# Patient Record
Sex: Female | Born: 1950 | Race: White | Hispanic: No | Marital: Single | Smoking: Never smoker
Health system: Southern US, Community
[De-identification: ages and names within clinical notes are randomized; demographics above are authoritative.]

## PROBLEM LIST (undated history)

## (undated) DIAGNOSIS — E669 Obesity, unspecified: Secondary | ICD-10-CM

## (undated) DIAGNOSIS — E785 Hyperlipidemia, unspecified: Secondary | ICD-10-CM

## (undated) DIAGNOSIS — I10 Essential (primary) hypertension: Secondary | ICD-10-CM

## (undated) DIAGNOSIS — M1990 Unspecified osteoarthritis, unspecified site: Secondary | ICD-10-CM

## (undated) DIAGNOSIS — H269 Unspecified cataract: Secondary | ICD-10-CM

## (undated) DIAGNOSIS — T7840XA Allergy, unspecified, initial encounter: Secondary | ICD-10-CM

## (undated) DIAGNOSIS — K5792 Diverticulitis of intestine, part unspecified, without perforation or abscess without bleeding: Secondary | ICD-10-CM

## (undated) DIAGNOSIS — E079 Disorder of thyroid, unspecified: Secondary | ICD-10-CM

## (undated) HISTORY — DX: Diverticulitis of intestine, part unspecified, without perforation or abscess without bleeding: K57.92

## (undated) HISTORY — PX: BREAST BIOPSY: SHX20

## (undated) HISTORY — DX: Essential (primary) hypertension: I10

## (undated) HISTORY — DX: Disorder of thyroid, unspecified: E07.9

## (undated) HISTORY — DX: Unspecified osteoarthritis, unspecified site: M19.90

## (undated) HISTORY — DX: Obesity, unspecified: E66.9

## (undated) HISTORY — DX: Allergy, unspecified, initial encounter: T78.40XA

## (undated) HISTORY — DX: Unspecified cataract: H26.9

## (undated) HISTORY — DX: Hyperlipidemia, unspecified: E78.5

---

## 1986-11-22 HISTORY — PX: BRAIN SURGERY: SHX531

## 2002-11-22 HISTORY — PX: FOOT SURGERY: SHX648

## 2005-11-22 HISTORY — PX: BIOPSY BREAST: PRO8

## 2006-11-22 HISTORY — PX: ABDOMINAL HYSTERECTOMY: SHX81

## 2009-04-01 ENCOUNTER — Ambulatory Visit: Payer: Self-pay | Admitting: Family Medicine

## 2009-04-01 DIAGNOSIS — E049 Nontoxic goiter, unspecified: Secondary | ICD-10-CM | POA: Insufficient documentation

## 2009-04-01 DIAGNOSIS — N1831 Chronic kidney disease, stage 3a: Secondary | ICD-10-CM | POA: Insufficient documentation

## 2009-04-01 DIAGNOSIS — E119 Type 2 diabetes mellitus without complications: Secondary | ICD-10-CM | POA: Insufficient documentation

## 2009-04-01 LAB — CONVERTED CEMR LAB
Albumin/Creatinine Ratio, Urine, POC: <30
Bilirubin Urine: NEGATIVE
Creatinine,U: 100 mg/dL
Microalbumin U total vol: 10 mg/L
Urobilinogen, UA: 0.2
WBC Urine, dipstick: NEGATIVE

## 2009-04-02 ENCOUNTER — Encounter: Payer: Self-pay | Admitting: Family Medicine

## 2009-04-02 LAB — CONVERTED CEMR LAB
ALT: 23 units/L (ref 0–35)
AST: 20 units/L (ref 0–37)
Albumin: 4.3 g/dL (ref 3.5–5.2)
Alkaline Phosphatase: 106 units/L (ref 39–117)
BUN: 16 mg/dL (ref 6–23)
CO2: 25 meq/L (ref 19–32)
Calcium: 9.1 mg/dL (ref 8.4–10.5)
Chloride: 104 meq/L (ref 96–112)
Cholesterol: 145 mg/dL (ref 0–200)
Creatinine, Ser: 0.63 mg/dL (ref 0.40–1.20)
Glucose, Bld: 103 mg/dL — ABNORMAL HIGH (ref 70–99)
HDL: 61 mg/dL (ref >39)
Hgb A1c MFr Bld: 6.1 % (ref 4.6–6.1)
LDL Cholesterol: 68 mg/dL (ref 0–99)
Potassium: 4.4 meq/L (ref 3.5–5.3)
Sodium: 140 meq/L (ref 135–145)
TSH: 1.604 microintl units/mL (ref 0.350–4.500)
Total Bilirubin: 0.5 mg/dL (ref 0.3–1.2)
Total CHOL/HDL Ratio: 2.4
Total Protein: 6.7 g/dL (ref 6.0–8.3)
Triglycerides: 80 mg/dL (ref <150)
VLDL: 16 mg/dL (ref 0–40)

## 2009-04-03 ENCOUNTER — Telehealth: Payer: Self-pay | Admitting: Family Medicine

## 2009-05-06 ENCOUNTER — Encounter: Admission: RE | Admit: 2009-05-06 | Discharge: 2009-05-06 | Payer: Self-pay | Admitting: Family Medicine

## 2009-10-01 ENCOUNTER — Ambulatory Visit: Payer: Self-pay | Admitting: Family Medicine

## 2009-12-03 ENCOUNTER — Ambulatory Visit: Payer: Self-pay | Admitting: Family Medicine

## 2009-12-03 DIAGNOSIS — M76899 Other specified enthesopathies of unspecified lower limb, excluding foot: Secondary | ICD-10-CM | POA: Insufficient documentation

## 2009-12-03 LAB — CONVERTED CEMR LAB: Hgb A1c MFr Bld: 6.4 %

## 2009-12-04 LAB — CONVERTED CEMR LAB: TSH: 1.093 microintl units/mL (ref 0.350–4.500)

## 2010-02-04 ENCOUNTER — Encounter: Payer: Self-pay | Admitting: Family Medicine

## 2010-04-22 ENCOUNTER — Ambulatory Visit: Payer: Self-pay | Admitting: Family Medicine

## 2010-04-22 DIAGNOSIS — Z780 Asymptomatic menopausal state: Secondary | ICD-10-CM | POA: Insufficient documentation

## 2010-04-23 ENCOUNTER — Encounter: Payer: Self-pay | Admitting: Family Medicine

## 2010-04-24 LAB — CONVERTED CEMR LAB
ALT: 19 units/L (ref 0–35)
AST: 18 units/L (ref 0–37)
Albumin: 4.4 g/dL (ref 3.5–5.2)
Alkaline Phosphatase: 103 units/L (ref 39–117)
BUN: 16 mg/dL (ref 6–23)
CO2: 26 meq/L (ref 19–32)
Calcium: 9.1 mg/dL (ref 8.4–10.5)
Chloride: 105 meq/L (ref 96–112)
Cholesterol: 157 mg/dL (ref 0–200)
Creatinine, Ser: 0.68 mg/dL (ref 0.40–1.20)
Creatinine, Urine: 117.9 mg/dL
Glucose, Bld: 120 mg/dL — ABNORMAL HIGH (ref 70–99)
HDL: 61 mg/dL (ref >39)
Hgb A1c MFr Bld: 6.5 % — ABNORMAL HIGH (ref <5.7)
LDL Cholesterol: 84 mg/dL (ref 0–99)
Microalb Creat Ratio: 7.4 mg/g (ref 0.0–30.0)
Microalb, Ur: 0.87 mg/dL (ref 0.00–1.89)
Potassium: 4.6 meq/L (ref 3.5–5.3)
Sodium: 141 meq/L (ref 135–145)
TSH: 2.207 microintl units/mL (ref 0.350–4.500)
Total Bilirubin: 0.3 mg/dL (ref 0.3–1.2)
Total CHOL/HDL Ratio: 2.6
Total Protein: 6.6 g/dL (ref 6.0–8.3)
Triglycerides: 60 mg/dL (ref <150)
VLDL: 12 mg/dL (ref 0–40)

## 2010-05-12 ENCOUNTER — Encounter: Payer: Self-pay | Admitting: Family Medicine

## 2010-05-14 ENCOUNTER — Telehealth: Payer: Self-pay | Admitting: Family Medicine

## 2010-05-15 ENCOUNTER — Ambulatory Visit: Payer: Self-pay | Admitting: Family Medicine

## 2010-05-19 ENCOUNTER — Encounter: Admission: RE | Admit: 2010-05-19 | Discharge: 2010-05-19 | Payer: Self-pay | Admitting: Family Medicine

## 2010-06-17 ENCOUNTER — Ambulatory Visit: Payer: Self-pay | Admitting: Family Medicine

## 2010-07-13 ENCOUNTER — Ambulatory Visit: Payer: Self-pay | Admitting: Family Medicine

## 2010-08-11 ENCOUNTER — Ambulatory Visit: Payer: Self-pay | Admitting: Family Medicine

## 2010-10-05 ENCOUNTER — Ambulatory Visit: Payer: Self-pay | Admitting: Family Medicine

## 2010-10-05 DIAGNOSIS — L723 Sebaceous cyst: Secondary | ICD-10-CM | POA: Insufficient documentation

## 2010-10-06 ENCOUNTER — Encounter: Payer: Self-pay | Admitting: Family Medicine

## 2010-10-27 ENCOUNTER — Ambulatory Visit: Payer: Self-pay | Admitting: Family Medicine

## 2010-11-09 ENCOUNTER — Ambulatory Visit: Payer: Self-pay | Admitting: Family Medicine

## 2010-12-10 ENCOUNTER — Telehealth: Payer: Self-pay | Admitting: Family Medicine

## 2010-12-17 ENCOUNTER — Ambulatory Visit
Admission: RE | Admit: 2010-12-17 | Discharge: 2010-12-17 | Payer: Self-pay | Source: Home / Self Care | Attending: Family Medicine | Admitting: Family Medicine

## 2010-12-17 DIAGNOSIS — I10 Essential (primary) hypertension: Secondary | ICD-10-CM | POA: Insufficient documentation

## 2010-12-17 DIAGNOSIS — M79609 Pain in unspecified limb: Secondary | ICD-10-CM | POA: Insufficient documentation

## 2010-12-17 LAB — CONVERTED CEMR LAB
Creatinine,U: 100 mg/dL
Hgb A1c MFr Bld: 6.5 %
Microalbumin U total vol: 10 mg/L

## 2010-12-22 NOTE — Letter (Signed)
Summary: [facility]   Imported By: Lanelle Bal 02/10/2010 09:01:15  _____________________________________________________________________  External Attachment:    Type:   Image     Comment:   External Document

## 2010-12-22 NOTE — Assessment & Plan Note (Signed)
Summary: WT check  Nurse Visit   Vital Signs:  Patient profile:   60 year old female Menstrual status:  hysterectomy 2008 Height:      65.25 inches Weight:      270 pounds BMI:     44.75 O2 Sat:      96 % on Room air Pulse rate:   72 / minute BP sitting:   137 / 74  (left arm) Cuff size:   large  Vitals Entered By: Payton Spark CMA (August 11, 2010 8:23 AM)  O2 Flow:  Room air CC: F/U weight and BP. Does not want phentermine refilled.   Current Medications (verified): 1)  Levothroid 100 Mcg Tabs (Levothyroxine Sodium) .Marland Kitchen.. 1 By Mouth Daily 2)  Simvastatin 20 Mg Tabs (Simvastatin) .Marland Kitchen.. 1 By Mouth Daily At Bedtime 3)  Asa 81mg  .... 1 By Mouth Daily 4)  Mv .Marland Kitchen.. 1 By Mouth Daily 5)  Calcium 600mg  .... 1 By Mouth Daily 6)  Fish Oil 1000mg  .... By Mouth Daily  Allergies (verified): No Known Drug Allergies  Orders Added: 1)  Est. Patient Level I [16109]    Impression & Recommendations:  Problem # 1:  DIABETES MELLITUS (ICD-250.00) Seconday to morbid obesity. BMI 44.  Problem # 2:  MORBID OBESITY (ICD-278.01) Total Phentermine use : 3 mos with 20 lbs lost during month 1, zero lbs lost month 2 and 5 lbs lost month 3. BP OK.  Pt did not wish to continue use.    Advise routine f/u with Dr Linford Arnold.  Complete Medication List: 1)  Levothroid 100 Mcg Tabs (Levothyroxine sodium) .Marland Kitchen.. 1 by mouth daily 2)  Simvastatin 20 Mg Tabs (Simvastatin) .Marland Kitchen.. 1 by mouth daily at bedtime 3)  Asa 81mg   .... 1 by mouth daily 4)  Mv  .Marland KitchenMarland Kitchen. 1 by mouth daily 5)  Calcium 600mg   .... 1 by mouth daily 6)  Fish Oil 1000mg   .... By mouth daily

## 2010-12-22 NOTE — Progress Notes (Signed)
Summary: normal stress echo  Phone Note Outgoing Call   Summary of Call: Marcelino Duster, Pls call pt.  Let her know that I reviewed her stress Echo from [facility] and it looks great.  Her pumping function is normal.  Valves all looked normla.  The only abnormality was some impaired relaxation of the left ventricle which is usually from HTN or Obesity.  She has the [place] light to exercise.  Our goals will be to work on improving weight and BP.   Initial call taken by: Seymour Bars DO,  May 14, 2010 7:57 AM  Follow-up for Phone Call        [facility] for [facility] Follow-up by: Payton Spark CMA,  May 14, 2010 12:44 PM     Appended Document: normal stress echo Pt aware

## 2010-12-22 NOTE — Assessment & Plan Note (Signed)
Summary: CPE w/o pap   Vital Signs:  Patient profile:   60 year old female Menstrual status:  hysterectomy 2008 Height:      65.25 inches Weight:      293 pounds BMI:     48.56 O2 Sat:      96 % on Room air Pulse rate:   76 / minute BP sitting:   133 / 84  (left arm) Cuff size:   large  Vitals Entered By: Payton Spark CMA (April 22, 2010 2:35 PM)  O2 Flow:  Room air CC: CPE w/ out pap   Primary Care Provider:  Nani Gasser, MD.   CC:  CPE w/ out pap.  History of Present Illness: 60 yo WF presents for CPE w/o pap smear.  She had a TAH at 60.  Her ovaries were removed.  Denies any vasomotor symptoms.  She had fibroids.    She is single and not sexually active.  Declined need for STD testing.  Her colonoscopy is UTD.  Her tetanus is UTD.  she is due for fasting labs along with TSH and A1C this month.  She has failed to lose wt on her own and is interested in starting a wt loss program.  Has mammogram scheduled later this month.  Last DEXA done 5 yrs ago.  Has never had cardiac testing done and has obesity, DM and high cholesterol.  Denies symptoms of CP or DOE.    Current Medications (verified): 1)  Levothroid 100 Mcg Tabs (Levothyroxine Sodium) .Marland Kitchen.. 1 By Mouth Daily 2)  Simvastatin 20 Mg Tabs (Simvastatin) .Marland Kitchen.. 1 By Mouth Daily At Bedtime 3)  Asa 81mg  .... 1 By Mouth Daily 4)  Mv .Marland Kitchen.. 1 By Mouth Daily 5)  Calcium 600mg  .... 1 By Mouth Daily 6)  Fish Oil 1000mg  .... By Mouth Daily  Allergies (verified): No Known Drug Allergies  Past History:  Past Medical History: Reviewed history from 04/01/2009 and no changes required. Goiter Diverticulitis DM since 2007  Past Surgical History: Reviewed history from 04/01/2009 and no changes required. Hysterectomy 2008- fibroids.  Left breast biopsy 2007 - benign calcification.  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 GM with  MI Mother with HTN, stroke, DM Father with kidney failure, CAD with CABG at 32  Social History: Reviewed history from 04/01/2009 and no changes required. 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   Review of Systems       The patient complains of weight gain.  The patient denies anorexia, fever, weight loss, vision loss, decreased hearing, hoarseness, chest pain, syncope, dyspnea on exertion, peripheral edema, prolonged cough, headaches, hemoptysis, abdominal pain, melena, hematochezia, severe indigestion/heartburn, hematuria, incontinence, genital sores, muscle weakness, suspicious skin lesions, transient blindness, difficulty walking, depression, unusual weight change, abnormal bleeding, enlarged lymph nodes, angioedema, breast masses, and testicular masses.    Physical Exam  General:  morbidly obese WF in NAD Head:  normocephalic and atraumatic.   Eyes:  pupils equal, pupils round, and pupils reactive to light.   Ears:  EACs patent; TMs translucent and gray with good cone of light and bony landmarks.  Nose:  no nasal discharge.   Mouth:  good dentition and pharynx pink and moist.   Neck:  no masses.   Breasts:  No mass, nodules, thickening, tenderness, bulging, retraction, inflamation, nipple discharge or skin  changes noted.   Lungs:  Normal respiratory effort, chest expands symmetrically. Lungs are clear to auscultation, no crackles or wheezes. Heart:  Normal rate and regular rhythm. S1 and S2 normal without gallop, murmur, click, rub or other extra sounds. Abdomen:  Bowel sounds positive,abdomen soft and non-tender without masses, organomegaly or hernias noted. Msk:  no joint tenderness, no joint swelling, and no joint warmth.   Pulses:  2+ radial and pedal pulses Extremities:  1+ lipidemia bilat LEs Skin:  color normal.   Cervical Nodes:  No lymphadenopathy noted Psych:  good eye  contact, not anxious appearing, and not depressed appearing.     Impression & Recommendations:  Problem # 1:  HEALTH MAINTENANCE EXAM (ICD-V70.0) Keeping healthy checklist for women reviewed. BP at goal.  BMI 48 c/w class III obesity. Update fasting labs. Update DEXA with mammogram this month. MVI + calcium/ D daily recommended along with healthy diet, regular exercise, wt loss. Set up Stress Echo given DM, morbid obesity, hyperlipidemia in the setting of initating wt loss program. Colonoscopy and Tetanus UTD. RTC for wt managment in 3 wks.  Complete Medication List: 1)  Levothroid 100 Mcg Tabs (Levothyroxine sodium) .Marland Kitchen.. 1 by mouth daily 2)  Simvastatin 20 Mg Tabs (Simvastatin) .Marland Kitchen.. 1 by mouth daily at bedtime 3)  Asa 81mg   .... 1 by mouth daily 4)  Mv  .Marland KitchenMarland Kitchen. 1 by mouth daily 5)  Calcium 600mg   .... 1 by mouth daily 6)  Fish Oil 1000mg   .... By mouth daily  Other Orders: T-Stress 2-D Echo (16109) T-Comprehensive Metabolic Panel [id number]) T-Lipid Profile ([id number]) T-TSH [id number]) T-Hemoglobin A1C (96295) T-Urine Microalbumin w/creat. ratio [phone]) T-DXA Bone Density/ Appendicular (53664) T-Dual DXA Bone Density/ Axial (40347)  Patient Instructions: 1)  Update fasting labs. 2)  Will call you with the results. 3)  Will schedule Stress Echo at [facility] cardiology. 4)  Return to see Dr Cathey Endow for Weight managment in 3 wks. Prescriptions: SIMVASTATIN 20 MG TABS (SIMVASTATIN) 1 by mouth daily at bedtime  #90 x 1   Entered and Authorized by:   Seymour Bars DO   Signed by:   Seymour Bars DO on 04/22/2010   Method used:   Electronically to        [facility] # [phone]* (retail)       [address]       [place], [place]  [address]       Ph: [phone]       Fax: [phone]   RxID:   [id number] LEVOTHROID 100 MCG TABS (LEVOTHYROXINE SODIUM) 1 by mouth daily  #90 x 1   Entered and Authorized by:   Seymour Bars DO   Signed by:    Seymour Bars DO on 04/22/2010   Method used:   Electronically to        [facility] # [phone]* (retail)       [address]       [place], [place]  [address]       Ph: [phone]       Fax: [phone]   RxID:   [id number]

## 2010-12-22 NOTE — Assessment & Plan Note (Signed)
Summary: f/u WT   Vital Signs:  Patient profile:   60 year old female Menstrual status:  hysterectomy 2008 Height:      65.25 inches Weight:      275 pounds BMI:     45.58 O2 Sat:      98 % on Room air Pulse rate:   75 / minute BP sitting:   138 / 75  (left arm) Cuff size:   large  Vitals Entered By: Payton Spark CMA (July 13, 2010 8:17 AM)  O2 Flow:  Room air CC: F/U weight and BP   Primary Care Provider:  Nani Gasser, MD.   CC:  F/U weight and BP.  History of Present Illness: 60 yo WF presents for f/u WT/ BP after the 2nd month on Phentermine.  The first month, she lost 20 lbs.  The 2nd month, she lost zero.    She denies adverse SE from the meds.  She still feels like it is helping curb her appetite but she notes not being 'as strict' with her diet and has not been exercising regularly.    Allergies: No Known Drug Allergies  Past History:  Past Medical History: Reviewed history from 06/17/2010 and no changes required. Goiter Diverticulitis DM since 2007 obesity (BMI >40) hyperlipidemia  Social History: Reviewed history from 04/01/2009 and no changes required. 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   Review of Systems      See HPI  Physical Exam  General:  alert, well-developed, well-nourished, and well-hydrated.  obese Psych:  good eye contact, not anxious appearing, and not depressed appearing.     Impression & Recommendations:  Problem # 1:  DIABETES MELLITUS (ICD-250.00)  Labs Reviewed: Creat: 0.68 (04/23/2010)   Microalbumin: 10 (04/01/2009)  Last Eye Exam: normal (02/04/2010) Reviewed HgBA1c results: 6.5 (04/23/2010)  6.4 (12/03/2009)  Problem # 2:  MORBID OBESITY (ICD-278.01) BMI 45.  On Phentermine x 2 months with 20 lbs lost the first month and zero for month 2. We discussed options to 1) stop Phentermine 2) retry for another month  along with using a food diary and sticking to 1400 kcal/ day or less with focus on veggies and lean proteins.  She is also to work on getting more exercise -- she is looking into [employer].  Needs 45-60 min 5 days/ wk.  Recheck in 1 month.  If < 4 lbs lost, will d/c Phentermine.  Complete Medication List: 1)  Levothroid 100 Mcg Tabs (Levothyroxine sodium) .Marland Kitchen.. 1 by mouth daily 2)  Simvastatin 20 Mg Tabs (Simvastatin) .Marland Kitchen.. 1 by mouth daily at bedtime 3)  Asa 81mg   .... 1 by mouth daily 4)  Mv  .Marland KitchenMarland Kitchen. 1 by mouth daily 5)  Calcium 600mg   .... 1 by mouth daily 6)  Fish Oil 1000mg   .... By mouth daily 7)  Phentermine Hcl 37.5 Mg Tabs (Phentermine hcl) .Marland Kitchen.. 1 tab by mouth qam, take 30 min before breakfast  Patient Instructions: 1)  Start a food diary. 2)  Aim for 1400 kcal/ day. 3)  Stick to high protein, low carb and plenty of veggies. 4)  Aim for 45+ min of exercise 5 days/ wk.   5)  Stay on Phenermine in the morning, 30 min before breakfast. 6)  REturn for a nurse visit in 1 month. Prescriptions: PHENTERMINE HCL 37.5 MG TABS (PHENTERMINE HCL) 1 tab by mouth qAM, take 30 min before breakfast  #  30 x 0   Entered and Authorized by:   Seymour Bars DO   Signed by:   Seymour Bars DO on 07/13/2010   Method used:   Print then Give to Patient   RxID:   [id number]

## 2010-12-22 NOTE — Assessment & Plan Note (Signed)
Summary: f/u WT   Vital Signs:  Patient profile:   60 year old female Menstrual status:  hysterectomy 2008 Height:      65.25 inches Weight:      275 pounds BMI:     45.58 O2 Sat:      97 % on Room air Pulse rate:   71 / minute BP sitting:   124 / 78  (left arm) Cuff size:   large  Vitals Entered By: Payton Spark CMA (June 17, 2010 8:47 AM)  O2 Flow:  Room air CC: F/U weight.    Primary Care Provider:  Nani Gasser, MD.   CC:  F/U weight. .  History of Present Illness: 60 yo WF presents for f/u weight management.  She was started Phentermine 1 month ago and has done well on it w/o palpitations, tremor, insomnia.  She is walking more and doing the exercise bike more often.  She notes that she has done better with portion control.  She has lost 21 lbs in 1 month.  Her co- morbidities include diet - controlled T2DM and hyperlipidemia.  Labs are UTD.  Due for an EKG today.      Current Medications (verified): 1)  Levothroid 100 Mcg Tabs (Levothyroxine Sodium) .Marland Kitchen.. 1 By Mouth Daily 2)  Simvastatin 20 Mg Tabs (Simvastatin) .Marland Kitchen.. 1 By Mouth Daily At Bedtime 3)  Asa 81mg  .... 1 By Mouth Daily 4)  Mv .Marland Kitchen.. 1 By Mouth Daily 5)  Calcium 600mg  .... 1 By Mouth Daily 6)  Fish Oil 1000mg  .... By Mouth Daily 7)  Phentermine Hcl 37.5 Mg Tabs (Phentermine Hcl) .Marland Kitchen.. 1 Tab By Mouth Qam, Take 30 Min Before Breakfast  Allergies (verified): No Known Drug Allergies  Past History:  Past Medical History: Goiter Diverticulitis DM since 2007 obesity (BMI >40) hyperlipidemia  Social History: Reviewed history from 04/01/2009 and no changes required. 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   Review of Systems      See HPI  Physical Exam  General:  alert, well-developed, well-nourished, and well-hydrated.  obese Head:  normocephalic and atraumatic.   Mouth:  pharynx pink and moist.     Neck:  no masses.   Lungs:  Normal respiratory effort, chest expands symmetrically. Lungs are clear to auscultation, no crackles or wheezes. Heart:  Normal rate and regular rhythm. S1 and S2 normal without gallop, murmur, click, rub or other extra sounds. Neurologic:  no tremor Skin:  color normal.   Cervical Nodes:  No lymphadenopathy noted Psych:  good eye contact, not anxious appearing, and not depressed appearing.     Impression & Recommendations:  Problem # 1:  MORBID OBESITY (ICD-278.01) BMI improved from 49--> 45.  Weight lost on first month of Phentermine: 21 lbs.  Doing great!  No adverse SE's.  EKG done today to check QT interval:  Normal QTc at 433 msec.  Normal axis with possible ectopic atrial rhythm.   May need cardiology referral down the road. BP / HR are normal.  Continue Phentermine for another month at same dose.   Continue health diet - high protein, low carb, increased veggies.  Will work on increasing her exercise to 40+ min 4-5 days/ wk.   RTC in 1 month for a BP/ WT check.    Problem # 2:  DIABETES MELLITUS (ICD-250.00)  Labs Reviewed: Creat: 0.68 (04/23/2010)   Microalbumin: 10 (04/01/2009)  Last Eye Exam:  normal (02/04/2010) Reviewed HgBA1c results: 6.5 (04/23/2010)  6.4 (12/03/2009)  Complete Medication List: 1)  Levothroid 100 Mcg Tabs (Levothyroxine sodium) .Marland Kitchen.. 1 by mouth daily 2)  Simvastatin 20 Mg Tabs (Simvastatin) .Marland Kitchen.. 1 by mouth daily at bedtime 3)  Asa 81mg   .... 1 by mouth daily 4)  Mv  .Marland KitchenMarland Kitchen. 1 by mouth daily 5)  Calcium 600mg   .... 1 by mouth daily 6)  Fish Oil 1000mg   .... By mouth daily 7)  Phentermine Hcl 37.5 Mg Tabs (Phentermine hcl) .Marland Kitchen.. 1 tab by mouth qam, take 30 min before breakfast  Other Orders: EKG w/ Interpretation (93000)  Patient Instructions: 1)  Return in 1 month for a nurse visit - BP/ WT check. Prescriptions: PHENTERMINE HCL 37.5 MG TABS (PHENTERMINE HCL) 1 tab by mouth qAM, take 30 min before breakfast  #30 x 0    Entered and Authorized by:   Seymour Bars DO   Signed by:   Seymour Bars DO on 06/17/2010   Method used:   Printed then faxed to ...       [facility]  [address] # [phone]* (retail)       [phone]       [place], [place]  [address]       Ph: [phone]       Fax: [phone]   RxID:   [id number]

## 2010-12-22 NOTE — Assessment & Plan Note (Signed)
Summary: Cyst on her back - jr   Vital Signs:  Patient profile:   60 year old female Menstrual status:  hysterectomy 2008 Height:      65.25 inches Weight:      277 pounds Pulse rate:   65 / minute BP sitting:   143 / 79  (right arm) Cuff size:   large  Vitals Entered By: Avon Gully CMA, Duncan Dull) (October 27, 2010 1:55 PM) CC: Cyst on the back to be removed   CC:  Cyst on the back to be removed.  Current Medications (verified): 1)  Levothroid 100 Mcg Tabs (Levothyroxine Sodium) .Marland Kitchen.. 1 By Mouth Daily 2)  Simvastatin 20 Mg Tabs (Simvastatin) .Marland Kitchen.. 1 By Mouth Daily At Bedtime 3)  Asa 81mg  .... 1 By Mouth Daily 4)  Mv .Marland Kitchen.. 1 By Mouth Daily 5)  Calcium 600mg  .... 1 By Mouth Daily 6)  Fish Oil 1000mg  .... By Mouth Daily  Allergies (verified): No Known Drug Allergies  Comments:  Nurse/Medical Assistant: The patient's medications and allergies were reviewed with the patient and were updated in the Medication and Allergy Lists. Avon Gully CMA, Duncan Dull) (October 27, 2010 1:56 PM)   Complete Medication List: 1)  Levothroid 100 Mcg Tabs (Levothyroxine sodium) .Marland Kitchen.. 1 by mouth daily 2)  Simvastatin 20 Mg Tabs (Simvastatin) .Marland Kitchen.. 1 by mouth daily at bedtime 3)  Asa 81mg   .... 1 by mouth daily 4)  Mv  .Marland KitchenMarland Kitchen. 1 by mouth daily 5)  Calcium 600mg   .... 1 by mouth daily 6)  Fish Oil 1000mg   .... By mouth daily  Other Orders: Excise lesion (TAL) 0.6 - 1.0 (11401)  Patient Instructions: 1)  Coat the wound with vaseline or neosporin before covering.  Followup in 2 weeks for suture removal.   2)  Call if any concerns for infection, redness or drainage.    Orders Added: 1)  Excise lesion (TAL) 0.6 - 1.0 [11401]    Procedure Note Last Tetanus: Historical (11/22/2006)  Cyst Removal: Indication: changing lesion  Procedure #1: elliptical incision and removal w/blunt dissection    Size (in cm): 1.0 x 1.0    Region: right upper back    Instrument used: #10 blade  Anesthesia: 1% lidocaine w/epinephrine    Closure: simple interrupted    Superficial Suture: 3-0 Ethilon       # of superficial sutures: 3  Cleaned and prepped with: alcohol and betadine Wound dressing: vaseline and bulky gauze dressing Instructions: RTC in 10-14 days  Biopsy:  Procedure # 1: shave biopsy    Size (in cm): 1.0 x 1.0    Region: right upper back    Comment: sebaceous cyst removal

## 2010-12-22 NOTE — Assessment & Plan Note (Signed)
Summary: Fu DM, leg pain, thyroid   Vital Signs:  Patient profile:   60 year old female Menstrual status:  hysterectomy 2008 Height:      65.25 inches Weight:      293 pounds Pulse rate:   69 / minute BP sitting:   134 / 73  (left arm) Cuff size:   large  Vitals Entered By: Kathlene November (December 03, 2009 9:45 AM) CC: follow-up blood sugars. FBS at home this morning was 124 Is Patient Diabetic? Yes   Primary Care Provider:  Nani Gasser, MD.   CC:  follow-up blood sugars. FBS at home this morning was 124.  History of Present Illness: follow-up blood sugars. FBS at home this morning was 124. Weight is up 5 pounds from last time. She is primarly diet controlled.    Numbness on the right outer thigh for severaly years but lately feel smore "creepy crawly" and then can be painful. Pain can last for several daysand then may go away. No aggrevating factrs.  Has been wearing the tone-up sketchers and occ wears merrels.  Occ uses Aleve - does help some.   Diabetes Management History:      The patient is a 60 years old female who comes in for evaluation of DM Type 2.  She says that she is not exercising regularly.        Frequency of hypoglycemic symptoms are reported to be seldom.  Other comments include: Occ will feel dizzy and then eat a peice of candy  and then will feel better.  .        There are no symptoms to suggest diabetic complications.  Since her last visit, no infections have occurred.  No changes have been made to her treatment plan since last visit.        Her home blood sugars include fasting blood sugars: highest: 150; 4:00 PM blood sugars: lowest: 109.    Habits & Providers  Exercise-Depression-Behavior     Type of exercise: None  Current Medications (verified): 1)  Levothroid 100 Mcg Tabs (Levothyroxine Sodium) .Marland Kitchen.. 1 By Mouth Daily 2)  Simvastatin 20 Mg Tabs (Simvastatin) .Marland Kitchen.. 1 By Mouth Daily At Bedtime 3)  Asa 81mg  .... 1 By Mouth Daily 4)  Mv .Marland Kitchen.. 1 By  Mouth Daily 5)  Calcium 600mg  .... 1 By Mouth Daily 6)  Fish Oil 1000mg  .... By Mouth Daily  Allergies (verified): No Known Drug Allergies  Comments:  Nurse/Medical Assistant: The patient's medications and allergies were reviewed with the patient and were updated in the Medication and Allergy Lists. Kathlene November (December 03, 2009 9:46 AM)  Physical Exam  General:  Well-developed,well-nourished,in no acute distress; alert,appropriate and cooperative throughout examination Lungs:  Normal respiratory effort, chest expands symmetrically. Lungs are clear to auscultation, no crackles or wheezes. Heart:  Normal rate and regular rhythm. S1 and S2 normal without gallop, murmur, click, rub or other extra sounds. Msk:  TEnder over teh left greater trochanter. weakness in teh adductor as well. Tender down the lateral thigh mostly closer to the hip.  Neurologic:  Patellar reflexes 2+_  Skin:  no rashes.   Psych:  Cognition and judgment appear intact. Alert and cooperative with normal attention span and concentration. No apparent delusions, illusions, hallucinations   Impression & Recommendations:  Problem # 1:  DIABETES MELLITUS (ICD-250.00)  A1C is still well controlled but up a little from last year. Discussed the importance of regular exercise and weight loss. She has gained 5 lbs  since last OV>  Reminded to get her eye exam this year.  F/ u in 6 months.  Will be due for repat labs at this time.   Orders: Fingerstick (36416) Hemoglobin A1C (83036)  Labs Reviewed: Creat: 0.63 (04/02/2009)   Microalbumin: 10 (04/01/2009) Reviewed HgBA1c results: 6.4 (12/03/2009)  6.1 (04/02/2009)  Problem # 2:  TROCHANTERIC BURSITIS, RIGHT (ICD-726.5) Assessment: New Discussed exercises to rehab the area for at least 3 weeks. If not helping then let me know. If needs to use a pain reliever can use her Aleve since it does seem to help. Make sure wearing supportive shoes.    Problem # 3:  HYPOTHYROIDISM  (ICD-244.9) Due to check today.  No complaints of symptoms of her thyroid.   Her updated medication list for this problem includes:    Levothroid 100 Mcg Tabs (Levothyroxine sodium) .Marland Kitchen... 1 by mouth daily  Orders: T-TSH ([id number])  Complete Medication List: 1)  Levothroid 100 Mcg Tabs (Levothyroxine sodium) .Marland Kitchen.. 1 by mouth daily 2)  Simvastatin 20 Mg Tabs (Simvastatin) .Marland Kitchen.. 1 by mouth daily at bedtime 3)  Asa 81mg   .... 1 by mouth daily 4)  Mv  .Marland KitchenMarland Kitchen. 1 by mouth daily 5)  Calcium 600mg   .... 1 by mouth daily 6)  Fish Oil 1000mg   .... By mouth daily  Laboratory Results   Blood Tests   Date/Time Received: 12/03/2009 Date/Time Reported: 12/03/2009  HGBA1C: 6.4%   (Normal Range: Non-Diabetic - 3-6%   Control Diabetic - 6-8%)        Appended Document: Fu DM, leg pain, thyroid    Diabetes Management History:      The patient is a 60 years old female who comes in for evaluation of DM Type 2.  She says that she is not exercising regularly.         Diabetes Management Exam:    Eye Exam:       Eye Exam done elsewhere          Date: 02/04/2010          Results: normal          Done by: [facility]

## 2010-12-22 NOTE — Assessment & Plan Note (Signed)
Summary: FLU SHOT  Nurse Visit  Flu Vaccine Consent Questions     Do you have a history of severe allergic reactions to this vaccine? no    Any prior history of allergic reactions to egg and/or gelatin? no    Do you have a sensitivity to the preservative Thimersol? no    Do you have a past history of Guillan-Barre Syndrome? no    Do you currently have an acute febrile illness? no    Have you ever had a severe reaction to latex? no    Vaccine information given and explained to patient? yes    Are you currently pregnant? no    Lot Number:[id number]   Exp Date:05/21/2010   Site Given  Left Deltoid IM  Allergies: No Known Drug Allergies  Orders Added: 1)  Admin 1st Vaccine [90471] 2)  Flu Vaccine 51yrs + [40981]

## 2010-12-22 NOTE — Assessment & Plan Note (Signed)
Summary: weight mgmt   Vital Signs:  Patient profile:   60 year old female Menstrual status:  hysterectomy 2008 Height:      65.25 inches Weight:      296 pounds BMI:     49.06 O2 Sat:      96 % on Room air Pulse rate:   64 / minute BP sitting:   134 / 71  (left arm) Cuff size:   large  Vitals Entered By: Payton Spark CMA (May 15, 2010 8:41 AM)  O2 Flow:  Room air CC: F/U weight management and Blood pressure.   Primary Care Provider:  Nani Gasser, MD.   CC:  F/U weight management and Blood pressure.Marland Kitchen  History of Present Illness: 60 yo WF present for f/u obesity.  She has co- morbididities of T2DM and hyperlipidemia.  She has likes sweets.  She does eat breakfast.  She is a late night snacker.  She feels hungry all the time.  She has tried [employer] and failed in the past.  She has failed to keep up with a walking schedule.    She is at her max weight. She gained 40 lbs with menopause. She works for [employer].  Stands a lot at work (works at [employer]).  She sleeps well at night.      Current Medications (verified): 1)  Levothroid 100 Mcg Tabs (Levothyroxine Sodium) .Marland Kitchen.. 1 By Mouth Daily 2)  Simvastatin 20 Mg Tabs (Simvastatin) .Marland Kitchen.. 1 By Mouth Daily At Bedtime 3)  Asa 81mg  .... 1 By Mouth Daily 4)  Mv .Marland Kitchen.. 1 By Mouth Daily 5)  Calcium 600mg  .... 1 By Mouth Daily 6)  Fish Oil 1000mg  .... By Mouth Daily  Allergies (verified): No Known Drug Allergies  Past History:  Past Medical History: Reviewed history from 04/01/2009 and no changes required. Goiter Diverticulitis DM since 2007  Social History: Reviewed history from 04/01/2009 and no changes required. 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   Review of Systems      See HPI  Physical Exam  General:  morbidly obese WF in NAD Skin:  color normal.     Impression & Recommendations:  Problem # 1:   MORBID OBESITY (ICD-278.01) BMI 49 c/w class III + obesity complicated by T2DM. Has failed to lose wt on weight watchers or fad diets.  H/O given from [facility] on healthy dietary guideline and I have given her exercise instructions.  Encouraged meal planning and a food diary to try to stay at 1600 kcal/day.  Due to large appetite and snacking, will start Phentermine each AM.  Discussed common SEs.  Call if any problems. BP/ HR normal and just had a normal stress echo.  Will f/u with me in 1 month for OV and to do an EKG.  Problem # 2:  DIABETES MELLITUS (ICD-250.00) Diet controlled. Labs Reviewed: Creat: 0.68 (04/23/2010)   Microalbumin: 10 (04/01/2009)  Last Eye Exam: normal (02/04/2010) Reviewed HgBA1c results: 6.5 (04/23/2010)  6.4 (12/03/2009)  Complete Medication List: 1)  Levothroid 100 Mcg Tabs (Levothyroxine sodium) .Marland Kitchen.. 1 by mouth daily 2)  Simvastatin 20 Mg Tabs (Simvastatin) .Marland Kitchen.. 1 by mouth daily at bedtime 3)  Asa 81mg   .... 1 by mouth daily 4)  Mv  .Marland KitchenMarland Kitchen. 1 by mouth daily 5)  Calcium 600mg   .... 1 by mouth daily 6)  Fish Oil 1000mg   .... By mouth daily 7)  Phentermine Hcl  37.5 Mg Tabs (Phentermine hcl) .Marland Kitchen.. 1 tab by mouth qam, take 30 min before breakfast  Patient Instructions: 1)  Start Phentermine each morning, 30 min before breakfast. 2)  Work on healthy diet  ~1600 cal/ day + 30+ min of exercise 5 days/ wk. 3)  Call if any problems. 4)  Return for follow up with me in 1 month, with EKG. Prescriptions: PHENTERMINE HCL 37.5 MG TABS (PHENTERMINE HCL) 1 tab by mouth qAM, take 30 min before breakfast  #30 x 0   Entered and Authorized by:   Seymour Bars DO   Signed by:   Seymour Bars DO on 05/15/2010   Method used:   Printed then faxed to ...       [facility]  [address] # [phone]* (retail)       [phone]       [place], [place]  [address]       Ph: [phone]       Fax: [phone]   RxID:   [id number]

## 2010-12-22 NOTE — Assessment & Plan Note (Signed)
Summary: SEbaceous cyst   Vital Signs:  Patient profile:   60 year old female Menstrual status:  hysterectomy 2008 Height:      65.25 inches Weight:      276 pounds Pulse rate:   67 / minute BP sitting:   144 / 76  (right arm) Cuff size:   large  Vitals Entered By: Avon Gully CMA, Duncan Dull) (October 05, 2010 10:06 AM) CC: possible cyst on the back, has been draining   Primary Care Provider:  Nani Gasser, MD.   CC:  possible cyst on the back and has been draining.  History of Present Illness: possible cyst on the back, has been draining. Says has beeen there for years and her sister keeps an eye on it. Says one day last week the area felt very sore and started draining spontaneously. She doesn't remeber any trauma. NO fevers.    Current Medications (verified): 1)  Levothroid 100 Mcg Tabs (Levothyroxine Sodium) .Marland Kitchen.. 1 By Mouth Daily 2)  Simvastatin 20 Mg Tabs (Simvastatin) .Marland Kitchen.. 1 By Mouth Daily At Bedtime 3)  Asa 81mg  .... 1 By Mouth Daily 4)  Mv .Marland Kitchen.. 1 By Mouth Daily 5)  Calcium 600mg  .... 1 By Mouth Daily 6)  Fish Oil 1000mg  .... By Mouth Daily  Allergies (verified): No Known Drug Allergies  Comments:  Nurse/Medical Assistant: The patient's medications and allergies were reviewed with the patient and were updated in the Medication and Allergy Lists. Avon Gully CMA, Duncan Dull) (October 05, 2010 10:07 AM)  Physical Exam  General:  Well-developed,well-nourished,in no acute distress; alert,appropriate and cooperative throughout examination Skin:  Sebeceous cyst on her right upper back.  No erythema around the lesion but it is drainaging yellow pus.  Swabbed and send for culture.    Impression & Recommendations:  Problem # 1:  SEBACEOUS CYST, INFECTED (ICD-706.2)  Will send a culture. IT is odd for it to spontaneously drian. discussed options for removal but will await culture. Will need course of ABX before removal if truly infected.  Will hold off on ABX  for now since no erythema around hte lesion.   Orders: T-Culture, Wound ([id number])  Complete Medication List: 1)  Levothroid 100 Mcg Tabs (Levothyroxine sodium) .Marland Kitchen.. 1 by mouth daily 2)  Simvastatin 20 Mg Tabs (Simvastatin) .Marland Kitchen.. 1 by mouth daily at bedtime 3)  Asa 81mg   .... 1 by mouth daily 4)  Mv  .Marland KitchenMarland Kitchen. 1 by mouth daily 5)  Calcium 600mg   .... 1 by mouth daily 6)  Fish Oil 1000mg   .... By mouth daily  Patient Instructions: 1)  We will call you with the culture results later this week. Please call sooner if sxs are worsening.  2)  Consider options for removal.    Orders Added: 1)  Est. Patient Level III [16109] 2)  T-Culture, Wound [[id number]]

## 2010-12-24 ENCOUNTER — Ambulatory Visit: Payer: Self-pay | Admitting: Family Medicine

## 2010-12-24 NOTE — Progress Notes (Signed)
Summary: Hgb A1C order  Phone Note Call from Patient Call back at Home Phone [phone]   Reason for Call: Lab or Test Results Summary of Call: pt needs orders for Hgb A1C, last checked in 04/2010.  Please call pt when ready. Initial call taken by: Francee Piccolo CMA Duncan Dull),  December 10, 2010 1:37 PM  Follow-up for Phone Call        REally needs an appt for diabetic f/u as we need check her feet, do a urine micro, etc. Tell her we usually do a finger stick for the A1C.  Follow-up by: Nani Gasser MD,  December 10, 2010 1:41 PM  Additional Follow-up for Phone Call Additional follow up Details #1::        left message for pt Additional Follow-up by: Avon Gully CMA, Duncan Dull),  December 10, 2010 2:06 PM

## 2010-12-24 NOTE — Assessment & Plan Note (Signed)
Summary: STITCHES REMOVED FROM BACK AREA HAD CYST REMOVED   Vital Signs:  Patient profile:   60 year old female Menstrual status:  hysterectomy 2008 Height:      65.25 inches Weight:      276 pounds Pulse rate:   54 / minute BP sitting:   134 / 78  (right arm) Cuff size:   large  Vitals Entered By: Avon Gully CMA, Duncan Dull) (November 09, 2010 10:51 AM) CC: suture removal  mid upper back   Primary Care Provider:  Nani Gasser, MD.   CC:  suture removal  mid upper back.  History of Present Illness: Healing well. No drainage. Has been itchy.  Has been getting her sister to dress it.     Current Medications (verified): 1)  Levothroid 100 Mcg Tabs (Levothyroxine Sodium) .Marland Kitchen.. 1 By Mouth Daily 2)  Simvastatin 20 Mg Tabs (Simvastatin) .Marland Kitchen.. 1 By Mouth Daily At Bedtime 3)  Asa 81mg  .... 1 By Mouth Daily 4)  Mv .Marland Kitchen.. 1 By Mouth Daily 5)  Calcium 600mg  .... 1 By Mouth Daily 6)  Fish Oil 1000mg  .... By Mouth Daily  Allergies (verified): No Known Drug Allergies  Comments:  Nurse/Medical Assistant: The patient's medications and allergies were reviewed with the patient and were updated in the Medication and Allergy Lists. Avon Gully CMA, Duncan Dull) (November 09, 2010 10:52 AM)  Physical Exam  General:  Well-developed,well-nourished,in no acute distress; alert,appropriate and cooperative throughout examination Skin:  Incision is healing very well. Some erythematous papules around the edce of the lesion where her bandadge.     Impression & Recommendations:  Problem # 1:  SEBACEOUS CYST, INFECTED (ICD-706.2)  Healing well. Avoid using her bandages since she is clearly having an allergic reaction.    Orders: No Charge Patient Arrived (NCPA0) (NCPA0)  Complete Medication List: 1)  Levothroid 100 Mcg Tabs (Levothyroxine sodium) .Marland Kitchen.. 1 by mouth daily 2)  Simvastatin 20 Mg Tabs (Simvastatin) .Marland Kitchen.. 1 by mouth daily at bedtime 3)  Asa 81mg   .... 1 by mouth daily 4)  Mv   .Marland KitchenMarland Kitchen. 1 by mouth daily 5)  Calcium 600mg   .... 1 by mouth daily 6)  Fish Oil 1000mg   .... By mouth daily  Patient Instructions: 1)   Call if have any problems.  2)  Avoid wearing  bandage since gettting an allergic reaction.    Orders Added: 1)  No Charge Patient Arrived (NCPA0) [NCPA0]

## 2010-12-24 NOTE — Assessment & Plan Note (Signed)
Summary: DM, HTN, foot pain   Vital Signs:  Patient profile:   60 year old female Menstrual status:  hysterectomy 2008 Height:      65.25 inches Weight:      282 pounds Pulse rate:   86 / minute BP sitting:   144 / 75  (right arm) Cuff size:   large  Vitals Entered By: Avon Gully CMA, Duncan Dull) (December 17, 2010 8:51 AM) CC: Diabetic work up   Primary Care Provider:  Nani Gasser, MD.   CC:  Diabetic work up.  History of Present Illness: Weight is up 6 lbs. Here to f/u DM. Not exercising regular. Admist her diet hasn't been great over the Holidays and with her shift work.   Left heel and medial ankle pain for 3 weeks. Started after she started wearing the shoes that are supposed to tone your legs. Says it starts in the heel and radiates to her medial malleolus. No trauma or known injury. No known old injuries. Has had plantar fascititis before.   Diabetes Management History:      The patient is a 60 years old female who comes in for evaluation of Type 2 Diabetes Mellitus.  She states understanding of dietary principles but she is not following the appropriate diet.  No sensory loss is reported.  Self foot exams are being performed.  She is checking home blood sugars.  She says that she is not exercising regularly.        Hypoglycemic symptoms are not occurring.  No hyperglycemic symptoms are reported.  Other comments include: Admists has slipped up with her diet. .        There are no symptoms to suggest diabetic complications.  Since her last visit, no infections have occurred.  No changes have been made to her treatment plan since last visit.    Current Medications (verified): 1)  Levothroid 100 Mcg Tabs (Levothyroxine Sodium) .Marland Kitchen.. 1 By Mouth Daily 2)  Simvastatin 20 Mg Tabs (Simvastatin) .Marland Kitchen.. 1 By Mouth Daily At Bedtime 3)  Asa 81mg  .... 1 By Mouth Daily 4)  Mv .Marland Kitchen.. 1 By Mouth Daily 5)  Calcium 600mg  .... 1 By Mouth Daily 6)  Fish Oil 1000mg  .... By Mouth  Daily  Allergies (verified): No Known Drug Allergies  Comments:  Nurse/Medical Assistant: The patient's medications and allergies were reviewed with the patient and were updated in the Medication and Allergy Lists. Avon Gully CMA, Duncan Dull) (December 17, 2010 8:52 AM)  Social History: Reviewed history from 04/01/2009 and no changes required. 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   Physical Exam  General:  Well-developed,well-nourished,in no acute distress; alert,appropriate and cooperative throughout examinationwell-nourished.   Lungs:  Normal respiratory effort, chest expands symmetrically. Lungs are clear to auscultation, no crackles or wheezes. Heart:  Normal rate and regular rhythm. S1 and S2 normal without gallop, murmur, click, rub or other extra sounds. Msk:  Left ankle with NROM. Sterngth 5/5.  She is nontender over her heel or medial malleolus. NO swelling or redness.  Great toe rom and strength is normal.  Extremities:  DP pulse on left is normal.  Skin:  no rashes.    Diabetes Management Exam:    Foot Exam (with socks and/or shoes not present):       Sensory-Monofilament:          Left foot: normal  Right foot: normal   Impression & Recommendations:  Problem # 1:  DIABETES MELLITUS (ICD-250.00) AT goal but she has gained weight. She says she is trying to get back to eating more healthy Her foot pain has kept her from exercises.  F/U in 3 months. Work on regular exercise.   Neg microalbumin. Reminded her due for eye exam IN March.  Orders: Fingerstick (16109) Creatinine  (60454) Hemoglobin A1C (83036) Urine Microalbumin (09811)  Problem # 2:  HYPERTENSION, BENIGN (ICD-401.1) Assessment: New Discussed new dx.  Start diuretic. Warned of potential SE.  F/U in 6 weeks. Check BMP at that time.  Her updated medication list for this problem includes:     Hydrochlorothiazide 25 Mg Tabs (Hydrochlorothiazide) .Marland Kitchen... Take 1 tablet by mouth once a day  Problem # 3:  FOOT PAIN (ICD-729.5) Assessment: New I suspect secondary to her change in shoe wear. Stop wearing the tennis shoes with the rocker bottoms and wear goo supportive shoes. If not better in 3 weeks then call the office and consider eval with sports med for possible orthotics.   Complete Medication List: 1)  Levothroid 100 Mcg Tabs (Levothyroxine sodium) .Marland Kitchen.. 1 by mouth daily 2)  Simvastatin 20 Mg Tabs (Simvastatin) .Marland Kitchen.. 1 by mouth daily at bedtime 3)  Asa 81mg   .... 1 by mouth daily 4)  Mv  .Marland KitchenMarland Kitchen. 1 by mouth daily 5)  Calcium 600mg   .... 1 by mouth daily 6)  Fish Oil 1000mg   .... By mouth daily 7)  Hydrochlorothiazide 25 Mg Tabs (Hydrochlorothiazide) .... Take 1 tablet by mouth once a day   Patient Instructions: 1)  Follow up in 6 weeks for blood pressure.  2)  Call me if you would a referral to further evaluate your foot.  Prescriptions: HYDROCHLOROTHIAZIDE 25 MG TABS (HYDROCHLOROTHIAZIDE) Take 1 tablet by mouth once a day  #30 x 1   Entered and Authorized by:   Nani Gasser MD   Signed by:   Nani Gasser MD on 12/17/2010   Method used:   Electronically to        [facility] # [phone]* (retail)       [address]       [place], [place]  [address]       Ph: [phone]       Fax: [phone]   RxID:   [id number]    Orders Added: 1)  Fingerstick [36416] 2)  Creatinine  [82570] 3)  Hemoglobin A1C [83036] 4)  Urine Microalbumin [82044] 5)  Est. Patient Level IV [03474]   Immunization History:  Influenza Immunization History:    Influenza:  historical (08/22/2010)   Immunization History:  Influenza Immunization History:    Influenza:  Historical (08/22/2010)  Laboratory Results   Urine Tests  Date/Time Received: 12/17/10 Date/Time Reported: 12/17/10  Microalbumin (urine): 10 mg/L Creatinine: 100mg /dL  A:C Ratio  30mg /g  Blood Tests   Date/Time Received: 12/17/10 Date/Time Reported: 12/17/10  HGBA1C: 6.5%   (Normal Range: Non-Diabetic - 3-6%   Control Diabetic - 6-8%)         Immunization History:  Influenza Immunization History:    Influenza:  historical (08/22/2010)

## 2011-01-21 ENCOUNTER — Ambulatory Visit: Payer: Self-pay | Admitting: Family Medicine

## 2011-01-25 ENCOUNTER — Encounter: Payer: Self-pay | Admitting: Family Medicine

## 2011-01-25 ENCOUNTER — Ambulatory Visit (INDEPENDENT_AMBULATORY_CARE_PROVIDER_SITE_OTHER): Payer: PRIVATE HEALTH INSURANCE | Admitting: Family Medicine

## 2011-01-25 DIAGNOSIS — I10 Essential (primary) hypertension: Secondary | ICD-10-CM

## 2011-01-25 DIAGNOSIS — E039 Hypothyroidism, unspecified: Secondary | ICD-10-CM

## 2011-01-26 LAB — CONVERTED CEMR LAB
BUN: 18 mg/dL (ref 6–23)
CO2: 18 meq/L — ABNORMAL LOW (ref 19–32)
Calcium: 9.6 mg/dL (ref 8.4–10.5)
Chloride: 104 meq/L (ref 96–112)
Creatinine, Ser: 0.71 mg/dL (ref 0.40–1.20)
Glucose, Bld: 126 mg/dL — ABNORMAL HIGH (ref 70–99)
Potassium: 4.6 meq/L (ref 3.5–5.3)
Sodium: 141 meq/L (ref 135–145)
TSH: 1.103 microintl units/mL (ref 0.350–4.500)

## 2011-02-02 NOTE — Assessment & Plan Note (Signed)
Summary: HTN, thyroid   Vital Signs:  Patient profile:   60 year old female Menstrual status:  hysterectomy 2008 Height:      65.25 inches Weight:      276 pounds BMI:     45.74 O2 Sat:      98 % on Room air Pulse rate:   64 / minute BP sitting:   140 / 66  (left arm) Cuff size:   large  Vitals Entered By: Payton Spark CMA (January 25, 2011 9:21 AM)  O2 Flow:  Room air CC: f/u    Primary Care Provider:  Nani Gasser, MD.   CC:  f/u .  History of Present Illness: Has lost  6 lbs   Diabetes Management History:      The patient is a 60 years old female who comes in for evaluation of Type 2 Diabetes Mellitus.  She is checking home blood sugars.  She says that she is not exercising regularly.    Allergies: No Known Drug Allergies  Physical Exam  General:  Well-developed,well-nourished,in no acute distress; alert,appropriate and cooperative throughout examination Lungs:  Normal respiratory effort, chest expands symmetrically. Lungs are clear to auscultation, no crackles or wheezes. Heart:  Normal rate and regular rhythm. S1 and S2 normal without gallop, murmur, click, rub or other extra sounds. Skin:  no rashes.     Impression & Recommendations:  Problem # 1:  HYPERTENSION, BENIGN (ICD-401.1)  Will add an  ACEi.  Doing well. She is so close with her goal BP and she has done a great job losing weight. Call if any problme with the med. REviewed potential SE if the ACEi.   F/U in 3 months for CPE.    Her updated medication list for this problem includes:    Lisinopril-hydrochlorothiazide 20-25 Mg Tabs (Lisinopril-hydrochlorothiazide) .Marland Kitchen... Take 1 tablet by mouth once a day  Orders: T-Basic Metabolic Panel [phone])  Problem # 2:  HYPOTHYROIDISM (ICD-244.9) Due to recheck levels. She is asymptomatic.  Her updated medication list for this problem includes:    Levothroid 100 Mcg Tabs (Levothyroxine sodium) .Marland Kitchen... 1 by mouth daily  Orders: T-TSH  ([id number])  Complete Medication List: 1)  Levothroid 100 Mcg Tabs (Levothyroxine sodium) .Marland Kitchen.. 1 by mouth daily 2)  Simvastatin 20 Mg Tabs (Simvastatin) .Marland Kitchen.. 1 by mouth daily at bedtime 3)  Asa 81mg   .... 1 by mouth daily 4)  Mv  .Marland KitchenMarland Kitchen. 1 by mouth daily 5)  Calcium 600mg   .... 1 by mouth daily 6)  Fish Oil 1000mg   .... By mouth daily 7)  Lisinopril-hydrochlorothiazide 20-25 Mg Tabs (Lisinopril-hydrochlorothiazide) .... Take 1 tablet by mouth once a day  Other Orders: Dermatology Referral (Derma)  Patient Instructions: 1)  Follow up in 6 months for your physical.  2)  We will call you with the [facility] referral Prescriptions: LISINOPRIL-HYDROCHLOROTHIAZIDE 20-25 MG TABS (LISINOPRIL-HYDROCHLOROTHIAZIDE) Take 1 tablet by mouth once a day  #30 x 1   Entered and Authorized by:   Nani Gasser MD   Signed by:   Nani Gasser MD on 01/25/2011   Method used:   Electronically to        [facility] # [phone]* (retail)       [address]       [place], [place]  [address]       Ph: [phone]       Fax: [phone]   RxID:   [id number]  Orders Added: 1)  T-Basic Metabolic Panel [[id number]] 2)  T-TSH [[id number]] 3)  Dermatology Referral [Derma] 4)  Est. Patient Level III [82956]

## 2011-03-15 ENCOUNTER — Other Ambulatory Visit: Payer: Self-pay | Admitting: Family Medicine

## 2011-03-29 ENCOUNTER — Other Ambulatory Visit: Payer: Self-pay | Admitting: Family Medicine

## 2011-03-31 ENCOUNTER — Other Ambulatory Visit: Payer: Self-pay | Admitting: Family Medicine

## 2011-03-31 NOTE — Telephone Encounter (Signed)
Pt need refill of her lisinopril hct 20-25mg . Plan:  Notified pt this am and she had already picked up a script and has a 3 mth follow up scheduled for next Monday. Jarvis Newcomer, LPN Domingo Dimes

## 2011-04-03 ENCOUNTER — Encounter: Payer: Self-pay | Admitting: Family Medicine

## 2011-04-05 ENCOUNTER — Ambulatory Visit (INDEPENDENT_AMBULATORY_CARE_PROVIDER_SITE_OTHER): Payer: PRIVATE HEALTH INSURANCE | Admitting: Family Medicine

## 2011-04-05 ENCOUNTER — Encounter: Payer: Self-pay | Admitting: Family Medicine

## 2011-04-05 DIAGNOSIS — M722 Plantar fascial fibromatosis: Secondary | ICD-10-CM

## 2011-04-05 DIAGNOSIS — E119 Type 2 diabetes mellitus without complications: Secondary | ICD-10-CM

## 2011-04-05 DIAGNOSIS — E039 Hypothyroidism, unspecified: Secondary | ICD-10-CM

## 2011-04-05 DIAGNOSIS — I10 Essential (primary) hypertension: Secondary | ICD-10-CM

## 2011-04-05 LAB — POCT GLYCOSYLATED HEMOGLOBIN (HGB A1C): Hemoglobin A1C: 6.4

## 2011-04-05 NOTE — Patient Instructions (Signed)
We will call you with the podiatry referral. Please see the handout for exercises.   We will see you in June for your Physical.

## 2011-04-05 NOTE — Progress Notes (Signed)
  Subjective:    Patient ID: Regina Houston, female    DOB: 02/12/1951, 60 y.o.   MRN: [id number]  Diabetes She presents for her follow-up diabetic visit. She has type 2 diabetes mellitus. Her disease course has been stable. There are no hypoglycemic associated symptoms. Pertinent negatives for diabetes include no blurred vision, no chest pain, no fatigue, no polydipsia, no polyuria and no visual change. There are no hypoglycemic complications. Symptoms are stable. There are no diabetic complications. Risk factors for coronary artery disease include hypertension and obesity. When asked about current treatments, none were reported. There is no change in her home blood glucose trend. Her breakfast blood glucose range is generally 110-130 mg/dl.  Hypertension This is a chronic problem. The current episode started more than 1 year ago. The problem is unchanged. The problem is controlled. Pertinent negatives include no blurred vision or chest pain. Agents associated with hypertension include NSAIDs. Risk factors for coronary artery disease include diabetes mellitus. Past treatments include ACE inhibitors and diuretics. There are no compliance problems.    Bilateral Heel Pain for several months. Hx of plantar fascitis several years ago. Worse in the AM when first stands up or after sitting for prolonged periods. Taking Alevel for pain.    Review of Systems  Constitutional: Negative for fatigue.  Eyes: Negative for blurred vision.  Cardiovascular: Negative for chest pain.  Genitourinary: Negative for polyuria.  Hematological: Negative for polydipsia.       Objective:   Physical Exam  Constitutional: She is oriented to person, place, and time. She appears well-developed and well-nourished.  HENT:  Head: Normocephalic and atraumatic.  Neck: Neck supple. No thyromegaly present.  Cardiovascular: Normal rate, regular rhythm and normal heart sounds.        No carotid bruits.   Pulmonary/Chest: Effort  normal and breath sounds normal.  Musculoskeletal:       Bilat ankles with NROM. Tender over the base of the heel on the left. No tenderness on the right.  She does have several hammertoes and deformity of her toes.   Lymphadenopathy:    She has no cervical adenopathy.  Neurological: She is alert and oriented to person, place, and time.  Skin: Skin is warm and dry.  Psychiatric: She has a normal mood and affect.          Assessment & Plan:  bilat Plantar Fascitis - will refer to podiatry. Given a H.O on exercises and stretches. Discussed icing the  Area. Can also add Tylenol if needs aextra pain relief.

## 2011-04-05 NOTE — Assessment & Plan Note (Signed)
Due for recheck in June with her CPE.

## 2011-04-05 NOTE — Assessment & Plan Note (Signed)
Well controlled. A1C is 6.5 today.  Continue current regimen. Follow up in 4 months for DM.

## 2011-04-05 NOTE — Assessment & Plan Note (Addendum)
Doing well.  No change At. Goal. Monitor on days where she feels fatigued or low. Check CMP nad lipids at CPE in 1 month.

## 2011-04-14 ENCOUNTER — Telehealth: Payer: Self-pay | Admitting: Family Medicine

## 2011-04-14 NOTE — Telephone Encounter (Signed)
Pt called and is inquiring about her referral to podiatrist.  I see the order has been put in the system, but has the referral been made or scheduling done???  Please notify the patient with this information. Plan:  Routed to Michaelle Copas in referrals to follow up with this appt. Jarvis Newcomer, LPN [facility]

## 2011-04-23 NOTE — Telephone Encounter (Signed)
Will close this encounter since the referral coordinator in this office has made contact with the patient. Jarvis Newcomer, LPN [facility] Dimes

## 2011-04-23 NOTE — Telephone Encounter (Signed)
Patient has been scheduled with Dr.Sprinkle's office and I left all her detailed appt info on her home machine. Thanks

## 2011-05-04 ENCOUNTER — Encounter: Payer: Self-pay | Admitting: Family Medicine

## 2011-05-04 ENCOUNTER — Ambulatory Visit (INDEPENDENT_AMBULATORY_CARE_PROVIDER_SITE_OTHER): Payer: PRIVATE HEALTH INSURANCE | Admitting: Family Medicine

## 2011-05-04 VITALS — BP 135/74 | HR 68 | Ht 65.0 in | Wt 280.0 lb

## 2011-05-04 DIAGNOSIS — Z0000 Encounter for general adult medical examination without abnormal findings: Secondary | ICD-10-CM

## 2011-05-04 DIAGNOSIS — Z2911 Encounter for prophylactic immunotherapy for respiratory syncytial virus (RSV): Secondary | ICD-10-CM

## 2011-05-04 LAB — TSH: TSH: 1.432 u[IU]/mL (ref 0.350–4.500)

## 2011-05-04 LAB — LIPID PANEL
Cholesterol: 152 mg/dL (ref 0–200)
HDL: 58 mg/dL (ref >39)
LDL Cholesterol: 78 mg/dL (ref 0–99)
Total CHOL/HDL Ratio: 2.6 Ratio
Triglycerides: 78 mg/dL (ref <150)
VLDL: 16 mg/dL (ref 0–40)

## 2011-05-04 NOTE — Patient Instructions (Signed)
Healthy diet and exercise We will call you with your lab results Make sure getting 4 servings of daiy in your diet or take a calcium supplement.

## 2011-05-04 NOTE — Progress Notes (Signed)
  Subjective:     Regina Houston is a 60 y.o. female and is here for a comprehensive physical exam. The patient reports no problems. She has eye appt next week. Has appt with podiatry scheduled. Has f/u with dermatologi in august for lesion on her nose.   History   Social History  . Marital Status: Single    Spouse Name: N/A    Number of Children: N/A  . Years of Education: N/A   Occupational History  . Not on file.   Social History Main Topics  . Smoking status: Never Smoker   . Smokeless tobacco: Not on file  . Alcohol Use: No  . Drug Use: No  . Sexually Active:    Other Topics Concern  . Not on file   Social History Narrative  . No narrative on file   Health Maintenance  Topic Date Due  . Colonoscopy  11/29/1998  . Zostavax  11/29/2008  . Influenza Vaccine  08/23/2011  . Mammogram  05/19/2012  . Tetanus/tdap  11/22/2016    The following portions of the patient's history were reviewed and updated as appropriate: allergies, current medications, past family history, past medical history, past social history and past surgical history.  Review of Systems A comprehensive review of systems was negative.   Objective:    BP 135/74  Pulse 68  Ht 5\' 5"  (1.651 m)  Wt 280 lb (127.007 kg)  BMI 46.59 kg/m2 General appearance: alert, cooperative, appears stated age and moderately obese Head: Normocephalic, without obvious abnormality, atraumatic Eyes: conjunctiva are clear. EOMi, PEERLA Ears: normal TM's and external ear canals both ears Nose: Nares normal. Septum midline. Mucosa normal. No drainage or sinus tenderness. Throat: lips, mucosa, and tongue normal; teeth and gums normal Neck: no adenopathy, no carotid bruit, supple, symmetrical, trachea midline and thyroid not enlarged, symmetric, no tenderness/mass/nodules Back: symmetric, no curvature. ROM normal. No CVA tenderness. Lungs: clear to auscultation bilaterally Breasts: normal appearance, no masses or tenderness Heart:  regular rate and rhythm, S1, S2 normal, no murmur, click, rub or gallop Abdomen: soft, non-tender; bowel sounds normal; no masses,  no organomegaly Extremities: extremities normal, atraumatic, no cyanosis or edema Pulses: 2+ and symmetric Skin: Skin color, texture, turgor normal. No rashes or lesions Lymph nodes: Cervical, supraclavicular, and axillary nodes normal. Neurologic: Mental status: Alert, oriented, thought content appropriate Reflexes: 2+ and symmetric Gait: Normal    Assessment:    Healthy female exam. Herer for screening labs as well.      Plan:     See After Visit Summary for Counseling Recommendations  1. Screening labs 2. Has eye appt this summer 3. Zostavaccine given today 4. Colonoscopy is up to date 5. Due for mammggram this summer. 6. Has dermatology f/u in August 7. Encouraged healthy diet adn exercise

## 2011-05-04 NOTE — Progress Notes (Deleted)
  Subjective:     Regina Houston is a 60 y.o. female and is here for a comprehensive physical exam. The patient reports {problems:16946}.  History   Social History  . Marital Status: Single    Spouse Name: N/A    Number of Children: N/A  . Years of Education: N/A   Occupational History  . Not on file.   Social History Main Topics  . Smoking status: Never Smoker   . Smokeless tobacco: Not on file  . Alcohol Use: No  . Drug Use: No  . Sexually Active:    Other Topics Concern  . Not on file   Social History Narrative  . No narrative on file   Health Maintenance  Topic Date Due  . Colonoscopy  11/29/1998  . Zostavax  11/29/2008  . Influenza Vaccine  08/23/2011  . Mammogram  05/19/2012  . Tetanus/tdap  11/22/2016    {Common ambulatory SmartLinks:19316}  Review of Systems {ros; complete:30496}   Objective:    {Exam, Complete:[id number]}    Assessment:    Healthy female exam. ***     Plan:     See After Visit Summary for Counseling Recommendations

## 2011-05-05 ENCOUNTER — Telehealth: Payer: Self-pay | Admitting: Family Medicine

## 2011-05-05 LAB — COMPLETE METABOLIC PANEL WITH GFR
ALT: 18 U/L (ref 0–35)
AST: 18 U/L (ref 0–37)
Albumin: 4.3 g/dL (ref 3.5–5.2)
Alkaline Phosphatase: 86 U/L (ref 39–117)
BUN: 16 mg/dL (ref 6–23)
CO2: 25 mEq/L (ref 19–32)
Calcium: 9.7 mg/dL (ref 8.4–10.5)
Chloride: 105 mEq/L (ref 96–112)
Creat: 0.71 mg/dL (ref 0.50–1.10)
GFR, Est African American: >60 mL/min (ref >60)
GFR, Est Non African American: >60 mL/min (ref >60)
Glucose, Bld: 124 mg/dL — ABNORMAL HIGH (ref 70–99)
Potassium: 4.4 mEq/L (ref 3.5–5.3)
Sodium: 141 mEq/L (ref 135–145)
Total Bilirubin: 0.4 mg/dL (ref 0.3–1.2)
Total Protein: 6.1 g/dL (ref 6.0–8.3)

## 2011-05-05 NOTE — Telephone Encounter (Signed)
Call patient: Cholesterol and thyroid looked great. Sugar is still in the prediabetic range. Continue current regimen and recheck sugar level in 6 months.

## 2011-05-05 NOTE — Telephone Encounter (Signed)
LMOM informing Pt of the above 

## 2011-05-06 ENCOUNTER — Telehealth: Payer: Self-pay | Admitting: Family Medicine

## 2011-05-06 NOTE — Telephone Encounter (Signed)
Pt called and stated she received a shingles vaccine last Tuesday, and afterwards she got redness, swelling, and warmth with itching. Plan:  Tried to call the patient back both at the home number and the cell number provided on message left by the patient, but she was not available.  Left detailed instructions that these symptoms were mild effects from vaccine that are common affects.  May use cold compresses 24-48 hours, then may go to heat to the injection site.  Tylenol may be used if tolerates okay.  Told to call back for further additional questions. Jarvis Newcomer, LPN [facility]

## 2011-05-11 ENCOUNTER — Other Ambulatory Visit: Payer: Self-pay | Admitting: Family Medicine

## 2011-05-12 ENCOUNTER — Encounter: Payer: Self-pay | Admitting: Family Medicine

## 2011-05-28 ENCOUNTER — Other Ambulatory Visit: Payer: Self-pay | Admitting: Family Medicine

## 2011-05-28 DIAGNOSIS — Z1231 Encounter for screening mammogram for malignant neoplasm of breast: Secondary | ICD-10-CM

## 2011-06-01 ENCOUNTER — Ambulatory Visit
Admission: RE | Admit: 2011-06-01 | Discharge: 2011-06-01 | Disposition: A | Discharge status: Home / Self Care | Payer: PRIVATE HEALTH INSURANCE | Source: Ambulatory Visit | Attending: Family Medicine | Admitting: Family Medicine

## 2011-06-01 DIAGNOSIS — Z1231 Encounter for screening mammogram for malignant neoplasm of breast: Secondary | ICD-10-CM

## 2011-06-04 ENCOUNTER — Telehealth: Payer: Self-pay | Admitting: Family Medicine

## 2011-06-04 NOTE — Telephone Encounter (Signed)
Please call patient. Normal mammogram.  Repeat in 1 year.  

## 2011-06-07 NOTE — Telephone Encounter (Signed)
LMOM advising pt of results and rec. 

## 2011-08-16 ENCOUNTER — Other Ambulatory Visit: Payer: Self-pay | Admitting: Family Medicine

## 2011-09-20 ENCOUNTER — Other Ambulatory Visit: Payer: Self-pay | Admitting: Emergency Medicine

## 2011-09-20 ENCOUNTER — Encounter (INDEPENDENT_AMBULATORY_CARE_PROVIDER_SITE_OTHER): Payer: Self-pay | Admitting: *Deleted

## 2011-09-20 ENCOUNTER — Encounter: Payer: Self-pay | Admitting: Emergency Medicine

## 2011-09-20 ENCOUNTER — Ambulatory Visit
Admission: RE | Admit: 2011-09-20 | Discharge: 2011-09-20 | Disposition: A | Discharge status: Home / Self Care | Payer: Managed Care, Other (non HMO) | Source: Ambulatory Visit | Attending: Emergency Medicine | Admitting: Emergency Medicine

## 2011-09-20 ENCOUNTER — Inpatient Hospital Stay (INDEPENDENT_AMBULATORY_CARE_PROVIDER_SITE_OTHER)
Admission: RE | Admit: 2011-09-20 | Discharge: 2011-09-20 | Disposition: A | Discharge status: Home / Self Care | Payer: Managed Care, Other (non HMO) | Source: Ambulatory Visit | Attending: Emergency Medicine | Admitting: Emergency Medicine

## 2011-09-20 DIAGNOSIS — M25569 Pain in unspecified knee: Secondary | ICD-10-CM

## 2011-09-20 DIAGNOSIS — M170 Bilateral primary osteoarthritis of knee: Secondary | ICD-10-CM | POA: Insufficient documentation

## 2011-09-20 DIAGNOSIS — I10 Essential (primary) hypertension: Secondary | ICD-10-CM | POA: Insufficient documentation

## 2011-10-25 NOTE — Letter (Signed)
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  Please take this into consideration when reviewing the time away from work/school.    Special Instructions:  [  ] None  [  ] To be off the remainder of today, returning to the normal work / school schedule tomorrow.  [  ] To be off until the next scheduled appointment on ______________________.  [  ] Other ________________________________________________________________ ________________________________________________________________________   Sincerely yours,   Clemens Catholic LPN

## 2011-10-25 NOTE — Progress Notes (Signed)
Summary: Left knee pain rm 5   Vital Signs:  Patient Profile:   60 Years Old Female CC:      LT knee pain x last night  Height:     65.25 inches Weight:      289.25 pounds O2 Sat:      95 % O2 treatment:    Room Air Temp:     98.7 degrees F oral Pulse rate:   76 / minute Resp:     18 per minute BP sitting:   121 / 78  (left arm) Cuff size:   large  Pt. in pain?   yes    Location:   knee    Intensity:   7-8    Type:       burning  Vitals Entered By: Clemens Catholic LPN (September 20, 2011 10:31 AM)                   Updated Prior Medication List: LEVOTHROID 100 MCG TABS (LEVOTHYROXINE SODIUM) 1 by mouth daily SIMVASTATIN 20 MG TABS (SIMVASTATIN) 1 by mouth daily at bedtime * ASA 81MG  1 by mouth daily * MV 1 by mouth daily * CALCIUM 600MG  1 by mouth daily * FISH OIL 1000MG  by mouth daily LISINOPRIL-HYDROCHLOROTHIAZIDE 20-25 MG TABS (LISINOPRIL-HYDROCHLOROTHIAZIDE) Take 1 tablet by mouth once a day  Current Allergies (reviewed today): No known allergies History of Present Illness Chief Complaint: LT knee pain x last night  History of Present Illness: L knee pain since last night.  Fell at church onto carpet, but straight onto her L knee.  Felt a twinge of pain at the time, but woke up this morning with bruising and swelling in L knee and stiffness.  She also fell onto her L elbow as well but that's not bothering her today.  Didn't hit head.  No previous knee problems.  REVIEW OF SYSTEMS Constitutional Symptoms      Denies fever, chills, night sweats, weight loss, weight gain, and fatigue.  Eyes       Denies change in vision, eye pain, eye discharge, glasses, contact lenses, and eye surgery. Ear/Nose/Throat/Mouth       Denies hearing loss/aids, change in hearing, ear pain, ear discharge, dizziness, frequent runny nose, frequent nose bleeds, sinus problems, sore throat, hoarseness, and tooth pain or bleeding.  Respiratory       Denies dry cough, productive cough,  wheezing, shortness of breath, asthma, bronchitis, and emphysema/COPD.  Cardiovascular       Denies murmurs, chest pain, and tires easily with exhertion.    Gastrointestinal       Denies stomach pain, nausea/vomiting, diarrhea, constipation, blood in bowel movements, and indigestion. Genitourniary       Denies painful urination, kidney stones, and loss of urinary control. Neurological       Denies paralysis, seizures, and fainting/blackouts. Musculoskeletal       Denies muscle pain, joint pain, joint stiffness, decreased range of motion, redness, swelling, muscle weakness, and gout.  Skin       Denies bruising, unusual mles/lumps or sores, and hair/skin or nail changes.  Psych       Denies mood changes, temper/anger issues, anxiety/stress, speech problems, depression, and sleep problems. Other Comments: pt c/i LT knee injury x last night. she has taken Aleve.   Past History:  Past Medical History: Goiter Diverticulitis DM since 2007 obesity (BMI >40) hyperlipidemia Hypertension  Past Surgical History: Reviewed history from 04/01/2009 and no changes required. Hysterectomy 2008- fibroids.  Left  breast biopsy 2007 - benign calcification.  brain tumor- benign menigioma 2004 hammer toe surgery  Family History: Reviewed history from 04/22/2010 and no changes required. GM with MI Mother with HTN, stroke, DM Father with kidney failure, CAD with CABG at 34  Social History: Reviewed history from 04/01/2009 and no changes required. 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  Physical Exam General appearance: well developed, obese, no acute distress Head: normocephalic, atraumatic MSE: oriented to time, place, and person L knee: FROM, no effusion but localized patellar swelling, + anterior ecchymoses, Lachmans normal, Anterior & posterior drawer normal, McMurrays normal, Varus & valgus  stress normal.  Patella freely mobile but tender with Clarks compression test.  Good alignment. Distal NV status intact. Assessment New Problems: KNEE PAIN (ICD-719.46) HYPERTENSION (ICD-401.9)   Plan New Orders: New Patient Level III [99203] T-DG Knee Complete 4 Views*L* [73564] Planning Comments:   Encourage ice, elevation, rest, Aleve as needed.  Xrays taken and read by radiology as "Findings compatible with soft tissue contusion anterior to the knee.  No underlying fracture.".   If not improving in 5-7 days, call and we will refer to sports med and PT.   The patient and/or caregiver has been counseled thoroughly with regard to medications prescribed including dosage, schedule, interactions, rationale for use, and possible side effects and they verbalize understanding.  Diagnoses and expected course of recovery discussed and will return if not improved as expected or if the condition worsens. Patient and/or caregiver verbalized understanding.   Orders Added: 1)  New Patient Level III [99203] 2)  T-DG Knee Complete 4 Views*L* [40981]

## 2011-11-25 ENCOUNTER — Ambulatory Visit (INDEPENDENT_AMBULATORY_CARE_PROVIDER_SITE_OTHER): Payer: Managed Care, Other (non HMO) | Admitting: Physician Assistant

## 2011-11-25 ENCOUNTER — Encounter: Payer: Self-pay | Admitting: Physician Assistant

## 2011-11-25 VITALS — BP 117/64 | HR 82 | Temp 97.7°F | Ht 65.25 in | Wt 285.0 lb

## 2011-11-25 DIAGNOSIS — J019 Acute sinusitis, unspecified: Secondary | ICD-10-CM

## 2011-11-25 MED ORDER — AMOXICILLIN 500 MG PO CAPS: 1000.0000 mg | ORAL_CAPSULE | Freq: Three times a day (TID) | ORAL | Status: AC

## 2011-11-25 NOTE — Progress Notes (Signed)
  Subjective:    Patient ID: Regina Houston, female    DOB: 11-Dec-1950, 61 y.o.   MRN: [id number]  HPI Patient began experiences symptoms around Thanksgiving of cough, cold, runny nose, nasal congestion and though she just had a cold. She thought it was getting better after about 2 weeks but then noticed it had gotten worse. She now has sinus pressure, headaches, cough without production, post nasal drip.  No SOB, or wheezing. She has not tried anything to make it better. She has not taken anything for fever.    Review of Systems  Constitutional: Positive for fever. Negative for chills.       Fever first couple of days and random days within the 4 weeks but not everyday.  HENT: Positive for postnasal drip and sinus pressure. Negative for sore throat and ear discharge.   Respiratory: Positive for cough. Negative for chest tightness, shortness of breath and wheezing.   Cardiovascular: Negative.   Gastrointestinal: Negative for nausea and vomiting.       Objective:   Physical Exam  Constitutional: She is oriented to person, place, and time. She appears well-developed and well-nourished.  HENT:  Head: Normocephalic and atraumatic.  Right Ear: External ear normal.  Nose: Nose normal.  Mouth/Throat: No oropharyngeal exudate.       Left ear fluid was present behind TM.  Oropharynx erythematous with post-nasal drip present. Maxilary tenderness noted bilaterally.  Eyes: Right eye exhibits no discharge. Left eye exhibits no discharge.  Neck: Normal range of motion. Neck supple.       Goiter present on the right side.  Cardiovascular: Normal rate, regular rhythm and normal heart sounds.   Pulmonary/Chest: Effort normal and breath sounds normal. No respiratory distress. She has no wheezes. She has no rales. She exhibits no tenderness.  Lymphadenopathy:    She has no cervical adenopathy.  Neurological: She is alert and oriented to person, place, and time.  Skin: Skin is warm and dry.  Psychiatric:  She has a normal mood and affect. Her behavior is normal.          Assessment & Plan:  Acute sinusitis- Gave Amoxicillin 1000mg  TID. Encouraged use of sinus rinse and Vitamin C. Instructed patient to use tylenol or motrin as needed for pain or fever.

## 2011-11-25 NOTE — Patient Instructions (Signed)
Start Amoxicillin 1 tab three times a day for sinus infection. Consider starting sinus rinse and using them daily. Can use motrin/tylenol for any pain, headache, or fever.

## 2011-11-30 ENCOUNTER — Other Ambulatory Visit: Payer: Self-pay | Admitting: Family Medicine

## 2011-12-16 ENCOUNTER — Other Ambulatory Visit: Payer: Self-pay | Admitting: Family Medicine

## 2012-04-01 ENCOUNTER — Other Ambulatory Visit: Payer: Self-pay | Admitting: Family Medicine

## 2012-05-04 ENCOUNTER — Encounter: Payer: Self-pay | Admitting: *Deleted

## 2012-05-09 ENCOUNTER — Ambulatory Visit (INDEPENDENT_AMBULATORY_CARE_PROVIDER_SITE_OTHER): Payer: Managed Care, Other (non HMO) | Admitting: Family Medicine

## 2012-05-09 ENCOUNTER — Encounter: Payer: Self-pay | Admitting: Family Medicine

## 2012-05-09 VITALS — BP 116/69 | HR 64 | Ht 65.25 in | Wt 290.0 lb

## 2012-05-09 DIAGNOSIS — E039 Hypothyroidism, unspecified: Secondary | ICD-10-CM

## 2012-05-09 DIAGNOSIS — Z0000 Encounter for general adult medical examination without abnormal findings: Secondary | ICD-10-CM

## 2012-05-09 DIAGNOSIS — E785 Hyperlipidemia, unspecified: Secondary | ICD-10-CM

## 2012-05-09 LAB — CBC WITH DIFFERENTIAL/PLATELET
Basophils Absolute: 0 10*3/uL (ref 0.0–0.1)
Basophils Relative: 0 % (ref 0–1)
Eosinophils Absolute: 0.4 10*3/uL (ref 0.0–0.7)
Eosinophils Relative: 6 % — ABNORMAL HIGH (ref 0–5)
HCT: 41 % (ref 36.0–46.0)
Hemoglobin: 13.7 g/dL (ref 12.0–15.0)
Lymphocytes Relative: 28 % (ref 12–46)
Lymphs Abs: 1.8 10*3/uL (ref 0.7–4.0)
MCH: 29.1 pg (ref 26.0–34.0)
MCHC: 33.4 g/dL (ref 30.0–36.0)
MCV: 87.2 fL (ref 78.0–100.0)
Monocytes Absolute: 0.3 10*3/uL (ref 0.1–1.0)
Monocytes Relative: 5 % (ref 3–12)
Neutro Abs: 3.8 10*3/uL (ref 1.7–7.7)
Neutrophils Relative %: 61 % (ref 43–77)
Platelets: 290 10*3/uL (ref 150–400)
RBC: 4.7 MIL/uL (ref 3.87–5.11)
RDW: 14.3 % (ref 11.5–15.5)
WBC: 6.2 10*3/uL (ref 4.0–10.5)

## 2012-05-09 LAB — COMPLETE METABOLIC PANEL WITH GFR
ALT: 19 U/L (ref 0–35)
AST: 19 U/L (ref 0–37)
Albumin: 4.3 g/dL (ref 3.5–5.2)
Alkaline Phosphatase: 86 U/L (ref 39–117)
BUN: 22 mg/dL (ref 6–23)
CO2: 27 mEq/L (ref 19–32)
Calcium: 9.9 mg/dL (ref 8.4–10.5)
Chloride: 105 mEq/L (ref 96–112)
Creat: 0.71 mg/dL (ref 0.50–1.10)
GFR, Est African American: >89 mL/min
GFR, Est Non African American: >89 mL/min
Glucose, Bld: 141 mg/dL — ABNORMAL HIGH (ref 70–99)
Potassium: 4 mEq/L (ref 3.5–5.3)
Sodium: 140 mEq/L (ref 135–145)
Total Bilirubin: 0.3 mg/dL (ref 0.3–1.2)
Total Protein: 6.2 g/dL (ref 6.0–8.3)

## 2012-05-09 LAB — LIPID PANEL
Cholesterol: 148 mg/dL (ref 0–200)
HDL: 57 mg/dL (ref >39)
LDL Cholesterol: 76 mg/dL (ref 0–99)
Total CHOL/HDL Ratio: 2.6 Ratio
Triglycerides: 74 mg/dL (ref <150)
VLDL: 15 mg/dL (ref 0–40)

## 2012-05-09 LAB — TSH: TSH: 1.26 u[IU]/mL (ref 0.350–4.500)

## 2012-05-09 MED ORDER — LEVOTHYROXINE SODIUM 100 MCG PO TABS: 100.0000 ug | ORAL_TABLET | Freq: Every day | ORAL | Status: DC

## 2012-05-09 NOTE — Progress Notes (Signed)
  Subjective:     Regina Houston is a 61 y.o. female and is here for a comprehensive physical exam. The patient reports no problems.  History   Social History  . Marital Status: Single    Spouse Name: N/A    Number of Children: 0  . Years of Education: BA   Occupational History  . 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    Social History Main Topics  . Smoking status: Never Smoker   . Smokeless tobacco: Not on file  . Alcohol Use: No  . Drug Use: No  . Sexually Active: Not Currently   Other Topics Concern  . Not on file   Social History Narrative   NO regular exercise.    Health Maintenance  Topic Date Due  . Foot Exam  11/29/1958  . Ophthalmology Exam  11/29/1958  . Urine Microalbumin  11/29/1958  . Hemoglobin A1c  10/06/2011  . Influenza Vaccine  08/22/2012  . Mammogram  05/31/2013  . Colonoscopy  11/22/2016  . Tetanus/tdap  11/22/2016  . Zostavax  Completed    The following portions of the patient's history were reviewed and updated as appropriate: allergies, current medications, past family history, past medical history, past social history, past surgical history and problem list.  Review of Systems A comprehensive review of systems was negative.   Objective:    BP 116/69  Pulse 64  Ht 5' 5.25" (1.657 m)  Wt 290 lb (131.543 kg)  BMI 47.89 kg/m2  SpO2 95% General appearance: alert, cooperative, appears stated age and morbidly obese Head: Normocephalic, without obvious abnormality, atraumatic Eyes: conj clear, EOMI, PEERLA Ears: normal TM's and external ear canals both ears Nose: Nares normal. Septum midline. Mucosa normal. No drainage or sinus tenderness. Throat: lips, mucosa, and tongue normal; teeth and gums normal Neck: no adenopathy, no carotid bruit, no JVD, supple, symmetrical, trachea midline and thyroid not enlarged, symmetric, no tenderness/mass/nodules Back: symmetric, no curvature. ROM normal. No CVA tenderness. Lungs: clear to auscultation bilaterally Breasts: normal  appearance, no masses or tenderness Heart: regular rate and rhythm, S1, S2 normal, no murmur, click, rub or gallop Abdomen: soft, non-tender; bowel sounds normal; no masses,  no organomegaly Extremities: extremities normal, atraumatic, no cyanosis or edema Pulses: 2+ and symmetric Skin: Skin color, texture, turgor normal. No rashes or lesions Lymph nodes: Cervical, supraclavicular, and axillary nodes normal. Neurologic: Alert and oriented X 3, normal strength and tone. Normal symmetric reflexes. Normal coordination and gait    Assessment:    Healthy female exam.      Plan:     See After Visit Summary for Counseling Recommendations  Start a regular exercise program and make sure you are eating a healthy diet Try to eat 4 servings of dairy a day Your vaccines are up to date.   Recommend stress test every 5 years  Hypertension-well-controlled.  Hypothyroidism-recheck TSH today.  She's had a hysterectomy and does not need a Pap smear.

## 2012-05-09 NOTE — Patient Instructions (Addendum)
We will call you with your lab results. If you don't here from [place] in about a week then please give [place] a call at [phone]. Start a regular exercise program and make sure you are eating a healthy diet Try to eat 4 servings of dairy a day Your vaccines are up to date.  Mammogram due in July Recommend stress test every 5 years with your family hx of heart disease.

## 2012-05-19 ENCOUNTER — Other Ambulatory Visit: Payer: Self-pay | Admitting: Family Medicine

## 2012-06-20 ENCOUNTER — Other Ambulatory Visit: Payer: Self-pay | Admitting: Family Medicine

## 2012-06-20 DIAGNOSIS — Z9289 Personal history of other medical treatment: Secondary | ICD-10-CM

## 2012-06-27 ENCOUNTER — Ambulatory Visit (INDEPENDENT_AMBULATORY_CARE_PROVIDER_SITE_OTHER): Payer: Managed Care, Other (non HMO)

## 2012-06-27 DIAGNOSIS — Z9289 Personal history of other medical treatment: Secondary | ICD-10-CM

## 2012-06-27 DIAGNOSIS — Z1231 Encounter for screening mammogram for malignant neoplasm of breast: Secondary | ICD-10-CM

## 2012-07-10 ENCOUNTER — Encounter: Payer: Self-pay | Admitting: Family Medicine

## 2012-07-10 ENCOUNTER — Ambulatory Visit (INDEPENDENT_AMBULATORY_CARE_PROVIDER_SITE_OTHER): Payer: Managed Care, Other (non HMO) | Admitting: Family Medicine

## 2012-07-10 VITALS — BP 132/75 | HR 60 | Ht 65.25 in | Wt 294.0 lb

## 2012-07-10 DIAGNOSIS — E119 Type 2 diabetes mellitus without complications: Secondary | ICD-10-CM

## 2012-07-10 DIAGNOSIS — I10 Essential (primary) hypertension: Secondary | ICD-10-CM

## 2012-07-10 LAB — POCT UA - MICROALBUMIN

## 2012-07-10 LAB — POCT GLYCOSYLATED HEMOGLOBIN (HGB A1C): Hemoglobin A1C: 6.8

## 2012-07-10 MED ORDER — METFORMIN HCL 500 MG PO TABS: 500.0000 mg | ORAL_TABLET | Freq: Two times a day (BID) | ORAL | Status: DC

## 2012-07-10 MED ORDER — AMBULATORY NON FORMULARY MEDICATION: Status: DC

## 2012-07-10 NOTE — Addendum Note (Signed)
Addended by: Wyline Beady on: 07/10/2012 03:55 PM   Modules accepted: Orders

## 2012-07-10 NOTE — Addendum Note (Signed)
Addended by: Nani Gasser D on: 07/10/2012 04:43 PM   Modules accepted: Orders

## 2012-07-10 NOTE — Progress Notes (Signed)
  Subjective:    Patient ID: Regina Houston, female    DOB: Mar 15, 1951, 61 y.o.   MRN: [id number]  HPI DM- Says needs a new monitor.Running in the 140 fasting at home, before breakfast. Has never been on meds.  No wounds that arent' healing well. She is primarily diet control. She says she has not been working out regularly. She feels she is doing a good job with her diet.  HTN - No CP or SOB.  Taking meds.     Review of Systems     Objective:   Physical Exam  Constitutional: She is oriented to person, place, and time. She appears well-developed and well-nourished.  HENT:  Head: Normocephalic and atraumatic.  Cardiovascular: Normal rate, regular rhythm and normal heart sounds.   Pulmonary/Chest: Effort normal and breath sounds normal.  Neurological: She is alert and oriented to person, place, and time.  Skin: Skin is warm and dry.  Psychiatric: She has a normal mood and affect. Her behavior is normal.          Assessment & Plan:  DM- Doing well overall.  At goal.  but will start metformin 500mg  bid since A1C is over 6.5.  F/U in 3 months. She's doing with diet but has slacked off on the exercise. Encouraged her to get back into regular routine. Urine micron been performed today. Foot Exam performed today. Encouraged yearly eye exam.  HTN - Well controlled overall.

## 2012-07-31 ENCOUNTER — Other Ambulatory Visit: Payer: Self-pay | Admitting: Family Medicine

## 2012-09-20 ENCOUNTER — Other Ambulatory Visit: Payer: Self-pay | Admitting: Family Medicine

## 2012-10-10 ENCOUNTER — Encounter: Payer: Self-pay | Admitting: Family Medicine

## 2012-10-10 ENCOUNTER — Ambulatory Visit (INDEPENDENT_AMBULATORY_CARE_PROVIDER_SITE_OTHER): Payer: Managed Care, Other (non HMO) | Admitting: Family Medicine

## 2012-10-10 VITALS — BP 110/82 | HR 66 | Ht 65.0 in | Wt 287.0 lb

## 2012-10-10 DIAGNOSIS — E119 Type 2 diabetes mellitus without complications: Secondary | ICD-10-CM

## 2012-10-10 DIAGNOSIS — Z23 Encounter for immunization: Secondary | ICD-10-CM

## 2012-10-10 DIAGNOSIS — E049 Nontoxic goiter, unspecified: Secondary | ICD-10-CM

## 2012-10-10 DIAGNOSIS — I10 Essential (primary) hypertension: Secondary | ICD-10-CM

## 2012-10-10 LAB — POCT GLYCOSYLATED HEMOGLOBIN (HGB A1C): Hemoglobin A1C: 6.2

## 2012-10-10 NOTE — Progress Notes (Signed)
  Subjective:    Patient ID: Regina Houston, female    DOB: January 31, 1951, 61 y.o.   MRN: [id number]  Diabetes She presents for her follow-up diabetic visit. She has type 2 diabetes mellitus. Her disease course has been stable. There are no hypoglycemic associated symptoms. There are no diabetic associated symptoms. There are no hypoglycemic complications. Symptoms are stable. Current diabetic treatment includes diet. Her weight is decreasing steadily. She participates in exercise three times a week. There is no change in her home blood glucose trend. Her breakfast blood glucose range is generally 110-130 mg/dl. An ACE inhibitor/angiotensin II receptor blocker is being taken. Eye exam is current.      Review of Systems     Objective:   Physical Exam  Constitutional: She is oriented to person, place, and time. She appears well-developed and well-nourished.  HENT:  Head: Normocephalic and atraumatic.  Cardiovascular: Normal rate, regular rhythm and normal heart sounds.        No carotid bruits. Radials 2+ bilat  Pulmonary/Chest: Effort normal and breath sounds normal.  Neurological: She is alert and oriented to person, place, and time.  Skin: Skin is warm and dry.  Psychiatric: She has a normal mood and affect. Her behavior is normal.          Assessment & Plan:  DM- Looks geat. Well controlled. Has lost 7 more lbs. Keep up the good work.  A1C today is 6.2. The exam, eye exam, microalbumin is up-to-date. She is on an ACE inhibitor, statin, and a baby aspirin daily. Followup in 4 months. Lab Results  Component Value Date   HGBA1C 6.8 07/10/2012   HTN- blood pressure is up a little bit today. We will repeat at the end of the visit. She was well controlled at her last office visit in August and affect has lost weight and has been eating better and doing better overall. She did take her medication today.

## 2012-10-29 ENCOUNTER — Other Ambulatory Visit: Payer: Self-pay | Admitting: Family Medicine

## 2012-10-30 ENCOUNTER — Other Ambulatory Visit: Payer: Self-pay | Admitting: Family Medicine

## 2012-12-19 ENCOUNTER — Other Ambulatory Visit: Payer: Self-pay | Admitting: Family Medicine

## 2012-12-28 ENCOUNTER — Other Ambulatory Visit: Payer: Self-pay | Admitting: Family Medicine

## 2013-01-29 ENCOUNTER — Other Ambulatory Visit: Payer: Self-pay | Admitting: Family Medicine

## 2013-02-13 ENCOUNTER — Encounter: Payer: Self-pay | Admitting: Family Medicine

## 2013-02-13 ENCOUNTER — Ambulatory Visit (INDEPENDENT_AMBULATORY_CARE_PROVIDER_SITE_OTHER): Payer: Managed Care, Other (non HMO) | Admitting: Family Medicine

## 2013-02-13 VITALS — BP 122/70 | HR 65 | Ht 65.25 in | Wt 280.0 lb

## 2013-02-13 DIAGNOSIS — E119 Type 2 diabetes mellitus without complications: Secondary | ICD-10-CM

## 2013-02-13 DIAGNOSIS — I10 Essential (primary) hypertension: Secondary | ICD-10-CM

## 2013-02-13 DIAGNOSIS — L570 Actinic keratosis: Secondary | ICD-10-CM

## 2013-02-13 LAB — POCT GLYCOSYLATED HEMOGLOBIN (HGB A1C): Hemoglobin A1C: 6.3

## 2013-02-13 LAB — BASIC METABOLIC PANEL WITH GFR
BUN: 21 mg/dL (ref 6–23)
CO2: 26 mEq/L (ref 19–32)
Calcium: 9.5 mg/dL (ref 8.4–10.5)
Chloride: 104 mEq/L (ref 96–112)
Creat: 0.9 mg/dL (ref 0.50–1.10)
GFR, Est African American: 78 mL/min
GFR, Est Non African American: 68 mL/min
Glucose, Bld: 115 mg/dL — ABNORMAL HIGH (ref 70–99)
Potassium: 4.7 mEq/L (ref 3.5–5.3)
Sodium: 139 mEq/L (ref 135–145)

## 2013-02-13 MED ORDER — SIMVASTATIN 20 MG PO TABS: 20.0000 mg | ORAL_TABLET | Freq: Every day | ORAL | Status: DC

## 2013-02-13 MED ORDER — LEVOTHYROXINE SODIUM 100 MCG PO TABS: 100.0000 ug | ORAL_TABLET | Freq: Every day | ORAL | Status: DC

## 2013-02-13 NOTE — Progress Notes (Signed)
  Subjective:    Patient ID: Regina Houston, female    DOB: 1951-06-15, 62 y.o.   MRN: [id number]  HPI DM- Home sugars running around 120. No hypoglyemics.  No wounds that arentt healing well. Ding well on medication.    Morbid obesity - has lost 7 lbs.  No regular exercise but plans on starting. She has been dieting.   HTN -  Pt denies chest pain, SOB, dizziness, or heart palpitations.  Taking meds as directed w/o problems.  Denies medication side effects.   Spot on tip of nose that is coming and going over the last 2 month. Tender and scaley.  No OTC treatments.     Review of Systems     Objective:   Physical Exam  Constitutional: She is oriented to person, place, and time. She appears well-developed and well-nourished.  HENT:  Head: Normocephalic and atraumatic.  Cardiovascular: Normal rate, regular rhythm and normal heart sounds.   Pulmonary/Chest: Effort normal and breath sounds normal.  Neurological: She is alert and oriented to person, place, and time.  Skin: Skin is warm and dry.  Psychiatric: She has a normal mood and affect. Her behavior is normal.          Assessment & Plan:  DM- Well controlled.  A1C is 6.3.  F/U in 4 months.  Eye exam and foot exam are up-to-date.  HTN- well controlled. Continue current regimen.  Morbid Obesity - great job! Continue work on diet and exercise.  AK - Discussed tx options. Will perform cryotherapy. Call if lesion does not resolve.  Cryotherapy Procedure Note  Pre-operative Diagnosis: Actinic keratosis  Post-operative Diagnosis: Actinic keratosis  Locations: tip of nose     Indications: soreness, premalignant lesion  Anesthesia: not required    Procedure Details  Patient informed of risks (permanent scarring, infection, light or dark discoloration, bleeding, infection, weakness, numbness and recurrence of the lesion) and benefits of the procedure and verbal informed consent obtained.  The areas are treated with liquid  nitrogen therapy, frozen until ice ball extended 2 mm beyond lesion, allowed to thaw, and treated again. The patient tolerated procedure well.  The patient was instructed on post-op care, warned that there may be blister formation, redness and pain. Recommend OTC analgesia as needed for pain.  Condition: Stable  Complications: none.  Plan: 1. Instructed to keep the area dry and covered for 24-48h and clean thereafter. 2. Warning signs of infection were reviewed.   3. Recommended that the patient use OTC acetaminophen as needed for pain.  4. Return prn.

## 2013-02-14 ENCOUNTER — Telehealth: Payer: Self-pay | Admitting: *Deleted

## 2013-02-14 NOTE — Telephone Encounter (Signed)
error 

## 2013-04-02 ENCOUNTER — Other Ambulatory Visit: Payer: Self-pay | Admitting: Family Medicine

## 2013-04-10 ENCOUNTER — Other Ambulatory Visit: Payer: Self-pay | Admitting: Family Medicine

## 2013-05-31 ENCOUNTER — Other Ambulatory Visit: Payer: Self-pay

## 2013-06-12 ENCOUNTER — Ambulatory Visit (INDEPENDENT_AMBULATORY_CARE_PROVIDER_SITE_OTHER): Payer: Managed Care, Other (non HMO) | Admitting: Family Medicine

## 2013-06-12 ENCOUNTER — Encounter: Payer: Self-pay | Admitting: Family Medicine

## 2013-06-12 VITALS — BP 120/70 | HR 64 | Ht 65.25 in | Wt 278.0 lb

## 2013-06-12 DIAGNOSIS — E119 Type 2 diabetes mellitus without complications: Secondary | ICD-10-CM

## 2013-06-12 DIAGNOSIS — E039 Hypothyroidism, unspecified: Secondary | ICD-10-CM

## 2013-06-12 DIAGNOSIS — I10 Essential (primary) hypertension: Secondary | ICD-10-CM

## 2013-06-12 LAB — COMPLETE METABOLIC PANEL WITH GFR
ALT: 17 U/L (ref 0–35)
AST: 22 U/L (ref 0–37)
Albumin: 4.3 g/dL (ref 3.5–5.2)
Alkaline Phosphatase: 82 U/L (ref 39–117)
BUN: 17 mg/dL (ref 6–23)
CO2: 24 mEq/L (ref 19–32)
Calcium: 9.3 mg/dL (ref 8.4–10.5)
Chloride: 105 mEq/L (ref 96–112)
Creat: 0.76 mg/dL (ref 0.50–1.10)
GFR, Est African American: >89 mL/min
GFR, Est Non African American: 83 mL/min
Glucose, Bld: 109 mg/dL — ABNORMAL HIGH (ref 70–99)
Potassium: 4.4 mEq/L (ref 3.5–5.3)
Sodium: 142 mEq/L (ref 135–145)
Total Bilirubin: 0.4 mg/dL (ref 0.3–1.2)
Total Protein: 6.3 g/dL (ref 6.0–8.3)

## 2013-06-12 LAB — POCT UA - MICROALBUMIN
Albumin/Creatinine Ratio, Urine, POC: <30
Creatinine, POC: 200 mg/dL
Microalbumin Ur, POC: 10 mg/L

## 2013-06-12 LAB — LIPID PANEL
Cholesterol: 162 mg/dL (ref 0–200)
HDL: 57 mg/dL (ref >39)
LDL Cholesterol: 92 mg/dL (ref 0–99)
Total CHOL/HDL Ratio: 2.8 Ratio
Triglycerides: 66 mg/dL (ref <150)
VLDL: 13 mg/dL (ref 0–40)

## 2013-06-12 LAB — POCT GLYCOSYLATED HEMOGLOBIN (HGB A1C): Hemoglobin A1C: 6.4

## 2013-06-12 LAB — TSH: TSH: 1.164 u[IU]/mL (ref 0.350–4.500)

## 2013-06-12 NOTE — Progress Notes (Signed)
  Subjective:    Patient ID: Regina Houston, female    DOB: Aug 09, 1951, 62 y.o.   MRN: [id number]  HPI DM- no hypodlcyemic event. No wound that aren't healing well.   HTN -  Pt denies chest pain, SOB, dizziness, or heart palpitations.  Taking meds as directed w/o problems.  Denies medication side effects.  \\  Obesity-she is trying to get some exercise. In fact she's lost about 2 more pounds. She slowly has been working on her weight over the last year and is doing a great job.  Review of Systems     Objective:   Physical Exam  Constitutional: She is oriented to person, place, and time. She appears well-developed and well-nourished.  HENT:  Head: Normocephalic and atraumatic.  Cardiovascular: Normal rate, regular rhythm and normal heart sounds.   Pulmonary/Chest: Effort normal and breath sounds normal.  Neurological: She is alert and oriented to person, place, and time.  Skin: Skin is warm and dry.  Psychiatric: She has a normal mood and affect. Her behavior is normal.          Assessment & Plan:  DM- well controlled. On statin, ASA, ACEi.  Has lost a couple of more lbs.    HTN - well controlled.    Chemistry      Component Value Date/Time   NA 139 02/13/2013 0835   K 4.7 02/13/2013 0835   CL 104 02/13/2013 0835   CO2 26 02/13/2013 0835   BUN 21 02/13/2013 0835   CREATININE 0.90 02/13/2013 0835   CREATININE 0.71 01/25/2011 1742      Component Value Date/Time   CALCIUM 9.5 02/13/2013 0835   ALKPHOS 86 05/09/2012 0849   AST 19 05/09/2012 0849   ALT 19 05/09/2012 0849   BILITOT 0.3 05/09/2012 0849     Lab Results  Component Value Date   CHOL 148 05/09/2012   HDL 57 05/09/2012   LDLCALC 76 05/09/2012   TRIG 74 05/09/2012   CHOLHDL 2.6 05/09/2012   Obesity-she's doing a great job. Congratulated her on her continued weight loss. Encouraged her to keep it up.  Hypothyroidism-due to recheck TSH. Last was about a year ago. She denies any recent skin or hair changes. No significant  weight changes.

## 2013-06-12 NOTE — Addendum Note (Signed)
Addended by: Deno Etienne on: 06/12/2013 12:36 PM   Modules accepted: Orders

## 2013-07-05 ENCOUNTER — Other Ambulatory Visit: Payer: Self-pay | Admitting: *Deleted

## 2013-07-05 MED ORDER — METFORMIN HCL 500 MG PO TABS: ORAL_TABLET | ORAL | Status: DC

## 2013-07-25 ENCOUNTER — Telehealth: Payer: Self-pay | Admitting: *Deleted

## 2013-07-25 NOTE — Telephone Encounter (Signed)
Spoke w/camille at [facility] to give VO for pt's lancets and strips.Loralee Pacas [place]

## 2013-08-28 ENCOUNTER — Other Ambulatory Visit: Payer: Self-pay | Admitting: Family Medicine

## 2013-09-25 ENCOUNTER — Other Ambulatory Visit: Payer: Self-pay | Admitting: Family Medicine

## 2013-09-25 ENCOUNTER — Other Ambulatory Visit: Payer: Self-pay | Admitting: *Deleted

## 2013-09-25 MED ORDER — LISINOPRIL-HYDROCHLOROTHIAZIDE 20-25 MG PO TABS: ORAL_TABLET | ORAL | Status: DC

## 2013-09-27 ENCOUNTER — Other Ambulatory Visit: Payer: Self-pay | Admitting: *Deleted

## 2013-10-08 ENCOUNTER — Other Ambulatory Visit: Payer: Self-pay | Admitting: Family Medicine

## 2013-10-16 ENCOUNTER — Encounter: Payer: Self-pay | Admitting: Family Medicine

## 2013-10-16 ENCOUNTER — Ambulatory Visit (INDEPENDENT_AMBULATORY_CARE_PROVIDER_SITE_OTHER): Payer: Managed Care, Other (non HMO) | Admitting: Family Medicine

## 2013-10-16 VITALS — BP 128/71 | HR 63 | Temp 97.6°F | Wt 282.0 lb

## 2013-10-16 DIAGNOSIS — I10 Essential (primary) hypertension: Secondary | ICD-10-CM

## 2013-10-16 DIAGNOSIS — E119 Type 2 diabetes mellitus without complications: Secondary | ICD-10-CM

## 2013-10-16 LAB — POCT GLYCOSYLATED HEMOGLOBIN (HGB A1C): Hemoglobin A1C: 6.2

## 2013-10-16 MED ORDER — SIMVASTATIN 20 MG PO TABS: ORAL_TABLET | ORAL | Status: DC

## 2013-10-16 MED ORDER — METFORMIN HCL 500 MG PO TABS: ORAL_TABLET | ORAL | Status: DC

## 2013-10-16 MED ORDER — LEVOTHYROXINE SODIUM 100 MCG PO TABS: 100.0000 ug | ORAL_TABLET | Freq: Every day | ORAL | Status: DC

## 2013-10-16 MED ORDER — LISINOPRIL-HYDROCHLOROTHIAZIDE 20-25 MG PO TABS: ORAL_TABLET | ORAL | Status: DC

## 2013-10-16 NOTE — Progress Notes (Signed)
  Subjective:    Patient ID: Regina Houston, female    DOB: 1951/05/27, 62 y.o.   MRN: [id number]  HPI DM - No hypoglycemic events. No wounds that aren't healing well.  No S.E with meds. On statin. No regular exercise.    HTN- Pt denies chest pain, SOB, dizziness, or heart palpitations.  Taking meds as directed w/o problems.  Denies medication side effects.     Review of Systems     Objective:   Physical Exam  Constitutional: She is oriented to person, place, and time. She appears well-developed and well-nourished.  HENT:  Head: Normocephalic and atraumatic.  Cardiovascular: Normal rate, regular rhythm and normal heart sounds.   Pulmonary/Chest: Effort normal and breath sounds normal.  Neurological: She is alert and oriented to person, place, and time.  Skin: Skin is warm and dry.  Psychiatric: She has a normal mood and affect. Her behavior is normal.          Assessment & Plan:  DM- on statin, ACE inhibitor, aspirin.  HTN- Well controlled. Continue current regimen.

## 2013-12-18 ENCOUNTER — Ambulatory Visit (INDEPENDENT_AMBULATORY_CARE_PROVIDER_SITE_OTHER): Payer: Medicare Other | Admitting: Family Medicine

## 2013-12-18 ENCOUNTER — Encounter: Payer: Self-pay | Admitting: Family Medicine

## 2013-12-18 VITALS — BP 120/71 | HR 71 | Temp 97.3°F | Wt 284.0 lb

## 2013-12-18 DIAGNOSIS — L84 Corns and callosities: Secondary | ICD-10-CM

## 2013-12-18 DIAGNOSIS — G609 Hereditary and idiopathic neuropathy, unspecified: Secondary | ICD-10-CM

## 2013-12-18 DIAGNOSIS — G5793 Unspecified mononeuropathy of bilateral lower limbs: Secondary | ICD-10-CM

## 2013-12-18 MED ORDER — UREA 40 % EX LOTN: 1.0000 "application " | TOPICAL_LOTION | Freq: Two times a day (BID) | CUTANEOUS | Status: DC | PRN

## 2013-12-18 NOTE — Progress Notes (Signed)
   Subjective:    Patient ID: Regina Houston, female    DOB: December 30, 1950, 63 y.o.   MRN: [id number]  HPI Left foot numbness on the bottom of the foot, her 5tht digit and towards her heel.  Started a couple of weeks ago.  Hx of chronic back pain.  Not related to position.  Seems ot be constant.  Says no aggrevating or alleviating sxs.  No skin color change or rashes. Sugars have been well controlled.  Has custom orthotics from Dr. Wardell Honour 2 years ago and wears thme regularly. She does have a history of callouses. In fact she would actually like a prescription for urea lotion to apply to these. She says Dr. Wardell Honour used to give it to her. She is a diabetic but is normally well controlled. She denies any recent fluctuations in her blood sugars. She has been on metformin for quite some time which does put her at increased risk of B12 deficiency.   Review of Systems      Objective:   Physical Exam  Please see diabetic foot exam for complete exam including pedal pulses. She also had normal range of motion of both ankles with no laxity. Strength is 5 out of 5 in all directions. Strength in the great toe was 5 out of 5 in both feet.      Assessment & Plan:  Numbness in the left foot - unclear etiology at this point. Certainly could be related to position that she just got custom orthotics 2 years ago and is very consistent and wearing them. I would suspect major for changes in such a short period of time. Also consider this could be coming from her spine. She does have chronic back pain that typically she says her back pain is worse on the right. Explained her that still could be a compressed nerve causing some neuropathy-type sensation especially on the lateral left foot in a sciatic nerve distribution. Also consider diabetic neuropathy. The typically this will start in the great toe and several more lateral portion of the foot or the ball of the foot. She has been on metformin for quite some time so she is  at risk for B12 deficiency so we will check this as well. Also check iron and magnesium. We'll call her with the results once available.

## 2013-12-19 LAB — VITAMIN B12: Vitamin B-12: 416 pg/mL (ref 211–911)

## 2013-12-19 LAB — FERRITIN: Ferritin: 51 ng/mL (ref 10–291)

## 2013-12-19 LAB — TSH: TSH: 1.597 u[IU]/mL (ref 0.350–4.500)

## 2013-12-19 LAB — MAGNESIUM: Magnesium: 2 mg/dL (ref 1.5–2.5)

## 2013-12-20 ENCOUNTER — Ambulatory Visit (INDEPENDENT_AMBULATORY_CARE_PROVIDER_SITE_OTHER): Payer: Medicare Other

## 2013-12-20 ENCOUNTER — Other Ambulatory Visit: Payer: Self-pay | Admitting: *Deleted

## 2013-12-20 DIAGNOSIS — M47817 Spondylosis without myelopathy or radiculopathy, lumbosacral region: Secondary | ICD-10-CM

## 2013-12-20 DIAGNOSIS — G8929 Other chronic pain: Secondary | ICD-10-CM

## 2013-12-20 DIAGNOSIS — M549 Dorsalgia, unspecified: Principal | ICD-10-CM

## 2013-12-21 ENCOUNTER — Other Ambulatory Visit: Payer: Self-pay | Admitting: Family Medicine

## 2013-12-21 MED ORDER — GABAPENTIN 100 MG PO CAPS: 100.0000 mg | ORAL_CAPSULE | Freq: Every day | ORAL | Status: DC

## 2013-12-22 ENCOUNTER — Other Ambulatory Visit: Payer: Self-pay | Admitting: Family Medicine

## 2013-12-22 DIAGNOSIS — R208 Other disturbances of skin sensation: Secondary | ICD-10-CM

## 2013-12-22 DIAGNOSIS — R200 Anesthesia of skin: Secondary | ICD-10-CM

## 2014-01-09 ENCOUNTER — Other Ambulatory Visit: Payer: Self-pay | Admitting: *Deleted

## 2014-02-01 DIAGNOSIS — E039 Hypothyroidism, unspecified: Secondary | ICD-10-CM | POA: Insufficient documentation

## 2014-02-01 DIAGNOSIS — E785 Hyperlipidemia, unspecified: Secondary | ICD-10-CM | POA: Insufficient documentation

## 2014-02-19 ENCOUNTER — Ambulatory Visit (INDEPENDENT_AMBULATORY_CARE_PROVIDER_SITE_OTHER): Payer: Medicare Other | Admitting: Family Medicine

## 2014-02-19 ENCOUNTER — Encounter: Payer: Self-pay | Admitting: Family Medicine

## 2014-02-19 VITALS — BP 131/77 | HR 63 | Ht 65.75 in | Wt 275.0 lb

## 2014-02-19 DIAGNOSIS — I10 Essential (primary) hypertension: Secondary | ICD-10-CM

## 2014-02-19 DIAGNOSIS — G5790 Unspecified mononeuropathy of unspecified lower limb: Secondary | ICD-10-CM | POA: Diagnosis not present

## 2014-02-19 DIAGNOSIS — E119 Type 2 diabetes mellitus without complications: Secondary | ICD-10-CM | POA: Diagnosis not present

## 2014-02-19 DIAGNOSIS — Z23 Encounter for immunization: Secondary | ICD-10-CM

## 2014-02-19 DIAGNOSIS — G5792 Unspecified mononeuropathy of left lower limb: Secondary | ICD-10-CM

## 2014-02-19 LAB — POCT GLYCOSYLATED HEMOGLOBIN (HGB A1C): Hemoglobin A1C: 6.2

## 2014-02-19 NOTE — Addendum Note (Signed)
Addended by: Teddy Spike on: 02/19/2014 04:58 PM   Modules accepted: Orders

## 2014-02-19 NOTE — Progress Notes (Signed)
   Subjective:    Patient ID: Regina Houston, female    DOB: [birth year], 63 y.o.   MRN: [id number]  HPI Diabetes - no hypoglycemic events. No wounds or sores that are not healing well. No increased thirst or urination. Checking glucose at home. Taking medications as prescribed without any side effects.  Hypertension- Pt denies chest pain, SOB, dizziness, or heart palpitations.  Taking meds as directed w/o problems.  Denies medication side effects.    Left foot neuropathy. Stil lhaving numbness and pain with the gabapentin but only on one pill. Tried 2 pills but was  Too sedated the next day.  Says has helped her sleep.   Review of Systems     Objective:   Physical Exam  Constitutional: She is oriented to person, place, and time. She appears well-developed and well-nourished.  HENT:  Head: Normocephalic and atraumatic.  Cardiovascular: Normal rate, regular rhythm and normal heart sounds.   Pulmonary/Chest: Effort normal and breath sounds normal.  Neurological: She is alert and oriented to person, place, and time.  Skin: Skin is warm and dry.  Psychiatric: She has a normal mood and affect. Her behavior is normal.          Assessment & Plan:  Diabetes- well controlled.  A1C is 6.2.  Continue current regimen. Followup in 3 months.  On ASA, ACE, statin.   Hypertension- well controlled.  Continue current regimen. Followup in 3 months.  Left foot neuropathy - will refer to Dr. Darene Lamer for her back. Encouraged her to try to attempt to the gabapentin again she's been on one tablet for a while. She may want to try on Friday night and that way she has a couple nights to get used to it before she starts back to work on Monday. I would also like her to see my partner Dr. Aundria Mems for further evaluation. I'm suspicious of her pain coming from her back. She may need additional imaging such as MRI about decide about that.  Due for Prevnar. Discussed and handout provided.

## 2014-02-26 ENCOUNTER — Ambulatory Visit (INDEPENDENT_AMBULATORY_CARE_PROVIDER_SITE_OTHER): Payer: Medicare Other | Admitting: Sports Medicine

## 2014-02-26 ENCOUNTER — Encounter: Payer: Self-pay | Admitting: Sports Medicine

## 2014-02-26 VITALS — BP 129/71 | HR 72 | Ht 65.0 in | Wt 284.0 lb

## 2014-02-26 DIAGNOSIS — M47816 Spondylosis without myelopathy or radiculopathy, lumbar region: Secondary | ICD-10-CM | POA: Insufficient documentation

## 2014-02-26 DIAGNOSIS — M51369 Other intervertebral disc degeneration, lumbar region without mention of lumbar back pain or lower extremity pain: Secondary | ICD-10-CM

## 2014-02-26 DIAGNOSIS — M5137 Other intervertebral disc degeneration, lumbosacral region: Secondary | ICD-10-CM | POA: Diagnosis not present

## 2014-02-26 DIAGNOSIS — M51379 Other intervertebral disc degeneration, lumbosacral region without mention of lumbar back pain or lower extremity pain: Secondary | ICD-10-CM | POA: Diagnosis not present

## 2014-02-26 DIAGNOSIS — M5136 Other intervertebral disc degeneration, lumbar region: Secondary | ICD-10-CM

## 2014-02-26 MED ORDER — MELOXICAM 15 MG PO TABS: ORAL_TABLET | ORAL | Status: DC

## 2014-02-26 MED ORDER — AMBULATORY NON FORMULARY MEDICATION: Status: DC

## 2014-02-26 MED ORDER — PREDNISONE 50 MG PO TABS: ORAL_TABLET | ORAL | Status: DC

## 2014-02-26 NOTE — Progress Notes (Signed)
   Subjective:    I'm seeing this patient as a consultation for:  Dr. Madilyn Fireman  CC: Low back and leg pain  HPI: This is a very pleasant 63 year old female, for decades she's had low back pain, worse with Valsalva, worse with flexion, riding in a car, radiating down both legs, left worse than right, to the bottom of the foot causing numbness and tingling, moderate, persistent. She has not had steroids, physical therapy, x-rays in the past have showed multilevel degenerative changes.  Past medical history, Surgical history, Family history not pertinant except as noted below, Social history, Allergies, and medications have been entered into the medical record, reviewed, and no changes needed.   Review of Systems: No headache, visual changes, nausea, vomiting, diarrhea, constipation, dizziness, abdominal pain, skin rash, fevers, chills, night sweats, weight loss, swollen lymph nodes, body aches, joint swelling, muscle aches, chest pain, shortness of breath, mood changes, visual or auditory hallucinations.   Objective:   General: Well Developed, well nourished, and in no acute distress.  Neuro/Psych: Alert and oriented x3, extra-ocular muscles intact, able to move all 4 extremities, sensation grossly intact. Skin: Warm and dry, no rashes noted.  Respiratory: Not using accessory muscles, speaking in full sentences, trachea midline.  Cardiovascular: Pulses palpable, no extremity edema. Abdomen: Does not appear distended. Back Exam:  Inspection: Unremarkable  Motion: Flexion 45 deg, Extension 45 deg, Side Bending to 45 deg bilaterally,  Rotation to 45 deg bilaterally  SLR laying: Negative  XSLR laying: Negative  Palpable tenderness: None. FABER: negative. Sensory change: Gross sensation intact to all lumbar and sacral dermatomes.  Reflexes: 1+ at both patellar tendons, 1+ at achilles tendons, Babinski's downgoing.  Strength at foot  Plantar-flexion: 5/5 Dorsi-flexion: 5/5 Eversion: 5/5  Inversion: 5/5  Leg strength  Quad: 5/5 Hamstring: 5/5 Hip flexor: 5/5 Hip abductors: 5/5  Gait unremarkable. Lower extremities have significant pitting edema.  Lumbar spine x-ray show multilevel degenerative changes urine  Impression and Recommendations:   This case required medical decision making of moderate complexity.

## 2014-02-26 NOTE — Assessment & Plan Note (Signed)
Bilateral radicular symptoms, and moderate axial pain. She also has likely some component of diabetic peripheral neuropathy as well as numbness and tingling from lower extremity edema. TED hose, physical therapy, prednisone, Mobic, Lyrica samples given. Return to see me in one month, consider MRI for interventional injection of the bladder.

## 2014-02-28 ENCOUNTER — Other Ambulatory Visit: Payer: Self-pay | Admitting: Sports Medicine

## 2014-02-28 DIAGNOSIS — M5136 Other intervertebral disc degeneration, lumbar region: Secondary | ICD-10-CM

## 2014-03-01 ENCOUNTER — Other Ambulatory Visit: Payer: Self-pay

## 2014-03-01 ENCOUNTER — Telehealth: Payer: Self-pay

## 2014-03-01 DIAGNOSIS — M5136 Other intervertebral disc degeneration, lumbar region: Secondary | ICD-10-CM

## 2014-03-01 MED ORDER — AMBULATORY NON FORMULARY MEDICATION: Status: DC

## 2014-03-01 NOTE — Telephone Encounter (Signed)
Faxed compression stockings to pharmacy.

## 2014-03-01 NOTE — Telephone Encounter (Signed)
Sent compression stockings to [facility].

## 2014-03-04 ENCOUNTER — Other Ambulatory Visit: Payer: Self-pay | Admitting: *Deleted

## 2014-03-04 DIAGNOSIS — M5136 Other intervertebral disc degeneration, lumbar region: Secondary | ICD-10-CM

## 2014-03-04 MED ORDER — AMBULATORY NON FORMULARY MEDICATION: Status: DC

## 2014-03-12 ENCOUNTER — Ambulatory Visit (INDEPENDENT_AMBULATORY_CARE_PROVIDER_SITE_OTHER): Payer: Medicare Other | Admitting: Physical Therapy

## 2014-03-12 DIAGNOSIS — M5137 Other intervertebral disc degeneration, lumbosacral region: Secondary | ICD-10-CM

## 2014-03-12 DIAGNOSIS — M6281 Muscle weakness (generalized): Secondary | ICD-10-CM

## 2014-03-12 DIAGNOSIS — R5381 Other malaise: Secondary | ICD-10-CM

## 2014-03-12 DIAGNOSIS — M2550 Pain in unspecified joint: Secondary | ICD-10-CM

## 2014-03-15 ENCOUNTER — Encounter (INDEPENDENT_AMBULATORY_CARE_PROVIDER_SITE_OTHER): Payer: Medicare Other | Admitting: Physical Therapy

## 2014-03-15 DIAGNOSIS — R5381 Other malaise: Secondary | ICD-10-CM

## 2014-03-15 DIAGNOSIS — M5137 Other intervertebral disc degeneration, lumbosacral region: Secondary | ICD-10-CM

## 2014-03-15 DIAGNOSIS — M2550 Pain in unspecified joint: Secondary | ICD-10-CM

## 2014-03-15 DIAGNOSIS — M6281 Muscle weakness (generalized): Secondary | ICD-10-CM

## 2014-03-19 ENCOUNTER — Encounter (INDEPENDENT_AMBULATORY_CARE_PROVIDER_SITE_OTHER): Payer: Medicare Other | Admitting: Physical Therapy

## 2014-03-19 DIAGNOSIS — M6281 Muscle weakness (generalized): Secondary | ICD-10-CM

## 2014-03-19 DIAGNOSIS — M5137 Other intervertebral disc degeneration, lumbosacral region: Secondary | ICD-10-CM

## 2014-03-19 DIAGNOSIS — R5381 Other malaise: Secondary | ICD-10-CM

## 2014-03-19 DIAGNOSIS — M2550 Pain in unspecified joint: Secondary | ICD-10-CM

## 2014-03-21 ENCOUNTER — Encounter (INDEPENDENT_AMBULATORY_CARE_PROVIDER_SITE_OTHER): Payer: No Typology Code available for payment source

## 2014-03-21 DIAGNOSIS — M6281 Muscle weakness (generalized): Secondary | ICD-10-CM

## 2014-03-21 DIAGNOSIS — M2550 Pain in unspecified joint: Secondary | ICD-10-CM

## 2014-03-21 DIAGNOSIS — M5137 Other intervertebral disc degeneration, lumbosacral region: Secondary | ICD-10-CM

## 2014-03-21 DIAGNOSIS — R5381 Other malaise: Secondary | ICD-10-CM

## 2014-03-26 ENCOUNTER — Encounter (INDEPENDENT_AMBULATORY_CARE_PROVIDER_SITE_OTHER): Payer: Medicare Other | Admitting: Physical Therapy

## 2014-03-26 ENCOUNTER — Ambulatory Visit (INDEPENDENT_AMBULATORY_CARE_PROVIDER_SITE_OTHER): Payer: Medicare Other | Admitting: Sports Medicine

## 2014-03-26 ENCOUNTER — Encounter: Payer: Self-pay | Admitting: Sports Medicine

## 2014-03-26 VITALS — BP 117/69 | HR 63 | Ht 60.0 in | Wt 288.0 lb

## 2014-03-26 DIAGNOSIS — M5137 Other intervertebral disc degeneration, lumbosacral region: Secondary | ICD-10-CM

## 2014-03-26 DIAGNOSIS — M6281 Muscle weakness (generalized): Secondary | ICD-10-CM

## 2014-03-26 DIAGNOSIS — R5381 Other malaise: Secondary | ICD-10-CM

## 2014-03-26 DIAGNOSIS — M51379 Other intervertebral disc degeneration, lumbosacral region without mention of lumbar back pain or lower extremity pain: Secondary | ICD-10-CM | POA: Diagnosis not present

## 2014-03-26 DIAGNOSIS — M2550 Pain in unspecified joint: Secondary | ICD-10-CM

## 2014-03-26 DIAGNOSIS — M5136 Other intervertebral disc degeneration, lumbar region: Secondary | ICD-10-CM

## 2014-03-26 DIAGNOSIS — M51369 Other intervertebral disc degeneration, lumbar region without mention of lumbar back pain or lower extremity pain: Secondary | ICD-10-CM

## 2014-03-26 MED ORDER — PREGABALIN 150 MG PO CAPS: 150.0000 mg | ORAL_CAPSULE | Freq: Two times a day (BID) | ORAL | Status: DC

## 2014-03-26 NOTE — Assessment & Plan Note (Signed)
Improved significantly with physical therapy, continues to have moderate axial pain, and bilateral radicular symptoms in S1 distribution, worse on the left side. There is probably also an element of diabetic peripheral neuropathy. Lyrica helped significantly, we are going to increase the dose. She would like 2 to 4 more weeks of physical therapy, she will call me if she decides to proceed with an MRI and intervention.

## 2014-03-26 NOTE — Progress Notes (Signed)
  Subjective:    CC: Followup  HPI: Lumbar degenerative disc disease: Regina Houston is improved significantly with physical therapy medication, unfortunately she continues to have mild radicular symptoms bilaterally, left worse than right, in an S1 distribution. She is continuing to improve with physical therapy and does desire to try an additional month. Symptoms are mild, improving.  Past medical history, Surgical history, Family history not pertinant except as noted below, Social history, Allergies, and medications have been entered into the medical record, reviewed, and no changes needed.   Review of Systems: No fevers, chills, night sweats, weight loss, chest pain, or shortness of breath.   Objective:    General: Well Developed, well nourished, and in no acute distress.  Neuro: Alert and oriented x3, extra-ocular muscles intact, sensation grossly intact.  HEENT: Normocephalic, atraumatic, pupils equal round reactive to light, neck supple, no masses, no lymphadenopathy, thyroid nonpalpable.  Skin: Warm and dry, no rashes. Cardiac: Regular rate and rhythm, no murmurs rubs or gallops, no lower extremity edema.  Respiratory: Clear to auscultation bilaterally. Not using accessory muscles, speaking in full sentences.  Impression and Recommendations:

## 2014-03-27 ENCOUNTER — Encounter (INDEPENDENT_AMBULATORY_CARE_PROVIDER_SITE_OTHER): Payer: Medicare Other

## 2014-03-27 DIAGNOSIS — M6281 Muscle weakness (generalized): Secondary | ICD-10-CM

## 2014-03-27 DIAGNOSIS — M5137 Other intervertebral disc degeneration, lumbosacral region: Secondary | ICD-10-CM

## 2014-03-27 DIAGNOSIS — M2550 Pain in unspecified joint: Secondary | ICD-10-CM

## 2014-03-27 DIAGNOSIS — R5381 Other malaise: Secondary | ICD-10-CM

## 2014-03-28 ENCOUNTER — Telehealth: Payer: Self-pay

## 2014-03-28 NOTE — Telephone Encounter (Signed)
Patient called stated that Lyrica is too expensive she wants to know what she can take that is cheaper. Rhonda Cunningham,CMA

## 2014-03-28 NOTE — Telephone Encounter (Signed)
Can she use one of the discount cards?

## 2014-03-29 NOTE — Telephone Encounter (Signed)
Spoke to patient she stated she would come by the office and pick up cards. Rhonda Cunningham,CMA

## 2014-04-02 ENCOUNTER — Encounter (INDEPENDENT_AMBULATORY_CARE_PROVIDER_SITE_OTHER): Payer: Medicare Other | Admitting: Physical Therapy

## 2014-04-02 DIAGNOSIS — M5137 Other intervertebral disc degeneration, lumbosacral region: Secondary | ICD-10-CM

## 2014-04-02 DIAGNOSIS — M2550 Pain in unspecified joint: Secondary | ICD-10-CM

## 2014-04-02 DIAGNOSIS — M6281 Muscle weakness (generalized): Secondary | ICD-10-CM

## 2014-04-02 DIAGNOSIS — R5381 Other malaise: Secondary | ICD-10-CM

## 2014-04-05 ENCOUNTER — Encounter (INDEPENDENT_AMBULATORY_CARE_PROVIDER_SITE_OTHER): Payer: Medicare Other

## 2014-04-05 DIAGNOSIS — R5381 Other malaise: Secondary | ICD-10-CM

## 2014-04-05 DIAGNOSIS — M6281 Muscle weakness (generalized): Secondary | ICD-10-CM

## 2014-04-05 DIAGNOSIS — M2550 Pain in unspecified joint: Secondary | ICD-10-CM

## 2014-04-05 DIAGNOSIS — M5137 Other intervertebral disc degeneration, lumbosacral region: Secondary | ICD-10-CM

## 2014-04-09 ENCOUNTER — Encounter (INDEPENDENT_AMBULATORY_CARE_PROVIDER_SITE_OTHER): Payer: No Typology Code available for payment source | Admitting: Physical Therapy

## 2014-04-09 DIAGNOSIS — M2550 Pain in unspecified joint: Secondary | ICD-10-CM

## 2014-04-09 DIAGNOSIS — M5137 Other intervertebral disc degeneration, lumbosacral region: Secondary | ICD-10-CM

## 2014-04-09 DIAGNOSIS — M6281 Muscle weakness (generalized): Secondary | ICD-10-CM

## 2014-04-09 DIAGNOSIS — R5381 Other malaise: Secondary | ICD-10-CM

## 2014-04-12 ENCOUNTER — Encounter (INDEPENDENT_AMBULATORY_CARE_PROVIDER_SITE_OTHER): Payer: Medicare Other

## 2014-04-12 DIAGNOSIS — M6281 Muscle weakness (generalized): Secondary | ICD-10-CM

## 2014-04-12 DIAGNOSIS — M2550 Pain in unspecified joint: Secondary | ICD-10-CM

## 2014-04-12 DIAGNOSIS — M5137 Other intervertebral disc degeneration, lumbosacral region: Secondary | ICD-10-CM

## 2014-04-12 DIAGNOSIS — R5381 Other malaise: Secondary | ICD-10-CM

## 2014-04-18 ENCOUNTER — Encounter (INDEPENDENT_AMBULATORY_CARE_PROVIDER_SITE_OTHER): Payer: Medicare Other | Admitting: Physical Therapy

## 2014-04-18 DIAGNOSIS — M6281 Muscle weakness (generalized): Secondary | ICD-10-CM

## 2014-04-18 DIAGNOSIS — M2550 Pain in unspecified joint: Secondary | ICD-10-CM

## 2014-04-18 DIAGNOSIS — M5137 Other intervertebral disc degeneration, lumbosacral region: Secondary | ICD-10-CM

## 2014-04-18 DIAGNOSIS — R5381 Other malaise: Secondary | ICD-10-CM

## 2014-04-19 ENCOUNTER — Encounter (INDEPENDENT_AMBULATORY_CARE_PROVIDER_SITE_OTHER): Payer: Medicare Other | Admitting: Physical Therapy

## 2014-04-19 DIAGNOSIS — R5381 Other malaise: Secondary | ICD-10-CM

## 2014-04-19 DIAGNOSIS — M2550 Pain in unspecified joint: Secondary | ICD-10-CM

## 2014-04-19 DIAGNOSIS — M5137 Other intervertebral disc degeneration, lumbosacral region: Secondary | ICD-10-CM

## 2014-04-19 DIAGNOSIS — M6281 Muscle weakness (generalized): Secondary | ICD-10-CM

## 2014-04-23 ENCOUNTER — Encounter (INDEPENDENT_AMBULATORY_CARE_PROVIDER_SITE_OTHER): Payer: No Typology Code available for payment source | Admitting: Physical Therapy

## 2014-04-23 DIAGNOSIS — M5137 Other intervertebral disc degeneration, lumbosacral region: Secondary | ICD-10-CM

## 2014-04-23 DIAGNOSIS — R5381 Other malaise: Secondary | ICD-10-CM

## 2014-04-23 DIAGNOSIS — M2550 Pain in unspecified joint: Secondary | ICD-10-CM

## 2014-04-23 DIAGNOSIS — M6281 Muscle weakness (generalized): Secondary | ICD-10-CM

## 2014-04-25 ENCOUNTER — Encounter (INDEPENDENT_AMBULATORY_CARE_PROVIDER_SITE_OTHER): Payer: Medicare Other | Admitting: Physical Therapy

## 2014-04-25 DIAGNOSIS — R5381 Other malaise: Secondary | ICD-10-CM

## 2014-04-25 DIAGNOSIS — M2550 Pain in unspecified joint: Secondary | ICD-10-CM

## 2014-04-25 DIAGNOSIS — M5137 Other intervertebral disc degeneration, lumbosacral region: Secondary | ICD-10-CM

## 2014-04-25 DIAGNOSIS — M6281 Muscle weakness (generalized): Secondary | ICD-10-CM

## 2014-04-26 ENCOUNTER — Encounter: Payer: Self-pay | Admitting: Sports Medicine

## 2014-04-26 ENCOUNTER — Telehealth: Payer: Self-pay | Admitting: *Deleted

## 2014-04-26 ENCOUNTER — Ambulatory Visit (INDEPENDENT_AMBULATORY_CARE_PROVIDER_SITE_OTHER): Payer: Medicare Other | Admitting: Sports Medicine

## 2014-04-26 VITALS — BP 118/68 | HR 63 | Wt 292.0 lb

## 2014-04-26 DIAGNOSIS — M5136 Other intervertebral disc degeneration, lumbar region: Secondary | ICD-10-CM

## 2014-04-26 DIAGNOSIS — M5137 Other intervertebral disc degeneration, lumbosacral region: Secondary | ICD-10-CM

## 2014-04-26 DIAGNOSIS — M51369 Other intervertebral disc degeneration, lumbar region without mention of lumbar back pain or lower extremity pain: Secondary | ICD-10-CM

## 2014-04-26 DIAGNOSIS — M51379 Other intervertebral disc degeneration, lumbosacral region without mention of lumbar back pain or lower extremity pain: Secondary | ICD-10-CM | POA: Diagnosis not present

## 2014-04-26 MED ORDER — TRAMADOL HCL 50 MG PO TABS: ORAL_TABLET | ORAL | Status: DC

## 2014-04-26 NOTE — Progress Notes (Signed)
  Subjective:    CC: Followup  HPI: Lumbar radiculopathy: Improved significantly with physical therapy, Lyrica. Unfortunately she continues to have pain that is axial in the back worse with flexion, radiating down the right leg. Symptoms are moderate, persistent. No bowel or bladder dysfunction or constitutional symptoms.  Past medical history, Surgical history, Family history not pertinant except as noted below, Social history, Allergies, and medications have been entered into the medical record, reviewed, and no changes needed.   Review of Systems: No fevers, chills, night sweats, weight loss, chest pain, or shortness of breath.   Objective:    General: Well Developed, well nourished, and in no acute distress.  Neuro: Alert and oriented x3, extra-ocular muscles intact, sensation grossly intact.  HEENT: Normocephalic, atraumatic, pupils equal round reactive to light, neck supple, no masses, no lymphadenopathy, thyroid nonpalpable.  Skin: Warm and dry, no rashes. Cardiac: Regular rate and rhythm, no murmurs rubs or gallops, no lower extremity edema.  Respiratory: Clear to auscultation bilaterally. Not using accessory muscles, speaking in full sentences.  Impression and Recommendations:

## 2014-04-26 NOTE — Assessment & Plan Note (Signed)
Persistent axial pain worse with flexion, with persistent radicular symptoms down the right leg. This is despite physical therapy, Lyrica. Continue meloxicam, adding tramadol for breakthrough pain. MRI for interventional injection planning, return to see me for MRI results.

## 2014-04-26 NOTE — Telephone Encounter (Signed)
No PA required for MRI Lumbar w/o contrast. Pt informed that MRI is scheduled for 05/01/14 at 2pm.  Oscar La, LPN

## 2014-04-30 ENCOUNTER — Other Ambulatory Visit: Payer: Self-pay | Admitting: Family Medicine

## 2014-05-01 ENCOUNTER — Ambulatory Visit (INDEPENDENT_AMBULATORY_CARE_PROVIDER_SITE_OTHER): Payer: Medicare Other

## 2014-05-01 DIAGNOSIS — M5136 Other intervertebral disc degeneration, lumbar region: Secondary | ICD-10-CM

## 2014-05-01 DIAGNOSIS — M5126 Other intervertebral disc displacement, lumbar region: Secondary | ICD-10-CM | POA: Diagnosis not present

## 2014-05-01 DIAGNOSIS — M5137 Other intervertebral disc degeneration, lumbosacral region: Secondary | ICD-10-CM

## 2014-05-01 DIAGNOSIS — M5380 Other specified dorsopathies, site unspecified: Secondary | ICD-10-CM

## 2014-05-02 ENCOUNTER — Ambulatory Visit (INDEPENDENT_AMBULATORY_CARE_PROVIDER_SITE_OTHER): Payer: Medicare Other | Admitting: Sports Medicine

## 2014-05-02 ENCOUNTER — Encounter: Payer: Self-pay | Admitting: Sports Medicine

## 2014-05-02 VITALS — BP 124/70 | HR 77 | Ht 60.0 in | Wt 294.0 lb

## 2014-05-02 DIAGNOSIS — M51379 Other intervertebral disc degeneration, lumbosacral region without mention of lumbar back pain or lower extremity pain: Secondary | ICD-10-CM | POA: Diagnosis not present

## 2014-05-02 DIAGNOSIS — M51369 Other intervertebral disc degeneration, lumbar region without mention of lumbar back pain or lower extremity pain: Secondary | ICD-10-CM

## 2014-05-02 DIAGNOSIS — M5137 Other intervertebral disc degeneration, lumbosacral region: Secondary | ICD-10-CM

## 2014-05-02 DIAGNOSIS — M5136 Other intervertebral disc degeneration, lumbar region: Secondary | ICD-10-CM

## 2014-05-02 NOTE — Progress Notes (Signed)
    Subjective:    CC: MRI results  HPI: Regina Houston returns to see me, she has low back pain with right-sided L5 radiculopathy. She has been through physical therapy, steroids, narcotics, unfortunately continues to have symptoms though she is improved significantly. Lyrica also tends to help significantly.  Past medical history, Surgical history, Family history not pertinant except as noted below, Social history, Allergies, and medications have been entered into the medical record, reviewed, and no changes needed.   Review of Systems: No fevers, chills, night sweats, weight loss, chest pain, or shortness of breath.   Objective:    General: Well Developed, well nourished, and in no acute distress.  Neuro: Alert and oriented x3, extra-ocular muscles intact, sensation grossly intact.  HEENT: Normocephalic, atraumatic, pupils equal round reactive to light, neck supple, no masses, no lymphadenopathy, thyroid nonpalpable.  Skin: Warm and dry, no rashes. Cardiac: Regular rate and rhythm, no murmurs rubs or gallops, no lower extremity edema. Respiratory: Clear to auscultation bilaterally. Not using accessory muscles, speaking in full sentences.  MRI was reviewed and shows multilevel disc protrusions, she does have a large, broad-based disc protrusion at the L5-S1 level but appears to be responsible for her symptoms she also has multilevel protrusions at other levels.  Impression and Recommendations:

## 2014-05-02 NOTE — Assessment & Plan Note (Signed)
Regina Houston has classic right-sided L5 radicular symptoms. She has been through all the conservative measures at this point we think we need to proceed with interventional injection. I did order this in the form of a right-sided L5-S1 transforaminal epidural. Like to see her back approximately 2 weeks after the injection to see how things went.

## 2014-05-07 ENCOUNTER — Ambulatory Visit
Admission: RE | Admit: 2014-05-07 | Discharge: 2014-05-07 | Disposition: A | Discharge status: Home / Self Care | Payer: Medicare Other | Source: Ambulatory Visit | Attending: Sports Medicine | Admitting: Sports Medicine

## 2014-05-07 DIAGNOSIS — IMO0002 Reserved for concepts with insufficient information to code with codable children: Secondary | ICD-10-CM | POA: Diagnosis not present

## 2014-05-07 MED ORDER — IOHEXOL 180 MG/ML  SOLN
1.0000 mL | Freq: Once | INTRAMUSCULAR | Status: AC | PRN
  Administered 2014-05-07: 1 mL via EPIDURAL

## 2014-05-07 MED ORDER — METHYLPREDNISOLONE ACETATE 40 MG/ML INJ SUSP (RADIOLOG
120.0000 mg | Freq: Once | INTRAMUSCULAR | Status: AC
  Administered 2014-05-07: 120 mg via EPIDURAL

## 2014-05-07 NOTE — Discharge Instructions (Signed)

## 2014-05-21 ENCOUNTER — Ambulatory Visit: Payer: Medicare Other | Admitting: Family Medicine

## 2014-05-21 ENCOUNTER — Ambulatory Visit (INDEPENDENT_AMBULATORY_CARE_PROVIDER_SITE_OTHER): Payer: Medicare Other | Admitting: Sports Medicine

## 2014-05-21 ENCOUNTER — Encounter: Payer: Self-pay | Admitting: Sports Medicine

## 2014-05-21 VITALS — BP 106/64 | HR 59 | Ht 65.0 in | Wt 291.0 lb

## 2014-05-21 DIAGNOSIS — M5137 Other intervertebral disc degeneration, lumbosacral region: Secondary | ICD-10-CM | POA: Diagnosis not present

## 2014-05-21 DIAGNOSIS — M51379 Other intervertebral disc degeneration, lumbosacral region without mention of lumbar back pain or lower extremity pain: Secondary | ICD-10-CM

## 2014-05-21 DIAGNOSIS — M51369 Other intervertebral disc degeneration, lumbar region without mention of lumbar back pain or lower extremity pain: Secondary | ICD-10-CM

## 2014-05-21 DIAGNOSIS — M5136 Other intervertebral disc degeneration, lumbar region: Secondary | ICD-10-CM

## 2014-05-21 DIAGNOSIS — M25569 Pain in unspecified knee: Secondary | ICD-10-CM | POA: Diagnosis not present

## 2014-05-21 MED ORDER — CELECOXIB 200 MG PO CAPS: ORAL_CAPSULE | ORAL | Status: DC

## 2014-05-21 NOTE — Assessment & Plan Note (Addendum)
Has tried Naproxen, Meloxicam, Ibuprofen, Aspirin without improvement and with resultant gastritis. Adding Celebrex. Good response with complete resolution of right-sided L5 radicular symptoms with right-sided L5-S1 epidural injection. Now with persistent right-sided axial back pain. I am unclear as to whether this is predominantly facet mediated or disc mediated. 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 I would like to proceed with a right-sided L4-L5 interlaminar epidural as well as a right-sided L4-L5 and L5-S1 facet injection if possible. Medication dosage can be split between these levels. Ideally we would start with the facet injections to determine if concurrent pain occurs.

## 2014-05-21 NOTE — Progress Notes (Signed)
  Subjective:    CC: Followup after epidural  HPI: Areal returns, she had right sided L5 radiculopathy, this was completely resolved after an L5-S1 epidural injection 2 weeks ago. She now complains of right-sided axial back pain, worse when standing, or when sitting. It is moderate, burning in nature, but diffusely localized.  Past medical history, Surgical history, Family history not pertinant except as noted below, Social history, Allergies, and medications have been entered into the medical record, reviewed, and no changes needed.   Review of Systems: No fevers, chills, night sweats, weight loss, chest pain, or shortness of breath.   Objective:    General: Well Developed, well nourished, and in no acute distress.  Neuro: Alert and oriented x3, extra-ocular muscles intact, sensation grossly intact.  HEENT: Normocephalic, atraumatic, pupils equal round reactive to light, neck supple, no masses, no lymphadenopathy, thyroid nonpalpable.  Skin: Warm and dry, no rashes. Cardiac: Regular rate and rhythm, no murmurs rubs or gallops, no lower extremity edema.  Respiratory: Clear to auscultation bilaterally. Not using accessory muscles, speaking in full sentences.  Further review of her lumbar spine MRI shows a disc protrusion at the L4-L5 level, broad-based, causing mild central canal and moderate bilateral foraminal stenosis, as well as right-sided L4-5 and L5-S1 mild facet arthritis.  Impression and Recommendations:

## 2014-05-22 ENCOUNTER — Telehealth: Payer: Self-pay | Admitting: Sports Medicine

## 2014-05-22 NOTE — Assessment & Plan Note (Signed)
Has tried Naproxen, Meloxicam, Ibuprofen, Aspirin without improvement and with resultant gastritis.

## 2014-05-22 NOTE — Telephone Encounter (Signed)
Called [employer]. Appeal for celebrex. Reference number: [id number].

## 2014-05-23 ENCOUNTER — Telehealth: Payer: Self-pay | Admitting: *Deleted

## 2014-05-23 NOTE — Telephone Encounter (Signed)
Msg left for pt that her medication was approved and pharmacy was also notified. [name], CMA

## 2014-05-27 ENCOUNTER — Ambulatory Visit
Admission: RE | Admit: 2014-05-27 | Discharge: 2014-05-27 | Disposition: A | Discharge status: Home / Self Care | Payer: Medicare Other | Source: Ambulatory Visit | Attending: Sports Medicine | Admitting: Sports Medicine

## 2014-05-27 VITALS — BP 135/52 | HR 54

## 2014-05-27 DIAGNOSIS — M545 Low back pain, unspecified: Secondary | ICD-10-CM | POA: Diagnosis not present

## 2014-05-27 DIAGNOSIS — M5136 Other intervertebral disc degeneration, lumbar region: Secondary | ICD-10-CM

## 2014-05-27 MED ORDER — IOHEXOL 180 MG/ML  SOLN
1.0000 mL | Freq: Once | INTRAMUSCULAR | Status: AC | PRN
  Administered 2014-05-27: 1 mL via INTRA_ARTICULAR

## 2014-05-27 MED ORDER — METHYLPREDNISOLONE ACETATE 40 MG/ML INJ SUSP (RADIOLOG
120.0000 mg | Freq: Once | INTRAMUSCULAR | Status: AC
  Administered 2014-05-27: 120 mg via INTRA_ARTICULAR

## 2014-05-27 NOTE — Discharge Instructions (Signed)

## 2014-05-28 ENCOUNTER — Ambulatory Visit (INDEPENDENT_AMBULATORY_CARE_PROVIDER_SITE_OTHER): Payer: Medicare Other

## 2014-05-28 ENCOUNTER — Ambulatory Visit: Payer: No Typology Code available for payment source

## 2014-05-28 ENCOUNTER — Encounter: Payer: Self-pay | Admitting: Family Medicine

## 2014-05-28 ENCOUNTER — Ambulatory Visit (INDEPENDENT_AMBULATORY_CARE_PROVIDER_SITE_OTHER): Payer: Medicare Other | Admitting: Family Medicine

## 2014-05-28 VITALS — BP 118/66 | HR 66 | Ht 65.75 in | Wt 290.0 lb

## 2014-05-28 DIAGNOSIS — Z1231 Encounter for screening mammogram for malignant neoplasm of breast: Secondary | ICD-10-CM

## 2014-05-28 DIAGNOSIS — E039 Hypothyroidism, unspecified: Secondary | ICD-10-CM | POA: Diagnosis not present

## 2014-05-28 DIAGNOSIS — E119 Type 2 diabetes mellitus without complications: Secondary | ICD-10-CM | POA: Diagnosis not present

## 2014-05-28 DIAGNOSIS — Z780 Asymptomatic menopausal state: Secondary | ICD-10-CM

## 2014-05-28 DIAGNOSIS — I10 Essential (primary) hypertension: Secondary | ICD-10-CM

## 2014-05-28 LAB — COMPLETE METABOLIC PANEL WITH GFR
ALT: 17 U/L (ref 0–35)
AST: 17 U/L (ref 0–37)
Albumin: 4.1 g/dL (ref 3.5–5.2)
Alkaline Phosphatase: 96 U/L (ref 39–117)
BUN: 37 mg/dL — ABNORMAL HIGH (ref 6–23)
CO2: 26 mEq/L (ref 19–32)
Calcium: 9.7 mg/dL (ref 8.4–10.5)
Chloride: 104 mEq/L (ref 96–112)
Creat: 0.84 mg/dL (ref 0.50–1.10)
GFR, Est African American: 84 mL/min
GFR, Est Non African American: 73 mL/min
Glucose, Bld: 102 mg/dL — ABNORMAL HIGH (ref 70–99)
Potassium: 5 mEq/L (ref 3.5–5.3)
Sodium: 138 mEq/L (ref 135–145)
Total Bilirubin: 0.3 mg/dL (ref 0.2–1.2)
Total Protein: 6.5 g/dL (ref 6.0–8.3)

## 2014-05-28 LAB — LIPID PANEL
Cholesterol: 155 mg/dL (ref 0–200)
HDL: 77 mg/dL (ref >39)
LDL Cholesterol: 68 mg/dL (ref 0–99)
Total CHOL/HDL Ratio: 2 Ratio
Triglycerides: 51 mg/dL (ref <150)
VLDL: 10 mg/dL (ref 0–40)

## 2014-05-28 LAB — POCT UA - MICROALBUMIN
Albumin/Creatinine Ratio, Urine, POC: <30
Creatinine, POC: 100 mg/dL
Microalbumin Ur, POC: 10 mg/L

## 2014-05-28 LAB — POCT GLYCOSYLATED HEMOGLOBIN (HGB A1C): Hemoglobin A1C: 6.4

## 2014-05-28 NOTE — Progress Notes (Signed)
   Subjective:    Patient ID: Regina Houston, female    DOB: [birth year], 63 y.o.   MRN: [id number]  HPI Diabetes - no hypoglycemic events. No wounds or sores that are not healing well. No increased thirst or urination. Checking glucose at home. Taking medications as prescribed without any side effects.  Hypertension- Pt denies chest pain, SOB, dizziness, or heart palpitations.  Taking meds as directed w/o problems.  Denies medication side effects.    Review of Systems     Objective:   Physical Exam  Constitutional: She is oriented to person, place, and time. She appears well-developed and well-nourished.  HENT:  Head: Normocephalic and atraumatic.  Cardiovascular: Normal rate, regular rhythm and normal heart sounds.   Pulmonary/Chest: Effort normal and breath sounds normal.  Neurological: She is alert and oriented to person, place, and time.  Skin: Skin is warm and dry.  Psychiatric: She has a normal mood and affect. Her behavior is normal.          Assessment & Plan:  DM- well controlled today with an A1c of 6.4. There is a little bit up from last time.  On statin and ACE and ASA.    Hypertension-well-controlled. Continue current regimen.   Due for mammogram and bone density test.

## 2014-05-29 LAB — TSH: TSH: 1.033 u[IU]/mL (ref 0.350–4.500)

## 2014-06-11 ENCOUNTER — Ambulatory Visit (INDEPENDENT_AMBULATORY_CARE_PROVIDER_SITE_OTHER): Payer: Medicare Other

## 2014-06-11 ENCOUNTER — Ambulatory Visit (INDEPENDENT_AMBULATORY_CARE_PROVIDER_SITE_OTHER): Payer: Medicare Other | Admitting: Sports Medicine

## 2014-06-11 VITALS — BP 118/73 | HR 66 | Ht 65.0 in | Wt 293.0 lb

## 2014-06-11 DIAGNOSIS — Z780 Asymptomatic menopausal state: Secondary | ICD-10-CM | POA: Diagnosis not present

## 2014-06-11 DIAGNOSIS — M5137 Other intervertebral disc degeneration, lumbosacral region: Secondary | ICD-10-CM

## 2014-06-11 DIAGNOSIS — M51379 Other intervertebral disc degeneration, lumbosacral region without mention of lumbar back pain or lower extremity pain: Secondary | ICD-10-CM

## 2014-06-11 DIAGNOSIS — Z13820 Encounter for screening for osteoporosis: Secondary | ICD-10-CM

## 2014-06-11 DIAGNOSIS — M51369 Other intervertebral disc degeneration, lumbar region without mention of lumbar back pain or lower extremity pain: Secondary | ICD-10-CM

## 2014-06-11 DIAGNOSIS — M5136 Other intervertebral disc degeneration, lumbar region: Secondary | ICD-10-CM

## 2014-06-11 MED ORDER — TRAMADOL HCL 50 MG PO TABS: ORAL_TABLET | ORAL | Status: DC

## 2014-06-11 NOTE — Assessment & Plan Note (Signed)
Excellent response to a right-sided L5-S1 epidural injection with complete resolution of her right-sided L5 radicular symptoms. Subsequently we injected her right sided L4-L5 and L5-S1 facet joints, the right-sided L4-L5 facet injection produced concordant pain suggesting that the right-sided L4-L5 facet is her axial pain generator, the right L5-S1 facet injection resulted in no concordant pain. She now becomes a candidate for a right-sided L4-L5 facet radio frequency ablation, she would like to hold off for now she is doing okay. Continue Celebrex, refilling tramadol. Return to see me on an as-needed basis.

## 2014-06-11 NOTE — Patient Instructions (Signed)
Radiofrequency Lesioning Radiofrequency lesioning is a procedure to relieve pain. The procedure is often used for back, neck, or arm pain. Radiofrequency lesioning uses a specialized machine that creates radio waves to make heat. The heat damages the nerve that carries the pain signal. Pain relief usually lasts 6 months to 1 year.  LET YOUR CAREGIVER KNOW ABOUT:  Allergies to food or medicine.  Medicines taken, including vitamins, herbs, eyedrops, over-the-counter medicines, and creams.  Use of steroids (by mouth or creams).  Previous problems with anesthetics or numbing medicines.  History of bleeding problems or blood clots.  Previous surgery.  Other health problems, including diabetes and kidney problems.  Possibility of pregnancy, if this applies.  Breathing problems and smoking history.  Any recent colds or infections. RISKS AND COMPLICATIONS This procedure is generally safe. The risks and complications depend on what treatment site is used. General complications may include:  Pain or soreness at the injection site.  Infection at the injection site.  Damage to nerves or blood vessels. BEFORE THE PROCEDURE  Ask your caregiver about changing or stopping any medicines you are on before the procedure.  If you take blood thinners, ask if you should stop taking them before the procedure.  You may be asked to wash with an antibiotic soap before the procedure.  Do not eat or drink for 8 hours before your procedure or as told by your caregiver.  Ask your caregiver what time you need to arrive for your procedure.  This is an outpatient procedure. This means you will be able to go home the same day. Make plans for someone to drive you home. PROCEDURE  You will be awake during the procedure. You need to be able to talk to the surgeon during the procedure. However, you might be given medicine to help you relax (sedative).  Medicine to numb the area (local anesthetic) will be  injected.  With the help of a type of X-ray (fluoroscopy), a radio frequency needle will be inserted into the area to be treated. Then, a wire that carries the radio waves (electrode) will be put through the radio frequency needle. An electrical pulse will be sent through the electrode to verify the correct nerve.  You will feel a tingling sensation similar to hitting your "funny bone." You may also have muscle twitching. The tissue around the needle tip is then heated when electric current is passed using the radio frequency machine. This numbs the nerves.  A bandage (dressing) will be put on the area after the procedure is done. AFTER THE PROCEDURE  You will stay in a recovery area until you are awake enough to eat and drink.  Once everything is back to normal, you will be able to go home.  You will need to arrange for someone to drive you home if you received a sedative or pain relieving medicine during the procedure. Document Released: 07/07/2011 Document Revised: 01/31/2012 Document Reviewed: 07/07/2011 [facility] Patient Information 2015 [place], [place]. This information is not intended to replace advice given to you by your health care provider. Make sure you discuss any questions you have with your health care provider.

## 2014-06-11 NOTE — Progress Notes (Signed)
  Subjective:    CC: Followup after facet injections  HPI: Tanielle returns after her facet injections , in the past she has had an excellent response to a right-sided L5-S1 epidural injection with complete resolution of her right-sided L5 radicular symptoms.  Subsequently we injected her right sided L4-L5 and L5-S1 facet joints, the right-sided L4-L5 facet injection produced concordant pain suggesting that the right-sided L4-L5 facet is her axial pain generator, the right L5-S1 facet injection resulted in no concordant pain. Overall she's doing well, and happy with her results.  Past medical history, Surgical history, Family history not pertinant except as noted below, Social history, Allergies, and medications have been entered into the medical record, reviewed, and no changes needed.   Review of Systems: No fevers, chills, night sweats, weight loss, chest pain, or shortness of breath.   Objective:    General: Well Developed, well nourished, and in no acute distress.  Neuro: Alert and oriented x3, extra-ocular muscles intact, sensation grossly intact.  HEENT: Normocephalic, atraumatic, pupils equal round reactive to light, neck supple, no masses, no lymphadenopathy, thyroid nonpalpable.  Skin: Warm and dry, no rashes. Cardiac: Regular rate and rhythm, no murmurs rubs or gallops, no lower extremity edema.  Respiratory: Clear to auscultation bilaterally. Not using accessory muscles, speaking in full sentences.  Impression and Recommendations:

## 2014-06-12 ENCOUNTER — Telehealth: Payer: Self-pay

## 2014-06-12 DIAGNOSIS — M47816 Spondylosis without myelopathy or radiculopathy, lumbar region: Secondary | ICD-10-CM

## 2014-06-12 NOTE — Telephone Encounter (Signed)
Patient called stated that she wants to go ahead and do RFA with [facility]. Please put order in and I will contact Danyelle to call patient and have her set up. Carolan Avedisian,CMA

## 2014-06-13 NOTE — Telephone Encounter (Signed)
Orders placed for a right-sided L4-L5 facet medial branch block and if effective, radiofrequency ablation. Routing to [place] for scheduling.

## 2014-06-17 NOTE — Telephone Encounter (Signed)
Spoke to [place] from GI and she has been informed to call and schedule patient. Rhonda Cunningham,CMA

## 2014-07-05 ENCOUNTER — Other Ambulatory Visit: Payer: Self-pay | Admitting: Sports Medicine

## 2014-07-05 ENCOUNTER — Ambulatory Visit
Admission: RE | Admit: 2014-07-05 | Discharge: 2014-07-05 | Disposition: A | Discharge status: Home / Self Care | Payer: Medicare Other | Source: Ambulatory Visit | Attending: Sports Medicine | Admitting: Sports Medicine

## 2014-07-05 DIAGNOSIS — M47816 Spondylosis without myelopathy or radiculopathy, lumbar region: Secondary | ICD-10-CM

## 2014-07-05 DIAGNOSIS — M545 Low back pain, unspecified: Secondary | ICD-10-CM | POA: Diagnosis not present

## 2014-07-14 ENCOUNTER — Other Ambulatory Visit: Payer: Self-pay | Admitting: Family Medicine

## 2014-07-16 ENCOUNTER — Encounter: Payer: Self-pay | Admitting: Sports Medicine

## 2014-07-16 ENCOUNTER — Ambulatory Visit (INDEPENDENT_AMBULATORY_CARE_PROVIDER_SITE_OTHER): Payer: Medicare Other | Admitting: Sports Medicine

## 2014-07-16 VITALS — BP 128/71 | HR 71 | Ht 65.0 in | Wt 300.0 lb

## 2014-07-16 DIAGNOSIS — M1710 Unilateral primary osteoarthritis, unspecified knee: Secondary | ICD-10-CM | POA: Diagnosis not present

## 2014-07-16 DIAGNOSIS — M51369 Other intervertebral disc degeneration, lumbar region without mention of lumbar back pain or lower extremity pain: Secondary | ICD-10-CM

## 2014-07-16 DIAGNOSIS — M5137 Other intervertebral disc degeneration, lumbosacral region: Secondary | ICD-10-CM | POA: Diagnosis not present

## 2014-07-16 DIAGNOSIS — M51379 Other intervertebral disc degeneration, lumbosacral region without mention of lumbar back pain or lower extremity pain: Secondary | ICD-10-CM | POA: Diagnosis not present

## 2014-07-16 DIAGNOSIS — M170 Bilateral primary osteoarthritis of knee: Secondary | ICD-10-CM

## 2014-07-16 DIAGNOSIS — M5136 Other intervertebral disc degeneration, lumbar region: Secondary | ICD-10-CM

## 2014-07-16 NOTE — Assessment & Plan Note (Signed)
Good initial response to right-sided L4-5 and L5-S1 facet injections, she did not have relief of her facetogenic pain with medial branch blocks for the right-sided L4-L5 facet joint. At this point we're going to proceed with a right-sided L5-S1 facet injection, this may need to be the future target for medial branch blocks and RFA.

## 2014-07-16 NOTE — Assessment & Plan Note (Addendum)
Left knee injection as above aspirated 25 cc, continue NSAIDs, formal physical therapy. Return in a month.

## 2014-07-16 NOTE — Progress Notes (Signed)
Patient ID: Regina Houston, female   DOB: 1951/08/03, 63 y.o.   MRN: [id number]  Subjective:    CC: Left knee pain and back pain  HPI: Regina Houston is a very pleasant 63 year old female with history of lumbar degenerative disk disease who presents with left knee pain for 6 months after 2 falls onto the knee. Pain is worse after use and improves with rest. Currently taking tramadol, lyrica, and celebrex for treatment of lumbar degenerative disk disease; however, this pain remains evident above this cocktail of drugs. Has not tried any additional medications to specifically treat the knee pain. Previous imaging of the knee in October 2012 did reveal mild degenerative changes of the left knee.  Regina Houston also wished to discuss her back pain today. She has previously had a good response to right-sided L4-L5 and L5-S1 facet injections; however, she recently underwent medial nerve block of the right-sided L4-L5 facet join in anticipation of radiofrequency ablation and this was unsuccessful.   Past medical history, Surgical history, Family history not pertinant except as noted below, Social history, Allergies, and medications have been entered into the medical record, reviewed, and no changes needed.   Review of Systems: No fevers, chills, night sweats, weight loss, chest pain, or shortness of breath.   Objective:    General: Well Developed, well nourished, and in no acute distress.  Neuro: Alert and oriented x3, extra-ocular muscles intact, sensation grossly intact.  HEENT: Normocephalic, atraumatic, pupils equal round reactive to light, neck supple, no masses, no lymphadenopathy, thyroid nonpalpable.  Skin: Warm and dry, no rashes. Cardiac: Regular rate and rhythm, no murmurs rubs or gallops, no lower extremity edema.  Respiratory: Clear to auscultation bilaterally. Not using accessory muscles, speaking in full sentences.  Left Knee: Normal to inspection with no erythema or obvious bony  abnormalities. Palpation with no warmth, patellar tenderness, or condyle tenderness. Medial and lateral joint line tenderness present. ROM full in flexion and extension and lower leg rotation. Ligaments with solid consistent endpoints including ACL, PCL, LCL, MCL. Negative Mcmurray's, Apley's, and Thessalonian tests. Non painful patellar compression. Patellar glide with crepitus. Patellar and quadriceps tendons unremarkable. Hamstring and quadriceps strength is normal.  Procedure: Real-time Ultrasound Guided aspiration/Injection of left knee Device: GE Logiq E  Verbal informed consent obtained.  Time-out conducted.  Noted no overlying erythema, induration, or other signs of local infection.  Skin prepped in a sterile fashion.  Local anesthesia: Topical Ethyl chloride.  With sterile technique and under real time ultrasound guidance: Aspirated 25 cc of straw-colored fluid, syringe switched then 2 cc Kenalog 40, 4 cc lidocaine injected easily.  Completed without difficulty  Pain immediately resolved suggesting accurate placement of the medication.  Advised to call if fevers/chills, erythema, induration, drainage, or persistent bleeding.  Images permanently stored and available for review in the ultrasound unit.  Impression: Technically successful ultrasound guided injection.  Impression and Recommendations:   Left knee pain: Pain along the joint line with crepitus that improves with rest and worsens with use in this patient with a history of degenerative disk disease is suggestive of patellofemoral chondromalacia. This is further supported by imaging in 2012 that revealed mild degenerative changes. - Left knee injection - Continue NSAIDs - Formal PT  - Return in one month  Back pain: With a history of successful right-sided L4-5 and L5-S1 facet injections but subsequently unsuccessful medial branch block of the right-sided L4-L5 facet joint, it is now appropriate to evaluate the  right-sided L5-S1 facet as a possible  site for future nerve block. - Right-sided L5-S1 facet injection

## 2014-07-24 ENCOUNTER — Ambulatory Visit (INDEPENDENT_AMBULATORY_CARE_PROVIDER_SITE_OTHER): Payer: Medicare Other | Admitting: Physical Therapy

## 2014-07-24 DIAGNOSIS — M25569 Pain in unspecified knee: Secondary | ICD-10-CM

## 2014-07-24 DIAGNOSIS — R5381 Other malaise: Secondary | ICD-10-CM

## 2014-07-24 DIAGNOSIS — M2560 Stiffness of unspecified joint, not elsewhere classified: Secondary | ICD-10-CM

## 2014-07-24 DIAGNOSIS — M1710 Unilateral primary osteoarthritis, unspecified knee: Secondary | ICD-10-CM

## 2014-07-26 ENCOUNTER — Encounter (INDEPENDENT_AMBULATORY_CARE_PROVIDER_SITE_OTHER): Payer: Medicare Other | Admitting: Physical Therapy

## 2014-07-26 DIAGNOSIS — R5381 Other malaise: Secondary | ICD-10-CM

## 2014-07-26 DIAGNOSIS — M1710 Unilateral primary osteoarthritis, unspecified knee: Secondary | ICD-10-CM | POA: Diagnosis not present

## 2014-07-26 DIAGNOSIS — M25569 Pain in unspecified knee: Secondary | ICD-10-CM | POA: Diagnosis not present

## 2014-07-26 DIAGNOSIS — M2560 Stiffness of unspecified joint, not elsewhere classified: Secondary | ICD-10-CM | POA: Diagnosis not present

## 2014-07-30 ENCOUNTER — Other Ambulatory Visit: Payer: Self-pay | Admitting: Family Medicine

## 2014-07-30 ENCOUNTER — Other Ambulatory Visit: Payer: Self-pay | Admitting: Sports Medicine

## 2014-07-31 ENCOUNTER — Ambulatory Visit
Admission: RE | Admit: 2014-07-31 | Discharge: 2014-07-31 | Disposition: A | Discharge status: Home / Self Care | Payer: No Typology Code available for payment source | Source: Ambulatory Visit | Attending: Sports Medicine | Admitting: Sports Medicine

## 2014-07-31 DIAGNOSIS — M545 Low back pain, unspecified: Secondary | ICD-10-CM | POA: Diagnosis not present

## 2014-07-31 MED ORDER — IOHEXOL 180 MG/ML  SOLN
1.0000 mL | Freq: Once | INTRAMUSCULAR | Status: AC | PRN
  Administered 2014-07-31: 1 mL via INTRA_ARTICULAR

## 2014-07-31 MED ORDER — METHYLPREDNISOLONE ACETATE 40 MG/ML INJ SUSP (RADIOLOG
120.0000 mg | Freq: Once | INTRAMUSCULAR | Status: AC
  Administered 2014-07-31: 120 mg via INTRA_ARTICULAR

## 2014-08-06 ENCOUNTER — Encounter (INDEPENDENT_AMBULATORY_CARE_PROVIDER_SITE_OTHER): Payer: Medicare Other | Admitting: Physical Therapy

## 2014-08-06 DIAGNOSIS — R5381 Other malaise: Secondary | ICD-10-CM

## 2014-08-06 DIAGNOSIS — M1710 Unilateral primary osteoarthritis, unspecified knee: Secondary | ICD-10-CM | POA: Diagnosis not present

## 2014-08-06 DIAGNOSIS — M25569 Pain in unspecified knee: Secondary | ICD-10-CM

## 2014-08-06 DIAGNOSIS — M2560 Stiffness of unspecified joint, not elsewhere classified: Secondary | ICD-10-CM

## 2014-08-08 ENCOUNTER — Encounter (INDEPENDENT_AMBULATORY_CARE_PROVIDER_SITE_OTHER): Payer: Medicare Other | Admitting: Physical Therapy

## 2014-08-08 DIAGNOSIS — M2560 Stiffness of unspecified joint, not elsewhere classified: Secondary | ICD-10-CM | POA: Diagnosis not present

## 2014-08-08 DIAGNOSIS — M25569 Pain in unspecified knee: Secondary | ICD-10-CM | POA: Diagnosis not present

## 2014-08-08 DIAGNOSIS — M1710 Unilateral primary osteoarthritis, unspecified knee: Secondary | ICD-10-CM

## 2014-08-08 DIAGNOSIS — R5381 Other malaise: Secondary | ICD-10-CM | POA: Diagnosis not present

## 2014-08-09 DIAGNOSIS — E119 Type 2 diabetes mellitus without complications: Secondary | ICD-10-CM | POA: Diagnosis not present

## 2014-08-12 ENCOUNTER — Encounter: Payer: Self-pay | Admitting: Family Medicine

## 2014-08-12 DIAGNOSIS — H269 Unspecified cataract: Secondary | ICD-10-CM | POA: Insufficient documentation

## 2014-08-13 ENCOUNTER — Encounter (INDEPENDENT_AMBULATORY_CARE_PROVIDER_SITE_OTHER): Payer: Medicare Other | Admitting: Physical Therapy

## 2014-08-13 DIAGNOSIS — M25569 Pain in unspecified knee: Secondary | ICD-10-CM | POA: Diagnosis not present

## 2014-08-13 DIAGNOSIS — M2560 Stiffness of unspecified joint, not elsewhere classified: Secondary | ICD-10-CM

## 2014-08-13 DIAGNOSIS — R5381 Other malaise: Secondary | ICD-10-CM | POA: Diagnosis not present

## 2014-08-13 DIAGNOSIS — M1710 Unilateral primary osteoarthritis, unspecified knee: Secondary | ICD-10-CM

## 2014-08-15 ENCOUNTER — Encounter (INDEPENDENT_AMBULATORY_CARE_PROVIDER_SITE_OTHER): Payer: Medicare Other | Admitting: Physical Therapy

## 2014-08-15 DIAGNOSIS — M25569 Pain in unspecified knee: Secondary | ICD-10-CM

## 2014-08-15 DIAGNOSIS — R5381 Other malaise: Secondary | ICD-10-CM

## 2014-08-15 DIAGNOSIS — M1710 Unilateral primary osteoarthritis, unspecified knee: Secondary | ICD-10-CM

## 2014-08-15 DIAGNOSIS — M2560 Stiffness of unspecified joint, not elsewhere classified: Secondary | ICD-10-CM

## 2014-08-16 ENCOUNTER — Encounter: Payer: Self-pay | Admitting: Sports Medicine

## 2014-08-16 ENCOUNTER — Ambulatory Visit (INDEPENDENT_AMBULATORY_CARE_PROVIDER_SITE_OTHER): Payer: Medicare Other | Admitting: Sports Medicine

## 2014-08-16 VITALS — BP 106/66 | HR 67 | Ht 65.0 in | Wt 302.0 lb

## 2014-08-16 DIAGNOSIS — M47817 Spondylosis without myelopathy or radiculopathy, lumbosacral region: Secondary | ICD-10-CM

## 2014-08-16 DIAGNOSIS — M1710 Unilateral primary osteoarthritis, unspecified knee: Secondary | ICD-10-CM

## 2014-08-16 DIAGNOSIS — M47816 Spondylosis without myelopathy or radiculopathy, lumbar region: Secondary | ICD-10-CM

## 2014-08-16 DIAGNOSIS — M170 Bilateral primary osteoarthritis of knee: Secondary | ICD-10-CM

## 2014-08-16 NOTE — Progress Notes (Signed)
  Subjective:    CC: Followup  HPI: Left knee pain: Pain did return despite orthotics, steroid injection at the last visit, and physical therapy. There is swelling, and pain in the joint lines, moderate, persistent.  Low back pain: Initially injected both the L4-5 and L5-S1 facet joints on the right side, then had the initial medial branch blocks for the L4-L5 facet joint which did not provide relief, more recently we obtained a right-sided L5-S1 facet injection that provided good approximately 50% pain relief.  Past medical history, Surgical history, Family history not pertinant except as noted below, Social history, Allergies, and medications have been entered into the medical record, reviewed, and no changes needed.   Review of Systems: No fevers, chills, night sweats, weight loss, chest pain, or shortness of breath.   Objective:    General: Well Developed, well nourished, and in no acute distress.  Neuro: Alert and oriented x3, extra-ocular muscles intact, sensation grossly intact.  HEENT: Normocephalic, atraumatic, pupils equal round reactive to light, neck supple, no masses, no lymphadenopathy, thyroid nonpalpable.  Skin: Warm and dry, no rashes. Cardiac: Regular rate and rhythm, no murmurs rubs or gallops, no lower extremity edema.  Respiratory: Clear to auscultation bilaterally. Not using accessory muscles, speaking in full sentences.  Procedure: Real-time Ultrasound Guided aspiration/Injection of left knee Device: GE Logiq E  Verbal informed consent obtained.  Time-out conducted.  Noted no overlying erythema, induration, or other signs of local infection.  Skin prepped in a sterile fashion.  Local anesthesia: Topical Ethyl chloride.  With sterile technique and under real time ultrasound guidance:  Aspirated 10 cc of thick, straw-colored fluid, syringes switched and 30 mg/2 mL of OrthoVisc (sodium hyaluronate) in a prefilled syringe was injected easily into the knee through a  22-gauge needle. Completed without difficulty  Pain immediately resolved suggesting accurate placement of the medication.  Advised to call if fevers/chills, erythema, induration, drainage, or persistent bleeding.  Images permanently stored and available for review in the ultrasound unit.  Impression: Technically successful ultrasound guided injection.  Impression and Recommendations:

## 2014-08-16 NOTE — Assessment & Plan Note (Signed)
Persistent pain despite physical therapy and recent steroid injection. Starting Visco supplementation, OrthoVisc injection #1 a 4 into left knee. Return in one week for #2.

## 2014-08-16 NOTE — Assessment & Plan Note (Signed)
Good response to initial L4-5 and L5-S1 facet injections, did not respond to medial branch blocks for the L4-L5 facet joint. More recently had an L5-S1 facet injection on the right side and had good, 50% pain relief, this can now service and interventional target for medial branch blocks and radiofrequency ablation. We will discuss this in more detail at a future visit.

## 2014-08-20 ENCOUNTER — Encounter (INDEPENDENT_AMBULATORY_CARE_PROVIDER_SITE_OTHER): Payer: Medicare Other | Admitting: Physical Therapy

## 2014-08-20 DIAGNOSIS — M2560 Stiffness of unspecified joint, not elsewhere classified: Secondary | ICD-10-CM

## 2014-08-20 DIAGNOSIS — M1710 Unilateral primary osteoarthritis, unspecified knee: Secondary | ICD-10-CM | POA: Diagnosis not present

## 2014-08-20 DIAGNOSIS — M25569 Pain in unspecified knee: Secondary | ICD-10-CM | POA: Diagnosis not present

## 2014-08-20 DIAGNOSIS — R5381 Other malaise: Secondary | ICD-10-CM

## 2014-08-22 ENCOUNTER — Encounter (INDEPENDENT_AMBULATORY_CARE_PROVIDER_SITE_OTHER): Payer: Medicare Other | Admitting: Physical Therapy

## 2014-08-22 DIAGNOSIS — M2560 Stiffness of unspecified joint, not elsewhere classified: Secondary | ICD-10-CM

## 2014-08-22 DIAGNOSIS — R5381 Other malaise: Secondary | ICD-10-CM

## 2014-08-22 DIAGNOSIS — M1710 Unilateral primary osteoarthritis, unspecified knee: Secondary | ICD-10-CM

## 2014-08-23 ENCOUNTER — Ambulatory Visit (INDEPENDENT_AMBULATORY_CARE_PROVIDER_SITE_OTHER): Payer: Medicare Other | Admitting: Sports Medicine

## 2014-08-23 VITALS — BP 140/74 | HR 75 | Ht 65.0 in | Wt 303.0 lb

## 2014-08-23 DIAGNOSIS — M47816 Spondylosis without myelopathy or radiculopathy, lumbar region: Secondary | ICD-10-CM | POA: Diagnosis not present

## 2014-08-23 DIAGNOSIS — M170 Bilateral primary osteoarthritis of knee: Secondary | ICD-10-CM | POA: Diagnosis not present

## 2014-08-23 MED ORDER — DICLOFENAC SODIUM 75 MG PO TBEC: 75.0000 mg | DELAYED_RELEASE_TABLET | Freq: Two times a day (BID) | ORAL | Status: DC

## 2014-08-23 NOTE — Progress Notes (Signed)
  Subjective:    CC: Followup  HPI: Left knee osteoarthritis: Overall still hurting, here for OrtohVisc injection #2.  Lumbar spondylosis: Good response to left-sided L4-L5 and L5-S1 facet injections, we then proceeded with a left L4-L5 medial branch block, she did not respond, subsequently she responded well to left-sided L5-S1 facet injection suggesting that the left L5-S1 facet is her principal pain generator. She is overall continuing to do well and is not yet requesting that we proceed down the pathway of medial branch blocks and radiofrequency ablation.  Past medical history, Surgical history, Family history not pertinant except as noted below, Social history, Allergies, and medications have been entered into the medical record, reviewed, and no changes needed.   Review of Systems: No fevers, chills, night sweats, weight loss, chest pain, or shortness of breath.   Objective:    General: Well Developed, well nourished, and in no acute distress.  Neuro: Alert and oriented x3, extra-ocular muscles intact, sensation grossly intact.  HEENT: Normocephalic, atraumatic, pupils equal round reactive to light, neck supple, no masses, no lymphadenopathy, thyroid nonpalpable.  Skin: Warm and dry, no rashes. Cardiac: Regular rate and rhythm, no murmurs rubs or gallops, no lower extremity edema.  Respiratory: Clear to auscultation bilaterally. Not using accessory muscles, speaking in full sentences.  Procedure: Real-time Ultrasound Guided Injection of left knee Device: GE Logiq E  Verbal informed consent obtained.  Time-out conducted.  Noted no overlying erythema, induration, or other signs of local infection.  Skin prepped in a sterile fashion.  Local anesthesia: Topical Ethyl chloride.  With sterile technique and under real time ultrasound guidance:  Aspirated 10 cc of straw-colored fluid, syringe switched and 30 mg/2 mL of OrthoVisc (sodium hyaluronate) in a prefilled syringe was injected  easily into the knee.. Completed without difficulty  Pain immediately resolved suggesting accurate placement of the medication.  Advised to call if fevers/chills, erythema, induration, drainage, or persistent bleeding.  Images permanently stored and available for review in the ultrasound unit.  Impression: Technically successful ultrasound guided injection.  Impression and Recommendations:

## 2014-08-23 NOTE — Assessment & Plan Note (Signed)
Pain generator is likely the left L5-S1 facet. She did not respond to medial branch blocks of the left L4-L5 facet joint, a subsequent L5-S1 facet injection provided good relief, and she continues to have adequate relief.

## 2014-08-23 NOTE — Assessment & Plan Note (Signed)
Still with some pain. OrthoVisc injection #2 of 4 into the left knee. Return in one week 3. Switching from naproxen to Voltaren.  Continue tramadol for breakthrough pain.

## 2014-08-27 ENCOUNTER — Encounter (INDEPENDENT_AMBULATORY_CARE_PROVIDER_SITE_OTHER): Payer: Medicare Other | Admitting: Physical Therapy

## 2014-08-27 DIAGNOSIS — M25569 Pain in unspecified knee: Secondary | ICD-10-CM

## 2014-08-27 DIAGNOSIS — M2560 Stiffness of unspecified joint, not elsewhere classified: Secondary | ICD-10-CM | POA: Diagnosis not present

## 2014-08-27 DIAGNOSIS — R5381 Other malaise: Secondary | ICD-10-CM

## 2014-08-27 DIAGNOSIS — M1710 Unilateral primary osteoarthritis, unspecified knee: Secondary | ICD-10-CM | POA: Diagnosis not present

## 2014-08-27 DIAGNOSIS — M25669 Stiffness of unspecified knee, not elsewhere classified: Secondary | ICD-10-CM

## 2014-08-28 ENCOUNTER — Encounter: Payer: Self-pay | Admitting: Family Medicine

## 2014-08-28 ENCOUNTER — Ambulatory Visit (INDEPENDENT_AMBULATORY_CARE_PROVIDER_SITE_OTHER): Payer: Medicare Other | Admitting: Family Medicine

## 2014-08-28 VITALS — BP 110/65 | HR 59 | Temp 97.7°F | Wt 304.0 lb

## 2014-08-28 DIAGNOSIS — I10 Essential (primary) hypertension: Secondary | ICD-10-CM

## 2014-08-28 DIAGNOSIS — Z23 Encounter for immunization: Secondary | ICD-10-CM

## 2014-08-28 DIAGNOSIS — Z6841 Body Mass Index (BMI) 40.0 and over, adult: Secondary | ICD-10-CM | POA: Diagnosis not present

## 2014-08-28 DIAGNOSIS — E119 Type 2 diabetes mellitus without complications: Secondary | ICD-10-CM

## 2014-08-28 LAB — POCT GLYCOSYLATED HEMOGLOBIN (HGB A1C): Hemoglobin A1C: 6.2

## 2014-08-28 MED ORDER — PHENTERMINE HCL 37.5 MG PO CAPS: 37.5000 mg | ORAL_CAPSULE | ORAL | Status: DC

## 2014-08-28 NOTE — Assessment & Plan Note (Signed)
Well-controlled.  Continue current regimen. 

## 2014-08-28 NOTE — Patient Instructions (Addendum)
My Fitness Pal to count calories.   Start an exercise routine.

## 2014-08-28 NOTE — Progress Notes (Signed)
   Subjective:    Patient ID: Regina Houston, female    DOB: [birth year], 63 y.o.   MRN: [id number]  Diabetes   Diabetes - no hypoglycemic events. No wounds or sores that are not healing well. No increased thirst or urination. Checking glucose at home. Taking medications as prescribed without any side effects.  Hypertension- Pt denies chest pain, SOB, dizziness, or heart palpitations.  Taking meds as directed w/o problems.  Denies medication side effects.    Obesity/BMI 50 - she would really like to work on weight loss.  She has gained back weight and knows for her back and knee she really needs to work  On this. She is not exercising right now.    Review of Systems     Objective:   Physical Exam  Constitutional: She is oriented to person, place, and time. She appears well-developed and well-nourished.  HENT:  Head: Normocephalic and atraumatic.  Cardiovascular: Normal rate, regular rhythm and normal heart sounds.   Pulmonary/Chest: Effort normal and breath sounds normal.  Neurological: She is alert and oriented to person, place, and time.  Skin: Skin is warm and dry.  Psychiatric: She has a normal mood and affect. Her behavior is normal.          Assessment & Plan:  Obestiy/BMI 50 - Dicussed options.  She would like to get down to 200lbs.  Discussed the importance of an exercise regimen. He is to participate in water aerobics and says she might check into this again. Discussed using My Fitness Pal to count calories.  She's interested in weight loss medication. We discussed the pros and cons and potential side effects of phentermine. She has no prior history of heart disease and her blood pressure is well-controlled. We will start the medication and followup in one month.

## 2014-08-28 NOTE — Assessment & Plan Note (Addendum)
Foot exam performed today. On ASA and statin.  Well controlled. F/U in 3 months.

## 2014-08-29 ENCOUNTER — Encounter (INDEPENDENT_AMBULATORY_CARE_PROVIDER_SITE_OTHER): Payer: Medicare Other

## 2014-08-29 DIAGNOSIS — M2560 Stiffness of unspecified joint, not elsewhere classified: Secondary | ICD-10-CM

## 2014-08-29 DIAGNOSIS — R5381 Other malaise: Secondary | ICD-10-CM

## 2014-08-29 DIAGNOSIS — M1710 Unilateral primary osteoarthritis, unspecified knee: Secondary | ICD-10-CM

## 2014-08-30 ENCOUNTER — Encounter: Payer: Self-pay | Admitting: Sports Medicine

## 2014-08-30 ENCOUNTER — Ambulatory Visit (INDEPENDENT_AMBULATORY_CARE_PROVIDER_SITE_OTHER): Payer: Medicare Other | Admitting: Sports Medicine

## 2014-08-30 VITALS — BP 126/71 | HR 83 | Ht 65.0 in | Wt 305.0 lb

## 2014-08-30 DIAGNOSIS — M170 Bilateral primary osteoarthritis of knee: Secondary | ICD-10-CM

## 2014-08-30 NOTE — Progress Notes (Signed)
  Procedure: Real-time Ultrasound Guided Injection of left knee Device: GE Logiq E  Verbal informed consent obtained.  Time-out conducted.  Noted no overlying erythema, induration, or other signs of local infection.  Skin prepped in a sterile fashion.  Local anesthesia: Topical Ethyl chloride.  With sterile technique and under real time ultrasound guidance:  Aspirated 25c of straw-colored fluid, syringe switched and 30 mg/2 mL of OrthoVisc (sodium hyaluronate) in a prefilled syringe was injected easily into the knee.. Completed without difficulty  Pain immediately resolved suggesting accurate placement of the medication.  Advised to call if fevers/chills, erythema, induration, drainage, or persistent bleeding.  Images permanently stored and available for review in the ultrasound unit.  Impression: Technically successful ultrasound guided injection.  Impression and Recommendations:

## 2014-08-30 NOTE — Assessment & Plan Note (Signed)
Aspiration and OrthoVisc injection #3 into the left knee. Return in one week for #4.

## 2014-09-03 ENCOUNTER — Other Ambulatory Visit: Payer: Self-pay | Admitting: Sports Medicine

## 2014-09-06 ENCOUNTER — Ambulatory Visit (INDEPENDENT_AMBULATORY_CARE_PROVIDER_SITE_OTHER): Payer: Medicare Other | Admitting: Sports Medicine

## 2014-09-06 ENCOUNTER — Encounter: Payer: Self-pay | Admitting: Sports Medicine

## 2014-09-06 VITALS — BP 114/59 | HR 73 | Ht 65.0 in | Wt 301.0 lb

## 2014-09-06 DIAGNOSIS — M170 Bilateral primary osteoarthritis of knee: Secondary | ICD-10-CM

## 2014-09-06 NOTE — Assessment & Plan Note (Signed)
OrthoVisc injection #4 of 4. Pain-free. Return in one month.

## 2014-09-06 NOTE — Progress Notes (Signed)
  Procedure: Real-time Ultrasound Guided Injection of left knee Device: GE Logiq E  Verbal informed consent obtained.  Time-out conducted.  Noted no overlying erythema, induration, or other signs of local infection.  Skin prepped in a sterile fashion.  Local anesthesia: Topical Ethyl chloride.  With sterile technique and under real time ultrasound guidance:  Aspirated 30cc of straw-colored fluid, syringe switched and 30 mg/2 mL of OrthoVisc (sodium hyaluronate) in a prefilled syringe was injected easily into the knee.. Completed without difficulty  Pain immediately resolved suggesting accurate placement of the medication.  Advised to call if fevers/chills, erythema, induration, drainage, or persistent bleeding.  Images permanently stored and available for review in the ultrasound unit.  Impression: Technically successful ultrasound guided injection.  Impression and Recommendations:

## 2014-09-14 ENCOUNTER — Other Ambulatory Visit: Payer: Self-pay | Admitting: Sports Medicine

## 2014-09-25 ENCOUNTER — Ambulatory Visit (INDEPENDENT_AMBULATORY_CARE_PROVIDER_SITE_OTHER): Payer: Medicare Other | Admitting: Family Medicine

## 2014-09-25 ENCOUNTER — Encounter: Payer: Self-pay | Admitting: Family Medicine

## 2014-09-25 VITALS — BP 99/64 | HR 75 | Ht 65.0 in | Wt 298.0 lb

## 2014-09-25 DIAGNOSIS — R635 Abnormal weight gain: Secondary | ICD-10-CM

## 2014-09-25 MED ORDER — PHENTERMINE HCL 37.5 MG PO CAPS: 37.5000 mg | ORAL_CAPSULE | ORAL | Status: DC

## 2014-09-25 NOTE — Progress Notes (Signed)
Patient doing well on appetite suppressant. Here for nurse visit, weight, BP, HR check. Denies problems with insomnia, heart palpitations or tremors. Satisfied with weight loss thus far and is working on healthy diet and regular exercise. .Regina Houston   

## 2014-09-25 NOTE — Progress Notes (Signed)
   Subjective:    Patient ID: Regina Houston, female    DOB: 1951/03/29, 63 y.o.   MRN: [id number]  HPI    Review of Systems     Objective:   Physical Exam        Assessment & Plan:  Abnormal weight loss. Has lost 3 lbs. F/U in 1 month for nurse visit.  BP as goal.   Hypertension- Pt denies chest pain, SOB, dizziness, or heart palpitations.  Taking meds as directed w/o problems.  Denies medication side effects.

## 2014-10-04 ENCOUNTER — Other Ambulatory Visit: Payer: Self-pay | Admitting: Sports Medicine

## 2014-10-07 ENCOUNTER — Ambulatory Visit: Payer: Medicare Other | Admitting: Sports Medicine

## 2014-10-08 ENCOUNTER — Encounter: Payer: Self-pay | Admitting: Sports Medicine

## 2014-10-08 ENCOUNTER — Ambulatory Visit (INDEPENDENT_AMBULATORY_CARE_PROVIDER_SITE_OTHER): Payer: Medicare Other | Admitting: Sports Medicine

## 2014-10-08 VITALS — BP 135/75 | HR 84 | Ht 65.0 in | Wt 296.0 lb

## 2014-10-08 DIAGNOSIS — M170 Bilateral primary osteoarthritis of knee: Secondary | ICD-10-CM

## 2014-10-08 DIAGNOSIS — M47816 Spondylosis without myelopathy or radiculopathy, lumbar region: Secondary | ICD-10-CM | POA: Diagnosis not present

## 2014-10-08 NOTE — Assessment & Plan Note (Signed)
Left knee is doing well post Visco supplementation. Continue diclofenac and tramadol as needed.

## 2014-10-08 NOTE — Progress Notes (Signed)
  Subjective:    CC: follow-up  HPI: Left knee osteoarthritis: Doing well after viscous supplementation in did a month ago.  Lumbar spondylosis: Insufficient response to a right-sided L4-L5 facet joint medial branch block, subsequently had a good response to a right-sided L5-S1 facet injection. Amenable to start down the pathway of radio frequency ablation for the right L5-S1 facet joint. Steroid injection did provide proximally 2 months of relief.  Past medical history, Surgical history, Family history not pertinant except as noted below, Social history, Allergies, and medications have been entered into the medical record, reviewed, and no changes needed.   Review of Systems: No fevers, chills, night sweats, weight loss, chest pain, or shortness of breath.   Objective:    General: Well Developed, well nourished, and in no acute distress.  Neuro: Alert and oriented x3, extra-ocular muscles intact, sensation grossly intact.  HEENT: Normocephalic, atraumatic, pupils equal round reactive to light, neck supple, no masses, no lymphadenopathy, thyroid nonpalpable.  Skin: Warm and dry, no rashes. Cardiac: Regular rate and rhythm, no murmurs rubs or gallops, no lower extremity edema.  Respiratory: Clear to auscultation bilaterally. Not using accessory muscles, speaking in full sentences. Left Knee: Normal to inspection with no erythema or effusion or obvious bony abnormalities. Palpation normal with no warmth or joint line tenderness or patellar tenderness or condyle tenderness. ROM normal in flexion and extension and lower leg rotation. Ligaments with solid consistent endpoints including ACL, PCL, LCL, MCL. Negative Mcmurray's and provocative meniscal tests. Non painful patellar compression. Patellar and quadriceps tendons unremarkable. Hamstring and quadriceps strength is normal.  Impression and Recommendations:

## 2014-10-08 NOTE — Assessment & Plan Note (Signed)
Did not respond to medial branch blocks for the right L4-L5 facet joint, and thus was not a candidate for radio frequency ablation. We decided to put this on the back burner as we treated her knee arthritis. Subsequently a right-sided L5-S1 facet injection provided fantastic lasting relief for 2 months. At this point we are going to proceed with medial branch blocks and subsequently radiofrequency ablation for the right L5-S1 facet joint. Return to see me one month after RFA if scheduled.

## 2014-10-14 ENCOUNTER — Other Ambulatory Visit: Payer: Self-pay | Admitting: Family Medicine

## 2014-10-16 ENCOUNTER — Other Ambulatory Visit: Payer: Self-pay | Admitting: Sports Medicine

## 2014-10-16 DIAGNOSIS — M47816 Spondylosis without myelopathy or radiculopathy, lumbar region: Secondary | ICD-10-CM

## 2014-10-21 ENCOUNTER — Other Ambulatory Visit: Payer: Self-pay | Admitting: Sports Medicine

## 2014-10-21 ENCOUNTER — Other Ambulatory Visit: Payer: Medicare Other

## 2014-10-21 ENCOUNTER — Ambulatory Visit
Admission: RE | Admit: 2014-10-21 | Discharge: 2014-10-21 | Disposition: A | Discharge status: Home / Self Care | Payer: No Typology Code available for payment source | Source: Ambulatory Visit | Attending: Sports Medicine | Admitting: Sports Medicine

## 2014-10-21 DIAGNOSIS — M545 Low back pain: Secondary | ICD-10-CM | POA: Diagnosis not present

## 2014-10-21 DIAGNOSIS — M47816 Spondylosis without myelopathy or radiculopathy, lumbar region: Secondary | ICD-10-CM

## 2014-10-22 ENCOUNTER — Encounter: Payer: Self-pay | Admitting: Sports Medicine

## 2014-10-23 ENCOUNTER — Ambulatory Visit (INDEPENDENT_AMBULATORY_CARE_PROVIDER_SITE_OTHER): Payer: Medicare Other | Admitting: Family Medicine

## 2014-10-23 VITALS — BP 110/66 | HR 97 | Temp 99.2°F | Ht 65.0 in | Wt 301.0 lb

## 2014-10-23 DIAGNOSIS — R635 Abnormal weight gain: Secondary | ICD-10-CM | POA: Diagnosis not present

## 2014-10-23 NOTE — Progress Notes (Signed)
   Subjective:    Patient ID: Regina Houston, female    DOB: [birth year], 63 y.o.   MRN: [id number]  HPI  Regina Houston reports to office for BP/wt check for phentermine refill. She has gained 3 lbs and stated that this just wasn't a great month for her with the holiday and family gatherings. [name], CMA    Review of Systems     Objective:   Physical Exam        Assessment & Plan:  I will refill one more month but we need to document what her exercise program is for the next 30 days and a helmet calories she is going to eat daily for the next 30 days. Please document this in the neck before we refill the medication.  [name], MD

## 2014-10-24 ENCOUNTER — Other Ambulatory Visit: Payer: Self-pay | Admitting: *Deleted

## 2014-10-24 MED ORDER — PHENTERMINE HCL 37.5 MG PO CAPS
37.5000 mg | ORAL_CAPSULE | ORAL | Status: DC
Start: 2014-10-24 — End: 2014-12-12

## 2014-10-24 NOTE — Progress Notes (Signed)
Patient notified. [name], CMA

## 2014-10-30 MED ORDER — FENTANYL CITRATE 0.05 MG/ML IJ SOLN
25.0000 ug | INTRAMUSCULAR | Status: DC | PRN
  Administered 2014-10-31: 50 ug via INTRAVENOUS

## 2014-10-30 MED ORDER — KETOROLAC TROMETHAMINE 30 MG/ML IJ SOLN
30.0000 mg | Freq: Once | INTRAMUSCULAR | Status: AC
  Administered 2014-10-31: 30 mg via INTRAVENOUS

## 2014-10-30 MED ORDER — SODIUM CHLORIDE 0.9 % IV SOLN
Freq: Once | INTRAVENOUS | Status: AC
  Administered 2014-10-31: 08:00:00 via INTRAVENOUS

## 2014-10-30 MED ORDER — MIDAZOLAM HCL 2 MG/2ML IJ SOLN
1.0000 mg | INTRAMUSCULAR | Status: DC | PRN
  Administered 2014-10-31: 2 mg via INTRAVENOUS

## 2014-10-31 ENCOUNTER — Telehealth: Payer: Self-pay | Admitting: *Deleted

## 2014-10-31 ENCOUNTER — Other Ambulatory Visit: Payer: Self-pay | Admitting: Sports Medicine

## 2014-10-31 ENCOUNTER — Other Ambulatory Visit: Payer: Self-pay | Admitting: Family Medicine

## 2014-10-31 ENCOUNTER — Ambulatory Visit
Admission: RE | Admit: 2014-10-31 | Discharge: 2014-10-31 | Disposition: A | Discharge status: Home / Self Care | Payer: No Typology Code available for payment source | Source: Ambulatory Visit | Attending: Sports Medicine | Admitting: Sports Medicine

## 2014-10-31 DIAGNOSIS — M47816 Spondylosis without myelopathy or radiculopathy, lumbar region: Secondary | ICD-10-CM

## 2014-10-31 DIAGNOSIS — M545 Low back pain: Secondary | ICD-10-CM | POA: Diagnosis not present

## 2014-10-31 MED ORDER — METHYLPREDNISOLONE ACETATE 40 MG/ML INJ SUSP (RADIOLOG
80.0000 mg | Freq: Once | INTRAMUSCULAR | Status: AC
  Administered 2014-10-31: 80 mg via INTRALESIONAL

## 2014-10-31 NOTE — Discharge Instructions (Signed)
Radio Frequency Ablation Post Procedure Discharge Instructions ° °1. May resume a regular diet and any medications that you routinely take (including pain medications). °2. No driving day of procedure. °3. Upon discharge go home and rest for at least 4 hours.  May use an ice pack as needed to injection sites on back. °4. Remove bandades later, today. ° ° ° °Please contact our office at [phone] for the following symptoms: ° °· Fever greater than 100 degrees °· Increased swelling, pain, or redness at injection site. ° ° °Thank you for visiting [facility]. °

## 2014-10-31 NOTE — Telephone Encounter (Signed)
Pt called and wanted to know if Dr. Madilyn Fireman would send something in for her cough and congestion. She stated that her sxs began about 7-10 days. And when she came in for BP and weight check and she was wanting to see someone then and there were no appts. I advised her to take mucinex she stated that she had mucinex-d she was told not to use that and get regular mucinex b/c of bp. And that if her sxs became worse to call back and make an appt. Pt voiced understanding and agreed.Audelia Hives Curtice

## 2014-11-06 ENCOUNTER — Other Ambulatory Visit: Payer: Self-pay | Admitting: Sports Medicine

## 2014-11-21 ENCOUNTER — Other Ambulatory Visit: Payer: Self-pay | Admitting: Sports Medicine

## 2014-12-05 ENCOUNTER — Other Ambulatory Visit: Payer: Self-pay | Admitting: Sports Medicine

## 2014-12-12 ENCOUNTER — Ambulatory Visit (INDEPENDENT_AMBULATORY_CARE_PROVIDER_SITE_OTHER): Payer: Medicare Other | Admitting: Sports Medicine

## 2014-12-12 ENCOUNTER — Encounter: Payer: Self-pay | Admitting: Sports Medicine

## 2014-12-12 ENCOUNTER — Ambulatory Visit (INDEPENDENT_AMBULATORY_CARE_PROVIDER_SITE_OTHER): Payer: Medicare Other | Admitting: Family Medicine

## 2014-12-12 ENCOUNTER — Encounter: Payer: Self-pay | Admitting: Family Medicine

## 2014-12-12 VITALS — BP 122/78 | HR 76 | Ht 65.0 in | Wt 289.0 lb

## 2014-12-12 VITALS — BP 142/72 | HR 76 | Ht 65.0 in | Wt 289.0 lb

## 2014-12-12 DIAGNOSIS — E119 Type 2 diabetes mellitus without complications: Secondary | ICD-10-CM | POA: Diagnosis not present

## 2014-12-12 DIAGNOSIS — R635 Abnormal weight gain: Secondary | ICD-10-CM

## 2014-12-12 DIAGNOSIS — M47816 Spondylosis without myelopathy or radiculopathy, lumbar region: Secondary | ICD-10-CM | POA: Diagnosis not present

## 2014-12-12 LAB — POCT GLYCOSYLATED HEMOGLOBIN (HGB A1C): Hemoglobin A1C: 6.4

## 2014-12-12 MED ORDER — PHENTERMINE HCL 37.5 MG PO CAPS: 37.5000 mg | ORAL_CAPSULE | ORAL | Status: DC

## 2014-12-12 MED ORDER — TRAMADOL HCL 50 MG PO TABS: ORAL_TABLET | ORAL | Status: DC

## 2014-12-12 NOTE — Progress Notes (Signed)
   Subjective:    Patient ID: Regina Houston, female    DOB: 1951-05-09, 64 y.o.   MRN: [id number]  HPI Has really been dieting and watching her food choices.  Says really tried to be careful around the [facility]. Has been walking more. She is walking every day.   She also increased her work to 3 days a week and feels like this has been helping her as well..  She can tell a difference in her clothing. She's been doing very well with the phentermine without any side effects. No chest pain, short breath, palpitations. It's not causing any insomnia.  Diabetes - no hypoglycemic events. No wounds or sores that are not healing well. No increased thirst or urination. Checking glucose at home. Taking medications as prescribed without any side effects.   Review of Systems     Objective:   Physical Exam  Constitutional: She is oriented to person, place, and time. She appears well-developed and well-nourished.  HENT:  Head: Normocephalic and atraumatic.  Cardiovascular: Normal rate, regular rhythm and normal heart sounds.   Pulmonary/Chest: Effort normal and breath sounds normal.  Neurological: She is alert and oriented to person, place, and time.  Skin: Skin is warm and dry.  Psychiatric: She has a normal mood and affect. Her behavior is normal.          Assessment & Plan:  Abnormal weight gain-she's down 12 pounds from last office visit 4 weeks ago which is fantastic. Continue with current lifestyle changes which are clearly making a significant impact. Blood pressure looks fantastic today. We'll go ahead and refill the phentermine today. Follow up in one month for blood pressure and weight check.  Diabetes-well-controlled. Continue current regimen. Follow up in 3-4 months.

## 2014-12-12 NOTE — Progress Notes (Signed)
  Subjective:    CC: Follow-up after radiofrequency ablation  HPI: Returns, we have had a great deal of difficulty locating her pain generator, more recently she responded well to a right-sided L5-S1 facet injection with 2 months of pain relief. Subsequent proceeded with a right-sided medial branch block and facet RFA at the L5-S1 level. She returns today approximately 90% pain free. With regards to her left side, she is starting to have some pain of a similar nature, with similar palliating, precipitating factors, similar radiation, and similar location but only on the left side. 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  Past medical history, Surgical history, Family history not pertinant except as noted below, Social history, Allergies, and medications have been entered into the medical record, reviewed, and no changes needed.   Review of Systems: No fevers, chills, night sweats, weight loss, chest pain, or shortness of breath.   Objective:    General: Well Developed, well nourished, and in no acute distress.  Neuro: Alert and oriented x3, extra-ocular muscles intact, sensation grossly intact.  HEENT: Normocephalic, atraumatic, pupils equal round reactive to light, neck supple, no masses, no lymphadenopathy, thyroid nonpalpable.  Skin: Warm and dry, no rashes. Cardiac: Regular rate and rhythm, no murmurs rubs or gallops, no lower extremity edema.  Respiratory: Clear to auscultation bilaterally. Not using accessory muscles, speaking in full sentences.  Impression and Recommendations:

## 2014-12-12 NOTE — Assessment & Plan Note (Signed)
Regina Houston now has continuous 90% relief after her right-sided L5-S1 facet radiofrequency ablation. She started to have pain on the left side, moderate, persistent, not as severe as the right side, and not yet ready proceed. When she is ready I'm happy to set her up for medial branch blocks and subsequently radio frequency ablation of the left L5-S1 facet, I am happy to order this without an office visit. Interestingly her left L5-S1 facet does appear more arthritic than her right. Refilling tramadol for now, she was able to discontinue her Lyrica.

## 2014-12-30 ENCOUNTER — Other Ambulatory Visit: Payer: Self-pay | Admitting: Sports Medicine

## 2015-01-10 ENCOUNTER — Encounter: Payer: Self-pay | Admitting: Sports Medicine

## 2015-01-10 ENCOUNTER — Ambulatory Visit (INDEPENDENT_AMBULATORY_CARE_PROVIDER_SITE_OTHER): Payer: Medicare Other | Admitting: Sports Medicine

## 2015-01-10 ENCOUNTER — Ambulatory Visit (INDEPENDENT_AMBULATORY_CARE_PROVIDER_SITE_OTHER): Payer: Medicare Other

## 2015-01-10 VITALS — BP 146/84 | HR 67 | Wt 280.0 lb

## 2015-01-10 DIAGNOSIS — M79672 Pain in left foot: Secondary | ICD-10-CM

## 2015-01-10 DIAGNOSIS — M19071 Primary osteoarthritis, right ankle and foot: Secondary | ICD-10-CM | POA: Diagnosis not present

## 2015-01-10 DIAGNOSIS — M2142 Flat foot [pes planus] (acquired), left foot: Secondary | ICD-10-CM | POA: Diagnosis not present

## 2015-01-10 NOTE — Assessment & Plan Note (Signed)
There is a flatfoot deformity worse on the left than the right with tenderness behind the medial malleolus suggestive of tibialis posterior tendinitis/dysfunction. She does have some bruising but she also has good strength to internal rotation and inversion so I do not suspect complete rupture. Scaphoid pads placed bilaterally. She has had orthotics in the past that were not effective. Formal physical therapy, x-rays, she has a boot at home that she will wear not in physical therapy. Return to see me in one month. At that point we will consider MRI versus a new set of orthotics. If there is no rupture in stable tendinitis she would be a candidate for injection however we would need to boot her up after the injection.

## 2015-01-10 NOTE — Progress Notes (Signed)
   Subjective:    I'm seeing this patient as a consultation for:  Dr. Madilyn Fireman  CC: Left ankle pain  HPI: This is a pleasant 64 year old female, seen her in the past for facet arthritis and she's done well after a radio frequency ablation. Unfortunately for the past several weeks she's had increasing pain behind the medial malleolus of her left ankle, with occasional bruising and worsening of a flatfoot deformity. Pain is moderate, persistent without radiation. No trauma.  Past medical history, Surgical history, Family history not pertinant except as noted below, Social history, Allergies, and medications have been entered into the medical record, reviewed, and no changes needed.   Review of Systems: No headache, visual changes, nausea, vomiting, diarrhea, constipation, dizziness, abdominal pain, skin rash, fevers, chills, night sweats, weight loss, swollen lymph nodes, body aches, joint swelling, muscle aches, chest pain, shortness of breath, mood changes, visual or auditory hallucinations.   Objective:   General: Well Developed, well nourished, and in no acute distress.  Neuro/Psych: Alert and oriented x3, extra-ocular muscles intact, able to move all 4 extremities, sensation grossly intact. Skin: Warm and dry, no rashes noted.  Respiratory: Not using accessory muscles, speaking in full sentences, trachea midline.  Cardiovascular: Pulses palpable, no extremity edema. Abdomen: Does not appear distended. Left Ankle: Visible bruising behind the medial malleolus Range of motion is full in all directions. Strength is 5/5 in all directions. Stable lateral and medial ligaments; squeeze test and kleiger test unremarkable; Talar dome nontender; No pain at base of 5th MT; No tenderness over cuboid; No tenderness over N spot or navicular prominence Tender to palpation behind the medial malleolus without reproduction of pain with resisted inversion, good strength. No sign of peroneal tendon  subluxations; Negative tarsal tunnel tinel's Able to walk 4 steps. Visible worsening flatfoot deformity when compared to the contralateral side.  Scaphoid pads placed in both shoes.  Impression and Recommendations:   This case required medical decision making of moderate complexity.

## 2015-01-10 NOTE — Patient Instructions (Signed)
PT, boot for 1 month.

## 2015-01-14 ENCOUNTER — Ambulatory Visit: Payer: Medicare Other

## 2015-01-16 ENCOUNTER — Other Ambulatory Visit: Payer: Self-pay | Admitting: Family Medicine

## 2015-01-17 ENCOUNTER — Ambulatory Visit (INDEPENDENT_AMBULATORY_CARE_PROVIDER_SITE_OTHER): Payer: Medicare Other | Admitting: Family Medicine

## 2015-01-17 VITALS — BP 115/63 | HR 89 | Wt 280.0 lb

## 2015-01-17 DIAGNOSIS — R635 Abnormal weight gain: Secondary | ICD-10-CM

## 2015-01-17 MED ORDER — PHENTERMINE HCL 37.5 MG PO CAPS: 37.5000 mg | ORAL_CAPSULE | ORAL | Status: DC

## 2015-01-17 NOTE — Progress Notes (Signed)
   Subjective:    Patient ID: Regina Houston, female    DOB: [birth year], 64 y.o.   MRN: [id number]  HPI  Dulcy is here for blood pressure and weight check. She is not excersing as much due to foot pain. Denies palpitations or insomnia on the phentermine.   Review of Systems     Objective:   Physical Exam        Assessment & Plan:  Abnormal weight gain - Patient has lost weight. Approximately 9 pounds over the last 4 weeks. A refill of phentermine will be faxed to [facility]. Continue current regimen. Blood pressure at goal. Follow-up in 4 weeks.  [name], MD

## 2015-01-22 ENCOUNTER — Encounter: Payer: Self-pay | Admitting: Physical Therapy

## 2015-01-22 ENCOUNTER — Ambulatory Visit (INDEPENDENT_AMBULATORY_CARE_PROVIDER_SITE_OTHER): Payer: Medicare Other | Admitting: Physical Therapy

## 2015-01-22 DIAGNOSIS — M25672 Stiffness of left ankle, not elsewhere classified: Secondary | ICD-10-CM

## 2015-01-22 DIAGNOSIS — R29898 Other symptoms and signs involving the musculoskeletal system: Secondary | ICD-10-CM

## 2015-01-22 DIAGNOSIS — M25572 Pain in left ankle and joints of left foot: Secondary | ICD-10-CM

## 2015-01-22 NOTE — Patient Instructions (Signed)
Eversion: Resisted  CAN DO THIS ONE SITTING UP IF YOU WANT With right foot in tubing loop, hold tubing around other foot to resist and turn foot out. Repeat _10___ times per set. Do __3__ sets per session. Do __1__ sessions per day.  http://orth.exer.us/14   Copyright  VHI. All rights reserved.  Inversion: Resisted   Cross legs with right leg underneath, foot in tubing loop. Hold tubing around other foot to resist and turn foot in. Repeat _10___ times per set. Do _1___ sets per session. Do __1__ sessions per day.  http://orth.exer.us/12   Copyright  VHI. All rights reserved.  Gastroc Stretch   Stand with right foot back, leg straight, forward leg bent. Keeping heel on floor, turned slightly out, lean into wall until stretch is felt in calf. Hold 30-45____ seconds. Repeat __1-2__ times per set. Do __1__ sets per session. Do __1-2__ sessions per day.  http://orth.exer.us/26   Copyright  VHI. All rights reserved.  Balance: Unilateral   Attempt to balance on left leg, eyes open. Hold as long as you are able, goal is 30 sec. Repeat _5-10___ times per set. Do __1__ sets per session. Do __1__ sessions per day. Perform exercise with eyes closed.  http://orth.exer.us/28   Copyright  VHI. All rights reserved.

## 2015-01-22 NOTE — Therapy (Signed)
[place], [place], [address] Phone: [phone]   Fax:  [phone]  Physical Therapy Evaluation  Patient Details  Name: Regina Houston MRN: [id number] Date of Birth: Oct 15, 1951 Referring Provider:  Silverio Decamp,*  Encounter Date: 01/22/2015      PT End of Session - 01/22/15 0854    PT Start Time 0854   PT Stop Time 0934   PT Time Calculation (min) 40 min      Past Medical History  Diagnosis Date  . Diverticulitis   . Diabetes mellitus 2007  . Hyperlipidemia   . Obesity     Past Surgical History  Procedure Laterality Date  . Abdominal hysterectomy  2008    fibroids  . 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 Foot surgery  2004    hammer toe  . Biopsy breast  2007    benign calcification    There were no vitals taken for this visit.  Visit Diagnosis:  Pain in joint, ankle and foot, left - Plan: PT plan of care cert/re-cert  Ankle weakness - Plan: PT plan of care cert/re-cert  Stiffness of ankle joint, left - Plan: PT plan of care cert/re-cert      Subjective Assessment - 01/22/15 0856    Symptoms Patient reports onset of Lt foot pain a couple wks ago, she was at work wearing her orthotics all day at end of day she had a bruise on inside of ankle.    Pertinent History Patient is in CAM boot for one month, try PT and then return to MD    Diagnostic tests x-rays were negative for fx or acute injury   Patient Stated Goals not have to wear boot, increase strength in ankle   Pain Score 4    Pain Location Ankle   Pain Orientation Left   Pain Descriptors / Indicators --  toothache feeling   Pain Radiating Towards both sides of Lt ankle   Pain Onset 1 to 4 weeks ago   Aggravating Factors  standing and walking   Pain Relieving Factors boot          [facility] PT Assessment - 01/22/15 0001    Assessment   Medical Diagnosis Lt foot pain   Onset Date 01/08/15   Next MD Visit 02/10/15   Restrictions   Other Position/Activity Restrictions CAM boot with walking   Balance Screen   Has the patient fallen in the past 6 months No   Has the patient had a decrease in activity level because of a fear of falling?  No   Is the patient reluctant to leave their home because of a fear of falling?  No   Prior Function   Level of Independence --  I with all activities   Observation/Other Assessments   Focus on Therapeutic Outcomes (FOTO)  59% limited   Posture/Postural Control   Posture/Postural Control Postural limitations   Postural Limitations --  pes planus  and extra soft tissue around joint   ROM / Strength   AROM / PROM / Strength AROM;PROM;Strength   AROM   AROM Assessment Site Ankle   Right/Left Ankle Left   Left Ankle Dorsiflexion 3   Left Ankle Plantar Flexion 47   Left Ankle Inversion 30   Left Ankle Eversion 25   PROM   PROM Assessment Site Ankle   Right/Left Ankle Left   Left Ankle Dorsiflexion 9   Left Ankle Plantar  Flexion 49   Strength   Overall Strength Comments Lt knee and hip WNL   Strength Assessment Site Ankle   Right/Left Ankle Left   Left Ankle Dorsiflexion 4+/5   Left Ankle Plantar Flexion 4-/5   Left Ankle Inversion 4+/5   Left Ankle Eversion 4+/5   Palpation   Palpation tight posterior Lt talus mobs, tenderness distal to Lt medial malleoli   Standardized Balance Assessment   Standardized Balance Assessment --  Single leg stance Lt 2 sec, Rt  3 sec.                   [facility] Adult PT Treatment/Exercise - 01/22/15 0001    Exercises   Exercises Ankle   Ankle Exercises: Stretches   Gastroc Stretch 30 seconds   Ankle Exercises: Standing   Other Standing Ankle Exercises SLS x 5 attempts max hold   Ankle Exercises: Supine   T-Band red band, inversion/everision 3x10                PT Education - 01/22/15 0921    Education provided Yes   Education Details HEP per handout   Person(s) Educated  Patient   Methods Explanation;Demonstration;Handout   Comprehension Returned demonstration             PT Long Term Goals - 01/22/15 0940    PT LONG TERM GOAL #1   Title I with HEP   Time 4   Period Weeks   Status New   PT LONG TERM GOAL #2   Title Increase Lt ankle dorsiflexion =/> 15 degrees to assist gait   Time 4   Period Weeks   Status New   PT LONG TERM GOAL #3   Title increase strength Lt ankle =/> 5-/5 to support leg with standing   Time 4   Period Weeks   Status New   PT LONG TERM GOAL #4   Title improve FOTO =/< CJ level   PT LONG TERM GOAL #5   Title tolerate being out of CAM boot without increased Lt ankle pain   Time 4   Period Weeks   Status New               Plan - 01/22/15 3875    Clinical Impression Statement Patient presents with intermiitent medial Lt ankle pain, her CAM boot has helped decrease this. She has tightness in the ankle and some weakness.  She wishes to wean out of the boot and return to her prior level of activity    Pt will benefit from skilled therapeutic intervention in order to improve on the following deficits Decreased strength;Pain;Obesity;Difficulty walking;Decreased range of motion   Rehab Potential Excellent   PT Frequency 2x / week   PT Duration 4 weeks   PT Treatment/Interventions Moist Heat;Patient/family education;Therapeutic exercise;Passive range of motion;Ultrasound;Balance training;Manual techniques;Cryotherapy;Electrical Stimulation  iontophoresis with dexamathasone 3ml/mg   PT Next Visit Plan assess tolerance to HEP, stregthening, balance, modalities PRN   Consulted and Agree with Plan of Care Patient         Problem List Patient Active Problem List   Diagnosis Date Noted  . Left foot pain 01/10/2015  . Severe obesity (BMI >= 40) 08/28/2014  . Cataract 08/12/2014  . Unspecified hypothyroidism 05/28/2014  . Lumbar spondylosis 02/26/2014  . Adult hypothyroidism 02/01/2014  . HLD (hyperlipidemia)  02/01/2014  . Essential hypertension 09/20/2011  . Osteoarthritis of both knees 09/20/2011  . MORBID OBESITY 04/22/2010  . POSTMENOPAUSAL STATUS 04/22/2010  .  TROCHANTERIC BURSITIS, RIGHT 12/03/2009  . Goiter 04/01/2009  . Diabetes mellitus 04/01/2009    Jeral Pinch, PT 01/22/2015, 9:50 AM  [facility] [place], [place], [address] Phone: [phone]   Fax:  [phone]

## 2015-01-23 ENCOUNTER — Other Ambulatory Visit: Payer: Self-pay | Admitting: Family Medicine

## 2015-01-27 ENCOUNTER — Ambulatory Visit (INDEPENDENT_AMBULATORY_CARE_PROVIDER_SITE_OTHER): Payer: Medicare Other | Admitting: Physical Therapy

## 2015-01-27 DIAGNOSIS — M25572 Pain in left ankle and joints of left foot: Secondary | ICD-10-CM

## 2015-01-27 DIAGNOSIS — M25672 Stiffness of left ankle, not elsewhere classified: Secondary | ICD-10-CM

## 2015-01-27 DIAGNOSIS — R29898 Other symptoms and signs involving the musculoskeletal system: Secondary | ICD-10-CM

## 2015-01-27 NOTE — Therapy (Signed)
[place], [place], [address] Phone: [phone]   Fax:  [phone]  Physical Therapy Treatment  Patient Details  Name: Regina Houston MRN: [id number] Date of Birth: 1951/09/11 Referring Provider:  Silverio Decamp,*  Encounter Date: 01/27/2015      PT End of Session - 01/27/15 1020    Visit Number 2   PT Start Time 1017   PT Stop Time 1102   PT Time Calculation (min) 45 min      Past Medical History  Diagnosis Date  . Diverticulitis   . Diabetes mellitus 2007  . Hyperlipidemia   . Obesity     Past Surgical History  Procedure Laterality Date  . Abdominal hysterectomy  2008    fibroids  . 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 Foot surgery  2004    hammer toe  . Biopsy breast  2007    benign calcification    There were no vitals taken for this visit.  Visit Diagnosis:  Pain in joint, ankle and foot, left  Ankle weakness  Stiffness of ankle joint, left      Subjective Assessment - 01/27/15 1210    Symptoms Pt reports  "soreness" in bottom of foot with WB with boot on Lt foot.  Pt reports she has been performing HEP 2-3x /day.     Pertinent History Patient is in CAM boot for one month, try PT and then return to MD    Currently in Pain? No/denies          [facility] PT Assessment - 01/27/15 0001    Assessment   Medical Diagnosis Lt foot pain   Onset Date 01/08/15   Next MD Visit 02/10/15   Restrictions   Other Position/Activity Restrictions CAM boot with walking   ROM / Strength   AROM / PROM / Strength AROM   AROM   AROM Assessment Site Ankle   Right/Left Ankle Left   Left Ankle Dorsiflexion 7   Left Ankle Plantar Flexion 45   Left Ankle Inversion 30   Left Ankle Eversion 20                  [facility] Adult PT Treatment/Exercise - 01/27/15 0001    Exercises   Exercises Ankle   Manual Therapy   Manual Therapy Massage;Other (comment)   Massage ice massage to plantar fascia x 4 min   Other Manual Therapy MFR, Deep fiber friction to Lt plantar fascia. Adhesions noted at calcaneus to 5th met head.    Ankle Exercises: Stretches   Soleus Stretch 2 reps;30 seconds   Gastroc Stretch 2 reps;30 seconds   Ankle Exercises: Standing   Heel Raises 15 reps  (2 feet with UE support). painfree.   Other Standing Ankle Exercises SLS x 5 reps, only able to sustain 2-3 sec.    Ankle Exercises: Seated   Ankle Circles/Pumps AROM;Left;10 reps   Towel Crunch 2 reps;Weights  Inversion/Eversion    Towel Crunch Weights (lbs) --   Towel Inversion/Eversion 2 reps;Weights  each direction   Towel Inversion/Eversion Weights (lbs) 1   Towel Inversion/Eversion Limitations difficult with eversion, removed 1# for 2nd set   Ankle Exercises: Supine   T-Band Red band for inversion/eversion x 12 x 2 sets. DF with Bruinsma band x 12 x 2 sets                 PT Education - 01/27/15 1212  Education provided Yes   Education Details HEP- added towel crunches in PF, Inv, Eversion and DF seated with [place] band.    Person(s) Educated Patient   Methods Explanation;Demonstration;Handout   Comprehension Verbalized understanding;Returned demonstration             PT Long Term Goals - 01/22/15 0940    PT LONG TERM GOAL #1   Title I with HEP   Time 4   Period Weeks   Status New   PT LONG TERM GOAL #2   Title Increase Lt ankle dorsiflexion =/> 15 degrees to assist gait   Time 4   Period Weeks   Status New   PT LONG TERM GOAL #3   Title increase strength Lt ankle =/> 5-/5 to support leg with standing   Time 4   Period Weeks   Status New   PT LONG TERM GOAL #4   Title improve FOTO =/< CJ level   PT LONG TERM GOAL #5   Title tolerate being out of CAM boot without increased Lt ankle pain   Time 4   Period Weeks   Status New               Plan - 01/27/15 1214    Clinical Impression Statement Pt demonstrated improved Lt DF. Pt  tolerated all new exercises with minimal increase in symptoms. Pt reports some increased lateral Lt ankle pain with SLS, resolved with even WB between feet.   Pt reported decreased soreness in Lt foot (plantar surface) after cross fiber and ice massage.     Pt will benefit from skilled therapeutic intervention in order to improve on the following deficits Decreased strength;Pain;Obesity;Difficulty walking;Decreased range of motion   Rehab Potential Excellent   PT Frequency 2x / week   PT Duration 4 weeks   PT Treatment/Interventions Moist Heat;Patient/family education;Therapeutic exercise;Passive range of motion;Ultrasound;Balance training;Manual techniques;Cryotherapy;Electrical Stimulation   PT Next Visit Plan Continue per plan of care.  Progress HEP.    Consulted and Agree with Plan of Care Patient        Problem List Patient Active Problem List   Diagnosis Date Noted  . Left foot pain 01/10/2015  . Severe obesity (BMI >= 40) 08/28/2014  . Cataract 08/12/2014  . Unspecified hypothyroidism 05/28/2014  . Lumbar spondylosis 02/26/2014  . Adult hypothyroidism 02/01/2014  . HLD (hyperlipidemia) 02/01/2014  . Essential hypertension 09/20/2011  . Osteoarthritis of both knees 09/20/2011  . MORBID OBESITY 04/22/2010  . POSTMENOPAUSAL STATUS 04/22/2010  . TROCHANTERIC BURSITIS, RIGHT 12/03/2009  . Goiter 04/01/2009  . Diabetes mellitus 04/01/2009   Kerin Perna, PTA 01/27/2015 12:47 PM  [place], [place], [address] Phone: [phone]   Fax:  [phone]

## 2015-01-28 ENCOUNTER — Encounter: Payer: No Typology Code available for payment source | Admitting: Physical Therapy

## 2015-01-30 ENCOUNTER — Ambulatory Visit (INDEPENDENT_AMBULATORY_CARE_PROVIDER_SITE_OTHER): Payer: Medicare Other | Admitting: Physical Therapy

## 2015-01-30 ENCOUNTER — Other Ambulatory Visit: Payer: Self-pay | Admitting: Family Medicine

## 2015-01-30 ENCOUNTER — Other Ambulatory Visit: Payer: Self-pay | Admitting: Sports Medicine

## 2015-01-30 DIAGNOSIS — M25572 Pain in left ankle and joints of left foot: Secondary | ICD-10-CM | POA: Diagnosis not present

## 2015-01-30 DIAGNOSIS — M25672 Stiffness of left ankle, not elsewhere classified: Secondary | ICD-10-CM

## 2015-01-30 DIAGNOSIS — R29898 Other symptoms and signs involving the musculoskeletal system: Secondary | ICD-10-CM

## 2015-01-30 NOTE — Therapy (Signed)
[place], [place], [address] Phone: [phone]   Fax:  [phone]  Physical Therapy Treatment  Patient Details  Name: Regina Houston MRN: [id number] Date of Birth: Aug 19, 1951 Referring Provider:  Silverio Decamp,*  Encounter Date: 01/30/2015      PT End of Session - 01/30/15 0940    Visit Number 3   PT Start Time 0844   PT Stop Time 0930   PT Time Calculation (min) 46 min   Activity Tolerance Patient tolerated treatment well   Behavior During Therapy [facility] for tasks assessed/performed      Past Medical History  Diagnosis Date  . Diverticulitis   . Diabetes mellitus 2007  . Hyperlipidemia   . Obesity     Past Surgical History  Procedure Laterality Date  . Abdominal hysterectomy  2008    fibroids  . 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 Foot surgery  2004    hammer toe  . Biopsy breast  2007    benign calcification    There were no vitals taken for this visit.  Visit Diagnosis:  Pain in joint, ankle and foot, left  Ankle weakness  Stiffness of ankle joint, left      Subjective Assessment - 01/30/15 0845    Symptoms Feels "some better."  Bottom of foot still aching a little   Currently in Pain? Yes   Pain Score 3    Pain Location Ankle   Pain Orientation Left   Pain Descriptors / Indicators Sore;Aching   Pain Onset 1 to 4 weeks ago   Pain Frequency Constant                    [facility] Adult PT Treatment/Exercise - 01/30/15 0847    Manual Therapy   Manual Therapy Massage;Other (comment)   Other Manual Therapy MFR, Deep fiber friction to Lt plantar fascia. Adhesions noted at calcaneus to 5th met head.    Ankle Exercises: Aerobic   Stationary Bike NuStep Level 4 x 8 min; cues to keep heel on step   Ankle Exercises: Seated   BAPS Level 3;Sitting;15 reps;Other (comment)  DF/PF, INV/EV, CW/CCW   Other Seated Ankle Exercises ankle 4 way with  red tband  (Ramp tband for dorsiflexion) x 20 reps each                     PT Long Term Goals - 01/30/15 0941    PT LONG TERM GOAL #1   Title I with HEP   Time 4   Period Weeks   Status On-going   PT LONG TERM GOAL #2   Title Increase Lt ankle dorsiflexion =/> 15 degrees to assist gait   Time 4   Period Weeks   Status On-going   PT LONG TERM GOAL #3   Title increase strength Lt ankle =/> 5-/5 to support leg with standing   Time 4   Period Weeks   Status On-going   PT LONG TERM GOAL #4   Title improve FOTO =/< CJ level   Time 4   Period Weeks   Status On-going   PT LONG TERM GOAL #5   Title tolerate being out of CAM boot without increased Lt ankle pain   Time 4   Period Weeks   Status On-going               Plan - 01/30/15 2633  Clinical Impression Statement Pt reports improvement in L foot; still with some soreness but overall improved.  Progressing towards goals.   PT Next Visit Plan progress HEP and weight bearing activities   Consulted and Agree with Plan of Care Patient        Problem List Patient Active Problem List   Diagnosis Date Noted  . Left foot pain 01/10/2015  . Severe obesity (BMI >= 40) 08/28/2014  . Cataract 08/12/2014  . Unspecified hypothyroidism 05/28/2014  . Lumbar spondylosis 02/26/2014  . Adult hypothyroidism 02/01/2014  . HLD (hyperlipidemia) 02/01/2014  . Essential hypertension 09/20/2011  . Osteoarthritis of both knees 09/20/2011  . MORBID OBESITY 04/22/2010  . POSTMENOPAUSAL STATUS 04/22/2010  . TROCHANTERIC BURSITIS, RIGHT 12/03/2009  . Goiter 04/01/2009  . Diabetes mellitus 04/01/2009   Laureen Abrahams, PT, DPT 01/30/2015 9:43 AM  [facility] [place], [place], [address] Phone: [phone]   Fax:  [phone]

## 2015-02-03 ENCOUNTER — Ambulatory Visit (INDEPENDENT_AMBULATORY_CARE_PROVIDER_SITE_OTHER): Payer: Medicare Other | Admitting: Physical Therapy

## 2015-02-03 DIAGNOSIS — M259 Joint disorder, unspecified: Secondary | ICD-10-CM

## 2015-02-03 DIAGNOSIS — R29898 Other symptoms and signs involving the musculoskeletal system: Secondary | ICD-10-CM

## 2015-02-03 NOTE — Therapy (Signed)
[place], [place], [address] Phone: [phone]   Fax:  [phone]  Physical Therapy Treatment  Patient Details  Name: Regina Houston MRN: [id number] Date of Birth: [birth year] Referring Provider:  Silverio Decamp,*  Encounter Date: 02/03/2015      PT End of Session - 02/03/15 0914    Visit Number 4   PT Start Time 0837   PT Stop Time 0925   PT Time Calculation (min) 48 min   Activity Tolerance Patient tolerated treatment well      Past Medical History  Diagnosis Date  . Diverticulitis   . Diabetes mellitus 2007  . Hyperlipidemia   . Obesity     Past Surgical History  Procedure Laterality Date  . Abdominal hysterectomy  2008    fibroids  . 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 Foot surgery  2004    hammer toe  . Biopsy breast  2007    benign calcification    There were no vitals filed for this visit.  Visit Diagnosis:  Ankle weakness      Subjective Assessment - 02/03/15 0848    Symptoms lt ankle feeling better. bottom is worse. not wearing boot to clinic today   Diagnostic tests x-rays were negative for fx or acute injury   Currently in Pain? No/denies            [facility] PT Assessment - 02/03/15 0001    AROM   AROM Assessment Site Ankle   Right/Left Ankle Left   Left Ankle Dorsiflexion 15   Left Ankle Plantar Flexion 47   Left Ankle Inversion 35   Left Ankle Eversion 25   Strength   Strength Assessment Site Ankle   Right/Left Ankle Left   Left Ankle Dorsiflexion 5/5   Left Ankle Plantar Flexion 5/5   Left Ankle Inversion 5/5   Left Ankle Eversion 4+/5                   [facility] Adult PT Treatment/Exercise - 02/03/15 0001    Ankle Exercises: Aerobic   Stationary Bike nu step  level 6 X 6'   Ankle Exercises: Standing   Heel Raises 20 reps   Toe Raise 15 reps   Other Standing Ankle Exercises lateral step up on steps   Other  Standing Ankle Exercises fwd step up and SLS with UE support   Ankle Exercises: Stretches   Soleus Stretch 2 reps;30 seconds   Gastroc Stretch 2 reps;30 seconds   Ankle Exercises: Seated   Other Seated Ankle Exercises eversion with foot to wall.  isometrics 5" holds X 10                PT Education - 02/03/15 0909    Education provided Yes  hand outs for step up and downs given   Person(s) Educated Patient   Methods Explanation;Demonstration;Verbal cues;Handout   Comprehension Verbalized understanding;Returned demonstration;Verbal cues required             PT Long Term Goals - 01/30/15 0941    PT LONG TERM GOAL #1   Title I with HEP   Time 4   Period Weeks   Status On-going   PT LONG TERM GOAL #2   Title Increase Lt ankle dorsiflexion =/> 15 degrees to assist gait   Time 4   Period Weeks   Status On-going   PT LONG TERM GOAL #3   Title  increase strength Lt ankle =/> 5-/5 to support leg with standing   Time 4   Period Weeks   Status On-going   PT LONG TERM GOAL #4   Title improve FOTO =/< CJ level   Time 4   Period Weeks   Status On-going   PT LONG TERM GOAL #5   Title tolerate being out of CAM boot without increased Lt ankle pain   Time 4   Period Weeks   Status On-going               Plan - 02/03/15 0916    Clinical Impression Statement patient did well today, has less fatigue and pain with treatment   Rehab Potential Excellent   PT Frequency 2x / week   PT Duration 4 weeks   PT Next Visit Plan Assess for renewal or discharge, has doctor apt on Monday following.    PT Home Exercise Plan standing HEP given today   Consulted and Agree with Plan of Care Patient        Problem List Patient Active Problem List   Diagnosis Date Noted  . Left foot pain 01/10/2015  . Severe obesity (BMI >= 40) 08/28/2014  . Cataract 08/12/2014  . Unspecified hypothyroidism 05/28/2014  . Lumbar spondylosis 02/26/2014  . Adult hypothyroidism 02/01/2014   . HLD (hyperlipidemia) 02/01/2014  . Essential hypertension 09/20/2011  . Osteoarthritis of both knees 09/20/2011  . MORBID OBESITY 04/22/2010  . POSTMENOPAUSAL STATUS 04/22/2010  . TROCHANTERIC BURSITIS, RIGHT 12/03/2009  . Goiter 04/01/2009  . Diabetes mellitus 04/01/2009     Natividad Brood, PTA  02/03/2015, 9:20 AM  [facility] [place], [place], [address] Phone: [phone]   Fax:  [phone]

## 2015-02-03 NOTE — Patient Instructions (Signed)
Heel Raise: Standing   Toes on board, heels on floor, knees slightly bent, rise up on toes as high as possible. Do ____ sets. Complete ____ repetitions.  http://st.exer.us/132   Copyright  VHI. All rights reserved.  Lunge: Stationary   In wide stride, legs shoulder width apart, head up, back straight, bend both legs simultaneously until forward thigh is parallel to floor. Do all repetitions to one side. Repeat on other side. Do ____ sets. Complete ____ repetitions.  http://st.exer.us/308   Copyright  VHI. All rights reserved.

## 2015-02-06 ENCOUNTER — Ambulatory Visit (INDEPENDENT_AMBULATORY_CARE_PROVIDER_SITE_OTHER): Payer: Medicare Other | Admitting: Physical Therapy

## 2015-02-06 DIAGNOSIS — M25572 Pain in left ankle and joints of left foot: Secondary | ICD-10-CM

## 2015-02-06 DIAGNOSIS — M25672 Stiffness of left ankle, not elsewhere classified: Secondary | ICD-10-CM

## 2015-02-06 DIAGNOSIS — M259 Joint disorder, unspecified: Secondary | ICD-10-CM | POA: Diagnosis not present

## 2015-02-06 DIAGNOSIS — R29898 Other symptoms and signs involving the musculoskeletal system: Secondary | ICD-10-CM

## 2015-02-06 NOTE — Therapy (Signed)
[place], [place], [address] Phone: [phone]   Fax:  [phone]  Physical Therapy Treatment  Patient Details  Name: Regina Houston MRN: [id number] Date of Birth: January 22, 1951 Referring Provider:  Silverio Decamp,*  Encounter Date: 02/06/2015      PT End of Session - 02/06/15 0850    Visit Number 5   Number of Visits 8   Date for PT Re-Evaluation 02/19/15   PT Start Time 0848   PT Stop Time 0930   PT Time Calculation (min) 42 min   Activity Tolerance Patient tolerated treatment well   Behavior During Therapy [facility] for tasks assessed/performed      Past Medical History  Diagnosis Date  . Diverticulitis   . Diabetes mellitus 2007  . Hyperlipidemia   . Obesity     Past Surgical History  Procedure Laterality Date  . Abdominal hysterectomy  2008    fibroids  . 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 Foot surgery  2004    hammer toe  . Biopsy breast  2007    benign calcification    There were no vitals filed for this visit.  Visit Diagnosis:  Ankle weakness  Pain in joint, ankle and foot, left  Stiffness of ankle joint, left      Subjective Assessment - 02/06/15 0850    Symptoms Pt reported she mowed her grass yesterday without boot on.  Back and foot a little more sore today. After doing steps in therapy Monday, "side of foot really bothered me the next day"    Currently in Pain? Yes   Pain Score 2    Pain Location Ankle   Pain Orientation Left   Pain Descriptors / Indicators Aching;Sore   Aggravating Factors  stairs, walking    Pain Relieving Factors boot, ice    Multiple Pain Sites Yes   Pain Score 4   Pain Location Back   Pain Orientation Lower   Pain Descriptors / Indicators Sharp            [facility] PT Assessment - 02/06/15 0001    Assessment   Medical Diagnosis Lt foot pain   Onset Date 01/08/15   Next MD Visit 02/10/15   Restrictions   Other Position/Activity Restrictions CAM boot with walking   AROM   AROM Assessment Site Ankle   Right/Left Ankle Left   Left Ankle Dorsiflexion 15   Left Ankle Plantar Flexion 47   Left Ankle Inversion 35   Left Ankle Eversion 25   Strength   Strength Assessment Site Ankle   Right/Left Ankle Left   Left Ankle Dorsiflexion 5/5   Left Ankle Plantar Flexion 5/5   Left Ankle Inversion 5/5   Left Ankle Eversion 4+/5                   [facility] Adult PT Treatment/Exercise - 02/06/15 0001    High Level Balance   High Level Balance Activities Braiding;Backward walking;Turns;Tandem walking   High Level Balance Comments Pt demo difficulty with braiding and tandem walking. Freq LOB, but able to self correct by side stepping.    Exercises   Exercises Ankle   Manual Therapy   Manual Therapy Massage;Other (comment)   Other Manual Therapy MFR, Deep fiber friction to Lt plantar fascia. Adhesions noted at calcaneus to 5th met head.    Ankle Exercises: Stretches   Soleus Stretch 2 reps;30 seconds   Gastroc Stretch  2 reps;30 seconds   Ankle Exercises: Aerobic   Stationary Bike NuStep level 4 - 5 min.    Ankle Exercises: Standing   SLS 4 trials, pt able to hold up to 7 sec.    Heel Raises 20 reps  10 toes straight, 10 toes out   Toe Raise 20 reps   Other Standing Ankle Exercises Lt lateral step ups on 6" step x 15.    Ankle Exercises: Seated   Other Seated Ankle Exercises Eversion and Inversion with Christner band x 12 reps Lt ankle.    Ankle Exercises: Supine   T-Band Lt DF with Strike band x 12                      PT Long Term Goals - 02/06/15 1043    PT LONG TERM GOAL #1   Title I with HEP   Time 4   Period Weeks   Status Achieved   PT LONG TERM GOAL #2   Title Increase Lt ankle dorsiflexion =/> 15 degrees to assist gait   Time 4   Period Weeks   Status Achieved   PT LONG TERM GOAL #3   Title increase strength Lt ankle =/> 5-/5 to support leg with standing    Time 4   Period Weeks   Status Partially Met   PT LONG TERM GOAL #4   Title improve FOTO =/< CJ level   Time 4   Period Weeks   Status On-going   PT LONG TERM GOAL #5   Title tolerate being out of CAM boot without increased Lt ankle pain   Time 4   Period Weeks   Status On-going               Plan - 02/06/15 1034    Clinical Impression Statement Pt tolerated treatment well today. Pt performed higher level balance activities with minimal difficulty; some decreased balance noted with tandem and brading.  Pt has demonstrated increased Lt ankle ROM and strength since beginning PT.  Pt has met LTG #1 and 2.    Pt will benefit from skilled therapeutic intervention in order to improve on the following deficits Decreased strength;Pain;Obesity;Difficulty walking;Decreased range of motion   Rehab Potential Excellent   PT Frequency 2x / week   PT Duration 4 weeks   PT Treatment/Interventions Moist Heat;Patient/family education;Therapeutic exercise;Passive range of motion;Ultrasound;Balance training;Manual techniques;Cryotherapy;Electrical Stimulation   PT Next Visit Plan Continue progressive strengthening for Lt ankle. Advance HEP.    Consulted and Agree with Plan of Care Patient        Problem List Patient Active Problem List   Diagnosis Date Noted  . Left foot pain 01/10/2015  . Severe obesity (BMI >= 40) 08/28/2014  . Cataract 08/12/2014  . Unspecified hypothyroidism 05/28/2014  . Lumbar spondylosis 02/26/2014  . Adult hypothyroidism 02/01/2014  . HLD (hyperlipidemia) 02/01/2014  . Essential hypertension 09/20/2011  . Osteoarthritis of both knees 09/20/2011  . MORBID OBESITY 04/22/2010  . POSTMENOPAUSAL STATUS 04/22/2010  . TROCHANTERIC BURSITIS, RIGHT 12/03/2009  . Goiter 04/01/2009  . Diabetes mellitus 04/01/2009    Kerin Perna, PTA 02/06/2015 10:48 AM  [facility] [place],  [place], [address] Phone: [phone]   Fax:  [phone]

## 2015-02-10 ENCOUNTER — Encounter: Payer: Self-pay | Admitting: Sports Medicine

## 2015-02-10 ENCOUNTER — Ambulatory Visit (INDEPENDENT_AMBULATORY_CARE_PROVIDER_SITE_OTHER): Payer: Medicare Other | Admitting: Sports Medicine

## 2015-02-10 VITALS — BP 146/82 | HR 77 | Wt 279.0 lb

## 2015-02-10 DIAGNOSIS — M79672 Pain in left foot: Secondary | ICD-10-CM | POA: Diagnosis not present

## 2015-02-10 NOTE — Assessment & Plan Note (Signed)
Persistent flatfoot deformity, with persistent pain despite formal physical therapy albeit much better. 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 Continue physical therapy.  Return in a month, MRI for if no better.

## 2015-02-10 NOTE — Progress Notes (Signed)
  Subjective:    CC: Follow-up  HPI: Left ankle pain: Clinically diagnosed as tibialis posterior tendinitis, better with boot immobilization, scaphoid pads, and formal physical therapy. Has not yet plateaued but is amenable to try interventional treatment today. Symptoms are moderate, persistent and localized behind the medial malleolus.  Past medical history, Surgical history, Family history not pertinant except as noted below, Social history, Allergies, and medications have been entered into the medical record, reviewed, and no changes needed.   Review of Systems: No fevers, chills, night sweats, weight loss, chest pain, or shortness of breath.   Objective:    General: Well Developed, well nourished, and in no acute distress.  Neuro: Alert and oriented x3, extra-ocular muscles intact, sensation grossly intact.  HEENT: Normocephalic, atraumatic, pupils equal round reactive to light, neck supple, no masses, no lymphadenopathy, thyroid nonpalpable.  Skin: Warm and dry, no rashes. Cardiac: Regular rate and rhythm, no murmurs rubs or gallops, no lower extremity edema.  Respiratory: Clear to auscultation bilaterally. Not using accessory muscles, speaking in full sentences.  Procedure: Real-time Ultrasound Guided Injection of left tibialis posterior tendon sheath Device: GE Logiq E  Verbal informed consent obtained.  Time-out conducted.  Noted no overlying erythema, induration, or other signs of local infection.  Skin prepped in a sterile fashion.  Local anesthesia: Topical Ethyl chloride.  With sterile technique and under real time ultrasound guidance:  Using ultrasound guidance and taking care to avoid intratendinous injection total of 1 mL Kenalog 40, 4 mL lidocaine was injected easily. Completed without difficulty  Pain immediately resolved suggesting accurate placement of the medication.  Advised to call if fevers/chills, erythema, induration, drainage, or persistent bleeding.  Images  permanently stored and available for review in the ultrasound unit.  Impression: Technically successful ultrasound guided injection.  Impression and Recommendations:

## 2015-02-11 ENCOUNTER — Ambulatory Visit (INDEPENDENT_AMBULATORY_CARE_PROVIDER_SITE_OTHER): Payer: Medicare Other | Admitting: Physical Therapy

## 2015-02-11 DIAGNOSIS — M259 Joint disorder, unspecified: Secondary | ICD-10-CM | POA: Diagnosis not present

## 2015-02-11 DIAGNOSIS — R29898 Other symptoms and signs involving the musculoskeletal system: Secondary | ICD-10-CM

## 2015-02-11 DIAGNOSIS — M25572 Pain in left ankle and joints of left foot: Secondary | ICD-10-CM | POA: Diagnosis not present

## 2015-02-11 NOTE — Therapy (Signed)
[place], [place], [address] Phone: [phone]   Fax:  [phone]  Physical Therapy Treatment  Patient Details  Name: Regina Houston MRN: [id number] Date of Birth: 1951/11/06 Referring Provider:  Silverio Decamp,*  Encounter Date: 02/11/2015      PT End of Session - 02/11/15 1101    Visit Number 6   Number of Visits 8   Date for PT Re-Evaluation 02/19/15   PT Start Time 1101   PT Stop Time 1147   PT Time Calculation (min) 46 min      Past Medical History  Diagnosis Date  . Diverticulitis   . Diabetes mellitus 2007  . Hyperlipidemia   . Obesity     Past Surgical History  Procedure Laterality Date  . Abdominal hysterectomy  2008    fibroids  . 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 Foot surgery  2004    hammer toe  . Biopsy breast  2007    benign calcification    There were no vitals filed for this visit.  Visit Diagnosis:  Ankle weakness  Pain in joint, ankle and foot, left      Subjective Assessment - 02/11/15 1104    Symptoms soreness in left foot   Pertinent History Saw MD yesterday. Boot for 2 more weeks. Received coriticosterioid injection and it aches a little at the injection site.    Currently in Pain? Yes   Pain Score 1    Pain Location Ankle   Pain Orientation Left   Pain Descriptors / Indicators Aching   Pain Onset 1 to 4 weeks ago   Pain Frequency Intermittent   Aggravating Factors  nothing in particular. Working more and that sometimes irritates.   Pain Relieving Factors rest, boot, ice   Multiple Pain Sites No            [facility] PT Assessment - 02/11/15 0001    Strength   Left Ankle Eversion 5/5                   [facility] Adult PT Treatment/Exercise - 02/11/15 0001    High Level Balance   High Level Balance Activities Braiding;Tandem walking   High Level Balance Comments Pt caught her foot multiple times bil with  braiding when perfroming hip adduction   Manual Therapy   Manual Therapy Myofascial release  to left plantar fascia   Massage ice massage to left plantar fascia   Ankle Exercises: Aerobic   Stationary Bike NuStep L6 x 6 min   Ankle Exercises: Stretches   Soleus Stretch 2 reps;30 seconds   Gastroc Stretch 2 reps;30 seconds   Ankle Exercises: Standing   SLS 6 trials   longest 5 sec   Heel Raises 20 reps  10 reps toes in and toes out  x 2 sets ea   Toe Raise 20 reps   Other Standing Ankle Exercises Lt lateral step ups x 15   Other Standing Ankle Exercises fwd step ups x 15                PT Education - 02/11/15 1208    Education provided Yes   Education Details advised pt to continue advanced balance activities in hallway and SLS.   Person(s) Educated Patient   Methods Explanation   Comprehension Verbalized understanding;Returned demonstration             PT Long Term Goals - 02/11/15  Axis #3   Title increase strength Lt ankle =/> 5-/5 to support leg with standing   Time 4   Period Weeks   Status Achieved   PT LONG TERM GOAL #4   Title improve FOTO =/< CJ level   Time 4   Period Weeks   Status On-going   PT LONG TERM GOAL #5   Title tolerate being out of CAM boot without increased Lt ankle pain   Baseline --  Patient to stay in boot for 2 more weeks   Time 4   Period Weeks   Status On-going               Plan - 02/11/15 1210    Clinical Impression Statement Pt had significant difficulty with SLS today and continued balance deficits with brading and tandem walking. Demo's 5/5 eversion strength meeting LTG #3.   Pt will benefit from skilled therapeutic intervention in order to improve on the following deficits Decreased strength;Pain;Obesity;Difficulty walking;Decreased range of motion   Rehab Potential Excellent   PT Frequency 2x / week   PT Duration 4 weeks   PT Treatment/Interventions Moist Heat;Patient/family  education;Therapeutic exercise;Passive range of motion;Ultrasound;Balance training;Manual techniques;Cryotherapy;Electrical Stimulation   PT Next Visit Plan contnue advanced balance activites and LE strengthening.   Consulted and Agree with Plan of Care Patient        Problem List Patient Active Problem List   Diagnosis Date Noted  . Left foot pain 01/10/2015  . Severe obesity (BMI >= 40) 08/28/2014  . Cataract 08/12/2014  . Unspecified hypothyroidism 05/28/2014  . Lumbar spondylosis 02/26/2014  . Adult hypothyroidism 02/01/2014  . HLD (hyperlipidemia) 02/01/2014  . Essential hypertension 09/20/2011  . Osteoarthritis of both knees 09/20/2011  . MORBID OBESITY 04/22/2010  . POSTMENOPAUSAL STATUS 04/22/2010  . TROCHANTERIC BURSITIS, RIGHT 12/03/2009  . Goiter 04/01/2009  . Diabetes mellitus 04/01/2009    Madelyn Flavors PT 02/11/2015, 12:22 PM  [facility] [address] [place], [place], [address] Phone: [phone]   Fax:  [phone]

## 2015-02-13 ENCOUNTER — Ambulatory Visit (INDEPENDENT_AMBULATORY_CARE_PROVIDER_SITE_OTHER): Payer: Medicare Other | Admitting: Physical Therapy

## 2015-02-13 DIAGNOSIS — M25672 Stiffness of left ankle, not elsewhere classified: Secondary | ICD-10-CM

## 2015-02-13 DIAGNOSIS — M25572 Pain in left ankle and joints of left foot: Secondary | ICD-10-CM

## 2015-02-13 DIAGNOSIS — R29898 Other symptoms and signs involving the musculoskeletal system: Secondary | ICD-10-CM

## 2015-02-13 DIAGNOSIS — M259 Joint disorder, unspecified: Secondary | ICD-10-CM | POA: Diagnosis not present

## 2015-02-13 NOTE — Therapy (Signed)
[facility] [place], [place], [address] Phone: [phone]   Fax:  [phone]  Physical Therapy Treatment  Patient Details  Name: Regina Houston MRN: [id number] Date of Birth: 1950-12-05 Referring Provider:  Hali Marry, *  Encounter Date: 02/13/2015      PT End of Session - 02/13/15 1451    Visit Number 7   Number of Visits 8   Date for PT Re-Evaluation 02/19/15   PT Start Time 3810   PT Stop Time 1533   PT Time Calculation (min) 44 min   Activity Tolerance Patient tolerated treatment well      Past Medical History  Diagnosis Date  . Diverticulitis   . Diabetes mellitus 2007  . Hyperlipidemia   . Obesity     Past Surgical History  Procedure Laterality Date  . Abdominal hysterectomy  2008    fibroids  . 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 Foot surgery  2004    hammer toe  . Biopsy breast  2007    benign calcification    There were no vitals filed for this visit.  Visit Diagnosis:  Ankle weakness  Stiffness of ankle joint, left  Pain in joint, ankle and foot, left      Subjective Assessment - 02/13/15 1456    Symptoms "I think that injection made all the difference"    Currently in Pain? No/denies   Pain Score 0-No pain            [facility] PT Assessment - 02/13/15 0001    Assessment   Medical Diagnosis Lt foot pain   Onset Date 01/08/15   Restrictions   Other Position/Activity Restrictions CAM boot with walking until April 4th.                    [facility] Adult PT Treatment/Exercise - 02/13/15 0001    Ambulation/Gait   Ambulation/Gait Yes   Ambulation Distance (Feet) 150 Feet   Assistive device None   Gait Pattern Decreased step length - right;Step-through pattern  decreased clearance bilat (scuffs heel)    Gait Comments VC to increase Rt step length to equal Lt; improved with cues and repetition   Exercises   Exercises Ankle;Knee/Hip    Manual Therapy   Manual Therapy Myofascial release  to left plantar fascia   Massage ice massage to left plantar fascia   Ankle Exercises: Seated   Other Seated Ankle Exercises Inversion with plantar flexion x 10 reps x 2 sets with blue band; eversion with blue band x 10 reps x 2 sets   Ankle Exercises: Standing   SLS 5 trials each leg.    Heel Raises 20 reps  10 reps toes in and toes out  x 2 sets ea   Other Standing Ankle Exercises lateral cross over step ups with LLE x 10 on 3" and x10 on 6", with RLE x 10 on 6"   Ankle Exercises: Aerobic   Stationary Bike NuStep L5 x 6 min   Ankle Exercises: Stretches   Soleus Stretch 2 reps;30 seconds   Gastroc Stretch 2 reps;30 seconds                     PT Long Term Goals - 02/13/15 1554    PT LONG TERM GOAL #1   Title I with HEP   Time 4   Period Weeks   Status Achieved   PT LONG  TERM GOAL #2   Title Increase Lt ankle dorsiflexion =/> 15 degrees to assist gait   Time 4   Period Weeks   Status Achieved   PT LONG TERM GOAL #3   Title increase strength Lt ankle =/> 5-/5 to support leg with standing   Time 4   Period Weeks   Status Achieved   PT LONG TERM GOAL #4   Title improve FOTO =/< CJ level   Time 4   Period Weeks   Status On-going   PT LONG TERM GOAL #5   Title tolerate being out of CAM boot without increased Lt ankle pain   Baseline --  pt to stay in Boot for 2 more wks.    Time 4   Period Weeks   Status On-going               Problem List Patient Active Problem List   Diagnosis Date Noted  . Left foot pain 01/10/2015  . Severe obesity (BMI >= 40) 08/28/2014  . Cataract 08/12/2014  . Unspecified hypothyroidism 05/28/2014  . Lumbar spondylosis 02/26/2014  . Adult hypothyroidism 02/01/2014  . HLD (hyperlipidemia) 02/01/2014  . Essential hypertension 09/20/2011  . Osteoarthritis of both knees 09/20/2011  . MORBID OBESITY 04/22/2010  . POSTMENOPAUSAL STATUS 04/22/2010  . TROCHANTERIC  BURSITIS, RIGHT 12/03/2009  . Goiter 04/01/2009  . Diabetes mellitus 04/01/2009   Kerin Perna, PTA 02/13/2015 3:55 PM   [place], [place], [address] Phone: [phone]   Fax:  [phone]

## 2015-02-18 ENCOUNTER — Encounter: Payer: Self-pay | Admitting: Physical Therapy

## 2015-02-18 ENCOUNTER — Ambulatory Visit (INDEPENDENT_AMBULATORY_CARE_PROVIDER_SITE_OTHER): Payer: Medicare Other | Admitting: Family Medicine

## 2015-02-18 ENCOUNTER — Ambulatory Visit (INDEPENDENT_AMBULATORY_CARE_PROVIDER_SITE_OTHER): Payer: Medicare Other | Admitting: Physical Therapy

## 2015-02-18 VITALS — BP 118/77 | HR 76 | Ht 65.0 in | Wt 271.0 lb

## 2015-02-18 DIAGNOSIS — R29898 Other symptoms and signs involving the musculoskeletal system: Secondary | ICD-10-CM

## 2015-02-18 DIAGNOSIS — M25572 Pain in left ankle and joints of left foot: Secondary | ICD-10-CM

## 2015-02-18 DIAGNOSIS — M259 Joint disorder, unspecified: Secondary | ICD-10-CM

## 2015-02-18 DIAGNOSIS — M25672 Stiffness of left ankle, not elsewhere classified: Secondary | ICD-10-CM

## 2015-02-18 DIAGNOSIS — R635 Abnormal weight gain: Secondary | ICD-10-CM | POA: Diagnosis not present

## 2015-02-18 MED ORDER — PHENTERMINE HCL 37.5 MG PO CAPS: 37.5000 mg | ORAL_CAPSULE | ORAL | Status: DC

## 2015-02-18 NOTE — Progress Notes (Signed)
   Subjective:    Patient ID: Regina Houston, female    DOB: 11-17-1951, 64 y.o.   MRN: [id number]  HPI Patient is here for blood pressure and weight check. Denies any trouble sleeping, palpitations, or any other medication problems. Pt states she was walking until she had a foot injury and had to cut back. Her foot sprain is beginning to feel better and Pt states she will get back to her exercise routine once she finishes her physical therapy.   Review of Systems     Objective:   Physical Exam        Assessment & Plan:  Patient has lost weight. A refill for Phentermine will be sent to patient preferred pharmacy. Patient advised to schedule a four week nurse visit and keep her upcoming appointment with her PCP. Verbalized understanding, no further questions.   Down 8 lbs which is great! [name], MD

## 2015-02-18 NOTE — Therapy (Signed)
[facility], [place], [address] Phone: [phone]   Fax:  [phone]  Physical Therapy Treatment  Patient Details  Name: Regina Houston MRN: [id number] Date of Birth: [birth year] Referring Provider:  Silverio Decamp,*  Encounter Date: 02/18/2015      PT End of Session - 02/18/15 1006    Visit Number 8   Number of Visits 8   Date for PT Re-Evaluation 02/19/15   PT Start Time 0930   PT Stop Time 1007   PT Time Calculation (min) 37 min   Activity Tolerance Patient tolerated treatment well   Behavior During Therapy [facility] for tasks assessed/performed      Past Medical History  Diagnosis Date  . Diverticulitis   . Diabetes mellitus 2007  . Hyperlipidemia   . Obesity     Past Surgical History  Procedure Laterality Date  . Abdominal hysterectomy  2008    fibroids  . 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 Foot surgery  2004    hammer toe  . Biopsy breast  2007    benign calcification    There were no vitals filed for this visit.  Visit Diagnosis:  Ankle weakness  Stiffness of ankle joint, left  Pain in joint, ankle and foot, left      Subjective Assessment - 02/18/15 0931    Symptoms pt worked yesterday, pain in back today but not in Lt foot   Currently in Pain? No/denies            [facility] PT Assessment - 02/18/15 0001    Observation/Other Assessments   Focus on Therapeutic Outcomes (FOTO)  42%                   [facility] Adult PT Treatment/Exercise - 02/18/15 0001    Ambulation/Gait   Ambulation Distance (Feet) 300 Feet   Assistive device None   Gait Pattern --  improved step length and heel/toe pattern   Knee/Hip Exercises: Aerobic   Stationary Bike nustsep level 6 x 5 minutes   Manual Therapy   Manual Therapy Myofascial release  Lt plantar fascia   Massage ice massage   Ankle Exercises: Stretches   Soleus Stretch 2 reps;30 seconds   Gastroc Stretch 2 reps;30 seconds   Ankle Exercises: Standing   SLS 5 trials each leg   Heel Raises --  10x each toe in, out, straight   Other Standing Ankle Exercises lt lateral step ups x 15   Other Standing Ankle Exercises fwd step ups x20                     PT Long Term Goals - 02/18/15 1007    PT LONG TERM GOAL #1   Title I with HEP   Status Achieved   PT LONG TERM GOAL #2   Title Increase Lt ankle dorsiflexion =/> 15 degrees to assist gait   Status Achieved   PT LONG TERM GOAL #3   Title increase strength Lt ankle =/> 5-/5 to support leg with standing   Status Achieved   PT LONG TERM GOAL #4   Title improve FOTO =/< CJ level   Status Not Met               Plan - 02/18/15 1007    Clinical Impression Statement pt without pain in Lt foot today, improved gait pattern. Pt has met PT goals and feels ready  to d/c PT        Problem List Patient Active Problem List   Diagnosis Date Noted  . Left foot pain 01/10/2015  . Severe obesity (BMI >= 40) 08/28/2014  . Cataract 08/12/2014  . Unspecified hypothyroidism 05/28/2014  . Lumbar spondylosis 02/26/2014  . Adult hypothyroidism 02/01/2014  . HLD (hyperlipidemia) 02/01/2014  . Essential hypertension 09/20/2011  . Osteoarthritis of both knees 09/20/2011  . MORBID OBESITY 04/22/2010  . POSTMENOPAUSAL STATUS 04/22/2010  . TROCHANTERIC BURSITIS, RIGHT 12/03/2009  . Goiter 04/01/2009  . Diabetes mellitus 04/01/2009    Isabelle Course, PT, DPT  02/18/2015, 10:10 AM  [facility] [place], [place], [address] Phone: [phone]   Fax:  [phone]   PHYSICAL THERAPY DISCHARGE SUMMARY  Visits from Start of Care: 8  Current functional level related to goals / functional outcomes: Pt with improved gait, strength and ROM and decreased pain.   Remaining deficits: Decreased balance   Education / Equipment: HEP  Plan: Patient  agrees to discharge.  Patient goals were partially met. Patient is being discharged due to meeting the stated rehab goals.  ?????

## 2015-02-20 ENCOUNTER — Encounter: Payer: No Typology Code available for payment source | Admitting: Physical Therapy

## 2015-03-03 ENCOUNTER — Other Ambulatory Visit: Payer: Self-pay | Admitting: Sports Medicine

## 2015-03-13 ENCOUNTER — Ambulatory Visit: Payer: No Typology Code available for payment source | Admitting: Sports Medicine

## 2015-03-13 ENCOUNTER — Encounter: Payer: Self-pay | Admitting: Sports Medicine

## 2015-03-13 ENCOUNTER — Ambulatory Visit (INDEPENDENT_AMBULATORY_CARE_PROVIDER_SITE_OTHER): Payer: Medicare Other | Admitting: Sports Medicine

## 2015-03-13 VITALS — BP 139/81 | HR 82 | Ht 65.0 in | Wt 275.0 lb

## 2015-03-13 DIAGNOSIS — M47896 Other spondylosis, lumbar region: Secondary | ICD-10-CM | POA: Diagnosis not present

## 2015-03-13 DIAGNOSIS — M79672 Pain in left foot: Secondary | ICD-10-CM | POA: Diagnosis not present

## 2015-03-13 MED ORDER — PREGABALIN 50 MG PO CAPS: ORAL_CAPSULE | ORAL | Status: DC

## 2015-03-13 NOTE — Progress Notes (Signed)
  Subjective:    CC: follow-up  HPI: Left foot and ankle pain: Doing significantly better after tibialis posterior tendon sheath injection, and scaphoid pads placed into shoes.  Bilateral knee osteoarthritis: Doing well.  Low back pain: Starting to have a recurrence, she did have a right-sided L5-S1 facet radiofrequency ablation and a good response to a left-sided L5-S1 facet injection.  Past medical history, Surgical history, Family history not pertinant except as noted below, Social history, Allergies, and medications have been entered into the medical record, reviewed, and no changes needed.   Review of Systems: No fevers, chills, night sweats, weight loss, chest pain, or shortness of breath.   Objective:    General: Well Developed, well nourished, and in no acute distress.  Neuro: Alert and oriented x3, extra-ocular muscles intact, sensation grossly intact.  HEENT: Normocephalic, atraumatic, pupils equal round reactive to light, neck supple, no masses, no lymphadenopathy, thyroid nonpalpable.  Skin: Warm and dry, no rashes. Cardiac: Regular rate and rhythm, no murmurs rubs or gallops, no lower extremity edema.  Respiratory: Clear to auscultation bilaterally. Not using accessory muscles, speaking in full sentences.  New set of scaphoid pads placed into her shoes.  Impression and Recommendations:

## 2015-03-13 NOTE — Assessment & Plan Note (Signed)
Scaphoid pads placed. Much better after injection physical therapy. Return as needed.

## 2015-03-21 ENCOUNTER — Ambulatory Visit: Payer: No Typology Code available for payment source

## 2015-03-25 ENCOUNTER — Other Ambulatory Visit: Payer: Self-pay | Admitting: Sports Medicine

## 2015-04-03 ENCOUNTER — Ambulatory Visit (INDEPENDENT_AMBULATORY_CARE_PROVIDER_SITE_OTHER): Payer: Medicare Other | Admitting: Family Medicine

## 2015-04-03 ENCOUNTER — Encounter: Payer: Self-pay | Admitting: Family Medicine

## 2015-04-03 VITALS — BP 114/69 | HR 74 | Ht 65.0 in | Wt 277.0 lb

## 2015-04-03 DIAGNOSIS — I10 Essential (primary) hypertension: Secondary | ICD-10-CM

## 2015-04-03 DIAGNOSIS — E039 Hypothyroidism, unspecified: Secondary | ICD-10-CM

## 2015-04-03 DIAGNOSIS — Z0000 Encounter for general adult medical examination without abnormal findings: Secondary | ICD-10-CM | POA: Diagnosis not present

## 2015-04-03 DIAGNOSIS — R635 Abnormal weight gain: Secondary | ICD-10-CM

## 2015-04-03 DIAGNOSIS — E119 Type 2 diabetes mellitus without complications: Secondary | ICD-10-CM | POA: Diagnosis not present

## 2015-04-03 DIAGNOSIS — Z1159 Encounter for screening for other viral diseases: Secondary | ICD-10-CM

## 2015-04-03 DIAGNOSIS — E049 Nontoxic goiter, unspecified: Secondary | ICD-10-CM

## 2015-04-03 DIAGNOSIS — Z23 Encounter for immunization: Secondary | ICD-10-CM

## 2015-04-03 DIAGNOSIS — E785 Hyperlipidemia, unspecified: Secondary | ICD-10-CM

## 2015-04-03 LAB — COMPLETE METABOLIC PANEL WITH GFR
ALT: 46 U/L — ABNORMAL HIGH (ref 0–35)
AST: 28 U/L (ref 0–37)
Albumin: 4 g/dL (ref 3.5–5.2)
Alkaline Phosphatase: 93 U/L (ref 39–117)
BUN: 30 mg/dL — ABNORMAL HIGH (ref 6–23)
CO2: 27 mEq/L (ref 19–32)
Calcium: 9.6 mg/dL (ref 8.4–10.5)
Chloride: 104 mEq/L (ref 96–112)
Creat: 0.98 mg/dL (ref 0.50–1.10)
GFR, Est African American: 70 mL/min
GFR, Est Non African American: 60 mL/min
Glucose, Bld: 109 mg/dL — ABNORMAL HIGH (ref 70–99)
Potassium: 4.9 mEq/L (ref 3.5–5.3)
Sodium: 138 mEq/L (ref 135–145)
Total Bilirubin: 0.4 mg/dL (ref 0.2–1.2)
Total Protein: 6.3 g/dL (ref 6.0–8.3)

## 2015-04-03 LAB — CBC WITH DIFFERENTIAL/PLATELET
Basophils Absolute: 0 10*3/uL (ref 0.0–0.1)
Basophils Relative: 0 % (ref 0–1)
Eosinophils Absolute: 0.3 10*3/uL (ref 0.0–0.7)
Eosinophils Relative: 4 % (ref 0–5)
HCT: 40.3 % (ref 36.0–46.0)
Hemoglobin: 13.5 g/dL (ref 12.0–15.0)
Lymphocytes Relative: 28 % (ref 12–46)
Lymphs Abs: 1.8 10*3/uL (ref 0.7–4.0)
MCH: 29.9 pg (ref 26.0–34.0)
MCHC: 33.5 g/dL (ref 30.0–36.0)
MCV: 89.4 fL (ref 78.0–100.0)
MPV: 9.5 fL (ref 8.6–12.4)
Monocytes Absolute: 0.4 10*3/uL (ref 0.1–1.0)
Monocytes Relative: 6 % (ref 3–12)
Neutro Abs: 4 10*3/uL (ref 1.7–7.7)
Neutrophils Relative %: 62 % (ref 43–77)
Platelets: 345 10*3/uL (ref 150–400)
RBC: 4.51 MIL/uL (ref 3.87–5.11)
RDW: 13.7 % (ref 11.5–15.5)
WBC: 6.5 10*3/uL (ref 4.0–10.5)

## 2015-04-03 LAB — LIPID PANEL
Cholesterol: 161 mg/dL (ref 0–200)
HDL: 63 mg/dL (ref >=46)
LDL Cholesterol: 83 mg/dL (ref 0–99)
Total CHOL/HDL Ratio: 2.6 Ratio
Triglycerides: 73 mg/dL (ref <150)
VLDL: 15 mg/dL (ref 0–40)

## 2015-04-03 LAB — POCT GLYCOSYLATED HEMOGLOBIN (HGB A1C): Hemoglobin A1C: 6.1

## 2015-04-03 LAB — TSH: TSH: 1.4 u[IU]/mL (ref 0.350–4.500)

## 2015-04-03 MED ORDER — LISINOPRIL 20 MG PO TABS: 20.0000 mg | ORAL_TABLET | Freq: Every day | ORAL | Status: DC

## 2015-04-03 MED ORDER — PHENTERMINE HCL 37.5 MG PO CAPS: 37.5000 mg | ORAL_CAPSULE | ORAL | Status: DC

## 2015-04-03 MED ORDER — IMIQUIMOD 5 % EX CREA: TOPICAL_CREAM | CUTANEOUS | Status: DC

## 2015-04-03 MED ORDER — FUROSEMIDE 20 MG PO TABS: 20.0000 mg | ORAL_TABLET | Freq: Every day | ORAL | Status: DC

## 2015-04-03 NOTE — Progress Notes (Signed)
Subjective:    Regina Houston is a 64 y.o. female who presents for Medicare Annual/Subsequent preventive examination.  Preventive Screening-Counseling & Management  Tobacco History  Smoking status  . Never Smoker   Smokeless tobacco  . Not on file     Problems Prior to Visit 1. Has been swelling more in her LE. Says had mentioned to another doc who had suggested lasix.  He swelling is better in the AM and gets worse at the day goes on.    2. Diabetes - no hypoglycemic events. No wounds or sores that are not healing well. No increased thirst or urination. Checking glucose at home. Taking medications as prescribed without any side effects.   Current Problems (verified) Patient Active Problem List   Diagnosis Date Noted  . Left foot pain 01/10/2015  . Severe obesity (BMI >= 40) 08/28/2014  . Cataract 08/12/2014  . Unspecified hypothyroidism 05/28/2014  . Lumbar spondylosis 02/26/2014  . Adult hypothyroidism 02/01/2014  . HLD (hyperlipidemia) 02/01/2014  . Essential hypertension 09/20/2011  . Osteoarthritis of both knees 09/20/2011  . MORBID OBESITY 04/22/2010  . POSTMENOPAUSAL STATUS 04/22/2010  . TROCHANTERIC BURSITIS, RIGHT 12/03/2009  . Goiter 04/01/2009  . Diabetes mellitus 04/01/2009    Medications Prior to Visit Current Outpatient Prescriptions on File Prior to Visit  Medication Sig Dispense Refill  . AMBULATORY NON FORMULARY MEDICATION Medication Name: Glucometer.  STrips and lancets to test daily.   Dx 250.00. 1 Units prn  . AMBULATORY NON FORMULARY MEDICATION Knee-high, medium compression, graduated compression stockings. Apply to lower extremities. 1 each 0  . aspirin 81 MG tablet Take 81 mg by mouth daily.      . calcium carbonate (OS-CAL) 600 MG TABS Take 600 mg by mouth 2 (two) times daily with a meal.      . celecoxib (CELEBREX) 200 MG capsule TAKE ONE TO TWO CAPSULES BY MOUTH ONCE DAILY AS NEEDED FOR PAIN 60 capsule 5  . diclofenac (VOLTAREN) 75 MG EC  tablet TAKE ONE TABLET BY MOUTH TWICE DAILY 60 tablet 0  . fish oil-omega-3 fatty acids 1000 MG capsule Take 2 g by mouth daily.      Marland Kitchen levothyroxine (SYNTHROID, LEVOTHROID) 100 MCG tablet TAKE ONE TABLET BY MOUTH ONCE DAILY 90 tablet 0  . lisinopril-hydrochlorothiazide (PRINZIDE,ZESTORETIC) 20-25 MG per tablet TAKE ONE TABLET BY MOUTH ONCE DAILY 90 tablet 0  . metFORMIN (GLUCOPHAGE) 500 MG tablet TAKE ONE TABLET BY MOUTH TWICE DAILY WITH MEALS 180 tablet 0  . Multiple Vitamin (THERA) TABS Take 1 tablet by mouth.    . phentermine 37.5 MG capsule Take 1 capsule (37.5 mg total) by mouth every morning. 30 capsule 0  . pregabalin (LYRICA) 50 MG capsule One cap 2-3 times per day 63 capsule 0  . simvastatin (ZOCOR) 20 MG tablet TAKE ONE TABLET BY MOUTH AT BEDTIME 90 tablet 0  . traMADol (ULTRAM) 50 MG tablet TAKE ONE TO TWO TABLETS BY MOUTH EVERY 8 HOURS.  MAX OF 6 TABLETS PER DAY 90 tablet 0  . Urea 40 % LOTN Apply 1 application topically 2 (two) times daily as needed. 325 mL 11   No current facility-administered medications on file prior to visit.    Current Medications (verified) Current Outpatient Prescriptions  Medication Sig Dispense Refill  . AMBULATORY NON FORMULARY MEDICATION Medication Name: Glucometer.  STrips and lancets to test daily.   Dx 250.00. 1 Units prn  . AMBULATORY NON FORMULARY MEDICATION Knee-high, medium compression, graduated compression stockings. Apply to lower  extremities. 1 each 0  . aspirin 81 MG tablet Take 81 mg by mouth daily.      . calcium carbonate (OS-CAL) 600 MG TABS Take 600 mg by mouth 2 (two) times daily with a meal.      . celecoxib (CELEBREX) 200 MG capsule TAKE ONE TO TWO CAPSULES BY MOUTH ONCE DAILY AS NEEDED FOR PAIN 60 capsule 5  . diclofenac (VOLTAREN) 75 MG EC tablet TAKE ONE TABLET BY MOUTH TWICE DAILY 60 tablet 0  . fish oil-omega-3 fatty acids 1000 MG capsule Take 2 g by mouth daily.      Marland Kitchen levothyroxine (SYNTHROID, LEVOTHROID) 100 MCG tablet  TAKE ONE TABLET BY MOUTH ONCE DAILY 90 tablet 0  . lisinopril-hydrochlorothiazide (PRINZIDE,ZESTORETIC) 20-25 MG per tablet TAKE ONE TABLET BY MOUTH ONCE DAILY 90 tablet 0  . metFORMIN (GLUCOPHAGE) 500 MG tablet TAKE ONE TABLET BY MOUTH TWICE DAILY WITH MEALS 180 tablet 0  . Multiple Vitamin (THERA) TABS Take 1 tablet by mouth.    . phentermine 37.5 MG capsule Take 1 capsule (37.5 mg total) by mouth every morning. 30 capsule 0  . pregabalin (LYRICA) 50 MG capsule One cap 2-3 times per day 63 capsule 0  . simvastatin (ZOCOR) 20 MG tablet TAKE ONE TABLET BY MOUTH AT BEDTIME 90 tablet 0  . traMADol (ULTRAM) 50 MG tablet TAKE ONE TO TWO TABLETS BY MOUTH EVERY 8 HOURS.  MAX OF 6 TABLETS PER DAY 90 tablet 0  . Urea 40 % LOTN Apply 1 application topically 2 (two) times daily as needed. 325 mL 11   No current facility-administered medications for this visit.     Allergies (verified) Review of patient's allergies indicates no known allergies.   PAST HISTORY  Family History Family History  Problem Relation Age of Onset  . Hypertension Mother   . Stroke Mother   . Diabetes Mother   . Kidney disease Father     kidney failure  . Heart disease Maternal Grandmother     MI  . Diabetes Maternal Grandmother   . Heart disease Mother 38  . Heart disease Father 58    Social History History  Substance Use Topics  . Smoking status: Never Smoker   . Smokeless tobacco: Not on file  . Alcohol Use: No     Are there smokers in your home (other than you)? No  Risk Factors Current exercise habits: The patient does not participate in regular exercise at present.  Dietary issues discussed: None   Cardiac risk factors: advanced age (older than 38 for men, 42 for women), diabetes mellitus, hypertension, obesity (BMI >= 30 kg/m2) and sedentary lifestyle.  Depression Screen (Note: if answer to either of the following is "Yes", a more complete depression screening is indicated)   Over the past two  weeks, have you felt down, depressed or hopeless? No  Over the past two weeks, have you felt little interest or pleasure in doing things? No  Have you lost interest or pleasure in daily life? No  Do you often feel hopeless? No  Do you cry easily over simple problems? No  Activities of Daily Living In your present state of health, do you have any difficulty performing the following activities?:  Driving? No Managing money?  No Feeding yourself? No Getting from bed to chair? No  Climbing a flight of stairs? No Preparing food and eating?: No Bathing or showering? No Getting dressed: No Getting to the toilet? No Using the toilet:No Moving around from  place to place: No In the past year have you fallen or had a near fall?:No   Are you sexually active?  No  Do you have more than one partner?  No  Hearing Difficulties: No Do you often ask people to speak up or repeat themselves? No Do you experience ringing or noises in your ears? Yes Do you have difficulty understanding soft or whispered voices? No   Do you feel that you have a problem with memory? No  Do you often misplace items? No  Do you feel safe at home?  Yes  Cognitive Testing  Alert? Yes  Normal Appearance?Yes  Oriented to person? Yes  Place? Yes   Time? Yes  Recall of three objects?  Yes  Can perform simple calculations? Yes  Displays appropriate judgment?Yes  Can read the correct time from a watch face?Yes   6 CIT score of 2 out of 28 , normal   Advanced Directives have been discussed with the patient? Yes  List the Names of Other Physician/Practitioners you currently use: 1.    Indicate any recent Medical Services you may have received from other than [facility] providers in the past year (date may be approximate).  Immunization History  Administered Date(s) Administered  . DTP 11/22/2006  . Influenza Split 10/10/2012  . Influenza Whole 11/22/2006, 10/01/2009, 08/22/2010  . Influenza,inj,Quad PF,36+ Mos  08/28/2014  . Influenza-Unspecified 10/01/2013  . Pneumococcal Conjugate-13 02/19/2014  . Pneumococcal Polysaccharide-23 11/22/2005  . Td 11/22/2006  . Zoster 05/04/2011    Screening Tests Health Maintenance  Topic Date Due  . Hepatitis C Screening  [birth year]  . PNA vac Low Risk Adult (2 of 2 - PPSV23) 02/20/2015  . URINE MICROALBUMIN  05/29/2015  . HEMOGLOBIN A1C  06/12/2015  . INFLUENZA VACCINE  06/23/2015  . OPHTHALMOLOGY EXAM  08/10/2015  . FOOT EXAM  08/29/2015  . MAMMOGRAM  05/28/2016  . COLONOSCOPY  11/22/2016  . TETANUS/TDAP  11/22/2016  . DEXA SCAN  Completed  . ZOSTAVAX  Completed    All answers were reviewed with the patient and necessary referrals were made:  METHENEY,CATHERINE, MD   04/03/2015   History reviewed: allergies, current medications, past family history, past medical history, past social history, past surgical history and problem list  Review of Systems A comprehensive review of systems was negative.    Objective:     Vision by Snellen chart: right eye:20/20, left eye:20/20  Body mass index is 46.1 kg/(m^2). BP 114/69 mmHg  Pulse 74  Ht 5\' 5"  (1.651 m)  Wt 277 lb (125.646 kg)  BMI 46.10 kg/m2  BP 114/69 mmHg  Pulse 74  Ht 5\' 5"  (1.651 m)  Wt 277 lb (125.646 kg)  BMI 46.10 kg/m2 General appearance: alert, cooperative and appears stated age Head: Normocephalic, without obvious abnormality, atraumatic Eyes: conj clear, EOMi, PEERLA  Ears: normal TM's and external ear canals both ears Nose: Nares normal. Septum midline. Mucosa normal. No drainage or sinus tenderness. Throat: lips, mucosa, and tongue normal; teeth and gums normal Neck: no adenopathy, no carotid bruit, no JVD, supple, symmetrical, trachea midline and thyroid not enlarged, symmetric, no tenderness/mass/nodules Back: symmetric, no curvature. ROM normal. No CVA tenderness. Lungs: clear to auscultation bilaterally Breasts: normal appearance, no masses or tenderness Heart:  regular rate and rhythm, S1, S2 normal, no murmur, click, rub or gallop Abdomen: soft, non-tender; bowel sounds normal; no masses,  no organomegaly Extremities: extremities normal, atraumatic, no cyanosis or edema Pulses: 2+ and symmetric Skin: Skin color, texture,  turgor normal. No rashes or lesions Lymph nodes: Cervical, supraclavicular, and axillary nodes normal. Neurologic: Alert and oriented X 3, normal strength and tone. Normal symmetric reflexes. Normal coordination and gait     Assessment:     Medicare Wellness Exam       Plan:     During the course of the visit the patient was educated and counseled about appropriate screening and preventive services including:    Pneumococcal vaccine    Due for labwork  Hypothyroid - doing well with ehr thyroid. No skin or hair changes. Due to check level.    Abnormal weight gain. Says has been eating off schedule bc of work. She hsa been more active.  Using a calorie counter. Set to 1600 calories per day.  Has been eating more cake lately   LE swelling - will stop hctz and switch to lasix.  Will check CR and potassium 3-4 days after start the lasix.  Discussed can "dry the kidneys" .  Consider compression stockings.  May need to add potassium supplement.   Diabetes - well controlled. F/U in 3 months.   Diet review for nutrition referral? Yes ____  Not Indicated __x__   Patient Instructions (the written plan) was given to the patient.  Medicare Attestation I have personally reviewed: The patient's medical and social history Their use of alcohol, tobacco or illicit drugs Their current medications and supplements The patient's functional ability including ADLs,fall risks, home safety risks, cognitive, and hearing and visual impairment Diet and physical activities Evidence for depression or mood disorders  The patient's weight, height, BMI, and visual acuity have been recorded in the chart.  I have made referrals, counseling, and  provided education to the patient based on review of the above and I have provided the patient with a written personalized care plan for preventive services.     METHENEY,CATHERINE, MD   04/03/2015

## 2015-04-03 NOTE — Patient Instructions (Signed)
Keep up a regular exercise program and make sure you are eating a healthy diet Try to eat 4 servings of dairy a day, or if you are lactose intolerant take a calcium with vitamin D daily.  Your vaccines are up to date.   

## 2015-04-04 ENCOUNTER — Other Ambulatory Visit: Payer: Self-pay | Admitting: *Deleted

## 2015-04-04 DIAGNOSIS — R748 Abnormal levels of other serum enzymes: Secondary | ICD-10-CM

## 2015-04-04 LAB — HEPATITIS C ANTIBODY: HCV Ab: NEGATIVE

## 2015-04-08 ENCOUNTER — Encounter: Payer: Self-pay | Admitting: Family Medicine

## 2015-04-09 ENCOUNTER — Other Ambulatory Visit: Payer: Self-pay | Admitting: Sports Medicine

## 2015-04-11 DIAGNOSIS — I10 Essential (primary) hypertension: Secondary | ICD-10-CM | POA: Diagnosis not present

## 2015-04-12 LAB — BASIC METABOLIC PANEL
BUN: 24 mg/dL — ABNORMAL HIGH (ref 6–23)
CO2: 27 mEq/L (ref 19–32)
Calcium: 9.1 mg/dL (ref 8.4–10.5)
Chloride: 106 mEq/L (ref 96–112)
Creat: 1.02 mg/dL (ref 0.50–1.10)
Glucose, Bld: 106 mg/dL — ABNORMAL HIGH (ref 70–99)
Potassium: 5.2 mEq/L (ref 3.5–5.3)
Sodium: 139 mEq/L (ref 135–145)

## 2015-04-22 ENCOUNTER — Other Ambulatory Visit: Payer: Self-pay | Admitting: Family Medicine

## 2015-05-01 ENCOUNTER — Other Ambulatory Visit: Payer: Self-pay | Admitting: Family Medicine

## 2015-05-02 ENCOUNTER — Ambulatory Visit (INDEPENDENT_AMBULATORY_CARE_PROVIDER_SITE_OTHER): Payer: Medicare Other | Admitting: Family Medicine

## 2015-05-02 VITALS — BP 132/69 | HR 70 | Wt 272.0 lb

## 2015-05-02 DIAGNOSIS — Z6841 Body Mass Index (BMI) 40.0 and over, adult: Secondary | ICD-10-CM | POA: Diagnosis not present

## 2015-05-02 DIAGNOSIS — R635 Abnormal weight gain: Secondary | ICD-10-CM

## 2015-05-02 DIAGNOSIS — R748 Abnormal levels of other serum enzymes: Secondary | ICD-10-CM | POA: Diagnosis not present

## 2015-05-02 MED ORDER — PHENTERMINE HCL 37.5 MG PO TABS: 37.5000 mg | ORAL_TABLET | Freq: Every day | ORAL | Status: DC

## 2015-05-02 NOTE — Progress Notes (Signed)
   Subjective:    Patient ID: Regina Houston, female    DOB: 1951-09-29, 64 y.o.   MRN: [id number]  HPI  Regina Houston is here today for weight and blood pressure check. Denies palpitations or trouble sleeping.   Review of Systems     Objective:   Physical Exam        Assessment & Plan:  Abnormal weight gain - Patient has lost weight and a refill will be sent to [facility] She's lost 5 pounds and blood pressure looks great today. Medication refilled. Follow up in one month for nurse blood pressure weight check.  [name], MD

## 2015-05-03 LAB — HEPATIC FUNCTION PANEL
ALT: 42 U/L — ABNORMAL HIGH (ref 0–35)
AST: 26 U/L (ref 0–37)
Albumin: 4.3 g/dL (ref 3.5–5.2)
Alkaline Phosphatase: 103 U/L (ref 39–117)
Bilirubin, Direct: 0.1 mg/dL (ref 0.0–0.3)
Indirect Bilirubin: 0.4 mg/dL (ref 0.2–1.2)
Total Bilirubin: 0.5 mg/dL (ref 0.2–1.2)
Total Protein: 6.5 g/dL (ref 6.0–8.3)

## 2015-05-05 ENCOUNTER — Other Ambulatory Visit: Payer: Self-pay | Admitting: *Deleted

## 2015-05-05 DIAGNOSIS — R748 Abnormal levels of other serum enzymes: Secondary | ICD-10-CM

## 2015-05-12 ENCOUNTER — Other Ambulatory Visit: Payer: Self-pay | Admitting: Sports Medicine

## 2015-05-20 ENCOUNTER — Other Ambulatory Visit: Payer: Self-pay | Admitting: Family Medicine

## 2015-05-30 ENCOUNTER — Ambulatory Visit (INDEPENDENT_AMBULATORY_CARE_PROVIDER_SITE_OTHER): Payer: Medicare Other | Admitting: Family Medicine

## 2015-05-30 ENCOUNTER — Other Ambulatory Visit: Payer: Self-pay | Admitting: *Deleted

## 2015-05-30 VITALS — BP 124/78 | HR 81 | Wt 270.0 lb

## 2015-05-30 DIAGNOSIS — R635 Abnormal weight gain: Secondary | ICD-10-CM

## 2015-05-30 MED ORDER — AMBULATORY NON FORMULARY MEDICATION: Status: DC

## 2015-05-30 MED ORDER — AMBULATORY NON FORMULARY MEDICATION: Status: AC

## 2015-05-30 MED ORDER — FUROSEMIDE 20 MG PO TABS: 20.0000 mg | ORAL_TABLET | Freq: Every day | ORAL | Status: DC

## 2015-05-30 MED ORDER — PHENTERMINE HCL 37.5 MG PO TABS: 37.5000 mg | ORAL_TABLET | Freq: Every day | ORAL | Status: DC

## 2015-05-30 NOTE — Progress Notes (Signed)
   Subjective:    Patient ID: Regina Houston, female    DOB: [birth year], 64 y.o.   MRN: [id number]  HPI  Patient is here for blood pressure and weight check. Denies any trouble sleeping, palpitations, or any other medication problems.   Review of Systems     Objective:   Physical Exam        Assessment & Plan:  Patient has lost weight. A refill for Phentermine will be sent to patient preferred pharmacy. Patient also states she needs a refill on her diabetic test strips and her lasix. Patient advised to keep her upcoming appointment with her PCP. Verbalized understanding, no further questions.  OK to send refills.  She has only lost 2 pounds and I want to make sure that she keeps the omentum going. Make sure eating healthy and trying to get some regular exercise so that she can continue to work towards her goal. Open one month for blood pressure and weight check.   [name], MD

## 2015-06-09 ENCOUNTER — Other Ambulatory Visit: Payer: Self-pay | Admitting: Sports Medicine

## 2015-06-11 ENCOUNTER — Other Ambulatory Visit: Payer: Self-pay | Admitting: Sports Medicine

## 2015-06-11 DIAGNOSIS — R748 Abnormal levels of other serum enzymes: Secondary | ICD-10-CM | POA: Diagnosis not present

## 2015-06-11 LAB — HEPATIC FUNCTION PANEL
ALT: 32 U/L (ref 0–35)
AST: 24 U/L (ref 0–37)
Albumin: 4.1 g/dL (ref 3.5–5.2)
Alkaline Phosphatase: 101 U/L (ref 39–117)
Bilirubin, Direct: 0.1 mg/dL (ref 0.0–0.3)
Indirect Bilirubin: 0.4 mg/dL (ref 0.2–1.2)
Total Bilirubin: 0.5 mg/dL (ref 0.2–1.2)
Total Protein: 6.7 g/dL (ref 6.0–8.3)

## 2015-07-02 ENCOUNTER — Other Ambulatory Visit: Payer: Self-pay | Admitting: Family Medicine

## 2015-07-02 ENCOUNTER — Encounter: Payer: Self-pay | Admitting: Family Medicine

## 2015-07-02 ENCOUNTER — Ambulatory Visit (INDEPENDENT_AMBULATORY_CARE_PROVIDER_SITE_OTHER): Payer: Medicare Other | Admitting: Family Medicine

## 2015-07-02 VITALS — BP 135/76 | HR 80 | Wt 268.0 lb

## 2015-07-02 DIAGNOSIS — R208 Other disturbances of skin sensation: Secondary | ICD-10-CM

## 2015-07-02 DIAGNOSIS — E119 Type 2 diabetes mellitus without complications: Secondary | ICD-10-CM | POA: Diagnosis not present

## 2015-07-02 DIAGNOSIS — I10 Essential (primary) hypertension: Secondary | ICD-10-CM

## 2015-07-02 DIAGNOSIS — R635 Abnormal weight gain: Secondary | ICD-10-CM | POA: Diagnosis not present

## 2015-07-02 LAB — POCT UA - MICROALBUMIN
Albumin/Creatinine Ratio, Urine, POC: <30
Creatinine, POC: 50 mg/dL
Microalbumin Ur, POC: 10 mg/L

## 2015-07-02 LAB — POCT GLYCOSYLATED HEMOGLOBIN (HGB A1C): Hemoglobin A1C: 6.3

## 2015-07-02 MED ORDER — PHENTERMINE HCL 37.5 MG PO TABS: 37.5000 mg | ORAL_TABLET | Freq: Every day | ORAL | Status: DC

## 2015-07-02 MED ORDER — FUROSEMIDE 20 MG PO TABS: 20.0000 mg | ORAL_TABLET | Freq: Every day | ORAL | Status: DC

## 2015-07-02 NOTE — Progress Notes (Signed)
   Subjective:    Patient ID: Regina Houston, female    DOB: 08-Dec-1950, 64 y.o.   MRN: [id number]  HPI Diabetes - no hypoglycemic events. No wounds or sores that are not healing well. No increased thirst or urination. Checking glucose at home. Taking medications as prescribed without any side effects.  Abnormal weight gain - doing well on phentermine with no S.e. No CP, SOB. No insomnia on the medication.  Has been struggling with exercise.      Hypertension- Pt denies chest pain, SOB, dizziness, or heart palpitations.  Taking meds as directed w/o problems.  Denies medication side effects.    Right hand occ gets cold. No known triggers. Say pain, numbness or tingling.    Review of Systems     Objective:   Physical Exam  Constitutional: She is oriented to person, place, and time. She appears well-developed and well-nourished.  HENT:  Head: Normocephalic and atraumatic.  Cardiovascular: Normal rate, regular rhythm and normal heart sounds.   Pulmonary/Chest: Effort normal and breath sounds normal.  Neurological: She is alert and oriented to person, place, and time.  Skin: Skin is warm and dry.  Psychiatric: She has a normal mood and affect. Her behavior is normal.   Right hand with NORM of fingers and wrist  Normal cap refill.  No swelling or dicoloration.         Assessment & Plan:  DM- well controlled A1c is 6.3. Continue current regimen.   Urine micro performed today. F/U 3 month.    HTN- well controlled. Continue current regimen.  Abnormal weight gain - has lost 2 lbs and is doing well on phentermine. Will refill. Discussed some strategies for diet and exercise. She is really struggling mostly with getting the exercise in. Follow-up in one month for nurse blood pressure and weight check.  Right hand cold - gave reassurance. Most likely benign. Doesn't sound consistent with Raynaud's and doesn't sound like a neurologic issue is the hand is not going numb or tingling or painful.  Just try to keep the hand warm when it happens. If it seems to be getting worse or lasting longer than usual or seems more persistent then please let me know. She has great hair growth on her arm and forearm so I do not suspect peripheral vascular disease.

## 2015-07-04 ENCOUNTER — Ambulatory Visit: Payer: Medicare Other | Admitting: Family Medicine

## 2015-07-16 ENCOUNTER — Other Ambulatory Visit: Payer: Self-pay | Admitting: Sports Medicine

## 2015-07-23 ENCOUNTER — Other Ambulatory Visit: Payer: Self-pay | Admitting: Family Medicine

## 2015-08-06 ENCOUNTER — Ambulatory Visit (INDEPENDENT_AMBULATORY_CARE_PROVIDER_SITE_OTHER): Payer: Medicare Other | Admitting: Family Medicine

## 2015-08-06 VITALS — BP 110/63 | HR 77 | Resp 16 | Wt 265.2 lb

## 2015-08-06 DIAGNOSIS — R635 Abnormal weight gain: Secondary | ICD-10-CM | POA: Diagnosis not present

## 2015-08-06 MED ORDER — PHENTERMINE HCL 37.5 MG PO TABS: 37.5000 mg | ORAL_TABLET | Freq: Every day | ORAL | Status: DC

## 2015-08-06 NOTE — Progress Notes (Signed)
   Subjective:    Patient ID: Regina Houston, female    DOB: 09-09-1951, 64 y.o.   MRN: [id number]  HPIPatient is here for blood pressure and weight check. Denies trouble sleeping, palpitations or medication problems.    Review of Systems     Objective:   Physical Exam        Assessment & Plan:  Patient has lost weight. A refill for phentermine will be faxed to [facility] on [address]. Patient advised to schedule a follow up with nurse in 30 days; appt with Dr.Metheney 10/02/15.  Abnormal weight gain-she is down 3 pounds which is fantastic. Continue current regimen follow-up in one month.  [name], MD

## 2015-08-26 ENCOUNTER — Ambulatory Visit (INDEPENDENT_AMBULATORY_CARE_PROVIDER_SITE_OTHER): Payer: No Typology Code available for payment source | Admitting: Family Medicine

## 2015-08-26 VITALS — BP 119/70 | Wt 266.0 lb

## 2015-08-26 DIAGNOSIS — R635 Abnormal weight gain: Secondary | ICD-10-CM

## 2015-08-26 DIAGNOSIS — Z23 Encounter for immunization: Secondary | ICD-10-CM | POA: Diagnosis not present

## 2015-08-26 NOTE — Progress Notes (Signed)
Patient advised.

## 2015-08-26 NOTE — Progress Notes (Signed)
   Subjective:    Patient ID: Regina Houston, female    DOB: 04/19/1951, 64 y.o.   MRN: [id number]  HPI  Bernette is here for a BP & weight check.  Denies trouble sleeping, heart palpitation, and problems with medication.   Review of Systems     Objective:   Physical Exam        Assessment & Plan:   Leylany gained a weight. On 08/06/15 her weight was 265 lb and today's weight is 266 lb.  Call pt: i will refill one more time but needs to lose at least 2 lbs over the next month. Continue to work on diet and exercise.   [name], MD

## 2015-09-01 ENCOUNTER — Other Ambulatory Visit: Payer: Self-pay | Admitting: *Deleted

## 2015-09-01 DIAGNOSIS — R635 Abnormal weight gain: Secondary | ICD-10-CM

## 2015-09-01 MED ORDER — PHENTERMINE HCL 37.5 MG PO TABS: 37.5000 mg | ORAL_TABLET | Freq: Every day | ORAL | Status: DC

## 2015-09-05 ENCOUNTER — Ambulatory Visit: Payer: Medicare Other

## 2015-09-10 ENCOUNTER — Other Ambulatory Visit: Payer: Self-pay | Admitting: Sports Medicine

## 2015-09-10 ENCOUNTER — Other Ambulatory Visit: Payer: Self-pay | Admitting: Family Medicine

## 2015-09-24 ENCOUNTER — Other Ambulatory Visit: Payer: Self-pay | Admitting: Family Medicine

## 2015-09-24 ENCOUNTER — Telehealth: Payer: Self-pay | Admitting: Sports Medicine

## 2015-09-24 NOTE — Telephone Encounter (Signed)
Received fax for prior authorization on Celecoxib sent through cover my meds waiting on authorization. - CF

## 2015-10-02 ENCOUNTER — Encounter: Payer: Self-pay | Admitting: Family Medicine

## 2015-10-02 ENCOUNTER — Ambulatory Visit (INDEPENDENT_AMBULATORY_CARE_PROVIDER_SITE_OTHER): Payer: Medicare Other | Admitting: Family Medicine

## 2015-10-02 VITALS — BP 113/56 | HR 74 | Ht 65.0 in | Wt 263.0 lb

## 2015-10-02 DIAGNOSIS — E119 Type 2 diabetes mellitus without complications: Secondary | ICD-10-CM

## 2015-10-02 DIAGNOSIS — R635 Abnormal weight gain: Secondary | ICD-10-CM

## 2015-10-02 DIAGNOSIS — I10 Essential (primary) hypertension: Secondary | ICD-10-CM

## 2015-10-02 LAB — COMPLETE METABOLIC PANEL WITH GFR
ALT: 20 U/L (ref 6–29)
AST: 20 U/L (ref 10–35)
Albumin: 4.4 g/dL (ref 3.6–5.1)
Alkaline Phosphatase: 104 U/L (ref 33–130)
BUN: 32 mg/dL — ABNORMAL HIGH (ref 7–25)
CO2: 24 mmol/L (ref 20–31)
Calcium: 9.5 mg/dL (ref 8.6–10.4)
Chloride: 105 mmol/L (ref 98–110)
Creat: 1 mg/dL — ABNORMAL HIGH (ref 0.50–0.99)
GFR, Est African American: 68 mL/min (ref >=60)
GFR, Est Non African American: 59 mL/min — ABNORMAL LOW (ref >=60)
Glucose, Bld: 119 mg/dL — ABNORMAL HIGH (ref 65–99)
Potassium: 4.9 mmol/L (ref 3.5–5.3)
Sodium: 138 mmol/L (ref 135–146)
Total Bilirubin: 0.5 mg/dL (ref 0.2–1.2)
Total Protein: 6.5 g/dL (ref 6.1–8.1)

## 2015-10-02 LAB — POCT GLYCOSYLATED HEMOGLOBIN (HGB A1C): Hemoglobin A1C: 6.1

## 2015-10-02 MED ORDER — PHENTERMINE HCL 37.5 MG PO TABS: 37.5000 mg | ORAL_TABLET | Freq: Every day | ORAL | Status: DC

## 2015-10-02 NOTE — Patient Instructions (Signed)
Try decreasing lisinopril half a tab daily.  Monitor at home and call if BP goes back up.

## 2015-10-02 NOTE — Progress Notes (Signed)
   Subjective:    Patient ID: Regina Houston, female    DOB: 1951/10/26, 64 y.o.   MRN: [id number]  HPI Diabetes - no hypoglycemic events. No wounds or sores that are not healing well. No increased thirst or urination. Checking glucose at home. Taking medications as prescribed without any side effects. She has a scheduled appointment for her eye exam coming up seeing.  Hypertension- Pt denies chest pain, SOB, dizziness, or heart palpitations.  Taking meds as directed w/o problems.  Denies medication side effects.    Abnormal weight gain. She has lost 3 more lbs.  No CP or palpitations. No insomnia. She has not been able to walk as much because of back pain but plans on getting back in with her sports medicine physician.   Review of Systems     Objective:   Physical Exam  Constitutional: She is oriented to person, place, and time. She appears well-developed and well-nourished.  HENT:  Head: Normocephalic and atraumatic.  Cardiovascular: Normal rate, regular rhythm and normal heart sounds.   Pulmonary/Chest: Effort normal and breath sounds normal.  Neurological: She is alert and oriented to person, place, and time.  Skin: Skin is warm and dry.  Psychiatric: She has a normal mood and affect. Her behavior is normal.          Assessment & Plan:  DM - well controlled. A1C is 6.1  F/U in 3-4 months. She is on an ACE inhibitor and simvastatin.  HTN - well controlled. Continue current regimen. Due for CMP. Lab slip provided today.  Abnormal weight gain - work on increasing back on her exercise. She really is doing fantastic. She hasn't been able to exercise much because of her back but I want her to make sure that she's keeping the momentum going. She's lost about 28 pounds since January which is absolutely phenomenal. Gave her encouragement today to keep up the good work.

## 2015-10-02 NOTE — Telephone Encounter (Signed)
Received authorization. Pharmacy notified.

## 2015-10-06 ENCOUNTER — Ambulatory Visit (INDEPENDENT_AMBULATORY_CARE_PROVIDER_SITE_OTHER): Payer: Medicare Other | Admitting: Sports Medicine

## 2015-10-06 ENCOUNTER — Encounter: Payer: Self-pay | Admitting: Sports Medicine

## 2015-10-06 DIAGNOSIS — M47896 Other spondylosis, lumbar region: Secondary | ICD-10-CM

## 2015-10-06 NOTE — Assessment & Plan Note (Signed)
Good relief to her prior radiofrequency ablation of the right L5-S1 facet in December 2015, pain today sounds predominantly discogenic, and referable to the L4 nerve root, on personal review of the MRI there is a broad-based L4-L5 protrusion that comes in to contact with the extra foraminal L4 nerve root, corresponding to her symptoms. We will start with a selective right sided L4-L5 transforaminal epidural, we can proceed with interlaminar's versus facet ablation if insufficient response.

## 2015-10-06 NOTE — Progress Notes (Signed)
  Subjective:    CC:  Low back pain  HPI: This is a pleasant 64 year old female with a history of widespread lumbar spondylosis, she has multilevel degenerative disc disease as well as facet arthritis, responded well last year to a right L5-S1 facet radio frequency ablation, one year later she is starting to have a recurrence of pain, this time she describes it is worse with sitting, flexion with radiation down the right leg in an exact L4 distribution. No bowel or bladder dysfunction, saddle numbness, or constitutional symptoms, no trauma.  Past medical history, Surgical history, Family history not pertinant except as noted below, Social history, Allergies, and medications have been entered into the medical record, reviewed, and no changes needed.   Review of Systems: No fevers, chills, night sweats, weight loss, chest pain, or shortness of breath.   Objective:    General: Well Developed, well nourished, and in no acute distress.  Neuro: Alert and oriented x3, extra-ocular muscles intact, sensation grossly intact.  HEENT: Normocephalic, atraumatic, pupils equal round reactive to light, neck supple, no masses, no lymphadenopathy, thyroid nonpalpable.  Skin: Warm and dry, no rashes. Cardiac: Regular rate and rhythm, no murmurs rubs or gallops, no lower extremity edema.  Respiratory: Clear to auscultation bilaterally. Not using accessory muscles, speaking in full sentences.  MRI was personally reviewed, there is L3-L4, L4-L5, and L5-S1 degenerative disc disease, the right side of the broad-based L4-L5 disc protrusion does contact the extraforaminal right L4 nerve root.  Impression and Recommendations:

## 2015-10-14 ENCOUNTER — Ambulatory Visit
Admission: RE | Admit: 2015-10-14 | Discharge: 2015-10-14 | Disposition: A | Discharge status: Home / Self Care | Payer: No Typology Code available for payment source | Source: Ambulatory Visit | Attending: Sports Medicine | Admitting: Sports Medicine

## 2015-10-14 VITALS — BP 170/82 | HR 75

## 2015-10-14 DIAGNOSIS — M4726 Other spondylosis with radiculopathy, lumbar region: Secondary | ICD-10-CM

## 2015-10-14 DIAGNOSIS — M47817 Spondylosis without myelopathy or radiculopathy, lumbosacral region: Secondary | ICD-10-CM | POA: Diagnosis not present

## 2015-10-14 MED ORDER — IOHEXOL 180 MG/ML  SOLN
1.0000 mL | Freq: Once | INTRAMUSCULAR | Status: DC | PRN
  Administered 2015-10-14: 1 mL via INTRAVENOUS

## 2015-10-14 MED ORDER — METHYLPREDNISOLONE ACETATE 40 MG/ML INJ SUSP (RADIOLOG
120.0000 mg | Freq: Once | INTRAMUSCULAR | Status: AC
  Administered 2015-10-14: 120 mg via EPIDURAL

## 2015-10-14 NOTE — Discharge Instructions (Signed)

## 2015-10-20 LAB — HM DIABETES EYE EXAM

## 2015-10-23 ENCOUNTER — Other Ambulatory Visit: Payer: Self-pay | Admitting: Family Medicine

## 2015-10-23 ENCOUNTER — Encounter: Payer: Self-pay | Admitting: Family Medicine

## 2015-11-03 ENCOUNTER — Ambulatory Visit: Payer: Medicare Other

## 2015-11-05 ENCOUNTER — Ambulatory Visit (INDEPENDENT_AMBULATORY_CARE_PROVIDER_SITE_OTHER): Payer: Medicare Other | Admitting: Sports Medicine

## 2015-11-05 VITALS — BP 122/74 | HR 85 | Temp 98.1°F | Resp 18 | Wt 264.0 lb

## 2015-11-05 DIAGNOSIS — R635 Abnormal weight gain: Secondary | ICD-10-CM

## 2015-11-05 DIAGNOSIS — M4726 Other spondylosis with radiculopathy, lumbar region: Secondary | ICD-10-CM

## 2015-11-05 MED ORDER — DULAGLUTIDE 1.5 MG/0.5ML ~~LOC~~ SOAJ: 1.5000 mg | SUBCUTANEOUS | Status: DC

## 2015-11-05 MED ORDER — PHENTERMINE HCL 37.5 MG PO TABS
37.5000 mg | ORAL_TABLET | Freq: Every day | ORAL | Status: DC
Start: 2015-11-05 — End: 2015-12-15

## 2015-11-05 NOTE — Assessment & Plan Note (Signed)
Continues to do well with regards to the right-sided facetogenic pain after a right L5-S1 facet ablation in December 2015. She did have some discogenic pain at the last visit with radicular symptoms referable to the right L4 nerve root, a right-sided L4-L5 transforaminal epidural was performed that provided resolution of the radicular pain, she still does have some mild axial back pain predominantly on the left side. We will help her lose significant additional weight before proceeding with further interventions on her spine.

## 2015-11-05 NOTE — Assessment & Plan Note (Signed)
Refilling phentermine, not much weight loss since last visit, I am going to add a GLP-1 agonist. Other follow-up for weight loss with PCP.

## 2015-11-05 NOTE — Progress Notes (Signed)
  Subjective:    CC: Follow-up  HPI: Lumbar spondylosis: Continues to do well with regards to right-sided facet mediated pain after a right L5-S1 facet radio frequency ablation in December 2015. At the last visit she was having some right-sided L4 radiculopathy with axial discogenic type back pain, this responded well to a right-sided L4-L5 transforaminal epidural injection. She is having a bit of left-sided axial back pain that she agrees to discuss at a future visit after some weight loss.  Obesity: No weight loss since last month on phentermine. Agreeable to start a second medication.  Past medical history, Surgical history, Family history not pertinant except as noted below, Social history, Allergies, and medications have been entered into the medical record, reviewed, and no changes needed.   Review of Systems: No fevers, chills, night sweats, weight loss, chest pain, or shortness of breath.   Objective:    General: Well Developed, well nourished, and in no acute distress.  Neuro: Alert and oriented x3, extra-ocular muscles intact, sensation grossly intact.  HEENT: Normocephalic, atraumatic, pupils equal round reactive to light, neck supple, no masses, no lymphadenopathy, thyroid nonpalpable.  Skin: Warm and dry, no rashes. Cardiac: Regular rate and rhythm, no murmurs rubs or gallops, no lower extremity edema.  Respiratory: Clear to auscultation bilaterally. Not using accessory muscles, speaking in full sentences.  Impression and Recommendations:    I spent 25 minutes with this patient, greater than 50% was face-to-face time counseling regarding the above diagnoses

## 2015-11-11 ENCOUNTER — Ambulatory Visit (INDEPENDENT_AMBULATORY_CARE_PROVIDER_SITE_OTHER): Payer: Medicare Other | Admitting: Family Medicine

## 2015-11-11 DIAGNOSIS — Z7189 Other specified counseling: Secondary | ICD-10-CM

## 2015-11-11 NOTE — Progress Notes (Signed)
   Subjective:    Patient ID: Regina Houston, female    DOB: 1951-01-23, 64 y.o.   MRN: [id number]  HPI  Patient here for teaching of Trulicity injections  Review of Systems     Objective:   Physical Exam   Patient has bruising and swelling on right lower leg from a fall at work.  Will keep to the RICE plan and call if she has any problems or severe pain      Assessment & Plan:   Instructed patient on how to administer self injections of Trulicity.  Followed along the teaching pamphlet with the medication and patient was able to administer the injection with no problem.  Patient understood directions and all questions were answered.

## 2015-11-17 ENCOUNTER — Other Ambulatory Visit: Payer: Self-pay | Admitting: Family Medicine

## 2015-11-17 ENCOUNTER — Other Ambulatory Visit: Payer: Self-pay | Admitting: Sports Medicine

## 2015-12-03 ENCOUNTER — Ambulatory Visit (INDEPENDENT_AMBULATORY_CARE_PROVIDER_SITE_OTHER): Payer: Medicare Other | Admitting: Family Medicine

## 2015-12-03 ENCOUNTER — Encounter: Payer: Self-pay | Admitting: Family Medicine

## 2015-12-03 VITALS — BP 125/56 | HR 54 | Wt 257.0 lb

## 2015-12-03 DIAGNOSIS — S8011XA Contusion of right lower leg, initial encounter: Secondary | ICD-10-CM

## 2015-12-03 DIAGNOSIS — Z6841 Body Mass Index (BMI) 40.0 and over, adult: Secondary | ICD-10-CM

## 2015-12-03 DIAGNOSIS — R635 Abnormal weight gain: Secondary | ICD-10-CM | POA: Diagnosis not present

## 2015-12-03 NOTE — Progress Notes (Signed)
Subjective:    Patient ID: Regina Houston, female    DOB: 1950-12-04, 65 y.o.   MRN: [id number]  HPI  Here to follow-up for obesity-BMI 69. She was started on Trulicity about a month ago and says she is doing very well with it. She has not had any side effects. No nausea. She's not had any difficulty with the pen device itself. She is doing some walking about 4 days a week.  No specific work out goal. She is using My Fitness Pal to  Work on reducing calories. It is set to around 1200 calories per day. She is dong well on the phentermien as well. No CP , flutters, etc.  Doing well drinking water.      [place] about 3 weeks ago at work. He was pushing a the cart which holds clothing. The whole thing tipped over and she fell forward with it. She hit her leg on the metal bar.  She suffered a contusion to the right lower leg. That was black and blue from her knee down to her ankle. She says it looks a whole lot better today but still swollen in the middle and still looks a little red. No fevers chills or sweats. She's not been treating it with anything specific. She has been icing it. She did stop her aspirin temporarily as well.   Review of Systems     Objective:   Physical Exam  Constitutional: She is oriented to person, place, and time. She appears well-developed and well-nourished.  HENT:  Head: Normocephalic and atraumatic.  Neck: Neck supple. No thyromegaly present.  Cardiovascular: Normal rate, regular rhythm and normal heart sounds.   Pulmonary/Chest: Effort normal and breath sounds normal.  Lymphadenopathy:    She has no cervical adenopathy.  Neurological: She is alert and oriented to person, place, and time.  Skin: Skin is warm and dry.  Psychiatric: She has a normal mood and affect. Her behavior is normal.   On the mid right shin she has a quite large area that is swollen and red and warm to touch. There is some peeling skin over the surface where it is apparent that the swelling has  actually gone down from the original injury. I don't see any open wounds or lacerations.       Assessment & Plan:  Abnormal weight gain - BMI 42  -we discussed some strategies to continue to keep the omentum that she has gained. She is actually lost 7 pounds in the last month with phentermine and Trulicity which is absolutely fantastic.  We discussed setting some very specific goals. Like to really have her increase her activity level. She is doing some walking 4 days a week but I really want her to work on intensity and timing. She says she might also start doing some stationary bike as well. Continue with my fitness pal. Reminded her to go in and make sure that she updates her current weight as it will change the calculated calories for weight loss needed. Will follow BP, may need to adjust her medication.  Also may need to recheck her TSH when I see her back to make sure we don't need to adjust her thyroid medication  Contusion right lower leg. It is still raised and swollen. Most consistent with a hematoma. I recommend compression with an Ace wrap or compression stockings that she has apparent home. Okay to restart her aspirin. Keep an eye on the redness. If it starts spreading or becomes more  painful or develops a fever or chills or sweats and is to come in to be evaluated immediately.

## 2015-12-04 ENCOUNTER — Other Ambulatory Visit: Payer: Self-pay | Admitting: Sports Medicine

## 2015-12-05 ENCOUNTER — Other Ambulatory Visit: Payer: Self-pay | Admitting: Sports Medicine

## 2015-12-05 ENCOUNTER — Telehealth: Payer: Self-pay | Admitting: Family Medicine

## 2015-12-05 NOTE — Telephone Encounter (Signed)
Pt left voicemail, stating she had a question. No further details provided. Attempted to return call, no answer. Left voicemail and callback information if she still needs our assistance.

## 2015-12-15 ENCOUNTER — Other Ambulatory Visit: Payer: Self-pay

## 2015-12-15 DIAGNOSIS — R635 Abnormal weight gain: Secondary | ICD-10-CM

## 2015-12-15 MED ORDER — PHENTERMINE HCL 37.5 MG PO TABS: 37.5000 mg | ORAL_TABLET | Freq: Every day | ORAL | Status: DC

## 2015-12-31 ENCOUNTER — Ambulatory Visit (INDEPENDENT_AMBULATORY_CARE_PROVIDER_SITE_OTHER): Payer: Medicare Other | Admitting: Family Medicine

## 2015-12-31 DIAGNOSIS — E119 Type 2 diabetes mellitus without complications: Secondary | ICD-10-CM | POA: Diagnosis not present

## 2015-12-31 MED ORDER — DULAGLUTIDE 1.5 MG/0.5ML ~~LOC~~ SOAJ: 1.5000 mg | SUBCUTANEOUS | Status: DC

## 2015-12-31 NOTE — Progress Notes (Signed)
   Subjective:    Patient ID: Regina Houston, female    DOB: [birth year], 65 y.o.   MRN: [id number]  HPIPatient is here for BP and weight check. She denies any problems with Trulicity self injection; doing weekly on Tuesdays. She has had a weight loss sin 12/03/15 and would like to continue.    Review of Systems     Objective:   Physical Exam        Assessment & Plan:  Patient has had loss of 1 pound in past 3 1/2 weeks; will schedule appointment for BP and Weight check in 4 weeks and appoint for DM review on 02/09/16. pak

## 2015-12-31 NOTE — Progress Notes (Signed)
Agree with below. \Aldean Pipe, MD  

## 2016-01-06 ENCOUNTER — Ambulatory Visit: Payer: Medicare Other | Admitting: Family Medicine

## 2016-01-12 ENCOUNTER — Other Ambulatory Visit: Payer: Self-pay | Admitting: Family Medicine

## 2016-01-12 ENCOUNTER — Other Ambulatory Visit: Payer: Self-pay | Admitting: Sports Medicine

## 2016-01-13 ENCOUNTER — Other Ambulatory Visit: Payer: Self-pay | Admitting: *Deleted

## 2016-01-13 DIAGNOSIS — R635 Abnormal weight gain: Secondary | ICD-10-CM

## 2016-01-13 MED ORDER — PHENTERMINE HCL 37.5 MG PO TABS: 37.5000 mg | ORAL_TABLET | Freq: Every day | ORAL | Status: DC

## 2016-02-10 ENCOUNTER — Encounter: Payer: Self-pay | Admitting: Family Medicine

## 2016-02-10 ENCOUNTER — Ambulatory Visit (INDEPENDENT_AMBULATORY_CARE_PROVIDER_SITE_OTHER): Payer: Medicare Other | Admitting: Family Medicine

## 2016-02-10 VITALS — BP 128/63 | HR 84 | Wt 252.0 lb

## 2016-02-10 DIAGNOSIS — R635 Abnormal weight gain: Secondary | ICD-10-CM | POA: Diagnosis not present

## 2016-02-10 DIAGNOSIS — I10 Essential (primary) hypertension: Secondary | ICD-10-CM

## 2016-02-10 DIAGNOSIS — E119 Type 2 diabetes mellitus without complications: Secondary | ICD-10-CM

## 2016-02-10 DIAGNOSIS — E049 Nontoxic goiter, unspecified: Secondary | ICD-10-CM

## 2016-02-10 LAB — TSH: TSH: 0.83 mIU/L

## 2016-02-10 LAB — POCT GLYCOSYLATED HEMOGLOBIN (HGB A1C): Hemoglobin A1C: 5.7

## 2016-02-10 MED ORDER — PHENTERMINE HCL 37.5 MG PO TABS: 37.5000 mg | ORAL_TABLET | Freq: Every day | ORAL | Status: DC

## 2016-02-10 NOTE — Progress Notes (Addendum)
   Subjective:    Patient ID: Regina Houston, female    DOB: September 16, 1951, 65 y.o.   MRN: [id number]  HPI  Hypertension- Pt denies chest pain, SOB, dizziness, or heart palpitations.  Taking meds as directed w/o problems.  Denies medication side effects.    Diabetes - no hypoglycemic events. No wounds or sores that are not healing well. No increased thirst or urination. Checking glucose at home. Taking medications as prescribed without any side effects.  Morbid obesity - She is on phentermine and tolerating well without any side effects or problems or palpitations or chest pain. Infectious down 4 more pounds since 6 weeks ago.She's been walking 3 days for per week for about 20-30 minutes. And she's been trying to eat around 1200-1300 cal per day. She's been using my fitness pal on and off but not consistently.  Hypothyroidism-no recent skin or hair changes. She has lost weight intentionally.  Review of Systems     Objective:   Physical Exam  Constitutional: She is oriented to person, place, and time. She appears well-developed and well-nourished.  HENT:  Head: Normocephalic and atraumatic.  Right Ear: External ear normal.  Left Ear: External ear normal.  Nose: Nose normal.  Mouth/Throat: Oropharynx is clear and moist.  TMs and canals are clear.   Eyes: Conjunctivae and EOM are normal. Pupils are equal, round, and reactive to light.  Neck: Neck supple. No thyromegaly present.  Cardiovascular: Normal rate, regular rhythm and normal heart sounds.   Pulmonary/Chest: Effort normal and breath sounds normal. She has no wheezes.  Lymphadenopathy:    She has no cervical adenopathy.  Neurological: She is alert and oriented to person, place, and time.  Skin: Skin is warm and dry.  Psychiatric: She has a normal mood and affect.          Assessment & Plan:  Hypertension- Well controlled. Continue current regimen. Follow up in 39months.   DM- hemoglobin A1c of 5.6. Well controlled. Continue  current regimen. Follow up in 3 months. She's been taking over 99991111 for Trulicity. Coupon card provided today.  Hypo thyroidism-due to recheck TSH  Morbid obesity/BMI [id number] with Trulicity and phentermine. Follow-up in one month for blood pressure and weight check. Discussed really working on increasing the number of days that she's walking. Ultimately I would like to see her exercising for at least 5 days per week for 30 minutes. We also discussed getting back on track with her my fitness pal to help set her new caloric goals. She would still like to lose around a pound a week if possible. Follow-up in one month for blood pressure and weight check.

## 2016-02-10 NOTE — Addendum Note (Signed)
Addended by: Teddy Spike on: 02/10/2016 09:59 AM   Modules accepted: Orders

## 2016-02-11 NOTE — Progress Notes (Signed)
Quick Note:  All labs are normal. ______ 

## 2016-02-24 ENCOUNTER — Other Ambulatory Visit: Payer: Self-pay | Admitting: Family Medicine

## 2016-03-01 ENCOUNTER — Other Ambulatory Visit: Payer: Self-pay | Admitting: Family Medicine

## 2016-03-01 ENCOUNTER — Ambulatory Visit (INDEPENDENT_AMBULATORY_CARE_PROVIDER_SITE_OTHER): Payer: Medicare Other | Admitting: Family Medicine

## 2016-03-01 ENCOUNTER — Encounter: Payer: Self-pay | Admitting: Family Medicine

## 2016-03-01 VITALS — BP 121/77 | HR 80 | Wt 255.0 lb

## 2016-03-01 DIAGNOSIS — M4726 Other spondylosis with radiculopathy, lumbar region: Secondary | ICD-10-CM | POA: Diagnosis not present

## 2016-03-01 NOTE — Assessment & Plan Note (Signed)
Lumbago without radiculopathy likely right L4 nerve root. This corresponds to existing stenosis at the same nerve root on MRI June 2015. Discussed options. Plan for physical therapy and epidural steroid injection.  Return following first injection.

## 2016-03-01 NOTE — Progress Notes (Signed)
Regina Houston is a 65 y.o. female who presents to [facility] today for right leg pain. Patient has a one-week history of right low back pain radiating to the right lateral and anterior calf and shin. She denies any injury. Her symptoms are consistent with previous episodes of right lumbar radiculopathy at the L4 nerve root. This was well treated with epidural steroid injections. She's tried Celebrex tramadol diclofenac and home exercises which have not helped. No bowel bladder dysfunction. No fevers or chills.   Past Medical History  Diagnosis Date  . Diverticulitis   . Diabetes mellitus 2007  . Hyperlipidemia   . Obesity    Past Surgical History  Procedure Laterality Date  . Abdominal hysterectomy  2008    fibroids  . 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 Foot surgery  2004    hammer toe  . Biopsy breast  2007    benign calcification   Social History  Substance Use Topics  . Smoking status: Never Smoker   . Smokeless tobacco: Not on file  . Alcohol Use: No   family history includes Diabetes in her maternal grandmother and mother; Heart disease in her maternal grandmother; Heart disease (age of onset: 30) in her father; Heart disease (age of onset: 41) in her mother; Hypertension in her mother; Kidney disease in her father; Stroke in her mother.  ROS:  No headache, visual changes, nausea, vomiting, diarrhea, constipation, dizziness, abdominal pain, skin rash, fevers, chills, night sweats, weight loss, swollen lymph nodes, body aches, joint swelling, muscle aches, chest pain, shortness of breath, mood changes, visual or auditory hallucinations.    Medications: Current Outpatient Prescriptions  Medication Sig Dispense Refill  . AMBULATORY NON FORMULARY MEDICATION Knee-high, medium compression, graduated compression stockings. Apply to lower extremities. 1 each 0  . AMBULATORY NON FORMULARY MEDICATION Medication Name:  Glucometer.  STrips and lancets to test twice daily.   Dx 250.00. 1 Units 2  . aspirin 81 MG tablet Take 81 mg by mouth daily.      . calcium carbonate (OS-CAL) 600 MG TABS Take 600 mg by mouth 2 (two) times daily with a meal.      . celecoxib (CELEBREX) 200 MG capsule TAKE ONE TO TWO CAPSULES BY MOUTH ONCE DAILY AS NEEDED FOR PAIN 60 capsule 5  . diclofenac (VOLTAREN) 75 MG EC tablet TAKE ONE TABLET BY MOUTH TWICE DAILY 60 tablet 0  . Dulaglutide (TRULICITY) 1.5 0000000 SOPN Inject 1.5 mg into the skin once a week. 4 pen 1  . fish oil-omega-3 fatty acids 1000 MG capsule Take 2 g by mouth daily.      . furosemide (LASIX) 20 MG tablet Take 1 tablet (20 mg total) by mouth daily. 90 tablet 1  . levothyroxine (SYNTHROID, LEVOTHROID) 100 MCG tablet TAKE ONE TABLET BY MOUTH ONCE DAILY 90 tablet 3  . lisinopril (PRINIVIL,ZESTRIL) 20 MG tablet TAKE ONE TABLET BY MOUTH ONCE DAILY 90 tablet 0  . metFORMIN (GLUCOPHAGE) 500 MG tablet TAKE ONE TABLET BY MOUTH TWICE DAILY WITH MEALS 180 tablet 1  . Multiple Vitamin (THERA) TABS Take 1 tablet by mouth.    . phentermine (ADIPEX-P) 37.5 MG tablet Take 1 tablet (37.5 mg total) by mouth daily before breakfast. 30 tablet 0  . simvastatin (ZOCOR) 20 MG tablet TAKE ONE TABLET BY MOUTH AT BEDTIME 90 tablet 0  . traMADol (ULTRAM) 50 MG tablet TAKE ONE TO TWO TABLETS BY MOUTH EVERY  8 HOURS AS NEEDED FOR PAIN MAXIMUM  OF  6  TABLETS  PER  DAY 90 tablet 0  . Urea 40 % LOTN Apply 1 application topically 2 (two) times daily as needed. 325 mL 11   No current facility-administered medications for this visit.   Allergies  Allergen Reactions  . Latex Rash  . Tape Rash     Exam:  BP 121/77 mmHg  Pulse 80  Wt 255 lb (115.667 kg) General: Well Developed, well nourished, and in no acute distress.  Neuro/Psych: Alert and oriented x3, extra-ocular muscles intact, able to move all 4 extremities, sensation grossly intact. Skin: Warm and dry, no rashes noted.  Respiratory:  Not using accessory muscles, speaking in full sentences, trachea midline.  Cardiovascular: Pulses palpable, no extremity edema. Abdomen: Does not appear distended. MSK: Back is nontender. Normal back motion some pain with extension.  Lower extremity strength is equal and normal throughout. Reflexes are diminished but equal bilaterally. Sensation is intact throughout. Normal gait. Hips nontender normal motion bilaterally.   MRI lumbar spine dated June 2015 reviewed  No results found for this or any previous visit (from the past 24 hour(s)). No results found.   Please see individual assessment and plan sections.

## 2016-03-01 NOTE — Patient Instructions (Signed)
Thank you for coming in today. Attend PT.  Return following injection.  Come back or go to the emergency room if you notice new weakness new numbness problems walking or bowel or bladder problems.  Radicular Pain Radicular pain in either the arm or leg is usually from a bulging or herniated disk in the spine. A piece of the herniated disk may press against the nerves as the nerves exit the spine. This causes pain which is felt at the tips of the nerves down the arm or leg. Other causes of radicular pain may include:  Fractures.  Heart disease.  Cancer.  An abnormal and usually degenerative state of the nervous system or nerves (neuropathy). Diagnosis may require CT or MRI scanning to determine the primary cause.  Nerves that start at the neck (nerve roots) may cause radicular pain in the outer shoulder and arm. It can spread down to the thumb and fingers. The symptoms vary depending on which nerve root has been affected. In most cases radicular pain improves with conservative treatment. Neck problems may require physical therapy, a neck collar, or cervical traction. Treatment may take many weeks, and surgery may be considered if the symptoms do not improve.  Conservative treatment is also recommended for sciatica. Sciatica causes pain to radiate from the lower back or buttock area down the leg into the foot. Often there is a history of back problems. Most patients with sciatica are better after 2 to 4 weeks of rest and other supportive care. Short term bed rest can reduce the disk pressure considerably. Sitting, however, is not a good position since this increases the pressure on the disk. You should avoid bending, lifting, and all other activities which make the problem worse. Traction can be used in severe cases. Surgery is usually reserved for patients who do not improve within the first months of treatment. Only take over-the-counter or prescription medicines for pain, discomfort, or fever as  directed by your caregiver. Narcotics and muscle relaxants may help by relieving more severe pain and spasm and by providing mild sedation. Cold or massage can give significant relief. Spinal manipulation is not recommended. It can increase the degree of disc protrusion. Epidural steroid injections are often effective treatment for radicular pain. These injections deliver medicine to the spinal nerve in the space between the protective covering of the spinal cord and back bones (vertebrae). Your caregiver can give you more information about steroid injections. These injections are most effective when given within two weeks of the onset of pain.  You should see your caregiver for follow up care as recommended. A program for neck and back injury rehabilitation with stretching and strengthening exercises is an important part of management.  SEEK IMMEDIATE MEDICAL CARE IF:  You develop increased pain, weakness, or numbness in your arm or leg.  You develop difficulty with bladder or bowel control.  You develop abdominal pain.   This information is not intended to replace advice given to you by your health care provider. Make sure you discuss any questions you have with your health care provider.   Document Released: 12/16/2004 Document Revised: 11/29/2014 Document Reviewed: 06/04/2015 Elsevier Interactive Patient Education [employer].

## 2016-03-04 ENCOUNTER — Ambulatory Visit (INDEPENDENT_AMBULATORY_CARE_PROVIDER_SITE_OTHER): Payer: Medicare Other | Admitting: Physical Therapy

## 2016-03-04 ENCOUNTER — Encounter: Payer: Self-pay | Admitting: Physical Therapy

## 2016-03-04 DIAGNOSIS — R252 Cramp and spasm: Secondary | ICD-10-CM

## 2016-03-04 DIAGNOSIS — M6281 Muscle weakness (generalized): Secondary | ICD-10-CM

## 2016-03-04 DIAGNOSIS — M5441 Lumbago with sciatica, right side: Secondary | ICD-10-CM

## 2016-03-04 NOTE — Patient Instructions (Addendum)
On Elbows (Prone)   [place] [phone]    Rise up on elbows as high as possible, keeping hips on floor. Hold __30-60__ seconds. Repeat __1__ times per set. Do _1___ sets per session. Do ___2_ sessions per day.  Press-Up    Press upper body upward, keeping hips in contact with floor. Keep lower back and buttocks relaxed. Hold __1-2__ seconds. Repeat __5__ times per set. Do __1__ sets per session. Do __2__ sessions per day.  Stretching: Piriformis (Supine)    Pull right knee toward opposite shoulder. Hold __30__ seconds. Relax. Repeat on other leg. Repeat __2__ times per set. Do __1__ sets per session. Do __2__ sessions per day. Copyright  VHI. All rights reserved.

## 2016-03-04 NOTE — Therapy (Signed)
[place], [place], [address] Phone: [phone]   Fax:  [phone]  Physical Therapy Evaluation  Patient Details  Name: Regina Houston MRN: [id number] Date of Birth: [birth year] Referring Provider: Dr Steva Colder  Encounter Date: 03/04/2016      PT End of Session - 03/04/16 1619    Visit Number 1   Number of Visits 12   Date for PT Re-Evaluation 04/15/16   PT Start Time 1627   PT Stop Time 1724   PT Time Calculation (min) 57 min   Activity Tolerance Patient tolerated treatment well      Past Medical History  Diagnosis Date  . Diverticulitis   . Diabetes mellitus 2007  . Hyperlipidemia   . Obesity     Past Surgical History  Procedure Laterality Date  . Abdominal hysterectomy  2008    fibroids  . 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 Foot surgery  2004    hammer toe  . Biopsy breast  2007    benign calcification    There were no vitals filed for this visit.       Subjective Assessment - 03/04/16 1627    Subjective Pt with a one week h/o LBP and Rt LE pain into the ankle.  Very similar to what she has had in the past and had successful treatment with injections. She has tried medication she had a home and her old HEP without any reilef. Referred to PT to help reduce pain and restore function and  having another injection 03/16/16     Pertinent History lumbar stenosis, has lost about 50 pounds recently.    Limitations Standing;Walking  sleeping   How long can you sit comfortably? not a problem   How long can you stand comfortably? tolerates 15 sec    How long can you walk comfortably? AM really hard to get moving, later in the day its a little better   Patient Stated Goals decrease pain, sleep at her previous level , cut her grass   Currently in Pain? Yes   Pain Score 8    Pain Location Back   Pain Orientation Right;Lower   Pain Descriptors / Indicators Burning;Sharp   crawling down the leg   Pain Type Acute pain   Pain Radiating Towards Rt ankle   Pain Onset 1 to 4 weeks ago   Pain Frequency Constant   Aggravating Factors  first thing in AM, bending   Pain Relieving Factors stretching ( knee to chest)             [facility] PT Assessment - 03/04/16 0001    Assessment   Medical Diagnosis OA lumbar spine with radiculapthy   Referring Provider Dr Steva Colder   Onset Date/Surgical Date 02/26/16   Next MD Visit after injection and some therapy   Prior Therapy 3-4 yrs ago   Precautions   Precautions None   Balance Screen   Has the patient fallen in the past 6 months Yes   How many times? 1  rolling a clothes rack at work and it turned over pulling he   Has the patient had a decrease in activity level because of a fear of falling?  No   Is the patient reluctant to leave their home because of a fear of falling?  No   Home Environment   Living Environment Private residence   Home Layout Two level  sometimes has to  take one step at a time depending on LE   Prior Function   Level of Independence Independent   Vocation Part time employment   Vocation Requirements works at [employer] go to movies, being outside, cut her grass   Observation/Other Assessments   Focus on Therapeutic Outcomes (FOTO)  55% limited   Functional Tests   Functional tests Squat   Squat   Comments shift to the Lt due to Rt LE pain   Posture/Postural Control   Posture/Postural Control Postural limitations   Postural Limitations Increased lumbar lordosis  extra abdominal girth ( losing weight)    ROM / Strength   AROM / PROM / Strength AROM;Strength   AROM   Overall AROM Comments LE's limited by soft tissue approximation   AROM Assessment Site Lumbar   Lumbar Flexion WNL, with stretching    Lumbar Extension decreased 90% with pain   Lumbar - Right Side Bend WNL   Lumbar - Left Side Bend WNL   Lumbar - Right Rotation WNL with pain Rt low back    Lumbar - Left  Rotation WNL   Strength   Strength Assessment Site Hip;Knee;Ankle;Lumbar   Right/Left Hip Right;Left   Right Hip Flexion 4+/5   Right Hip Extension 4-/5  with pain in back   Right Hip ABduction 4-/5   Left Hip Flexion 5/5   Left Hip Extension 5/5   Left Hip External Rotation --  pain in Rt low back    Left Hip ABduction 5/5   Right/Left Knee Right;Left   Right Knee Flexion 4+/5   Right Knee Extension 5/5   Left Knee Flexion 5/5   Right/Left Ankle --  bilat WNL   Lumbar Flexion --  TA poor   Lumbar Extension --  multifidi fair.    Flexibility   Soft Tissue Assessment /Muscle Length --   Palpation   Spinal mobility hypomobile L4-5 pain with UPA mobs bilat.    Palpation comment very tender in Rt gluts and piriformis, some tightness in Rt lumbar paraspinals    Special Tests    Special Tests Lumbar   Lumbar Tests Slump Test;Straight Leg Raise   Slump test   Findings Positive   Side Right   Ambulation/Gait   Gait Pattern Wide base of support  bilat LE external rotation                   [facility] Adult PT Treatment/Exercise - 03/04/16 0001    Exercises   Exercises Lumbar   Lumbar Exercises: Stretches   Prone on Elbows Stretch 30 seconds   Press Ups 5 reps   Piriformis Stretch 2 reps;20 seconds  modified each side   Modalities   Modalities Ultrasound   Ultrasound   Ultrasound Location Rt upper gluts/piriformis   Ultrasound Parameters 100%, 1.5w/cm2, 1.0 mHz   Ultrasound Goals Pain   Manual Therapy   Manual Therapy Soft tissue mobilization   Soft tissue mobilization Rt gluts, piriformis TPR                PT Education - 03/04/16 1713    Education provided Yes   Education Details HEP   Person(s) Educated Patient   Methods Explanation;Demonstration;Handout   Comprehension Returned demonstration;Verbalized understanding             PT Long Term Goals - 03/04/16 1613    PT LONG TERM GOAL #1   Title I with HEP ( 04/14/16)    Time 6  Period Weeks   Status New   PT LONG TERM GOAL #2   Title report overall pain decrease =/> 75% with daily activity ( 04/14/16)    Time 6   Period Weeks   Status New   PT LONG TERM GOAL #3   Title increase strength Rt hip =/> 5-/5 to support body with standing ( 04/14/16)    Time 6   Period Weeks   Status New   PT LONG TERM GOAL #4   Title improve FOTO =/< 34% limited, CJ level ( 04/14/16)    Time 6   Period Weeks   Status New   PT LONG TERM GOAL #5   Title sleep per her previous level and not wake due to low back pain ( 04/14/16)    Time 6   Period Weeks   Status New               Plan - 03/04/16 1614    Clinical Impression Statement 66 yo female with 1-2 wk onset of Rt sided low back and LE pain.  She is tight and tender in the Rt gluts/piriformis and lumbar paraspinals. We has some core and Rt hip weakness as well. She responded well to combo treatment.    Rehab Potential Good   PT Frequency 2x / week   PT Duration 6 weeks   PT Treatment/Interventions Manual techniques;Therapeutic exercise;Moist Heat;Taping;Dry needling;Cryotherapy;Electrical Stimulation;Functional mobility training;Patient/family education;Neuromuscular re-education;Ultrasound;Traction   PT Next Visit Plan combo and manual work to Rt gluts/piriformis, core stab ex.    Consulted and Agree with Plan of Care Patient      Patient will benefit from skilled therapeutic intervention in order to improve the following deficits and impairments:  Postural dysfunction, Decreased strength, Hypomobility, Pain, Increased muscle spasms  Visit Diagnosis: Right-sided low back pain with right-sided sciatica - Plan: PT plan of care cert/re-cert  Muscle weakness (generalized) - Plan: PT plan of care cert/re-cert  Cramp and spasm - Plan: PT plan of care cert/re-cert     Problem List Patient Active Problem List   Diagnosis Date Noted  . Left foot pain 01/10/2015  . Severe obesity (BMI >= 40) ([place]) 08/28/2014  .  Cataract 08/12/2014  . Unspecified hypothyroidism 05/28/2014  . Lumbar spondylosis 02/26/2014  . Adult hypothyroidism 02/01/2014  . HLD (hyperlipidemia) 02/01/2014  . Essential hypertension 09/20/2011  . Osteoarthritis of both knees 09/20/2011  . MORBID OBESITY 04/22/2010  . POSTMENOPAUSAL STATUS 04/22/2010  . TROCHANTERIC BURSITIS, RIGHT 12/03/2009  . Goiter 04/01/2009  . Diabetes mellitus ([place]) 04/01/2009    Jeral Pinch PT 03/04/2016, 5:34 PM  [facility] [place], [place], [address] Phone: [phone]   Fax:  [phone]  Name: Regina Houston MRN: [id number] Date of Birth: 1951-06-17

## 2016-03-08 ENCOUNTER — Ambulatory Visit (INDEPENDENT_AMBULATORY_CARE_PROVIDER_SITE_OTHER): Payer: Medicare Other | Admitting: Rehabilitation

## 2016-03-08 DIAGNOSIS — M5441 Lumbago with sciatica, right side: Secondary | ICD-10-CM | POA: Diagnosis not present

## 2016-03-08 DIAGNOSIS — M6281 Muscle weakness (generalized): Secondary | ICD-10-CM | POA: Diagnosis not present

## 2016-03-08 NOTE — Therapy (Signed)
[place], [place], [address] Phone: [phone]   Fax:  [phone]  Physical Therapy Treatment  Patient Details  Name: Regina Houston MRN: [id number] Date of Birth: 1951/03/22 Referring Provider: Dr Steva Colder  Encounter Date: 03/08/2016      PT End of Session - 03/08/16 1001    Visit Number 2   Number of Visits 12   Date for PT Re-Evaluation 04/15/16   PT Start Time 0920   PT Stop Time 1015   PT Time Calculation (min) 55 min   Activity Tolerance Patient tolerated treatment well      Past Medical History  Diagnosis Date  . Diverticulitis   . Diabetes mellitus 2007  . Hyperlipidemia   . Obesity     Past Surgical History  Procedure Laterality Date  . Abdominal hysterectomy  2008    fibroids  . 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 Foot surgery  2004    hammer toe  . Biopsy breast  2007    benign calcification    There were no vitals filed for this visit.      Subjective Assessment - 03/08/16 0922    Subjective Feeling about the same as eval.  It is worse in the morning.     Currently in Pain? Yes   Pain Score 8    Pain Location Back   Pain Orientation Right;Lower  tingling in the L leg   Pain Descriptors / Indicators Burning   Pain Type Acute pain   Aggravating Factors  first thing in the AM, bending   Pain Relieving Factors stretching                         [facility] Adult PT Treatment/Exercise - 03/08/16 0001    Lumbar Exercises: Stretches   Passive Hamstring Stretch 2 reps;30 seconds   Passive Hamstring Stretch Limitations strap   Single Knee to Chest Stretch 2 reps;20 seconds   Single Knee to Chest Stretch Limitations bil   Double Knee to Chest Stretch 2 reps;20 seconds   Lower Trunk Rotation 5 reps   Prone on Elbows Stretch 30 seconds   Press Ups 5 reps   Piriformis Stretch 2 reps;20 seconds   Modalities   Modalities Ultrasound;Moist  Heat   Moist Heat Therapy   Number Minutes Moist Heat 15 Minutes   Moist Heat Location Lumbar Spine;Hip   Ultrasound   Ultrasound Location Rt upper gluts/piriformis   Ultrasound Parameters 100%, 1.5w/cm2, 77mHz   Ultrasound Goals Pain   Manual Therapy   Manual Therapy Soft tissue mobilization   Soft tissue mobilization Rt gluts, piriformis TPR                     PT Long Term Goals - 03/04/16 1613    PT LONG TERM GOAL #1   Title I with HEP ( 04/14/16)    Time 6   Period Weeks   Status New   PT LONG TERM GOAL #2   Title report overall pain decrease =/> 75% with daily activity ( 04/14/16)    Time 6   Period Weeks   Status New   PT LONG TERM GOAL #3   Title increase strength Rt hip =/> 5-/5 to support body with standing ( 04/14/16)    Time 6   Period Weeks   Status New   PT LONG TERM GOAL #4  Title improve FOTO =/< 34% limited, CJ level ( 04/14/16)    Time 6   Period Weeks   Status New   PT LONG TERM GOAL #5   Title sleep per her previous level and not wake due to low back pain ( 04/14/16)    Time 6   Period Weeks   Status New               Plan - 03/08/16 1001    Clinical Impression Statement tolerated all gentle stretches and therapeutic exercise well.  continues with high pain level today.     PT Next Visit Plan combo and manual work to Rt gluts/piriformis, core stab ex.    Consulted and Agree with Plan of Care Patient      Patient will benefit from skilled therapeutic intervention in order to improve the following deficits and impairments:  Postural dysfunction, Decreased strength, Hypomobility, Pain, Increased muscle spasms  Visit Diagnosis: Right-sided low back pain with right-sided sciatica  Muscle weakness (generalized)     Problem List Patient Active Problem List   Diagnosis Date Noted  . Left foot pain 01/10/2015  . Severe obesity (BMI >= 40) ([place]) 08/28/2014  . Cataract 08/12/2014  . Unspecified hypothyroidism 05/28/2014  .  Lumbar spondylosis 02/26/2014  . Adult hypothyroidism 02/01/2014  . HLD (hyperlipidemia) 02/01/2014  . Essential hypertension 09/20/2011  . Osteoarthritis of both knees 09/20/2011  . MORBID OBESITY 04/22/2010  . POSTMENOPAUSAL STATUS 04/22/2010  . TROCHANTERIC BURSITIS, RIGHT 12/03/2009  . Goiter 04/01/2009  . Diabetes mellitus ([place]) 04/01/2009    Stark Bray, DPT, CMP 03/08/2016, 10:03 AM  [facility] [place], [place], [address] Phone: [phone]   Fax:  [phone]  Name: Malessa Hibdon MRN: [id number] Date of Birth: 07/27/1951

## 2016-03-09 ENCOUNTER — Ambulatory Visit (INDEPENDENT_AMBULATORY_CARE_PROVIDER_SITE_OTHER): Payer: Medicare Other | Admitting: Family Medicine

## 2016-03-09 VITALS — BP 125/77 | HR 82 | Wt 253.0 lb

## 2016-03-09 DIAGNOSIS — R635 Abnormal weight gain: Secondary | ICD-10-CM | POA: Diagnosis not present

## 2016-03-09 NOTE — Progress Notes (Signed)
Regina Houston is here for blood pressure and weight check. Denies trouble sleeping or palpitations. She was unable to exercise due to back pain.   Patient has gained weight. A refill will not be sent to the pharmacy.

## 2016-03-09 NOTE — Progress Notes (Signed)
Left information on Pt's VM, callback information provided to schedule an appt with PCP.

## 2016-03-09 NOTE — Progress Notes (Signed)
She is welcome to schedule appointment to discuss options.

## 2016-03-10 ENCOUNTER — Ambulatory Visit (INDEPENDENT_AMBULATORY_CARE_PROVIDER_SITE_OTHER): Payer: Medicare Other | Admitting: Physical Therapy

## 2016-03-10 DIAGNOSIS — R252 Cramp and spasm: Secondary | ICD-10-CM | POA: Diagnosis not present

## 2016-03-10 DIAGNOSIS — M6281 Muscle weakness (generalized): Secondary | ICD-10-CM | POA: Diagnosis not present

## 2016-03-10 DIAGNOSIS — M5441 Lumbago with sciatica, right side: Secondary | ICD-10-CM

## 2016-03-10 NOTE — Patient Instructions (Signed)
  Abdominal Bracing With Pelvic Floor (Hook-Lying)   With neutral spine, tighten pelvic floor and abdominals. Hold 5-10 seconds. Repeat __10_ times. Do _1__ times a day.   Knee to Chest: Transverse Plane Stability   Bring one knee up, then return. Be sure pelvis does not roll side to side. Keep pelvis still. Lift knee __10_ times each leg. Restabilize pelvis. Repeat with other leg. Do _1-2__ sets, _1__ times per day.   Hip External Rotation With Pillow: Transverse Plane Stability   One knee bent, one leg straight, on pillow. Slowly roll bent knee out. Be sure pelvis does not rotate. Do _10__ times. Restabilize pelvis. Repeat with other leg. Do _1-2__ sets, _1__ times per day.   [facility] [place], [place] [address]  [phone] (office) [phone] (fax)

## 2016-03-10 NOTE — Therapy (Signed)
[place], [place], [address] Phone: [phone]   Fax:  [phone]  Physical Therapy Treatment  Patient Details  Name: Regina Houston MRN: [id number] Date of Birth: [birth year] Referring Provider: Dr. Georgina Snell   Encounter Date: 03/10/2016      PT End of Session - 03/10/16 1603    Visit Number 3   Number of Visits 12   Date for PT Re-Evaluation 04/15/16   PT Start Time 1603   PT Stop Time 1657   PT Time Calculation (min) 54 min   Activity Tolerance Patient tolerated treatment well      Past Medical History  Diagnosis Date  . Diverticulitis   . Diabetes mellitus 2007  . Hyperlipidemia   . Obesity     Past Surgical History  Procedure Laterality Date  . Abdominal hysterectomy  2008    fibroids  . 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 Foot surgery  2004    hammer toe  . Biopsy breast  2007    benign calcification    There were no vitals filed for this visit.      Subjective Assessment - 03/10/16 1607    Subjective Pt reports she is now feeling a strange numb sensation in Rt calf.  Pt reports she doesn't feel much different from last session. Pain varies during day.     Currently in Pain? Yes   Pain Score 5    Pain Location Back   Pain Orientation Right            [facility] PT Assessment - 03/10/16 0001    Assessment   Referring Provider Dr. Georgina Snell    Onset Date/Surgical Date 02/26/16   Next MD Visit after injection and some therapy          [facility] Adult PT Treatment/Exercise - 03/10/16 0001    Lumbar Exercises: Stretches   Single Knee to Chest Stretch 2 reps;20 seconds   Single Knee to Chest Stretch Limitations bil   Double Knee to Chest Stretch 2 reps;20 seconds   Prone on Elbows Stretch 2 reps;30 seconds   Press Ups 5 reps   Piriformis Stretch 2 reps;20 seconds   Lumbar Exercises: Aerobic   Stationary Bike NuStep L3: 6 min   Lumbar Exercises: Supine   Ab  Set 10 reps;5 seconds   Clam 5 reps  each leg, with ab set   Bent Knee Raise 5 reps  each leg with ab set   Modalities   Modalities Electrical Stimulation;Moist Heat   Moist Heat Therapy   Number Minutes Moist Heat 15 Minutes   Moist Heat Location Lumbar Spine;Hip   Electrical Stimulation   Electrical Stimulation Location Rt lumbar paraspinals, Rt hip    Electrical Stimulation Action IFC   Electrical Stimulation Parameters  to tolerance    Electrical Stimulation Goals Pain          PT Long Term Goals - 03/10/16 1652    PT LONG TERM GOAL #1   Title I with HEP ( 04/14/16)    Time 6   Period Weeks   Status On-going   PT LONG TERM GOAL #2   Title report overall pain decrease =/> 75% with daily activity ( 04/14/16)    Time 6   Period Weeks   Status On-going   PT LONG TERM GOAL #3   Title increase strength Rt hip =/> 5-/5 to support body with standing ( 04/14/16)  Time 6   Period Weeks   Status On-going   PT LONG TERM GOAL #4   Title improve FOTO =/< 34% limited, CJ level ( 04/14/16)    Time 6   Period Weeks   Status On-going   PT LONG TERM GOAL #5   Title sleep per her previous level and not wake due to low back pain ( 04/14/16)    Time 6   Period Weeks   Status On-going               Plan - 03/10/16 1650    Clinical Impression Statement Pt tolerated all gentle stretches and core work without increase in pain. Pt did require frequent tactile/ VC for improved TA contraction.  Pt reported decrease in pain at end of session, after use of estim and MHP.  Making gradual progress towards goals.    Rehab Potential Good   PT Frequency 2x / week   PT Duration 6 weeks   PT Treatment/Interventions Manual techniques;Therapeutic exercise;Moist Heat;Taping;Dry needling;Cryotherapy;Electrical Stimulation;Functional mobility training;Patient/family education;Neuromuscular re-education;Ultrasound;Traction   PT Next Visit Plan Assess response to MHP+estim vs combo .  continue  manual work to Rt gluts/piriformis, core stab ex.    Consulted and Agree with Plan of Care Patient      Patient will benefit from skilled therapeutic intervention in order to improve the following deficits and impairments:  Postural dysfunction, Decreased strength, Hypomobility, Pain, Increased muscle spasms  Visit Diagnosis: Right-sided low back pain with right-sided sciatica  Muscle weakness (generalized)  Cramp and spasm     Problem List Patient Active Problem List   Diagnosis Date Noted  . Left foot pain 01/10/2015  . Severe obesity (BMI >= 40) ([place]) 08/28/2014  . Cataract 08/12/2014  . Unspecified hypothyroidism 05/28/2014  . Lumbar spondylosis 02/26/2014  . Adult hypothyroidism 02/01/2014  . HLD (hyperlipidemia) 02/01/2014  . Essential hypertension 09/20/2011  . Osteoarthritis of both knees 09/20/2011  . MORBID OBESITY 04/22/2010  . POSTMENOPAUSAL STATUS 04/22/2010  . TROCHANTERIC BURSITIS, RIGHT 12/03/2009  . Goiter 04/01/2009  . Diabetes mellitus ([place]) 04/01/2009   Kerin Perna, PTA 03/10/2016 4:54 PM  [place], [place], [address] Phone: [phone]   Fax:  [phone]  Name: Regina Houston MRN: [id number] Date of Birth: Mar 03, 1951

## 2016-03-15 ENCOUNTER — Ambulatory Visit (INDEPENDENT_AMBULATORY_CARE_PROVIDER_SITE_OTHER): Payer: Medicare Other | Admitting: Physical Therapy

## 2016-03-15 ENCOUNTER — Encounter: Payer: Self-pay | Admitting: Physical Therapy

## 2016-03-15 ENCOUNTER — Encounter: Payer: Medicare Other | Admitting: Rehabilitative and Restorative Service Providers"

## 2016-03-15 DIAGNOSIS — R252 Cramp and spasm: Secondary | ICD-10-CM | POA: Diagnosis not present

## 2016-03-15 DIAGNOSIS — M5441 Lumbago with sciatica, right side: Secondary | ICD-10-CM | POA: Diagnosis not present

## 2016-03-15 DIAGNOSIS — M6281 Muscle weakness (generalized): Secondary | ICD-10-CM | POA: Diagnosis not present

## 2016-03-15 NOTE — Therapy (Signed)
[place], [place], [address] Phone: [phone]   Fax:  [phone]  Physical Therapy Treatment  Patient Details  Name: Regina Houston MRN: [id number] Date of Birth: Nov 18, 1951 Referring Provider: Dr. Georgina Snell   Encounter Date: 03/15/2016      PT End of Session - 03/15/16 1522    Visit Number 4   Number of Visits 12   Date for PT Re-Evaluation 04/15/16   PT Start Time 1519   PT Stop Time 1612   PT Time Calculation (min) 53 min   Activity Tolerance Patient tolerated treatment well      Past Medical History  Diagnosis Date  . Diverticulitis   . Diabetes mellitus 2007  . Hyperlipidemia   . Obesity     Past Surgical History  Procedure Laterality Date  . Abdominal hysterectomy  2008    fibroids  . 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 Foot surgery  2004    hammer toe  . Biopsy breast  2007    benign calcification    There were no vitals filed for this visit.      Subjective Assessment - 03/15/16 1520    Subjective Amayiah reports she just got off work, walking alot, by the time she got home the pain returned, mornings are still awful.    Currently in Pain? Yes   Pain Score 5    Pain Location Back   Pain Orientation Right   Pain Descriptors / Indicators Burning   Pain Type Acute pain   Pain Onset More than a month ago   Pain Frequency Constant   Aggravating Factors  bending and AM   Pain Relieving Factors stretching and walking                         [facility] Adult PT Treatment/Exercise - 03/15/16 0001    Lumbar Exercises: Stretches   Lower Trunk Rotation --  10 reps   Lumbar Exercises: Aerobic   Stationary Bike Nustep L4x7'   Lumbar Exercises: Supine   Ab Set 5 reps;5 seconds   Bridge 20 reps   Isometric Hip Flexion 10 reps  ipsilateral and contralateral   Lumbar Exercises: Prone   Other Prone Lumbar Exercises press ups x 10   Modalities   Modalities Electrical Stimulation;Moist Heat   Moist Heat Therapy   Number Minutes Moist Heat 15 Minutes   Moist Heat Location Lumbar Spine;Hip   Electrical Stimulation   Electrical Stimulation Location Rt lumbar paraspinals, Rt hip    Electrical Stimulation Action IFC   Electrical Stimulation Parameters to tolerance   Electrical Stimulation Goals Pain   Manual Therapy   Manual Therapy Joint mobilization;Soft tissue mobilization   Joint Mobilization grade III+ to L3-5 CPA and bilat UPA mobs    Soft tissue mobilization Rt QL, lumbar paraspinals.                      PT Long Term Goals - 03/15/16 1543    PT LONG TERM GOAL #1   Title I with HEP ( 04/14/16)    Time 6   Period Weeks   Status On-going   PT LONG TERM GOAL #2   Title report overall pain decrease =/> 75% with daily activity ( 04/14/16)    Time 6   Period Weeks   Status On-going   PT LONG TERM GOAL #3   Title  increase strength Rt hip =/> 5-/5 to support body with standing ( 04/14/16)    Time 6   Period Weeks   Status On-going   PT LONG TERM GOAL #4   Title improve FOTO =/< 34% limited, CJ level ( 04/14/16)    Time 6   Period Weeks   Status On-going   PT LONG TERM GOAL #5   Title sleep per her previous level and not wake due to low back pain ( 04/14/16)    Time 6   Period Weeks   Status On-going               Plan - 03/15/16 1558    Clinical Impression Statement Rylynne has good relief after treatments however her symptoms return within a few hrs after treatment.  She is scheduled to have epidural injections tomorrow. No goals met.  Will see how she does after injections    Rehab Potential Good   PT Frequency 2x / week   PT Duration 6 weeks   PT Treatment/Interventions Manual techniques;Therapeutic exercise;Moist Heat;Taping;Dry needling;Cryotherapy;Electrical Stimulation;Functional mobility training;Patient/family education;Neuromuscular re-education;Ultrasound;Traction   PT Next Visit Plan if  still in pain after injection trial traction   Consulted and Agree with Plan of Care Patient      Patient will benefit from skilled therapeutic intervention in order to improve the following deficits and impairments:  Postural dysfunction, Decreased strength, Hypomobility, Pain, Increased muscle spasms  Visit Diagnosis: Right-sided low back pain with right-sided sciatica  Muscle weakness (generalized)  Cramp and spasm     Problem List Patient Active Problem List   Diagnosis Date Noted  . Left foot pain 01/10/2015  . Severe obesity (BMI >= 40) ([place]) 08/28/2014  . Cataract 08/12/2014  . Unspecified hypothyroidism 05/28/2014  . Lumbar spondylosis 02/26/2014  . Adult hypothyroidism 02/01/2014  . HLD (hyperlipidemia) 02/01/2014  . Essential hypertension 09/20/2011  . Osteoarthritis of both knees 09/20/2011  . MORBID OBESITY 04/22/2010  . POSTMENOPAUSAL STATUS 04/22/2010  . TROCHANTERIC BURSITIS, RIGHT 12/03/2009  . Goiter 04/01/2009  . Diabetes mellitus ([place]) 04/01/2009    Jeral Pinch PT 03/15/2016, 4:00 PM  [facility] [place], [place], [address] Phone: [phone]   Fax:  [phone]  Name: Koree Staheli MRN: [id number] Date of Birth: Apr 09, 1951

## 2016-03-16 ENCOUNTER — Ambulatory Visit
Admission: RE | Admit: 2016-03-16 | Discharge: 2016-03-16 | Disposition: A | Discharge status: Home / Self Care | Payer: No Typology Code available for payment source | Source: Ambulatory Visit | Attending: Family Medicine | Admitting: Family Medicine

## 2016-03-16 VITALS — BP 184/91 | HR 80

## 2016-03-16 DIAGNOSIS — E785 Hyperlipidemia, unspecified: Secondary | ICD-10-CM

## 2016-03-16 DIAGNOSIS — I10 Essential (primary) hypertension: Secondary | ICD-10-CM

## 2016-03-16 DIAGNOSIS — M47817 Spondylosis without myelopathy or radiculopathy, lumbosacral region: Secondary | ICD-10-CM | POA: Diagnosis not present

## 2016-03-16 DIAGNOSIS — E049 Nontoxic goiter, unspecified: Secondary | ICD-10-CM

## 2016-03-16 DIAGNOSIS — H269 Unspecified cataract: Secondary | ICD-10-CM

## 2016-03-16 DIAGNOSIS — M79672 Pain in left foot: Secondary | ICD-10-CM

## 2016-03-16 MED ORDER — METHYLPREDNISOLONE ACETATE 40 MG/ML INJ SUSP (RADIOLOG
120.0000 mg | Freq: Once | INTRAMUSCULAR | Status: AC
  Administered 2016-03-16: 120 mg via EPIDURAL

## 2016-03-16 MED ORDER — IOHEXOL 180 MG/ML  SOLN
1.0000 mL | Freq: Once | INTRAMUSCULAR | Status: AC | PRN
  Administered 2016-03-16: 1 mL via EPIDURAL

## 2016-03-18 ENCOUNTER — Encounter: Payer: Medicare Other | Admitting: Rehabilitative and Restorative Service Providers"

## 2016-03-18 ENCOUNTER — Ambulatory Visit (INDEPENDENT_AMBULATORY_CARE_PROVIDER_SITE_OTHER): Payer: Medicare Other | Admitting: Physical Therapy

## 2016-03-18 DIAGNOSIS — M6281 Muscle weakness (generalized): Secondary | ICD-10-CM | POA: Diagnosis not present

## 2016-03-18 DIAGNOSIS — R252 Cramp and spasm: Secondary | ICD-10-CM | POA: Diagnosis not present

## 2016-03-18 DIAGNOSIS — M5441 Lumbago with sciatica, right side: Secondary | ICD-10-CM | POA: Diagnosis not present

## 2016-03-18 NOTE — Therapy (Signed)
Regina Houston [place], [place], [address] Phone: [phone]   Fax:  [phone]  Physical Therapy Treatment  Patient Details  Name: Regina Houston MRN: [id number] Date of Birth: February 04, 1951 Referring Provider: Dr. Georgina Snell   Encounter Date: 03/18/2016      PT End of Session - 03/18/16 1103    Visit Number 5   Number of Visits 12   Date for PT Re-Evaluation 04/15/16   PT Start Time 1100   PT Stop Time 1205   PT Time Calculation (min) 65 min      Past Medical History  Diagnosis Date  . Diverticulitis   . Diabetes mellitus 2007  . Hyperlipidemia   . Obesity     Past Surgical History  Procedure Laterality Date  . Abdominal hysterectomy  2008    fibroids  . 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 Foot surgery  2004    hammer toe  . Biopsy breast  2007    benign calcification    There were no vitals filed for this visit.      Subjective Assessment - 03/18/16 1103    Subjective Pt reports she had her injection in Rt side of low back 2 days ago.  She had instant relief of pain immediately after injection, but pain returned later that afternoon.     Patient Stated Goals decrease pain, sleep at her previous level , cut her grass   Currently in Pain? Yes   Pain Score 5    Pain Location Back   Pain Orientation Right   Pain Descriptors / Indicators [facility] Adult PT Treatment/Exercise - 03/18/16 0001    Lumbar Exercises: Stretches   Passive Hamstring Stretch 2 reps;30 seconds   Single Knee to Chest Stretch 2 reps;20 seconds   Single Knee to Chest Stretch Limitations bil   Double Knee to Chest Stretch 2 reps;20 seconds   Lower Trunk Rotation --  10 reps, 2 sets   Prone on Elbows Stretch 2 reps;20 seconds   Lumbar Exercises: Aerobic   Stationary Bike NuStep L3: 6 min   Lumbar Exercises: Supine   Bridge 10 reps   Isometric Hip Flexion 10 reps  ipsilateral and contralateral    Isometric Hip Flexion Limitations (tightening core when lowering leg to mat)   Lumbar Exercises: Prone   Other Prone Lumbar Exercises press ups x 10   Modalities   Modalities Electrical Stimulation;Moist Heat   Moist Heat Therapy   Number Minutes Moist Heat 15 Minutes   Moist Heat Location Lumbar Spine;Hip   Electrical Stimulation   Electrical Stimulation Location Rt lumbar paraspinals, Rt hip    Electrical Stimulation Action IFC   Electrical Stimulation Parameters to tolerance    Electrical Stimulation Goals Pain   Manual Therapy   Manual Therapy Joint mobilization   Manual therapy comments joint mobs provided by supervising PT, Celyn Holt.    Joint Mobilization grade III+ to L1-5 CPA and bilat UPA mobs    Soft tissue mobilization deep tissue along lateral border of Rt sacrum      Education: pt educated on self massage to Rt lowback/ glute with ball.  Pt returned demo and verbalized understanding.       PT Long Term Goals - 03/15/16 1543    PT LONG TERM GOAL #1   Title I with HEP ( 04/14/16)    Time 6   Period  Weeks   Status On-going   PT LONG TERM GOAL #2   Title report overall pain decrease =/> 75% with daily activity ( 04/14/16)    Time 6   Period Weeks   Status On-going   PT LONG TERM GOAL #3   Title increase strength Rt hip =/> 5-/5 to support body with standing ( 04/14/16)    Time 6   Period Weeks   Status On-going   PT LONG TERM GOAL #4   Title improve FOTO =/< 34% limited, CJ level ( 04/14/16)    Time 6   Period Weeks   Status On-going   PT LONG TERM GOAL #5   Title sleep per her previous level and not wake due to low back pain ( 04/14/16)    Time 6   Period Weeks   Status On-going               Plan - 03/18/16 1138    Clinical Impression Statement Pt had temporary relief from injection.  Pt reports pain has returned to same level as prior to injection.  Pt did report decrease in symptoms with supine/ prone exercises and further decrease in pain  manual therapy and estim.  Making gradual progress towards goals.    Rehab Potential Good   PT Frequency 2x / week   PT Duration 6 weeks   PT Treatment/Interventions Manual techniques;Therapeutic exercise;Moist Heat;Taping;Dry needling;Cryotherapy;Electrical Stimulation;Functional mobility training;Patient/family education;Neuromuscular re-education;Ultrasound;Traction   PT Next Visit Plan Trial traction next visit. Continue core strengthening/ LE stretches.    Consulted and Agree with Plan of Care Patient      Patient will benefit from skilled therapeutic intervention in order to improve the following deficits and impairments:  Postural dysfunction, Decreased strength, Hypomobility, Pain, Increased muscle spasms  Visit Diagnosis: Right-sided low back pain with right-sided sciatica  Muscle weakness (generalized)  Cramp and spasm     Problem List Patient Active Problem List   Diagnosis Date Noted  . Left foot pain 01/10/2015  . Severe obesity (BMI >= 40) ([place]) 08/28/2014  . Cataract 08/12/2014  . Unspecified hypothyroidism 05/28/2014  . Lumbar spondylosis 02/26/2014  . Adult hypothyroidism 02/01/2014  . HLD (hyperlipidemia) 02/01/2014  . Essential hypertension 09/20/2011  . Osteoarthritis of both knees 09/20/2011  . MORBID OBESITY 04/22/2010  . POSTMENOPAUSAL STATUS 04/22/2010  . TROCHANTERIC BURSITIS, RIGHT 12/03/2009  . Goiter 04/01/2009  . Diabetes mellitus ([place]) 04/01/2009   Anderson Malta Carlson-Long, PTA 03/18/2016 12:05 PM   Celyn P. Helene Kelp PT, MPH 03/18/2016 12:05 PM    [place], [place], [address] Phone: [phone]   Fax:  [phone]  Name: Regina Houston MRN: [id number] Date of Birth: May 18, 1951

## 2016-03-19 ENCOUNTER — Other Ambulatory Visit: Payer: Self-pay | Admitting: Sports Medicine

## 2016-03-24 ENCOUNTER — Encounter: Payer: Medicare Other | Admitting: Physical Therapy

## 2016-03-24 ENCOUNTER — Other Ambulatory Visit: Payer: Self-pay | Admitting: Family Medicine

## 2016-03-26 ENCOUNTER — Ambulatory Visit (INDEPENDENT_AMBULATORY_CARE_PROVIDER_SITE_OTHER): Payer: Medicare Other | Admitting: Rehabilitative and Restorative Service Providers"

## 2016-03-26 ENCOUNTER — Encounter: Payer: Self-pay | Admitting: Sports Medicine

## 2016-03-26 ENCOUNTER — Ambulatory Visit (INDEPENDENT_AMBULATORY_CARE_PROVIDER_SITE_OTHER): Payer: Medicare Other | Admitting: Sports Medicine

## 2016-03-26 ENCOUNTER — Encounter: Payer: Self-pay | Admitting: Rehabilitative and Restorative Service Providers"

## 2016-03-26 DIAGNOSIS — M4726 Other spondylosis with radiculopathy, lumbar region: Secondary | ICD-10-CM | POA: Diagnosis not present

## 2016-03-26 DIAGNOSIS — M6281 Muscle weakness (generalized): Secondary | ICD-10-CM

## 2016-03-26 DIAGNOSIS — M5441 Lumbago with sciatica, right side: Secondary | ICD-10-CM

## 2016-03-26 NOTE — Therapy (Signed)
[place], [place], [address] Phone: [phone]   Fax:  [phone]  Physical Therapy Treatment  Patient Details  Name: Regina Houston MRN: [id number] Date of Birth: 1951/03/30 Referring Provider: Dr. Georgina Snell   Encounter Date: 03/26/2016      PT End of Session - 03/26/16 1020    Visit Number 6   Number of Visits 12   Date for PT Re-Evaluation 04/15/16   PT Start Time 1016   PT Stop Time 1112   PT Time Calculation (min) 56 min   Activity Tolerance Patient tolerated treatment well      Past Medical History  Diagnosis Date  . Diverticulitis   . Diabetes mellitus 2007  . Hyperlipidemia   . Obesity     Past Surgical History  Procedure Laterality Date  . Abdominal hysterectomy  2008    fibroids  . 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 Foot surgery  2004    hammer toe  . Biopsy breast  2007    benign calcification    There were no vitals filed for this visit.      Subjective Assessment - 03/26/16 1020    Subjective Patient reports that her symptoms continue. She had a very long day at work yesterday and was tired with some increase in pain level. She saw Dr. Darene Lamer this am and will proceed with another epidural before referral to a neurosurgeon.    Currently in Pain? Yes   Pain Score 4    Pain Location Back   Pain Orientation Right   Pain Descriptors / Indicators Sharp   Pain Type Acute pain   Pain Onset More than a month ago                         [facility] Adult PT Treatment/Exercise - 03/26/16 0001    Lumbar Exercises: Stretches   Passive Hamstring Stretch 2 reps;30 seconds   Prone on Elbows Stretch 2 reps;20 seconds   Press Ups 5 reps   Lumbar Exercises: Aerobic   Stationary Bike NuStep L5: 5 min   Lumbar Exercises: Supine   Ab Set --  3 part core 10 sec x 10   Bent Knee Raise 10 reps  with core engaged    Bridge 10 reps   Lumbar Exercises: Prone    Other Prone Lumbar Exercises press ups x 10   Other Prone Lumbar Exercises glut set 10 sec x 10    Modalities   Modalities Electrical Stimulation;Moist Heat   Moist Heat Therapy   Number Minutes Moist Heat 15 Minutes   Moist Heat Location Lumbar Spine;Hip   Electrical Stimulation   Electrical Stimulation Location Rt lumbar paraspinals, Rt hip    Electrical Stimulation Action IFC   Electrical Stimulation Parameters to tolerance   Electrical Stimulation Goals Pain   Ultrasound   Ultrasound Location Rt gluts/piriformis    Ultrasound Parameters 63mHz; 1.5 w/cm2; 100%; 8 min    Ultrasound Goals Pain;Other (Comment)  tightness    Manual Therapy   Manual Therapy Joint mobilization   Manual therapy comments joint mobs provided by supervising PT, Celyn Holt.    Joint Mobilization grade III+ to L1-5 CPA and bilat UPA mobs    Soft tissue mobilization deep tissue along lateral border of Rt sacrum                 PT Education - 03/26/16  1035    Education provided Yes   Education Details HEP    Person(s) Educated Patient   Methods Explanation;Demonstration;Tactile cues;Verbal cues;Handout   Comprehension Verbalized understanding;Returned demonstration;Verbal cues required;Tactile cues required             PT Long Term Goals - 03/26/16 1039    PT LONG TERM GOAL #1   Title I with HEP ( 04/14/16)    Time 6   Period Weeks   Status On-going   PT LONG TERM GOAL #2   Title report overall pain decrease =/> 75% with daily activity ( 04/14/16)    Time 6   Period Weeks   Status On-going   PT LONG TERM GOAL #3   Title increase strength Rt hip =/> 5-/5 to support body with standing ( 04/14/16)    Time 6   Period Weeks   Status On-going   PT LONG TERM GOAL #4   Title improve FOTO =/< 34% limited, CJ level ( 04/14/16)    Time 6   Period Weeks   Status On-going   PT LONG TERM GOAL #5   Title sleep per her previous level and not wake due to low back pain ( 04/14/16)    Time 6    Period Weeks   Status On-going               Plan - 03/26/16 1039    Clinical Impression Statement Continued pain and discomfort. Will have another epidural injection before consulting with a neurosurgeon. Added 3 part core and additional exercises without difficulty. Note less palpable tightness through the hip and LB area. Some progress.   Rehab Potential Good   PT Frequency 2x / week   PT Duration 6 weeks   PT Treatment/Interventions Manual techniques;Therapeutic exercise;Moist Heat;Taping;Dry needling;Cryotherapy;Electrical Stimulation;Functional mobility training;Patient/family education;Neuromuscular re-education;Ultrasound;Traction   PT Next Visit Plan Trial traction next visit. Continue core strengthening/ LE stretches.    PT Home Exercise Plan HEP    Consulted and Agree with Plan of Care Patient      Patient will benefit from skilled therapeutic intervention in order to improve the following deficits and impairments:  Postural dysfunction, Decreased strength, Hypomobility, Pain, Increased muscle spasms  Visit Diagnosis: Right-sided low back pain with right-sided sciatica  Muscle weakness (generalized)     Problem List Patient Active Problem List   Diagnosis Date Noted  . Left foot pain 01/10/2015  . Severe obesity (BMI >= 40) ([place]) 08/28/2014  . Cataract 08/12/2014  . Unspecified hypothyroidism 05/28/2014  . Lumbar spondylosis 02/26/2014  . Adult hypothyroidism 02/01/2014  . HLD (hyperlipidemia) 02/01/2014  . Essential hypertension 09/20/2011  . Osteoarthritis of both knees 09/20/2011  . MORBID OBESITY 04/22/2010  . POSTMENOPAUSAL STATUS 04/22/2010  . TROCHANTERIC BURSITIS, RIGHT 12/03/2009  . Goiter 04/01/2009  . Diabetes mellitus ([place]) 04/01/2009    Celyn Nilda Simmer PT, MPH  03/26/2016, 10:59 AM  [facility] [place], [place], [address] Phone: [phone]   Fax:  [phone]  Name:  Regina Houston MRN: [id number] Date of Birth: Nov 01, 1951

## 2016-03-26 NOTE — Patient Instructions (Signed)
Abdominal Bracing With Pelvic Floor (Hook-Lying)    With neutral spine, tighten pelvic floor and abdominals sucking belly button to back bone; tighten muscles in the back at waist. Hold 10 sec  Repeat _10__ times. Do _several __ times a day. Progress to do this in sitting; standing; walking and with functional activities    Small Ball Marching Supine    Lie on back flat, knees flexed. Raise one leg a few inches. Hold 2-3 seconds, return and raise other leg. Keep hips stationary. 10 reps Do _1-2__ sets   Gluteal Sets    Tighten buttocks while pressing pelvis to floor. Hold 10____ seconds. Repeat __10__ times per set. Do _1-2___ sets per session. Do __2__ sessions per day.

## 2016-03-26 NOTE — Progress Notes (Signed)
  Subjective:    CC: follow-up  HPI: This is a very pleasant 66-67-year-old female with lumbar spondylosis, pain is multifactorial, initially she had a fantastic response to a right L5-S1 facet injection, this responded again to a radio frequency ablation with good continued relief of her pain from 2015. She did continue to have some radicular pain which initially responded well to a right L4-L5 transforaminal epidural, this pain recurred, and her second epidural at the same level did not provide much response, she does have some central canal stenosis at L4-L5 and lesser so at L3-L4. She has done several months of formal physical therapy, and continues to have pain despite oral medications and interventional treatment. She is agreeable to consider a surgical option. Currently she does have right-sided radicular symptoms running in what she describes like an L5 versus S1 distribution, into the lateral aspect of her foot as well as the top and bottom of her foot.  Past medical history, Surgical history, Family history not pertinant except as noted below, Social history, Allergies, and medications have been entered into the medical record, reviewed, and no changes needed.   Review of Systems: No fevers, chills, night sweats, weight loss, chest pain, or shortness of breath.   Objective:    General: Well Developed, well nourished, and in no acute distress.  Neuro: Alert and oriented x3, extra-ocular muscles intact, sensation grossly intact.  HEENT: Normocephalic, atraumatic, pupils equal round reactive to light, neck supple, no masses, no lymphadenopathy, thyroid nonpalpable.  Skin: Warm and dry, no rashes. Cardiac: Regular rate and rhythm, no murmurs rubs or gallops, no lower extremity edema.  Respiratory: Clear to auscultation bilaterally. Not using accessory muscles, speaking in full sentences.  Impression and Recommendations:    I spent 25 minutes with this patient, greater than 50% was  face-to-face time counseling regarding the above diagnoses

## 2016-03-26 NOTE — Assessment & Plan Note (Signed)
Multiple interventions so far and several months of formal physical therapy. Facet mediated pain continues to do well after a right L5-S1 facet radio frequency ablation, she also has some right-sided radicular symptoms that initially responded well to a selective L4 nerve root block, her most recent selective L4 nerve root block did not provide similar relief. 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 We are going to proceed with a right-sided L5-S1 interlaminar epidural, and referral to [place] neurosurgery for consideration of decompression.

## 2016-03-31 ENCOUNTER — Other Ambulatory Visit: Payer: Self-pay | Admitting: Radiology

## 2016-03-31 ENCOUNTER — Ambulatory Visit (INDEPENDENT_AMBULATORY_CARE_PROVIDER_SITE_OTHER): Payer: Medicare Other | Admitting: Physical Therapy

## 2016-03-31 ENCOUNTER — Encounter: Payer: Self-pay | Admitting: Radiology

## 2016-03-31 DIAGNOSIS — R252 Cramp and spasm: Secondary | ICD-10-CM

## 2016-03-31 DIAGNOSIS — M5441 Lumbago with sciatica, right side: Secondary | ICD-10-CM | POA: Diagnosis not present

## 2016-03-31 DIAGNOSIS — M6281 Muscle weakness (generalized): Secondary | ICD-10-CM | POA: Diagnosis not present

## 2016-03-31 NOTE — Therapy (Signed)
[place], [place], [address] Phone: [phone]   Fax:  [phone]  Physical Therapy Treatment  Patient Details  Name: Regina Houston MRN: [id number] Date of Birth: [birth year] Referring Provider: Dr. Georgina Snell  Encounter Date: 03/31/2016      PT End of Session - 03/31/16 0938    Visit Number 7   Number of Visits 12   Date for PT Re-Evaluation 04/15/16   PT Start Time 0932      Past Medical History  Diagnosis Date  . Diverticulitis   . Diabetes mellitus 2007  . Hyperlipidemia   . Obesity     Past Surgical History  Procedure Laterality Date  . Abdominal hysterectomy  2008    fibroids  . 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 Foot surgery  2004    hammer toe  . Biopsy breast  2007    benign calcification    There were no vitals filed for this visit.      Subjective Assessment - 03/31/16 0938    Subjective Pt reports no real change since last visit.  She is having 2nd epidural tomorrow.   She reports the relief from the ultrasound to hip lasted only about an hour.    Patient Stated Goals decrease pain, sleep at her previous level , cut her grass   Currently in Pain? Yes   Pain Score 4    Pain Location Back   Pain Orientation Right   Pain Descriptors / Indicators Sharp   Aggravating Factors  bending over, morning time    Pain Relieving Factors stretching and walking             [facility] PT Assessment - 03/31/16 0001    Assessment   Medical Diagnosis OA lumbar spine with radiculapthy   Referring Provider Dr. Georgina Snell   Onset Date/Surgical Date 02/26/16           [place] Adult PT Treatment/Exercise - 03/31/16 0001    Lumbar Exercises: Stretches   Passive Hamstring Stretch 2 reps;60 seconds   Single Knee to Chest Stretch 2 reps;20 seconds   Standing Extension 5 reps   Press Ups 5 reps   Piriformis Stretch 2 reps;20 seconds   Modalities   Modalities Traction    Moist Heat Therapy   Number Minutes Moist Heat 15 Minutes   Moist Heat Location Lumbar Spine;Hip   Traction   Type of Traction Lumbar  pt weighs 246, pt in prone   Min (lbs) 50   Max (lbs) 60   Hold Time 60   Rest Time 20   Time 15     Estim (in IFC) applied to low back paraspinals with MHP after traction complete. For pain relief.            PT Education - 03/31/16 0959    Education provided Yes   Education Details Traction    Person(s) Educated Patient   Methods Explanation   Comprehension Verbalized understanding             PT Long Term Goals - 03/26/16 1039    PT LONG TERM GOAL #1   Title I with HEP ( 04/14/16)    Time 6   Period Weeks   Status On-going   PT LONG TERM GOAL #2   Title report overall pain decrease =/> 75% with daily activity ( 04/14/16)    Time 6   Period Weeks   Status On-going  PT LONG TERM GOAL #3   Title increase strength Rt hip =/> 5-/5 to support body with standing ( 04/14/16)    Time 6   Period Weeks   Status On-going   PT LONG TERM GOAL #4   Title improve FOTO =/< 34% limited, CJ level ( 04/14/16)    Time 6   Period Weeks   Status On-going   PT LONG TERM GOAL #5   Title sleep per her previous level and not wake due to low back pain ( 04/14/16)    Time 6   Period Weeks   Status On-going               Plan - 03/31/16 1001    Clinical Impression Statement Pt reports reduction of symptoms with stretches.  Ultrasound and estim have provided 1-2 hours of relief after treatment.  Pt making gradual progress towards goals.    Rehab Potential Good   PT Frequency 2x / week   PT Duration 6 weeks   PT Treatment/Interventions Manual techniques;Therapeutic exercise;Moist Heat;Taping;Dry needling;Cryotherapy;Electrical Stimulation;Functional mobility training;Patient/family education;Neuromuscular re-education;Ultrasound;Traction   PT Next Visit Plan Assess response to traction.  Continue core strengthening/  LE stretches.        Patient will benefit from skilled therapeutic intervention in order to improve the following deficits and impairments:  Postural dysfunction, Decreased strength, Hypomobility, Pain, Increased muscle spasms  Visit Diagnosis: Right-sided low back pain with right-sided sciatica  Muscle weakness (generalized)  Cramp and spasm     Problem List Patient Active Problem List   Diagnosis Date Noted  . Left foot pain 01/10/2015  . Severe obesity (BMI >= 40) ([place]) 08/28/2014  . Cataract 08/12/2014  . Unspecified hypothyroidism 05/28/2014  . Lumbar spondylosis 02/26/2014  . Adult hypothyroidism 02/01/2014  . HLD (hyperlipidemia) 02/01/2014  . Essential hypertension 09/20/2011  . Osteoarthritis of both knees 09/20/2011  . MORBID OBESITY 04/22/2010  . POSTMENOPAUSAL STATUS 04/22/2010  . TROCHANTERIC BURSITIS, RIGHT 12/03/2009  . Goiter 04/01/2009  . Diabetes mellitus ([place]) 04/01/2009   Kerin Perna, PTA 03/31/2016 10:04 AM  [place], [place], [address] Phone: [phone]   Fax:  [phone]  Name: Regina Houston MRN: [id number] Date of Birth: August 20, 1951

## 2016-04-01 ENCOUNTER — Ambulatory Visit
Admission: RE | Admit: 2016-04-01 | Discharge: 2016-04-01 | Disposition: A | Discharge status: Home / Self Care | Payer: No Typology Code available for payment source | Source: Ambulatory Visit | Attending: Sports Medicine | Admitting: Sports Medicine

## 2016-04-01 DIAGNOSIS — M5136 Other intervertebral disc degeneration, lumbar region: Secondary | ICD-10-CM | POA: Diagnosis not present

## 2016-04-01 MED ORDER — METHYLPREDNISOLONE ACETATE 40 MG/ML INJ SUSP (RADIOLOG
120.0000 mg | Freq: Once | INTRAMUSCULAR | Status: AC
  Administered 2016-04-01: 120 mg via EPIDURAL

## 2016-04-01 MED ORDER — IOPAMIDOL (ISOVUE-M 200) INJECTION 41%
1.0000 mL | Freq: Once | INTRAMUSCULAR | Status: AC
  Administered 2016-04-01: 1 mL via EPIDURAL

## 2016-04-02 ENCOUNTER — Encounter: Payer: Medicare Other | Admitting: Physical Therapy

## 2016-04-06 ENCOUNTER — Ambulatory Visit (INDEPENDENT_AMBULATORY_CARE_PROVIDER_SITE_OTHER): Payer: Medicare Other | Admitting: Physical Therapy

## 2016-04-06 ENCOUNTER — Encounter: Payer: Self-pay | Admitting: Family Medicine

## 2016-04-06 ENCOUNTER — Ambulatory Visit (INDEPENDENT_AMBULATORY_CARE_PROVIDER_SITE_OTHER): Payer: Medicare Other | Admitting: Family Medicine

## 2016-04-06 VITALS — BP 124/64 | HR 65 | Wt 253.0 lb

## 2016-04-06 DIAGNOSIS — R252 Cramp and spasm: Secondary | ICD-10-CM | POA: Diagnosis not present

## 2016-04-06 DIAGNOSIS — R635 Abnormal weight gain: Secondary | ICD-10-CM | POA: Diagnosis not present

## 2016-04-06 DIAGNOSIS — M5441 Lumbago with sciatica, right side: Secondary | ICD-10-CM

## 2016-04-06 DIAGNOSIS — M6281 Muscle weakness (generalized): Secondary | ICD-10-CM

## 2016-04-06 DIAGNOSIS — M4726 Other spondylosis with radiculopathy, lumbar region: Secondary | ICD-10-CM

## 2016-04-06 MED ORDER — TOPIRAMATE 25 MG PO CPSP: 25.0000 mg | ORAL_CAPSULE | Freq: Every day | ORAL | Status: DC

## 2016-04-06 NOTE — Therapy (Signed)
[place], [place], [address] Phone: [phone]   Fax:  [phone]  Physical Therapy Treatment  Patient Details  Name: Regina Houston MRN: [id number] Date of Birth: [birth year] Referring Provider: Dr. Georgina Snell   Encounter Date: 04/06/2016      PT End of Session - 04/06/16 0856    Visit Number 8   Number of Visits 12   Date for PT Re-Evaluation 04/15/16   PT Start Time 0846   PT Stop Time 0935   PT Time Calculation (min) 49 min   Activity Tolerance Patient tolerated treatment well;No increased pain      Past Medical History  Diagnosis Date  . Diverticulitis   . Diabetes mellitus 2007  . Hyperlipidemia   . Obesity     Past Surgical History  Procedure Laterality Date  . Abdominal hysterectomy  2008    fibroids  . 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 Foot surgery  2004    hammer toe  . Biopsy breast  2007    benign calcification    There were no vitals filed for this visit.      Subjective Assessment - 04/06/16 0856    Subjective Pt had injection into L5 space on Thurs and has had good relief siince then.  Has been performing stretches since last visit.  Has stopped phentermine and feels she is sleeping a little better now.  She also feels traction last visit helped.    Pertinent History lumbar stenosis, has lost about 50 pounds recently.    Patient Stated Goals decrease pain, sleep at her previous level , cut her grass   Currently in Pain? Yes   Pain Score 1    Pain Location Back   Pain Orientation Right   Pain Descriptors / Indicators Sharp   Aggravating Factors  bending over   Pain Relieving Factors stretching and walking            [facility] PT Assessment - 04/06/16 0001    Assessment   Medical Diagnosis OA lumbar spine with radiculapthy   Referring Provider Dr. Georgina Snell    Onset Date/Surgical Date 02/26/16   Strength   Right/Left Hip Right;Left   Right Hip  Flexion 4+/5   Right Hip ABduction 4+/5   Left Hip Flexion 5/5          [facility] Adult PT Treatment/Exercise - 04/06/16 0001    Lumbar Exercises: Stretches   Passive Hamstring Stretch 2 reps;60 seconds   Lumbar Exercises: Aerobic   Stationary Bike NuStep L4: 6 min    Lumbar Exercises: Supine   Straight Leg Raise 10 reps   Isometric Hip Flexion 10 reps  ipsilateral    Isometric Hip Flexion Limitations (tightening core when lowering leg to mat)   Lumbar Exercises: Sidelying   Clam 10 reps   Lumbar Exercises: Prone   Opposite Arm/Leg Raise 5 reps;Left arm/Right leg;Right arm/Left leg  2 sets   Other Prone Lumbar Exercises press ups x 10   Modalities   Modalities Electrical Stimulation;Traction   Moist Heat Therapy   Number Minutes Moist Heat 15 Minutes   Moist Heat Location Hip;Lumbar Spine   Electrical Stimulation   Electrical Stimulation Location Rt lumbar paraspinals    Electrical Stimulation Action IFC   Electrical Stimulation Parameters to tolerance    Electrical Stimulation Goals Pain   Traction   Type of Traction Lumbar  pt weighs 246- prone position   Min (  lbs) 55   Max (lbs) 65   Hold Time 60   Rest Time 20   Time 15          PT Long Term Goals - 03/26/16 1039    PT LONG TERM GOAL #1   Title I with HEP ( 04/14/16)    Time 6   Period Weeks   Status On-going   PT LONG TERM GOAL #2   Title report overall pain decrease =/> 75% with daily activity ( 04/14/16)    Time 6   Period Weeks   Status On-going   PT LONG TERM GOAL #3   Title increase strength Rt hip =/> 5-/5 to support body with standing ( 04/14/16)    Time 6   Period Weeks   Status On-going   PT LONG TERM GOAL #4   Title improve FOTO =/< 34% limited, CJ level ( 04/14/16)    Time 6   Period Weeks   Status On-going   PT LONG TERM GOAL #5   Title sleep per her previous level and not wake due to low back pain ( 04/14/16)    Time 6   Period Weeks   Status On-going               Plan -  04/06/16 0927    Clinical Impression Statement Pt continues to have decreased strength in Rt hip flexion; Rt hip abd improved.  Pt reported positive response to lumbar traction; repeated this today with increased pull.  Pt making gradual progress towards goals.    Rehab Potential Good   PT Frequency 2x / week   PT Duration 6 weeks   PT Treatment/Interventions Manual techniques;Therapeutic exercise;Moist Heat;Taping;Dry needling;Cryotherapy;Electrical Stimulation;Functional mobility training;Patient/family education;Neuromuscular re-education;Ultrasound;Traction   PT Next Visit Plan Assess response to increased pull with traction.  Continue core strengthening/  LE stretches.    PT Home Exercise Plan HEP    Consulted and Agree with Plan of Care Patient      Patient will benefit from skilled therapeutic intervention in order to improve the following deficits and impairments:  Postural dysfunction, Decreased strength, Hypomobility, Pain, Increased muscle spasms  Visit Diagnosis: Right-sided low back pain with right-sided sciatica  Muscle weakness (generalized)  Cramp and spasm     Problem List Patient Active Problem List   Diagnosis Date Noted  . Left foot pain 01/10/2015  . Severe obesity (BMI >= 40) ([place]) 08/28/2014  . Cataract 08/12/2014  . Unspecified hypothyroidism 05/28/2014  . Lumbar spondylosis 02/26/2014  . Adult hypothyroidism 02/01/2014  . HLD (hyperlipidemia) 02/01/2014  . Essential hypertension 09/20/2011  . Osteoarthritis of both knees 09/20/2011  . MORBID OBESITY 04/22/2010  . POSTMENOPAUSAL STATUS 04/22/2010  . TROCHANTERIC BURSITIS, RIGHT 12/03/2009  . Goiter 04/01/2009  . Diabetes mellitus ([place]) 04/01/2009   Kerin Perna, PTA 04/06/2016 9:29 AM  [facility] [place], [place], [address] Phone: [phone]   Fax:  [phone]  Name: Regina Houston MRN: [id number] Date of Birth:  02/04/1951

## 2016-04-06 NOTE — Progress Notes (Signed)
Subjective:    Patient ID: Regina Houston, female    DOB: 1951/08/31, 65 y.o.   MRN: [id number]  HPI Patient is here today to discuss weight gain. She was previously on phentermine and was doing well initially but unfortunately she has plateaued on the medication. She has been off of it for almost a month at this point in her weight has at least stabilized at 253 pounds. She was up to 301 pounds so she has lost almost 50 pounds in total starting in December 2015. She wants to know what other options are available especially with her Medicare. She is also currently on Trulicity which has helped but it's quite expensive. She noted she has not been able to exercise because she recently injured her back. She is in formal physical therapy and recently had an injection in the lumbar spine.  She also wants discussed getting her screening colonoscopy. Her last one was about 10 years ago.She got a letter notifying her recently that she is due for repeat and just wants to discuss current options.   Review of Systems BP 124/64 mmHg  Pulse 65  Wt 253 lb (114.76 kg)  SpO2 99%    Allergies  Allergen Reactions  . Latex Rash  . Tape Rash    Past Medical History  Diagnosis Date  . Diverticulitis   . Diabetes mellitus 2007  . Hyperlipidemia   . Obesity     Past Surgical History  Procedure Laterality Date  . Abdominal hysterectomy  2008    fibroids  . 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 Foot surgery  2004    hammer toe  . Biopsy breast  2007    benign calcification    Social History   Social History  . Marital Status: Single    Spouse Name: N/A  . Number of Children: 0  . Years of Education: BA   Occupational History  . 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    Social History Main Topics  . Smoking status: Never Smoker   . Smokeless tobacco: Not on file  . Alcohol Use: No  . Drug Use: No  . Sexual Activity: Not Currently   Other Topics Concern  . Not on file   Social  History Narrative   NO regular exercise.     Family History  Problem Relation Age of Onset  . Hypertension Mother   . Stroke Mother   . Diabetes Mother   . Kidney disease Father     kidney failure  . Heart disease Maternal Grandmother     MI  . Diabetes Maternal Grandmother   . Heart disease Mother 62  . Heart disease Father 42    Outpatient Encounter Prescriptions as of 04/06/2016  Medication Sig  . AMBULATORY NON FORMULARY MEDICATION Knee-high, medium compression, graduated compression stockings. Apply to lower extremities.  . AMBULATORY NON FORMULARY MEDICATION Medication Name: Glucometer.  STrips and lancets to test twice daily.   Dx 250.00.  Marland Kitchen aspirin 81 MG tablet Take 81 mg by mouth daily.    . calcium carbonate (OS-CAL) 600 MG TABS Take 600 mg by mouth 2 (two) times daily with a meal.    . celecoxib (CELEBREX) 200 MG capsule TAKE ONE TO TWO CAPSULES BY MOUTH ONCE DAILY AS NEEDED FOR PAIN  . diclofenac (VOLTAREN) 75 MG EC tablet TAKE ONE TABLET BY MOUTH TWICE DAILY  . fish oil-omega-3 fatty acids 1000 MG capsule Take 2 g  by mouth daily.    . furosemide (LASIX) 20 MG tablet Take 1 tablet (20 mg total) by mouth daily.  Marland Kitchen levothyroxine (SYNTHROID, LEVOTHROID) 100 MCG tablet TAKE ONE TABLET BY MOUTH ONCE DAILY  . lisinopril (PRINIVIL,ZESTRIL) 20 MG tablet TAKE ONE TABLET BY MOUTH ONCE DAILY  . metFORMIN (GLUCOPHAGE) 500 MG tablet TAKE ONE TABLET BY MOUTH TWICE DAILY WITH MEALS  . Multiple Vitamin (THERA) TABS Take 1 tablet by mouth.  . phentermine (ADIPEX-P) 37.5 MG tablet   . simvastatin (ZOCOR) 20 MG tablet TAKE ONE TABLET BY MOUTH AT BEDTIME  . topiramate (TOPAMAX) 25 MG capsule Take 1 capsule (25 mg total) by mouth daily. After 2 weeks can increase to BID  . traMADol (ULTRAM) 50 MG tablet TAKE ONE TO TWO TABLETS BY MOUTH EVERY 8 HOURS AS NEEDED FOR PAIN MAXIMUM  OF  6  TABLETS  PER  DAY  . TRULICITY 1.5 0000000 SOPN INJECT ONE UNIT DOSE SUBCUTANEOUSLY ONCE A WEEK  . Urea  40 % LOTN Apply 1 application topically 2 (two) times daily as needed.   No facility-administered encounter medications on file as of 04/06/2016.          Objective:   Physical Exam  Constitutional: She is oriented to person, place, and time. She appears well-developed and well-nourished.  HENT:  Head: Normocephalic and atraumatic.  Eyes: Conjunctivae and EOM are normal.  Cardiovascular: Normal rate.   Pulmonary/Chest: Effort normal.  Neurological: She is alert and oriented to person, place, and time.  Skin: Skin is dry. No pallor.  Psychiatric: She has a normal mood and affect. Her behavior is normal.  Vitals reviewed.       Assessment & Plan:  Abnormal weight gain/BMI 42. She would really like to lose an additional 50 pounds. We discussed several options. Most of the newer medications are quite extensive especially without the drug coverage and Medicare does not pay for these. We will continue with the Trulicity at least through the end of this month. But she will likely not be able to afford to refill it. We'll continue with the metformin as well. Also discussed adding a low-dose topiramate.  Discussed potential side effects. She would like to try the medication. We'll start 25 mg daily for 2 weeks and then try to increase to twice a day if tolerated. Will follow-up in one month for blood pressure and weight check. She really has done fantastic overall and I want to keep the omentum to going. Continue to work on diet. 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 We could always consider retrying the phentermine again in a few months.  Colon cancer screening-discussed current options including colonoscopy and Cologuard.  Given infor. She will check with her insurance first can call back.   OA  spine - Following with Dr. Darene Lamer and in PT.

## 2016-04-08 ENCOUNTER — Ambulatory Visit (INDEPENDENT_AMBULATORY_CARE_PROVIDER_SITE_OTHER): Payer: No Typology Code available for payment source | Admitting: Physical Therapy

## 2016-04-08 ENCOUNTER — Other Ambulatory Visit: Payer: Self-pay | Admitting: Family Medicine

## 2016-04-08 DIAGNOSIS — M5441 Lumbago with sciatica, right side: Secondary | ICD-10-CM

## 2016-04-08 DIAGNOSIS — M6281 Muscle weakness (generalized): Secondary | ICD-10-CM | POA: Diagnosis not present

## 2016-04-08 DIAGNOSIS — R252 Cramp and spasm: Secondary | ICD-10-CM | POA: Diagnosis not present

## 2016-04-08 NOTE — Therapy (Signed)
[place], [place], [address] Phone: [phone]   Fax:  [phone]  Physical Therapy Treatment  Patient Details  Name: Regina Houston MRN: [id number] Date of Birth: 01-20-1951 Referring Provider: Dr. Georgina Snell  Encounter Date: 04/08/2016      PT End of Session - 04/08/16 1624    Visit Number 9   Number of Visits 12   Date for PT Re-Evaluation 04/15/16   PT Start Time 7342   PT Stop Time 8768   PT Time Calculation (min) 61 min   Activity Tolerance Patient tolerated treatment well;No increased pain      Past Medical History  Diagnosis Date  . Diverticulitis   . Diabetes mellitus 2007  . Hyperlipidemia   . Obesity     Past Surgical History  Procedure Laterality Date  . Abdominal hysterectomy  2008    fibroids  . 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 Foot surgery  2004    hammer toe  . Biopsy breast  2007    benign calcification    There were no vitals filed for this visit.      Subjective Assessment - 04/08/16 1630    Subjective Pt reports she is now sleeping through the night, believes the phentermine had caused some of sleeplessness.  Pt has had very little pain since last visit. For exercises, she is mostly just performing stretches.    Patient Stated Goals decrease pain, sleep at her previous level , cut her grass   Currently in Pain? Yes   Pain Score 1    Pain Location Back   Pain Orientation Right   Pain Descriptors / Indicators [facility] PT Assessment - 04/08/16 0001    Assessment   Medical Diagnosis OA lumbar spine with radiculapthy   Referring Provider Dr. Georgina Snell   Onset Date/Surgical Date 02/26/16           [place] Adult PT Treatment/Exercise - 04/08/16 0001    Lumbar Exercises: Stretches   Passive Hamstring Stretch 2 reps;60 seconds   Press Ups --  10 reps with 2 sec hold   Lumbar Exercises: Aerobic   Stationary Bike NuStep L5:  5.5 min   Lumbar Exercises: Supine   Bridge 10 reps   Straight Leg Raise 5 reps;1 second  core engaged   Isometric Hip Flexion 10 reps  same side, 3 sec hold    Lumbar Exercises: Sidelying   Clam 10 reps  2 sets each side   Lumbar Exercises: Prone   Opposite Arm/Leg Raise 5 reps;Left arm/Right leg;Right arm/Left leg  2 sets   Moist Heat Therapy   Number Minutes Moist Heat 20 Minutes   Moist Heat Location Lumbar Spine;Hip   Electrical Stimulation   Electrical Stimulation Location Rt lumbar paraspinals    Electrical Stimulation Action IFC   Electrical Stimulation Parameters to tolerance   Electrical Stimulation Goals Pain   Traction   Type of Traction Lumbar  pt weighs 246, prone position   Min (lbs) 65   Max (lbs) 72   Hold Time 60   Rest Time 20   Time 20           PT Long Term Goals - 03/26/16 1039    PT LONG TERM GOAL #1   Title I with HEP ( 04/14/16)    Time 6   Period Weeks   Status On-going  PT LONG TERM GOAL #2   Title report overall pain decrease =/> 75% with daily activity ( 04/14/16)    Time 6   Period Weeks   Status On-going   PT LONG TERM GOAL #3   Title increase strength Rt hip =/> 5-/5 to support body with standing ( 04/14/16)    Time 6   Period Weeks   Status On-going   PT LONG TERM GOAL #4   Title improve FOTO =/< 34% limited, CJ level ( 04/14/16)    Time 6   Period Weeks   Status On-going   PT LONG TERM GOAL #5   Title sleep per her previous level and not wake due to low back pain ( 04/14/16)    Time 6   Period Weeks   Status On-going               Plan - 04/08/16 1703    Clinical Impression Statement Pt tolerated increased pull with traction today.  Continues to have positive reponse with traction and estim.  Tolerated all exercises without increase pain.  Has met LTG #5 with improved sleep.    Rehab Potential Good   PT Frequency 2x / week   PT Duration 6 weeks   PT Treatment/Interventions Manual techniques;Therapeutic  exercise;Moist Heat;Taping;Dry needling;Cryotherapy;Electrical Stimulation;Functional mobility training;Patient/family education;Neuromuscular re-education;Ultrasound;Traction   PT Next Visit Plan Assess response to increased pull with traction.  Continue core strengthening/  LE stretches.    Consulted and Agree with Plan of Care Patient      Patient will benefit from skilled therapeutic intervention in order to improve the following deficits and impairments:  Postural dysfunction, Decreased strength, Hypomobility, Pain, Increased muscle spasms  Visit Diagnosis: Right-sided low back pain with right-sided sciatica  Muscle weakness (generalized)  Cramp and spasm     Problem List Patient Active Problem List   Diagnosis Date Noted  . Left foot pain 01/10/2015  . Severe obesity (BMI >= 40) ([place]) 08/28/2014  . Cataract 08/12/2014  . Unspecified hypothyroidism 05/28/2014  . Lumbar spondylosis 02/26/2014  . Adult hypothyroidism 02/01/2014  . HLD (hyperlipidemia) 02/01/2014  . Essential hypertension 09/20/2011  . Osteoarthritis of both knees 09/20/2011  . MORBID OBESITY 04/22/2010  . POSTMENOPAUSAL STATUS 04/22/2010  . TROCHANTERIC BURSITIS, RIGHT 12/03/2009  . Goiter 04/01/2009  . Diabetes mellitus ([place]) 04/01/2009   Kerin Perna, PTA 04/08/2016 5:10 PM    [place], [place], [address] Phone: [phone]   Fax:  [phone]  Name: Regina Houston MRN: [id number] Date of Birth: 1951/01/13

## 2016-04-13 ENCOUNTER — Ambulatory Visit (INDEPENDENT_AMBULATORY_CARE_PROVIDER_SITE_OTHER): Payer: Medicare Other | Admitting: Physical Therapy

## 2016-04-13 DIAGNOSIS — M545 Low back pain: Secondary | ICD-10-CM | POA: Diagnosis not present

## 2016-04-13 DIAGNOSIS — M5441 Lumbago with sciatica, right side: Secondary | ICD-10-CM | POA: Diagnosis not present

## 2016-04-13 DIAGNOSIS — M6281 Muscle weakness (generalized): Secondary | ICD-10-CM

## 2016-04-13 DIAGNOSIS — R252 Cramp and spasm: Secondary | ICD-10-CM | POA: Diagnosis not present

## 2016-04-13 DIAGNOSIS — Z6841 Body Mass Index (BMI) 40.0 and over, adult: Secondary | ICD-10-CM | POA: Diagnosis not present

## 2016-04-13 NOTE — Therapy (Signed)
[place], [place], [address] Phone: [phone]   Fax:  [phone]  Physical Therapy Treatment  Patient Details  Name: Regina Houston MRN: [id number] Date of Birth: 24-Jan-1951 Referring Provider: Dr. Georgina Snell   Encounter Date: 04/13/2016      PT End of Session - 04/13/16 0935    Visit Number 10   Number of Visits 18   Date for PT Re-Evaluation 05/11/16   PT Start Time 0931   PT Stop Time 1034   PT Time Calculation (min) 63 min   Activity Tolerance Patient tolerated treatment well  some radicular symptoms during ther ex.       Past Medical History  Diagnosis Date  . Diverticulitis   . Diabetes mellitus 2007  . Hyperlipidemia   . Obesity     Past Surgical History  Procedure Laterality Date  . Abdominal hysterectomy  2008    fibroids  . 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 Foot surgery  2004    hammer toe  . Biopsy breast  2007    benign calcification    There were no vitals filed for this visit.      Subjective Assessment - 04/13/16 0935    Subjective Pt reports she was a little achy the evening and next day after last treatment. Pt has mowed her grass, and didn't have any discomfort with this, but she split job into 2 days instead of 1.    Pertinent History lumbar stenosis, has lost about 50 pounds recently.    Patient Stated Goals decrease pain, sleep at her previous level , cut her grass   Currently in Pain? No/denies            [facility] PT Assessment - 04/13/16 0001    Assessment   Medical Diagnosis OA lumbar spine with radiculapthy   Referring Provider Dr. Georgina Snell    Onset Date/Surgical Date 02/26/16   Observation/Other Assessments   Focus on Therapeutic Outcomes (FOTO)  32% limited.  (goal 34% limited. )   Strength   Right/Left Hip Right;Left   Right Hip Flexion --  5-/5   Right Hip Extension 4-/5   Right Hip ABduction 4+/5   Left Hip Flexion 5/5    Left Hip Extension 5/5   Left Hip ABduction 5/5          [facility] Adult PT Treatment/Exercise - 04/13/16 0001    Lumbar Exercises: Stretches   Passive Hamstring Stretch 2 reps;60 seconds   Press Ups --  10 reps with 2 sec hold   Lumbar Exercises: Standing   Other Standing Lumbar Exercises Hip abd RLE x 10, hip ext RLE x 10 pt reported some tingling in Rt foot with this activity.    Lumbar Exercises: Supine   Isometric Hip Flexion 10 reps  same side, 3 sec hold    Isometric Hip Flexion Limitations (tightening core when lowering leg to mat)   Lumbar Exercises: Sidelying   Clam 10 reps  2 sets, Rt side only   Lumbar Exercises: Prone   Opposite Arm/Leg Raise 5 reps;Left arm/Right leg;Right arm/Left leg  2 sets   Opposite Arm/Leg Raise Limitations some cramping in Rt low back at end of second set.    Moist Heat Therapy   Number Minutes Moist Heat 20 Minutes   Moist Heat Location Lumbar Spine   Electrical Stimulation   Electrical Stimulation Location Rt lumbar paraspinals    Electrical Stimulation Action  IFC   Electrical Stimulation Parameters to tolerance    Electrical Stimulation Goals Pain   Traction   Min (lbs) 55   Max (lbs) 65   Hold Time 60   Rest Time 20   Time 20                     PT Long Term Goals - 04/13/16 0939    PT LONG TERM GOAL #1   Title I with HEP ( 05/11/16)    Time 4   Period Weeks   Status On-going   PT LONG TERM GOAL #2   Title report overall pain decrease =/> 75% with daily activity ( 05/11/16)    Time 4   Period Weeks   Status On-going  pt reports 50-60% decrease on 04/13/16   PT LONG TERM GOAL #3   Title increase strength Rt hip =/> 5-/5 to support body with standing ( 05/11/16)    Time 4   Period Weeks   Status On-going   PT LONG TERM GOAL #4   Title improve FOTO =/< 34% limited, CJ level ( 04/14/16)    Time 6   Period Weeks   Status Achieved   PT LONG TERM GOAL #5   Title sleep per her previous level and not wake due to low  back pain ( 04/14/16)    Time 6   Period Weeks   Status Achieved               Plan - 04/13/16 0953    Clinical Impression Statement Pt had some cramping in Rt low back with prone opposite arm/ leg. Pt demonstrated improved Rt hip flexion strength, continues with weakness in Rt hip extensors. Pt reported some tingling in foot with standing hip extension; reduced with hamstring stretch.  Traction pull reduced to previous sessions due to pt report of soreness with last session.  Pt has met LTG #4, partially met her goals and will benefit from continued PT intervention to maximize functional mobility.  Renewal written and sent to referring MD.  She has an appointment with Neuro tomorrow    Rehab Potential Good   PT Frequency 2x / week   PT Duration 6 weeks   PT Treatment/Interventions Manual techniques;Therapeutic exercise;Moist Heat;Taping;Dry needling;Cryotherapy;Electrical Stimulation;Functional mobility training;Patient/family education;Neuromuscular re-education;Ultrasound;Traction   PT Next Visit Plan  Continue core strengthening/  LE stretches. Assess response to traction pull .   Consulted and Agree with Plan of Care Patient      Patient will benefit from skilled therapeutic intervention in order to improve the following deficits and impairments:  Postural dysfunction, Decreased strength, Hypomobility, Pain, Increased muscle spasms  Visit Diagnosis: Right-sided low back pain with right-sided sciatica  Muscle weakness (generalized)  Cramp and spasm     Problem List Patient Active Problem List   Diagnosis Date Noted  . Left foot pain 01/10/2015  . Severe obesity (BMI >= 40) ([place]) 08/28/2014  . Cataract 08/12/2014  . Unspecified hypothyroidism 05/28/2014  . Lumbar spondylosis 02/26/2014  . Adult hypothyroidism 02/01/2014  . HLD (hyperlipidemia) 02/01/2014  . Essential hypertension 09/20/2011  . Osteoarthritis of both knees 09/20/2011  . MORBID OBESITY 04/22/2010  .  POSTMENOPAUSAL STATUS 04/22/2010  . TROCHANTERIC BURSITIS, RIGHT 12/03/2009  . Goiter 04/01/2009  . Diabetes mellitus ([place]) 04/01/2009   Kerin Perna, PTA 04/13/2016 10:38 AM   Jeral Pinch, PT 04/13/2016 10:38 AM   [facility] [place], [place], [address]  Phone: [phone]   Fax:  [phone]  Name: Sonja Manseau MRN: [id number] Date of Birth: 14-Oct-1951

## 2016-04-14 ENCOUNTER — Telehealth: Payer: Self-pay

## 2016-04-14 NOTE — Telephone Encounter (Signed)
Pt left VM stating she does not need surgery per Dr. Megan Salon and is wondering if she needs to complete PT. Please advise.

## 2016-04-14 NOTE — Telephone Encounter (Signed)
According to Dr. Lacy Duverney notes she was doing well, if so then she does not need to, if she still has some symptoms then yes physical therapy is always beneficial.

## 2016-04-15 ENCOUNTER — Ambulatory Visit (INDEPENDENT_AMBULATORY_CARE_PROVIDER_SITE_OTHER): Payer: No Typology Code available for payment source | Admitting: Rehabilitative and Restorative Service Providers"

## 2016-04-15 ENCOUNTER — Encounter: Payer: Self-pay | Admitting: Rehabilitative and Restorative Service Providers"

## 2016-04-15 DIAGNOSIS — M5441 Lumbago with sciatica, right side: Secondary | ICD-10-CM | POA: Diagnosis present

## 2016-04-15 DIAGNOSIS — M6281 Muscle weakness (generalized): Secondary | ICD-10-CM

## 2016-04-15 NOTE — Telephone Encounter (Signed)
Thanks Kel.

## 2016-04-15 NOTE — Therapy (Signed)
[place], [place], [address] Phone: [phone]   Fax:  [phone]  Physical Therapy Treatment  Patient Details  Name: Regina Houston MRN: [id number] Date of Birth: [birth year] Referring Provider: Dr. Georgina Snell   Encounter Date: 04/15/2016      PT End of Session - 04/15/16 1021    Visit Number 11   Number of Visits 18   Date for PT Re-Evaluation 05/11/16   PT Start Time 1018   PT Stop Time 1112   PT Time Calculation (min) 54 min   Activity Tolerance Patient tolerated treatment well      Past Medical History  Diagnosis Date  . Diverticulitis   . Diabetes mellitus 2007  . Hyperlipidemia   . Obesity     Past Surgical History  Procedure Laterality Date  . Abdominal hysterectomy  2008    fibroids  . 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 Foot surgery  2004    hammer toe  . Biopsy breast  2007    benign calcification    There were no vitals filed for this visit.      Subjective Assessment - 04/15/16 1019    Subjective Patient checked with Dr T and he said to continue with PT for now. Tasnim reports that she continues to improve. She has less pain and has increased her activity level.    Currently in Pain? Yes   Pain Score 2    Pain Location Back   Pain Orientation Right   Pain Descriptors / Indicators Sharp   Pain Type Acute pain   Pain Radiating Towards Rt LE - "crawly feeling"    Pain Frequency Constant                         [facility] Adult PT Treatment/Exercise - 04/15/16 0001    Lumbar Exercises: Stretches   Passive Hamstring Stretch 2 reps;60 seconds   Passive Hamstring Stretch Limitations strap   Press Ups --  10 reps with 2 sec hold   Piriformis Stretch 2 reps;20 seconds   Lumbar Exercises: Aerobic   Stationary Bike NuStep L5: 35min   Lumbar Exercises: Standing   Other Standing Lumbar Exercises Hip abd RLE x 10, hip ext RLE x 10 pt reported  some tingling in Rt foot with this activity.    Modalities   Modalities Electrical Stimulation;Moist Heat;Traction   Moist Heat Therapy   Number Minutes Moist Heat 20 Minutes   Moist Heat Location Lumbar Spine   Electrical Stimulation   Electrical Stimulation Location Rt lumbar paraspinals    Electrical Stimulation Action IFC   Electrical Stimulation Parameters to tolerance   Electrical Stimulation Goals Pain   Traction   Min (lbs) 60   Max (lbs) 68   Hold Time 60   Rest Time 20   Time 20   Manual Therapy   Manual Therapy Joint mobilization   Joint Mobilization grade III+ to L1-5 CPA and bilat UPA mobs                      PT Long Term Goals - 04/13/16 0939    PT LONG TERM GOAL #1   Title I with HEP ( 05/11/16)    Time 4   Period Weeks   Status On-going   PT LONG TERM GOAL #2   Title report overall pain decrease =/> 75% with daily activity (  05/11/16)    Time 4   Period Weeks   Status On-going  pt reports 50-60% decrease on 04/13/16   PT LONG TERM GOAL #3   Title increase strength Rt hip =/> 5-/5 to support body with standing ( 05/11/16)    Time 4   Period Weeks   Status On-going   PT LONG TERM GOAL #4   Title improve FOTO =/< 34% limited, CJ level ( 04/14/16)    Time 6   Period Weeks   Status Achieved   PT LONG TERM GOAL #5   Title sleep per her previous level and not wake due to low back pain ( 04/14/16)    Time 6   Period Weeks   Status Achieved               Plan - 04/15/16 1022    Clinical Impression Statement Continued tingling into the Rt LE which seems to be persistent. She was seen by neurologist who did not feel she needed to see him. Will continue with core stabilization and traction/modalities as indicated   Rehab Potential Good   PT Frequency 2x / week   PT Duration 6 weeks   PT Treatment/Interventions Manual techniques;Therapeutic exercise;Moist Heat;Taping;Dry needling;Cryotherapy;Electrical Stimulation;Functional mobility  training;Patient/family education;Neuromuscular re-education;Ultrasound;Traction   PT Next Visit Plan  Continue core strengthening/  LE stretches. Assess response to traction pull .   PT Home Exercise Plan HEP    Consulted and Agree with Plan of Care Patient      Patient will benefit from skilled therapeutic intervention in order to improve the following deficits and impairments:  Postural dysfunction, Decreased strength, Hypomobility, Pain, Increased muscle spasms  Visit Diagnosis: Right-sided low back pain with right-sided sciatica  Muscle weakness (generalized)     Problem List Patient Active Problem List   Diagnosis Date Noted  . Left foot pain 01/10/2015  . Severe obesity (BMI >= 40) ([place]) 08/28/2014  . Cataract 08/12/2014  . Unspecified hypothyroidism 05/28/2014  . Lumbar spondylosis 02/26/2014  . Adult hypothyroidism 02/01/2014  . HLD (hyperlipidemia) 02/01/2014  . Essential hypertension 09/20/2011  . Osteoarthritis of both knees 09/20/2011  . MORBID OBESITY 04/22/2010  . POSTMENOPAUSAL STATUS 04/22/2010  . TROCHANTERIC BURSITIS, RIGHT 12/03/2009  . Goiter 04/01/2009  . Diabetes mellitus ([place]) 04/01/2009    Tea Collums Nilda Simmer PT, MPH  04/15/2016, 10:51 AM  [facility], [place], [address] Phone: [phone]   Fax:  [phone]  Name: Aleshanee Renard MRN: [id number] Date of Birth: 1951/05/29

## 2016-04-15 NOTE — Telephone Encounter (Signed)
Spoke with Pt, states she would like to continue PT. She currently goes to the therapy in our building. Pt does not need a new referral, she just wanted PCP recommendation if she should continue.

## 2016-04-20 ENCOUNTER — Ambulatory Visit (INDEPENDENT_AMBULATORY_CARE_PROVIDER_SITE_OTHER): Payer: No Typology Code available for payment source | Admitting: Physical Therapy

## 2016-04-20 ENCOUNTER — Encounter: Payer: Self-pay | Admitting: Physical Therapy

## 2016-04-20 DIAGNOSIS — M6281 Muscle weakness (generalized): Secondary | ICD-10-CM

## 2016-04-20 DIAGNOSIS — R252 Cramp and spasm: Secondary | ICD-10-CM | POA: Diagnosis not present

## 2016-04-20 DIAGNOSIS — M5441 Lumbago with sciatica, right side: Secondary | ICD-10-CM

## 2016-04-20 NOTE — Therapy (Signed)
[place], [place], [address] Phone: [phone]   Fax:  [phone]  Physical Therapy Treatment  Patient Details  Name: Regina Houston MRN: [id number] Date of Birth: 1950/12/17 Referring Provider: Dr Georgina Snell  Encounter Date: 04/20/2016      PT End of Session - 04/20/16 0917    Visit Number 12   Number of Visits 18   Date for PT Re-Evaluation 05/11/16   PT Start Time 0849   PT Stop Time 0947   PT Time Calculation (min) 58 min   Activity Tolerance Patient tolerated treatment well      Past Medical History  Diagnosis Date  . Diverticulitis   . Diabetes mellitus 2007  . Hyperlipidemia   . Obesity     Past Surgical History  Procedure Laterality Date  . Abdominal hysterectomy  2008    fibroids  . 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 Foot surgery  2004    hammer toe  . Biopsy breast  2007    benign calcification    There were no vitals filed for this visit.      Subjective Assessment - 04/20/16 0851    Subjective Vergene reports today is a pretty good day so far   Patient Stated Goals decrease pain, sleep at her previous level , cut her grass   Currently in Pain? Yes   Pain Score 2    Pain Location Back   Pain Orientation Right   Pain Descriptors / Indicators Aching;Sharp   Pain Type Acute pain   Pain Radiating Towards into Rt LE   Pain Onset More than a month ago   Pain Frequency Constant   Aggravating Factors  forward bending   Pain Relieving Factors walking and exercise            [facility] PT Assessment - 04/20/16 0001    Assessment   Medical Diagnosis OA lumbar spine with radiculapthy   Referring Provider Dr Georgina Snell   Onset Date/Surgical Date 02/26/16   Strength   Strength Assessment Site Hip   Right/Left Hip Right   Right Hip Flexion 5/5   Right Hip Extension 4+/5   Right Hip ABduction --  5-/5                     [facility] Adult PT  Treatment/Exercise - 04/20/16 0001    Lumbar Exercises: Stretches   Active Hamstring Stretch Limitations 30 sec gastroc stetch at wall   Lumbar Exercises: Aerobic   Stationary Bike bike L2 x5'   Lumbar Exercises: Standing   Heel Raises --  2x8 off edge of step with core bracing.    Wall Slides --  2x8, FWD reach with 3#   Lumbar Exercises: Supine   Bridge 10 reps   Lumbar Exercises: Sidelying   Hip Abduction 20 reps   Lumbar Exercises: Prone   Other Prone Lumbar Exercises 8 reps lower body lifts, developed pain.    Modalities   Modalities Electrical Stimulation;Moist Heat;Traction   Moist Heat Therapy   Number Minutes Moist Heat 20 Minutes   Moist Heat Location Lumbar Spine   Electrical Stimulation   Electrical Stimulation Location Rt lumbar paraspinals    Electrical Stimulation Action IFC   Electrical Stimulation Parameters to tolerance   Electrical Stimulation Goals Pain   Traction   Type of Traction Lumbar  prone   Min (lbs) 65   Max (lbs) 75   Hold  Time 60   Rest Time 20   Time 20   Manual Therapy   Soft tissue mobilization Rt gluts, after spasm with ther ex                     PT Long Term Goals - 04/20/16 5830    PT LONG TERM GOAL #1   Title I with HEP ( 05/11/16)    Time 4   Period Weeks   Status On-going   PT LONG TERM GOAL #2   Title report overall pain decrease =/> 75% with daily activity ( 05/11/16)    Time 4   Period Weeks   Status On-going   PT LONG TERM GOAL #3   Title increase strength Rt hip =/> 5-/5 to support body with standing ( 05/11/16)    Status Partially Met   PT LONG TERM GOAL #4   Title improve FOTO =/< 34% limited, CJ level ( 04/14/16)    Status Achieved   PT LONG TERM GOAL #5   Title sleep per her previous level and not wake due to low back pain ( 04/14/16)    Status Achieved               Plan - 04/20/16 0927    Clinical Impression Statement Deondria is having more improvement in her Rt LE strength, partially met  this goal.  She is still having some symptoms into the Rt LE with certain activity.    Rehab Potential Good   PT Frequency 2x / week   PT Duration 6 weeks   PT Treatment/Interventions Manual techniques;Therapeutic exercise;Moist Heat;Taping;Dry needling;Cryotherapy;Electrical Stimulation;Functional mobility training;Patient/family education;Neuromuscular re-education;Ultrasound;Traction   PT Next Visit Plan traction was increased assess response.    Consulted and Agree with Plan of Care Patient      Patient will benefit from skilled therapeutic intervention in order to improve the following deficits and impairments:  Postural dysfunction, Decreased strength, Hypomobility, Pain, Increased muscle spasms  Visit Diagnosis: Right-sided low back pain with right-sided sciatica  Muscle weakness (generalized)  Cramp and spasm     Problem List Patient Active Problem List   Diagnosis Date Noted  . Left foot pain 01/10/2015  . Severe obesity (BMI >= 40) ([place]) 08/28/2014  . Cataract 08/12/2014  . Unspecified hypothyroidism 05/28/2014  . Lumbar spondylosis 02/26/2014  . Adult hypothyroidism 02/01/2014  . HLD (hyperlipidemia) 02/01/2014  . Essential hypertension 09/20/2011  . Osteoarthritis of both knees 09/20/2011  . MORBID OBESITY 04/22/2010  . POSTMENOPAUSAL STATUS 04/22/2010  . TROCHANTERIC BURSITIS, RIGHT 12/03/2009  . Goiter 04/01/2009  . Diabetes mellitus ([place]) 04/01/2009    Jeral Pinch PT 04/20/2016, 9:30 AM  [facility] [place], [place], [address] Phone: [phone]   Fax:  [phone]  Name: Jazara Swiney MRN: [id number] Date of Birth: [birth year]

## 2016-04-22 ENCOUNTER — Encounter: Payer: Medicare Other | Admitting: Physical Therapy

## 2016-04-23 ENCOUNTER — Ambulatory Visit (INDEPENDENT_AMBULATORY_CARE_PROVIDER_SITE_OTHER): Payer: Medicare Other | Admitting: Physical Therapy

## 2016-04-23 DIAGNOSIS — M5441 Lumbago with sciatica, right side: Secondary | ICD-10-CM

## 2016-04-23 DIAGNOSIS — R252 Cramp and spasm: Secondary | ICD-10-CM

## 2016-04-23 DIAGNOSIS — M6281 Muscle weakness (generalized): Secondary | ICD-10-CM | POA: Diagnosis not present

## 2016-04-23 NOTE — Therapy (Signed)
[place], [place], [address] Phone: [phone]   Fax:  [phone]  Physical Therapy Treatment  Patient Details  Name: Regina Houston MRN: [id number] Date of Birth: 19-May-1951 Referring Provider: Dr. Georgina Snell   Encounter Date: 04/23/2016      PT End of Session - 04/23/16 1408    Visit Number 13   Number of Visits 18   Date for PT Re-Evaluation 05/11/16   PT Start Time 4034   PT Stop Time 7425   PT Time Calculation (min) 47 min   Activity Tolerance Patient tolerated treatment well;No increased pain      Past Medical History  Diagnosis Date  . Diverticulitis   . Diabetes mellitus 2007  . Hyperlipidemia   . Obesity     Past Surgical History  Procedure Laterality Date  . Abdominal hysterectomy  2008    fibroids  . 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 Foot surgery  2004    hammer toe  . Biopsy breast  2007    benign calcification    There were no vitals filed for this visit.      Subjective Assessment - 04/23/16 1408    Subjective Pt reports she is sore from last session.  Not sure if was the increased pull in traction or new exercises.    Patient Stated Goals decrease pain, sleep at her previous level , cut her grass   Currently in Pain? Yes   Pain Score 2    Pain Location Back   Pain Orientation Right;Left;Lower            [facility] PT Assessment - 04/23/16 0001    Assessment   Medical Diagnosis OA lumbar spine with radiculapthy   Referring Provider Dr. Georgina Snell    Onset Date/Surgical Date 02/26/16           [facility] Adult PT Treatment/Exercise - 04/23/16 0001    Lumbar Exercises: Stretches   Passive Hamstring Stretch 2 reps;60 seconds  supine, with strap   Press Ups --  10 reps with 2 sec hold   Piriformis Stretch 2 reps;20 seconds  each side    Lumbar Exercises: Aerobic   Stationary Bike NuStep L4: 6mn    Lumbar Exercises: Standing   Wall Slides 5 reps   2 sets, 2# weight, mini-squat   Lumbar Exercises: Supine   Bridge 5 reps  with hip abd/add during bridge x 5 reps   Isometric Hip Flexion 10 reps  same side, 3 sec hold    Moist Heat Therapy   Number Minutes Moist Heat 20 Minutes   Moist Heat Location Lumbar Spine   Electrical Stimulation   Electrical Stimulation Location bilat low back paraspinals and SI area   Electrical Stimulation Action IFC   Electrical Stimulation Parameters  to tolerance    Electrical Stimulation Goals Pain   Traction   Type of Traction Lumbar  prone   Min (lbs) 65   Max (lbs) 75   Hold Time 60   Rest Time 20   Time 20                     PT Long Term Goals - 04/20/16 0855    PT LONG TERM GOAL #1   Title I with HEP ( 05/11/16)    Time 4   Period Weeks   Status On-going   PT LONG TERM GOAL #2   Title report overall  pain decrease =/> 75% with daily activity ( 05/11/16)    Time 4   Period Weeks   Status On-going   PT LONG TERM GOAL #3   Title increase strength Rt hip =/> 5-/5 to support body with standing ( 05/11/16)    Status Partially Met   PT LONG TERM GOAL #4   Title improve FOTO =/< 34% limited, CJ level ( 04/14/16)    Status Achieved   PT LONG TERM GOAL #5   Title sleep per her previous level and not wake due to low back pain ( 04/14/16)    Status Achieved               Plan - 04/23/16 1603    Clinical Impression Statement Pt tolerated all exercises without increase in pain.  Pt tolerated increased pull last session, kept pull same this session. Pt reported slight decrease in pain at end of session.  Progressing towards goals.    Rehab Potential Good   PT Frequency 2x / week   PT Duration 6 weeks   PT Treatment/Interventions Manual techniques;Therapeutic exercise;Moist Heat;Taping;Dry needling;Cryotherapy;Electrical Stimulation;Functional mobility training;Patient/family education;Neuromuscular re-education;Ultrasound;Traction   PT Next Visit Plan Continue core  strengthening.    Consulted and Agree with Plan of Care Patient      Patient will benefit from skilled therapeutic intervention in order to improve the following deficits and impairments:  Postural dysfunction, Decreased strength, Hypomobility, Pain, Increased muscle spasms  Visit Diagnosis: Right-sided low back pain with right-sided sciatica  Muscle weakness (generalized)  Cramp and spasm     Problem List Patient Active Problem List   Diagnosis Date Noted  . Left foot pain 01/10/2015  . Severe obesity (BMI >= 40) ([place]) 08/28/2014  . Cataract 08/12/2014  . Unspecified hypothyroidism 05/28/2014  . Lumbar spondylosis 02/26/2014  . Adult hypothyroidism 02/01/2014  . HLD (hyperlipidemia) 02/01/2014  . Essential hypertension 09/20/2011  . Osteoarthritis of both knees 09/20/2011  . MORBID OBESITY 04/22/2010  . POSTMENOPAUSAL STATUS 04/22/2010  . TROCHANTERIC BURSITIS, RIGHT 12/03/2009  . Goiter 04/01/2009  . Diabetes mellitus ([place]) 04/01/2009   Kerin Perna, PTA 04/23/2016 4:23 PM  [place], [place], [address] Phone: [phone]   Fax:  [phone]  Name: Regina Houston MRN: [id number] Date of Birth: November 21, 1951

## 2016-04-27 ENCOUNTER — Other Ambulatory Visit: Payer: Self-pay | Admitting: Family Medicine

## 2016-05-05 ENCOUNTER — Encounter: Payer: Self-pay | Admitting: Physical Therapy

## 2016-05-05 ENCOUNTER — Ambulatory Visit (INDEPENDENT_AMBULATORY_CARE_PROVIDER_SITE_OTHER): Payer: Medicare Other | Admitting: Physical Therapy

## 2016-05-05 DIAGNOSIS — R252 Cramp and spasm: Secondary | ICD-10-CM | POA: Diagnosis not present

## 2016-05-05 DIAGNOSIS — M5441 Lumbago with sciatica, right side: Secondary | ICD-10-CM

## 2016-05-05 DIAGNOSIS — M6281 Muscle weakness (generalized): Secondary | ICD-10-CM | POA: Diagnosis not present

## 2016-05-05 NOTE — Therapy (Signed)
[place], [place], [address] Phone: [phone]   Fax:  [phone]  Physical Therapy Treatment  Patient Details  Name: Regina Houston MRN: [id number] Date of Birth: 1950/12/17 Referring Provider: Dr. Georgina Snell  Encounter Date: 05/05/2016      PT End of Session - 05/05/16 0800    Visit Number 14   Number of Visits 18   Date for PT Re-Evaluation 05/11/16   PT Start Time 0800   PT Stop Time 3212   PT Time Calculation (min) 54 min   Activity Tolerance Patient tolerated treatment well;No increased pain      Past Medical History  Diagnosis Date  . Diverticulitis   . Diabetes mellitus 2007  . Hyperlipidemia   . Obesity     Past Surgical History  Procedure Laterality Date  . Abdominal hysterectomy  2008    fibroids  . 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 Foot surgery  2004    hammer toe  . Biopsy breast  2007    benign calcification    There were no vitals filed for this visit.      Subjective Assessment - 05/05/16 0800    Subjective Pt reports she feels good, just tired. Has been working in her kitchen, new counter, stove, sink and back splash.  Will start painting.    Patient Stated Goals decrease pain, sleep at her previous level , cut her grass   Currently in Pain? No/denies            [facility] PT Assessment - 05/05/16 0001    Assessment   Medical Diagnosis OA lumbar spine with radiculapthy   Referring Provider Dr. Georgina Snell   Onset Date/Surgical Date 02/26/16   Strength   Strength Assessment Site Hip   Right/Left Hip Right;Left   Right Hip Flexion --  5-/5   Right Hip Extension --  5-/5   Right Hip ABduction --  5-/5   Left Hip Extension 5/5   Left Hip ABduction 5/5   Right/Left Knee Right   Right Knee Flexion --  5-/5   Right Knee Extension 5/5           [facility] Adult PT Treatment/Exercise - 05/05/16 0001    Exercises   Exercises Knee/Hip;Lumbar    Lumbar Exercises: Stretches   Passive Hamstring Stretch 2 reps;60 seconds  supine, with strap- each side   Press Ups --  10 reps with 2 sec hold   Piriformis Stretch 2 reps;20 seconds  each side    Lumbar Exercises: Aerobic   Stationary Bike NuStep L4: 59mn    Lumbar Exercises: Supine   Bridge 5 reps  with hip abd/add during bridge x 5 reps   Lumbar Exercises: Sidelying   Hip Abduction 10 reps  2 sets   Lumbar Exercises: Prone   Single Arm Raise Right;Left;5 reps;1 second   Straight Leg Raise 10 reps;1 second  became painful last 2 reps   Knee/Hip Exercises: Seated   Other Seated Knee/Hip Exercises Seated on dynadisc:  marching with core engaged x 10 x 2 sets,  then posterior lean with straight back x 15 sec x 2 reps, then posterior lean with alt arms to 90 deg x 8 reps.    Moist Heat Therapy   Number Minutes Moist Heat 15 Minutes  pt in prone   Moist Heat Location Lumbar Spine   Electrical Stimulation   Electrical Stimulation Location bilat low back paraspinals  and SI area   Electrical Stimulation Action IFC   Electrical Stimulation Parameters to tolerance                      PT Long Term Goals - 05/05/16 0817    PT LONG TERM GOAL #1   Title I with HEP ( 05/11/16)    Time 4   Period Weeks   Status On-going   PT LONG TERM GOAL #2   Title report overall pain decrease =/> 75% with daily activity ( 05/11/16)    Time 4   Period Weeks   Status Achieved   PT LONG TERM GOAL #3   Title increase strength Rt hip =/> 5-/5 to support body with standing ( 05/11/16)    Time 4   Period Weeks   PT LONG TERM GOAL #4   Title improve FOTO =/< 34% limited, CJ level ( 04/14/16)    Time 6   Period Weeks   Status Achieved   PT LONG TERM GOAL #5   Title sleep per her previous level and not wake due to low back pain ( 04/14/16)    Time 6   Period Weeks   Status Achieved               Plan - 05/05/16 0820    Clinical Impression Statement Pt is reporting overall  decrease in pain; has met LTG # 2.  Pt demonstrated improved Rt hip strength; has met LTG #3.  Pt tolerated all exercises, with minimal increase in pain.  Progressing well towards remaining goals.    Rehab Potential Good   PT Frequency 2x / week   PT Duration 6 weeks   PT Treatment/Interventions Manual techniques;Therapeutic exercise;Moist Heat;Taping;Dry needling;Cryotherapy;Electrical Stimulation;Functional mobility training;Patient/family education;Neuromuscular re-education;Ultrasound;Traction   PT Next Visit Plan Assess goals and progress. Assess need for additional therapy vs. d/c to HEP (POC ends 6/20).  MD note for upcoming appt.     Consulted and Agree with Plan of Care Patient      Patient will benefit from skilled therapeutic intervention in order to improve the following deficits and impairments:  Postural dysfunction, Decreased strength, Hypomobility, Pain, Increased muscle spasms  Visit Diagnosis: Right-sided low back pain with right-sided sciatica  Muscle weakness (generalized)  Cramp and spasm     Problem List Patient Active Problem List   Diagnosis Date Noted  . Left foot pain 01/10/2015  . Severe obesity (BMI >= 40) ([place]) 08/28/2014  . Cataract 08/12/2014  . Unspecified hypothyroidism 05/28/2014  . Lumbar spondylosis 02/26/2014  . Adult hypothyroidism 02/01/2014  . HLD (hyperlipidemia) 02/01/2014  . Essential hypertension 09/20/2011  . Osteoarthritis of both knees 09/20/2011  . MORBID OBESITY 04/22/2010  . POSTMENOPAUSAL STATUS 04/22/2010  . TROCHANTERIC BURSITIS, RIGHT 12/03/2009  . Goiter 04/01/2009  . Diabetes mellitus ([place]) 04/01/2009   Kerin Perna, PTA 05/05/2016 8:43 AM  [facility] [place], [place], [address] Phone: [phone]   Fax:  [phone]  Name: Prajna Vanderpool MRN: [id number] Date of Birth: [birth year]

## 2016-05-07 ENCOUNTER — Ambulatory Visit (INDEPENDENT_AMBULATORY_CARE_PROVIDER_SITE_OTHER): Payer: Medicare Other | Admitting: Rehabilitative and Restorative Service Providers"

## 2016-05-07 ENCOUNTER — Encounter: Payer: Self-pay | Admitting: Rehabilitative and Restorative Service Providers"

## 2016-05-07 DIAGNOSIS — R252 Cramp and spasm: Secondary | ICD-10-CM

## 2016-05-07 DIAGNOSIS — M5441 Lumbago with sciatica, right side: Secondary | ICD-10-CM | POA: Diagnosis not present

## 2016-05-07 DIAGNOSIS — M6281 Muscle weakness (generalized): Secondary | ICD-10-CM | POA: Diagnosis not present

## 2016-05-07 NOTE — Therapy (Signed)
[place], [place], [address] Phone: [phone]   Fax:  [phone]  Physical Therapy Treatment  Patient Details  Name: Regina Houston MRN: [id number] Date of Birth: 30-Jul-1951 Referring Provider: Dr. Corey/Dr. Dianah Field  Encounter Date: 05/07/2016      PT End of Session - 05/07/16 1021    Visit Number 15   Number of Visits 18   Date for PT Re-Evaluation 05/11/16   PT Start Time 1021   PT Stop Time 1114   PT Time Calculation (min) 53 min   Activity Tolerance Patient tolerated treatment well      Past Medical History  Diagnosis Date  . Diverticulitis   . Diabetes mellitus 2007  . Hyperlipidemia   . Obesity     Past Surgical History  Procedure Laterality Date  . Abdominal hysterectomy  2008    fibroids  . 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 Foot surgery  2004    hammer toe  . Biopsy breast  2007    benign calcification    There were no vitals filed for this visit.      Subjective Assessment - 05/07/16 1029    Subjective Myleen reports that she is doing well. She is confident in continuing with her HEP. She returns to Dr. Darene Lamer 05/18/16.    Currently in Pain? No/denies   Pain Location Back   Pain Orientation Right;Left;Lower   Pain Descriptors / Indicators Aching   Pain Type Acute pain   Pain Onset More than a month ago   Pain Frequency Intermittent            [facility] PT Assessment - 05/07/16 0001    Assessment   Medical Diagnosis OA lumbar spine with radiculapthy   Referring Provider Dr. Corey/Dr. Dianah Field   Onset Date/Surgical Date 02/26/16   Next MD Visit 05/18/16   Observation/Other Assessments   Focus on Therapeutic Outcomes (FOTO)  28% limitation    Strength   Strength Assessment Site Hip   Right/Left Hip Right;Left   Right Hip Flexion --  5-/5   Right Hip Extension --  5-/5   Right Hip ABduction --  5-/5   Left Hip Extension 5/5   Left  Hip ABduction 5/5   Right/Left Knee Right   Right Knee Flexion --  5-/5   Right Knee Extension 5/5                     [facility] Adult PT Treatment/Exercise - 05/07/16 0001    Exercises   Exercises Knee/Hip;Lumbar   Lumbar Exercises: Stretches   Passive Hamstring Stretch 2 reps;60 seconds  supine, with strap- each side   Press Ups --  10 reps with 2 sec hold   Piriformis Stretch 2 reps;20 seconds  each side    Lumbar Exercises: Aerobic   Stationary Bike NuStep L6: min    Lumbar Exercises: Supine   Bridge 5 reps  with hip abd/add during bridge x 5 reps   Lumbar Exercises: Sidelying   Hip Abduction 10 reps  2 sets   Lumbar Exercises: Prone   Single Arm Raise Right;Left;5 reps;1 second   Straight Leg Raise 10 reps;1 second  became painful last 2 reps   Knee/Hip Exercises: Seated   Other Seated Knee/Hip Exercises Seated on dynadisc:  marching with core engaged x 10 x 2 sets,  then posterior lean with straight back x 15 sec x 2 reps, then  posterior lean with alt arms to 90 deg x 8 reps.    Moist Heat Therapy   Number Minutes Moist Heat 15 Minutes   Moist Heat Location Lumbar Spine   Electrical Stimulation   Electrical Stimulation Location bilat low back paraspinals and SI area   Electrical Stimulation Action IFC   Electrical Stimulation Parameters to tolerance   Electrical Stimulation Goals Pain;Tone                     PT Long Term Goals - 05/07/16 1024    PT LONG TERM GOAL #1   Title I with HEP ( 05/11/16)    Time 4   Period Weeks   Status Achieved   PT LONG TERM GOAL #2   Title report overall pain decrease =/> 75% with daily activity ( 05/11/16)    Time 4   Period Weeks   Status Achieved   PT LONG TERM GOAL #3   Title increase strength Rt hip =/> 5-/5 to support body with standing ( 05/11/16)    Time 4   Period Weeks   Status Partially Met   PT LONG TERM GOAL #4   Title improve FOTO =/< 34% limited, CJ level ( 04/14/16)    Time 6   Period  Weeks   Status Achieved   PT LONG TERM GOAL #5   Title sleep per her previous level and not wake due to low back pain ( 04/14/16)    Time 6   Period Weeks   Status Achieved             Patient will benefit from skilled therapeutic intervention in order to improve the following deficits and impairments:     Visit Diagnosis: Right-sided low back pain with right-sided sciatica  Muscle weakness (generalized)  Cramp and spasm     Problem List Patient Active Problem List   Diagnosis Date Noted  . Left foot pain 01/10/2015  . Severe obesity (BMI >= 40) ([place]) 08/28/2014  . Cataract 08/12/2014  . Unspecified hypothyroidism 05/28/2014  . Lumbar spondylosis 02/26/2014  . Adult hypothyroidism 02/01/2014  . HLD (hyperlipidemia) 02/01/2014  . Essential hypertension 09/20/2011  . Osteoarthritis of both knees 09/20/2011  . MORBID OBESITY 04/22/2010  . POSTMENOPAUSAL STATUS 04/22/2010  . TROCHANTERIC BURSITIS, RIGHT 12/03/2009  . Goiter 04/01/2009  . Diabetes mellitus ([place]) 04/01/2009    Chalese Peach Nilda Simmer PT, MPH  05/07/2016, 11:23 AM  [facility] [place], [place], [address] Phone: [phone]   Fax:  [phone]  Name: Traniece Boffa MRN: [id number] Date of Birth: July 19, 1951   PHYSICAL THERAPY DISCHARGE SUMMARY  Visits from Start of Care: 15  Current functional level related to goals / functional outcomes: Independent in all exercises and returned to functional activities including part time work.    Remaining deficits: Needs to continue with HEP   Education / Equipment: HEP Plan: Patient agrees to discharge.  Patient goals were partially met. Patient is being discharged due to being pleased with the current functional level.  ?????    Thoma Paulsen P. Helene Kelp PT, MPH 05/07/2016 11:26 AM

## 2016-05-18 ENCOUNTER — Ambulatory Visit (INDEPENDENT_AMBULATORY_CARE_PROVIDER_SITE_OTHER): Payer: Medicare Other | Admitting: Sports Medicine

## 2016-05-18 ENCOUNTER — Encounter: Payer: Self-pay | Admitting: Family Medicine

## 2016-05-18 ENCOUNTER — Encounter: Payer: Self-pay | Admitting: Sports Medicine

## 2016-05-18 ENCOUNTER — Ambulatory Visit (INDEPENDENT_AMBULATORY_CARE_PROVIDER_SITE_OTHER): Payer: Medicare Other | Admitting: Family Medicine

## 2016-05-18 VITALS — BP 139/57 | HR 71 | Resp 16 | Wt 255.0 lb

## 2016-05-18 VITALS — BP 139/57 | HR 71 | Wt 255.0 lb

## 2016-05-18 DIAGNOSIS — E039 Hypothyroidism, unspecified: Secondary | ICD-10-CM | POA: Diagnosis not present

## 2016-05-18 DIAGNOSIS — I10 Essential (primary) hypertension: Secondary | ICD-10-CM

## 2016-05-18 DIAGNOSIS — E119 Type 2 diabetes mellitus without complications: Secondary | ICD-10-CM

## 2016-05-18 DIAGNOSIS — R635 Abnormal weight gain: Secondary | ICD-10-CM

## 2016-05-18 DIAGNOSIS — M4726 Other spondylosis with radiculopathy, lumbar region: Secondary | ICD-10-CM

## 2016-05-18 DIAGNOSIS — E785 Hyperlipidemia, unspecified: Secondary | ICD-10-CM

## 2016-05-18 LAB — POCT GLYCOSYLATED HEMOGLOBIN (HGB A1C): Hemoglobin A1C: 5.8

## 2016-05-18 LAB — TSH: TSH: 1.04 mIU/L

## 2016-05-18 MED ORDER — SIMVASTATIN 20 MG PO TABS: 20.0000 mg | ORAL_TABLET | Freq: Every day | ORAL | Status: DC

## 2016-05-18 NOTE — Progress Notes (Signed)
  Subjective:    CC: Follow-up after epidural  HPI: Regina Houston returns, she had a fantastic response with complete pain relief after her L5-S1 epidural, no complaints.  Past medical history, Surgical history, Family history not pertinant except as noted below, Social history, Allergies, and medications have been entered into the medical record, reviewed, and no changes needed.   Review of Systems: No fevers, chills, night sweats, weight loss, chest pain, or shortness of breath.   Objective:    General: Well Developed, well nourished, and in no acute distress.  Neuro: Alert and oriented x3, extra-ocular muscles intact, sensation grossly intact.  HEENT: Normocephalic, atraumatic, pupils equal round reactive to light, neck supple, no masses, no lymphadenopathy, thyroid nonpalpable.  Skin: Warm and dry, no rashes. Cardiac: Regular rate and rhythm, no murmurs rubs or gallops, no lower extremity edema.  Respiratory: Clear to auscultation bilaterally. Not using accessory muscles, speaking in full sentences.  Impression and Recommendations:    I spent 25 minutes with this patient, greater than 50% was face-to-face time counseling regarding the above diagnoses

## 2016-05-18 NOTE — Progress Notes (Signed)
Subjective:    CC:   HPI:  Diabetes - no hypoglycemic events. No wounds or sores that are not healing well. No increased thirst or urination. Checking glucose at home. Taking medications as prescribed without any side effects.  Hypertension- Pt denies chest pain, SOB, dizziness, or heart palpitations.  Taking meds as directed w/o problems.  Denies medication side effects.    Abnormal weight gain/morbid obesity-she did start the Topamax but says it makes her feel very sleepy and tired. She is up to 25 mg twice a day. She doesn't feel like this is a good fit for her. She is Artie on metformin. She is not on Trulicity. She was pretty some phentermine but came off that about 3 months ago. She has been trying to get some exercise and has been counting her calories. She did not have any side effects on the phentermine. No recent chest pain or shortness of breath or palpitations. No significant cardiac history.  Hypothyroidism-no recent skin or hair changes. She has gained 2 pounds since I last saw her. She is taking her medication regularly.  Past medical history, Surgical history, Family history not pertinant except as noted below, Social history, Allergies, and medications have been entered into the medical record, reviewed, and corrections made.   Review of Systems: No fevers, chills, night sweats, weight loss, chest pain, or shortness of breath.   Objective:    General: Well Developed, well nourished, and in no acute distress.  Neuro: Alert and oriented x3, extra-ocular muscles intact, sensation grossly intact.  HEENT: Normocephalic, atraumatic  Skin: Warm and dry, no rashes. Cardiac: Regular rate and rhythm, no murmurs rubs or gallops, no lower extremity edema.  Respiratory: Clear to auscultation bilaterally. Not using accessory muscles, speaking in full sentences.   Impression and Recommendations:   DM - Well controlled. Hemoglobin A1c of 5.8 today though it is up a little. Continue work  on diet and exercise. She has gained 2 pounds since I last saw her. Currently on ACE inhibitor and statin.  Hypertension-well-controlled. Continue current regimen. Next  Abnormal weight gain/BMI 42 she actually has gained 2 pounds since I last saw her. Discontinue Topamax. Discussed options. Restart phentermine. New prescription sent to the pharmacy. Follow-up in one month for blood pressure and weight check.  Hypothyroidism-due to recheck TSH. Will adjust medication if needed.

## 2016-05-18 NOTE — Assessment & Plan Note (Signed)
Regina Houston has had multiple interventions on her lumbar spine so far and several months of physical therapy. 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 Happy with results, she did talk to Dr. Christella Noa with [facility], no surgical intervention recommended.

## 2016-05-19 ENCOUNTER — Other Ambulatory Visit: Payer: Self-pay | Admitting: Family Medicine

## 2016-05-19 LAB — BASIC METABOLIC PANEL
BUN: 33 mg/dL — ABNORMAL HIGH (ref 7–25)
CO2: 23 mmol/L (ref 20–31)
Calcium: 9.8 mg/dL (ref 8.6–10.4)
Chloride: 108 mmol/L (ref 98–110)
Creat: 1.2 mg/dL — ABNORMAL HIGH (ref 0.50–0.99)
Glucose, Bld: 123 mg/dL — ABNORMAL HIGH (ref 65–99)
Potassium: 4.9 mmol/L (ref 3.5–5.3)
Sodium: 141 mmol/L (ref 135–146)

## 2016-05-19 LAB — LIPID PANEL
Cholesterol: 182 mg/dL (ref 125–200)
HDL: 65 mg/dL (ref >=46)
LDL Cholesterol: 102 mg/dL (ref <130)
Total CHOL/HDL Ratio: 2.8 Ratio (ref <=5.0)
Triglycerides: 74 mg/dL (ref <150)
VLDL: 15 mg/dL (ref <30)

## 2016-05-19 NOTE — Addendum Note (Signed)
Addended by: Teddy Spike on: 05/19/2016 12:31 PM   Modules accepted: Orders

## 2016-05-20 ENCOUNTER — Other Ambulatory Visit: Payer: Self-pay | Admitting: *Deleted

## 2016-05-21 ENCOUNTER — Other Ambulatory Visit: Payer: Self-pay | Admitting: Sports Medicine

## 2016-05-28 ENCOUNTER — Other Ambulatory Visit: Payer: Self-pay | Admitting: Family Medicine

## 2016-06-07 ENCOUNTER — Telehealth: Payer: Self-pay | Admitting: *Deleted

## 2016-06-07 NOTE — Telephone Encounter (Signed)
[employer], spoke with [place]. Was informed when they tried to file insurance information, Medicare would not show up as Primary. Pt contacted Medicare today and was informed they are primary. Randell Loop will resubmit blood work cost in 2 days to [employer], Pt has been advised to contact [place] for any updates.

## 2016-06-07 NOTE — Telephone Encounter (Signed)
Coverage on what?

## 2016-06-07 NOTE — Telephone Encounter (Signed)
Pt called and stated that she had spoken w/Kelsi about her coverage being denied. She stated that Medicare is her primary coverage. Will fwd to [employer].Audelia Hives [place]

## 2016-06-07 NOTE — Telephone Encounter (Signed)
This is regarding coverage on labs that were ordered by PCP on 05/18/16. [facility] incorrectly [employer]. Will contact [facility] to see if they can re-file.

## 2016-06-08 ENCOUNTER — Ambulatory Visit: Payer: Medicare Other | Admitting: Family Medicine

## 2016-06-15 ENCOUNTER — Encounter: Payer: Self-pay | Admitting: Family Medicine

## 2016-06-15 ENCOUNTER — Ambulatory Visit (INDEPENDENT_AMBULATORY_CARE_PROVIDER_SITE_OTHER): Payer: Medicare Other | Admitting: Family Medicine

## 2016-06-15 VITALS — BP 145/64 | HR 77 | Ht 65.25 in | Wt 249.0 lb

## 2016-06-15 DIAGNOSIS — Z0000 Encounter for general adult medical examination without abnormal findings: Secondary | ICD-10-CM | POA: Diagnosis not present

## 2016-06-15 DIAGNOSIS — Z1231 Encounter for screening mammogram for malignant neoplasm of breast: Secondary | ICD-10-CM | POA: Diagnosis not present

## 2016-06-15 MED ORDER — LISINOPRIL 20 MG PO TABS: 20.0000 mg | ORAL_TABLET | Freq: Every day | ORAL | 1 refills | Status: DC

## 2016-06-15 MED ORDER — PHENTERMINE HCL 37.5 MG PO TABS
ORAL_TABLET | ORAL | 0 refills | Status: DC
Start: 2016-06-15 — End: 2016-07-13

## 2016-06-15 NOTE — Patient Instructions (Signed)
Bone Densitometry Bone densitometry is an imaging test that uses a special X-ray to measure the amount of calcium and other minerals in your bones (bone density). This test is also known as a bone mineral density test or dual-energy X-ray absorptiometry (DXA). The test can measure bone density at your hip and your spine. It is similar to having a regular X-ray. You may have this test to:  Diagnose a condition that causes weak or thin bones (osteoporosis).  Predict your risk of a broken bone (fracture).  Determine how well osteoporosis treatment is working. LET [facility] CARE PROVIDER KNOW ABOUT:  Any allergies you have.  All medicines you are taking, including vitamins, herbs, eye drops, creams, and over-the-counter medicines.  Previous problems you or members of your family have had with the use of anesthetics.  Any blood disorders you have.  Previous surgeries you have had.  Medical conditions you have.  Possibility of pregnancy.  Any other medical test you had within the previous 14 days that used contrast material. RISKS AND COMPLICATIONS Generally, this is a safe procedure. However, problems can occur and may include the following:  This test exposes you to a very small amount of radiation.  The risks of radiation exposure may be greater to unborn children. BEFORE THE PROCEDURE  Do not take any calcium supplements for 24 hours before having the test. You can otherwise eat and drink what you usually do.  Take off all metal jewelry, eyeglasses, dental appliances, and any other metal objects. PROCEDURE  You may lie on an exam table. There will be an X-ray generator below you and an imaging device above you.  Other devices, such as boxes or braces, may be used to position your body properly for the scan.  You will need to lie still while the machine slowly scans your body.  The images will show up on a computer monitor. AFTER THE PROCEDURE You may need more testing  at a later time.   This information is not intended to replace advice given to you by your health care provider. Make sure you discuss any questions you have with your health care provider.   Document Released: 11/30/2004 Document Revised: 11/29/2014 Document Reviewed: 04/18/2014 Elsevier Interactive Patient Education 2016 [place] Maintenance, Female Adopting a healthy lifestyle and getting preventive care can go a long way to promote health and wellness. Talk with your health care provider about what schedule of regular examinations is right for you. This is a good chance for you to check in with your provider about disease prevention and staying healthy. In between checkups, there are plenty of things you can do on your own. Experts have done a lot of research about which lifestyle changes and preventive measures are most likely to keep you healthy. Ask your health care provider for more information. WEIGHT AND DIET  Eat a healthy diet  Be sure to include plenty of vegetables, fruits, low-fat dairy products, and lean protein.  Do not eat a lot of foods high in solid fats, added sugars, or salt.  Get regular exercise. This is one of the most important things you can do for your health.  Most adults should exercise for at least 150 minutes each week. The exercise should increase your heart rate and make you sweat (moderate-intensity exercise).  Most adults should also do strengthening exercises at least twice a week. This is in addition to the moderate-intensity exercise.  Maintain a healthy weight  Body mass index (  BMI) is a measurement that can be used to identify possible weight problems. It estimates body fat based on height and weight. Your health care provider can help determine your BMI and help you achieve or maintain a healthy weight.  For females 53 years of age and older:   A BMI below 18.5 is considered underweight.  A BMI of 18.5 to 24.9 is normal.  A BMI  of 25 to 29.9 is considered overweight.  A BMI of 30 and above is considered obese.  Watch levels of cholesterol and blood lipids  You should start having your blood tested for lipids and cholesterol at 67 years of age, then have this test every 5 years.  You may need to have your cholesterol levels checked more often if:  Your lipid or cholesterol levels are high.  You are older than 67 years of age.  You are at high risk for heart disease.  CANCER SCREENING   Lung Cancer  Lung cancer screening is recommended for adults 25-62 years old who are at high risk for lung cancer because of a history of smoking.  A yearly low-dose CT scan of the lungs is recommended for people who:  Currently smoke.  Have quit within the past 15 years.  Have at least a 30-pack-year history of smoking. A pack year is smoking an average of one pack of cigarettes a day for 1 year.  Yearly screening should continue until it has been 15 years since you quit.  Yearly screening should stop if you develop a health problem that would prevent you from having lung cancer treatment.  Breast Cancer  Practice breast self-awareness. This means understanding how your breasts normally appear and feel.  It also means doing regular breast self-exams. Let your health care provider know about any changes, no matter how small.  If you are in your 20s or 30s, you should have a clinical breast exam (CBE) by a health care provider every 1-3 years as part of a regular health exam.  If you are 76 or older, have a CBE every year. Also consider having a breast X-ray (mammogram) every year.  If you have a family history of breast cancer, talk to your health care provider about genetic screening.  If you are at high risk for breast cancer, talk to your health care provider about having an MRI and a mammogram every year.  Breast cancer gene (BRCA) assessment is recommended for women who have family members with  BRCA-related cancers. BRCA-related cancers include:  Breast.  Ovarian.  Tubal.  Peritoneal cancers.  Results of the assessment will determine the need for genetic counseling and BRCA1 and BRCA2 testing. Cervical Cancer Your health care provider may recommend that you be screened regularly for cancer of the pelvic organs (ovaries, uterus, and vagina). This screening involves a pelvic examination, including checking for microscopic changes to the surface of your cervix (Pap test). You may be encouraged to have this screening done every 3 years, beginning at age 23.  For women ages 2-65, health care providers may recommend pelvic exams and Pap testing every 3 years, or they may recommend the Pap and pelvic exam, combined with testing for human papilloma virus (HPV), every 5 years. Some types of HPV increase your risk of cervical cancer. Testing for HPV may also be done on women of any age with unclear Pap test results.  Other health care providers may not recommend any screening for nonpregnant women who are considered low risk for  pelvic cancer and who do not have symptoms. Ask your health care provider if a screening pelvic exam is right for you.  If you have had past treatment for cervical cancer or a condition that could lead to cancer, you need Pap tests and screening for cancer for at least 20 years after your treatment. If Pap tests have been discontinued, your risk factors (such as having a new sexual partner) need to be reassessed to determine if screening should resume. Some women have medical problems that increase the chance of getting cervical cancer. In these cases, your health care provider may recommend more frequent screening and Pap tests. Colorectal Cancer  This type of cancer can be detected and often prevented.  Routine colorectal cancer screening usually begins at 67 years of age and continues through 67 years of age.  Your health care provider may recommend screening at  an earlier age if you have risk factors for colon cancer.  Your health care provider may also recommend using home test kits to check for hidden blood in the stool.  A small camera at the end of a tube can be used to examine your colon directly (sigmoidoscopy or colonoscopy). This is done to check for the earliest forms of colorectal cancer.  Routine screening usually begins at age 52.  Direct examination of the colon should be repeated every 5-10 years through 67 years of age. However, you may need to be screened more often if early forms of precancerous polyps or small growths are found. Skin Cancer  Check your skin from head to toe regularly.  Tell your health care provider about any new moles or changes in moles, especially if there is a change in a mole's shape or color.  Also tell your health care provider if you have a mole that is larger than the size of a pencil eraser.  Always use sunscreen. Apply sunscreen liberally and repeatedly throughout the day.  Protect yourself by wearing long sleeves, pants, a wide-brimmed hat, and sunglasses whenever you are outside. HEART DISEASE, DIABETES, AND HIGH BLOOD PRESSURE   High blood pressure causes heart disease and increases the risk of stroke. High blood pressure is more likely to develop in:  People who have blood pressure in the high end of the normal range (130-139/85-89 mm Hg).  People who are overweight or obese.  People who are African American.  If you are 6-41 years of age, have your blood pressure checked every 3-5 years. If you are 20 years of age or older, have your blood pressure checked every year. You should have your blood pressure measured twice--once when you are at a hospital or clinic, and once when you are not at a hospital or clinic. Record the average of the two measurements. To check your blood pressure when you are not at a hospital or clinic, you can use:  An automated blood pressure machine at a  pharmacy.  A home blood pressure monitor.  If you are between 39 years and 11 years old, ask your health care provider if you should take aspirin to prevent strokes.  Have regular diabetes screenings. This involves taking a blood sample to check your fasting blood sugar level.  If you are at a normal weight and have a low risk for diabetes, have this test once every three years after 67 years of age.  If you are overweight and have a high risk for diabetes, consider being tested at a younger age or more often. PREVENTING  INFECTION  Hepatitis B  If you have a higher risk for hepatitis B, you should be screened for this virus. You are considered at high risk for hepatitis B if:  You were born in a country where hepatitis B is common. Ask your health care provider which countries are considered high risk.  Your parents were born in a high-risk country, and you have not been immunized against hepatitis B (hepatitis B vaccine).  You have HIV or AIDS.  You use needles to inject street drugs.  You live with someone who has hepatitis B.  You have had sex with someone who has hepatitis B.  You get hemodialysis treatment.  You take certain medicines for conditions, including cancer, organ transplantation, and autoimmune conditions. Hepatitis C  Blood testing is recommended for:  Everyone born from [birth year] through 1967.  Anyone with known risk factors for hepatitis C. Sexually transmitted infections (STIs)  You should be screened for sexually transmitted infections (STIs) including gonorrhea and chlamydia if:  You are sexually active and are younger than 67 years of age.  You are older than 67 years of age and your health care provider tells you that you are at risk for this type of infection.  Your sexual activity has changed since you were last screened and you are at an increased risk for chlamydia or gonorrhea. Ask your health care provider if you are at risk.  If you do not  have HIV, but are at risk, it may be recommended that you take a prescription medicine daily to prevent HIV infection. This is called pre-exposure prophylaxis (PrEP). You are considered at risk if:  You are sexually active and do not regularly use condoms or know the HIV status of your partner(s).  You take drugs by injection.  You are sexually active with a partner who has HIV. Talk with your health care provider about whether you are at high risk of being infected with HIV. If you choose to begin PrEP, you should first be tested for HIV. You should then be tested every 3 months for as long as you are taking PrEP.  PREGNANCY   If you are premenopausal and you may become pregnant, ask your health care provider about preconception counseling.  If you may become pregnant, take 400 to 800 micrograms (mcg) of folic acid every day.  If you want to prevent pregnancy, talk to your health care provider about birth control (contraception). OSTEOPOROSIS AND MENOPAUSE   Osteoporosis is a disease in which the bones lose minerals and strength with aging. This can result in serious bone fractures. Your risk for osteoporosis can be identified using a bone density scan.  If you are 58 years of age or older, or if you are at risk for osteoporosis and fractures, ask your health care provider if you should be screened.  Ask your health care provider whether you should take a calcium or vitamin D supplement to lower your risk for osteoporosis.  Menopause may have certain physical symptoms and risks.  Hormone replacement therapy may reduce some of these symptoms and risks. Talk to your health care provider about whether hormone replacement therapy is right for you.  HOME CARE INSTRUCTIONS   Schedule regular health, dental, and eye exams.  Stay current with your immunizations.   Do not use any tobacco products including cigarettes, chewing tobacco, or electronic cigarettes.  If you are pregnant, do not  drink alcohol.  If you are breastfeeding, limit how much and how often  you drink alcohol.  Limit alcohol intake to no more than 1 drink per day for nonpregnant women. One drink equals 12 ounces of beer, 5 ounces of wine, or 1 ounces of hard liquor.  Do not use street drugs.  Do not share needles.  Ask your health care provider for help if you need support or information about quitting drugs.  Tell your health care provider if you often feel depressed.  Tell your health care provider if you have ever been abused or do not feel safe at home.   This information is not intended to replace advice given to you by your health care provider. Make sure you discuss any questions you have with your health care provider.   Document Released: 05/24/2011 Document Revised: 11/29/2014 Document Reviewed: 10/10/2013 Elsevier Interactive Patient Education 2016 [place] A mammogram is an X-ray of the breasts that is done to check for abnormal changes. This procedure can screen for and detect any changes that may suggest breast cancer. A mammogram can also identify other changes and variations in the breast, such as:  Inflammation of the breast tissue (mastitis).  An infected area that contains a collection of pus (abscess).  A fluid-filled sac (cyst).  Fibrocystic changes. This is when breast tissue becomes denser, which can make the tissue feel rope-like or uneven under the skin.  Tumors that are not cancerous (benign). LET [facility] CARE PROVIDER KNOW ABOUT:  Any allergies you have.  If you have breast implants.  If you have had previous breast disease, biopsy, or surgery.  If you are breastfeeding.  Any possibility that you could be pregnant, if this applies.  If you are younger than age 23.  If you have a family history of breast cancer. RISKS AND COMPLICATIONS Generally, this is a safe procedure. However, problems may occur, including:  Exposure to radiation.  Radiation levels are very low with this test.  The results being misinterpreted.  The need for further tests.  The inability of the mammogram to detect certain cancers. BEFORE THE PROCEDURE  Schedule your test about 1-2 weeks after your menstrual period. This is usually when your breasts are the least tender.  If you have had a mammogram done at a different facility in the past, get the mammogram X-rays or have them sent to your current exam facility in order to compare them.  Wash your breasts and under your arms the day of the test.  Do not wear deodorants, perfumes, lotions, or powders anywhere on your body on the day of the test.  Remove any jewelry from your neck.  Wear clothes that you can change into and out of easily. PROCEDURE  You will undress from the waist up and put on a gown.  You will stand in front of the X-ray machine.  Each breast will be placed between two plastic or glass plates. The plates will compress your breast for a few seconds. Try to stay as relaxed as possible during the procedure. This does not cause any harm to your breasts and any discomfort you feel will be very brief.  X-rays will be taken from different angles of each breast. The procedure may vary among health care providers and hospitals. AFTER THE PROCEDURE  The mammogram will be examined by a specialist (radiologist).  You may need to repeat certain parts of the test, depending on the quality of the images. This is commonly done if the radiologist needs a better view of the breast tissue.  Ask when your test results will be ready. Make sure you get your test results.  You may resume your normal activities.   This information is not intended to replace advice given to you by your health care provider. Make sure you discuss any questions you have with your health care provider.   Document Released: 11/05/2000 Document Revised: 07/30/2015 Document Reviewed: 01/17/2015 Elsevier Interactive  Patient Education [employer].

## 2016-06-15 NOTE — Progress Notes (Signed)
Subjective:   Regina Houston is a 65 y.o. female who presents for Medicare Annual (Subsequent) preventive examination.  Review of Systems:  Comprehensive ROS is negative.         Objective:     Vitals: BP (!) 145/64 (BP Location: Left Arm, Patient Position: Sitting, Cuff Size: Normal)   Pulse 77   Ht 5' 5.25" (1.657 m)   Wt 249 lb (112.9 kg)   SpO2 95%   BMI 41.12 kg/m   Body mass index is 41.12 kg/m.   Tobacco History  Smoking Status  . Never Smoker  Smokeless Tobacco  . Not on file     Counseling given: Not Answered   Past Medical History:  Diagnosis Date  . Diabetes mellitus 2007  . Diverticulitis   . Hyperlipidemia   . Obesity    Past Surgical History:  Procedure Laterality Date  . ABDOMINAL HYSTERECTOMY  2008   fibroids  . BIOPSY BREAST  2007   benign calcification  . 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 FOOT SURGERY  2004   hammer toe   Family History  Problem Relation Age of Onset  . Hypertension Mother   . Stroke Mother   . Diabetes Mother   . Kidney disease Father     kidney failure  . Heart disease Maternal Grandmother     MI  . Diabetes Maternal Grandmother   . Heart disease Mother 64  . Heart disease Father 35   History  Sexual Activity  . Sexual activity: Not Currently    Outpatient Encounter Prescriptions as of 06/15/2016  Medication Sig  . AMBULATORY NON FORMULARY MEDICATION Medication Name: Glucometer.  STrips and lancets to test twice daily.   Dx 250.00.  Marland Kitchen aspirin 81 MG tablet Take 81 mg by mouth daily.    . calcium carbonate (OS-CAL) 600 MG TABS Take 600 mg by mouth 2 (two) times daily with a meal.    . celecoxib (CELEBREX) 200 MG capsule TAKE ONE TO TWO CAPSULES BY MOUTH ONCE DAILY AS NEEDED FOR PAIN  . diclofenac (VOLTAREN) 75 MG EC tablet TAKE ONE TABLET BY MOUTH TWICE DAILY  . fish oil-omega-3 fatty acids 1000 MG capsule Take 2 g by mouth daily.    . furosemide (LASIX) 20 MG tablet TAKE ONE TABLET BY  MOUTH ONCE DAILY  . levothyroxine (SYNTHROID, LEVOTHROID) 100 MCG tablet TAKE ONE TABLET BY MOUTH ONCE DAILY  . lisinopril (PRINIVIL,ZESTRIL) 20 MG tablet TAKE ONE TABLET BY MOUTH ONCE DAILY  . metFORMIN (GLUCOPHAGE) 500 MG tablet TAKE ONE TABLET BY MOUTH TWICE DAILY WITH MEALS  . Multiple Vitamin (THERA) TABS Take 1 tablet by mouth.  . phentermine (ADIPEX-P) 37.5 MG tablet TAKE ONE TABLET BY MOUTH ONCE DAILY BEFORE BREAKFAST  . simvastatin (ZOCOR) 20 MG tablet Take 1 tablet (20 mg total) by mouth at bedtime.  . traMADol (ULTRAM) 50 MG tablet TAKE ONE TO TWO TABLETS BY MOUTH EVERY 8 HOURS AS NEEDED FOR PAIN. **MAX  OF  6  TABLETS  PER  DAY**  . Urea 40 % LOTN Apply 1 application topically 2 (two) times daily as needed.  . [DISCONTINUED] AMBULATORY NON FORMULARY MEDICATION Knee-high, medium compression, graduated compression stockings. Apply to lower extremities.  . [DISCONTINUED] topiramate (TOPAMAX) 25 MG capsule    No facility-administered encounter medications on file as of 06/15/2016.     Activities of Daily Living In your present state of health, do you have any difficulty performing the  following activities: 06/15/2016  Hearing? N  Vision? N  Difficulty concentrating or making decisions? N  Walking or climbing stairs? N  Dressing or bathing? N  Doing errands, shopping? N  Some recent data might be hidden    Patient Care Team: Hali Marry, MD as PCP - General    Assessment:    Medicare WEllness Exam    Exercise Activities and Dietary recommendations    Goals    None     Fall Risk Fall Risk  06/15/2016 05/18/2016 04/03/2015 02/19/2014  Falls in the past year? Yes Yes No No  Number falls in past yr: 1 1 - -  Injury with Fall? No No - -  Follow up Falls prevention discussed Falls prevention discussed - -   Depression Screen PHQ 2/9 Scores 06/15/2016 05/18/2016 04/03/2015 02/19/2014  PHQ - 2 Score 0 0 0 0     Cognitive Testing No flowsheet data  found.  Immunization History  Administered Date(s) Administered  . DTP 11/22/2006  . Influenza Split 10/10/2012  . Influenza Whole 11/22/2006, 10/01/2009, 08/22/2010  . Influenza,inj,Quad PF,36+ Mos 08/28/2014, 08/26/2015  . Influenza-Unspecified 10/01/2013  . Pneumococcal Conjugate-13 02/19/2014  . Pneumococcal Polysaccharide-23 11/22/2005, 04/03/2015  . Td 11/22/2006  . Zoster 05/04/2011   Screening Tests Health Maintenance  Topic Date Due  . MAMMOGRAM  05/28/2016  . INFLUENZA VACCINE  06/22/2016  . FOOT EXAM  10/01/2016  . OPHTHALMOLOGY EXAM  10/19/2016  . HEMOGLOBIN A1C  11/17/2016  . COLONOSCOPY  11/22/2016  . TETANUS/TDAP  11/22/2016  . DEXA SCAN  Completed  . ZOSTAVAX  Completed  . Hepatitis C Screening  Completed  . PNA vac Low Risk Adult  Completed      Plan:    CPE During the course of the visit the patient was educated and counseled about the following appropriate screening and preventive services:   Vaccines to include Pneumoccal, Influenza, Hepatitis B, Td, Zostavax, HCV  Cardiovascular Disease  Colorectal cancer screening - prefers cologuard. Form completed and faxed.    Bone density screening  Diabetes screening  Glaucoma screening  Mammography/PAP  Nutrition counseling   Obesity - refill phentermine.   Patient Instructions (the written plan) was given to the patient.   Kimbrely Buckel, MD  06/15/2016

## 2016-06-23 DIAGNOSIS — Z1212 Encounter for screening for malignant neoplasm of rectum: Secondary | ICD-10-CM | POA: Diagnosis not present

## 2016-06-23 DIAGNOSIS — Z1211 Encounter for screening for malignant neoplasm of colon: Secondary | ICD-10-CM | POA: Diagnosis not present

## 2016-06-25 ENCOUNTER — Other Ambulatory Visit: Payer: Self-pay | Admitting: Family Medicine

## 2016-06-30 ENCOUNTER — Ambulatory Visit: Payer: Medicare Other

## 2016-07-02 ENCOUNTER — Telehealth: Payer: Self-pay | Admitting: Family Medicine

## 2016-07-02 ENCOUNTER — Ambulatory Visit (INDEPENDENT_AMBULATORY_CARE_PROVIDER_SITE_OTHER): Payer: Medicare Other

## 2016-07-02 DIAGNOSIS — Z1231 Encounter for screening mammogram for malignant neoplasm of breast: Secondary | ICD-10-CM | POA: Diagnosis not present

## 2016-07-02 LAB — COLOGUARD: Cologuard: POSITIVE

## 2016-07-09 NOTE — Telephone Encounter (Signed)
Blank note 

## 2016-07-13 ENCOUNTER — Ambulatory Visit (INDEPENDENT_AMBULATORY_CARE_PROVIDER_SITE_OTHER): Payer: Medicare Other | Admitting: Family Medicine

## 2016-07-13 VITALS — BP 125/56 | HR 79 | Ht 65.0 in | Wt 247.0 lb

## 2016-07-13 DIAGNOSIS — R635 Abnormal weight gain: Secondary | ICD-10-CM | POA: Diagnosis not present

## 2016-07-13 MED ORDER — PHENTERMINE HCL 37.5 MG PO TABS: ORAL_TABLET | ORAL | 0 refills | Status: DC

## 2016-07-13 NOTE — Progress Notes (Signed)
Patient was in office for weight and blood pressure check. Patient stated that she did not have any complaints today. Patient was down 2 pounds from her last visit. Rx Phentermine was printed and in basket to sign. Khaylee Mcevoy,CMA

## 2016-07-13 NOTE — Progress Notes (Signed)
Agree with above.  Malin Sambrano, MD  

## 2016-07-15 ENCOUNTER — Other Ambulatory Visit: Payer: Self-pay | Admitting: Family Medicine

## 2016-07-15 ENCOUNTER — Other Ambulatory Visit: Payer: Self-pay | Admitting: Sports Medicine

## 2016-07-21 ENCOUNTER — Encounter: Payer: Self-pay | Admitting: Family Medicine

## 2016-07-21 NOTE — Telephone Encounter (Signed)
Call patient and let her know that her Cologuard test was positive. We will need to refer her to GI for a colonoscopy. Please see if she is okay with this and see if she has a preferred location. If okay go ahead and place referral for gastroenterology.

## 2016-07-21 NOTE — Telephone Encounter (Signed)
Left message for a return call

## 2016-07-21 NOTE — Telephone Encounter (Signed)
Regina Houston called back and left a message for a return call. I called her back at her work as requested, however she was on break. [phone]

## 2016-08-04 NOTE — Telephone Encounter (Signed)
lvm informing pt to rtn call with where she would like to have colonoscopy done if she has a preferred location.Regina Houston

## 2016-08-05 ENCOUNTER — Other Ambulatory Visit: Payer: Self-pay | Admitting: Family Medicine

## 2016-08-05 ENCOUNTER — Telehealth: Payer: Self-pay | Admitting: Family Medicine

## 2016-08-05 NOTE — Telephone Encounter (Signed)
Pt stated she needs to schedule a colonoscopy. She ststed she had already spoken with you and you had asked for her to call you back -- Thanks

## 2016-08-10 ENCOUNTER — Ambulatory Visit (INDEPENDENT_AMBULATORY_CARE_PROVIDER_SITE_OTHER): Payer: Medicare Other | Admitting: Family Medicine

## 2016-08-10 VITALS — BP 127/57 | HR 75 | Temp 97.8°F | Wt 246.0 lb

## 2016-08-10 DIAGNOSIS — Z23 Encounter for immunization: Secondary | ICD-10-CM | POA: Diagnosis not present

## 2016-08-10 DIAGNOSIS — R635 Abnormal weight gain: Secondary | ICD-10-CM | POA: Diagnosis not present

## 2016-08-10 DIAGNOSIS — Z1211 Encounter for screening for malignant neoplasm of colon: Secondary | ICD-10-CM

## 2016-08-10 MED ORDER — PHENTERMINE HCL 37.5 MG PO TABS: ORAL_TABLET | ORAL | 0 refills | Status: DC

## 2016-08-10 NOTE — Progress Notes (Signed)
We will go ahead and refill phentermine but I would encourage her to try to lose at least 3 pounds between this month and next month. Continue to work on calorie counting and regular exercise to help rev up the metabolism. Signed off on or for colonoscopy. [name], MD

## 2016-08-10 NOTE — Progress Notes (Signed)
Patient is here for blood pressure and weight check. Denies any trouble sleeping, palpitations, or any other medication problems. Patient has lost only 1 pound. Pt reports "work has been hectic." A refill for Phentermine will be sent to Provider for review. Patient advised if new Rx is sent in to schedule a four week nurse visit and keep her upcoming appointment with her PCP, verbalized understanding. Pt also mentions she was advised of positive cologuard results, would like to go ahead and get Colonoscopy completed. Will pend referral for PCP to sign off on. Pt also got her flu shot in office today. Pt does have documented allergy to latex, reports it only causes a small rash. Pt has gotten the flu shot before and never had a negative reaction. Tolerated immunization well in left deltoid, no immediate complications. No further questions.

## 2016-08-10 NOTE — Progress Notes (Signed)
Pt advised.

## 2016-08-31 ENCOUNTER — Other Ambulatory Visit: Payer: Self-pay | Admitting: Family Medicine

## 2016-09-07 ENCOUNTER — Ambulatory Visit: Payer: Medicare Other

## 2016-09-08 ENCOUNTER — Ambulatory Visit (INDEPENDENT_AMBULATORY_CARE_PROVIDER_SITE_OTHER): Payer: Medicare Other | Admitting: Family Medicine

## 2016-09-08 VITALS — BP 127/46 | HR 75 | Ht 65.0 in | Wt 248.0 lb

## 2016-09-08 DIAGNOSIS — R635 Abnormal weight gain: Secondary | ICD-10-CM | POA: Diagnosis not present

## 2016-09-08 DIAGNOSIS — Z6841 Body Mass Index (BMI) 40.0 and over, adult: Secondary | ICD-10-CM

## 2016-09-08 NOTE — Progress Notes (Signed)
Agree above.   [name], MD

## 2016-09-08 NOTE — Progress Notes (Signed)
Pt in for weight/bp check.  She is actually up 2lbs from last visit.  I advised her that the phentermine probably wouldn't get refilled this time since there is an increase of weight gain.  I advised her to schedule a f/u with pcp.

## 2016-09-16 ENCOUNTER — Other Ambulatory Visit: Payer: Self-pay | Admitting: Sports Medicine

## 2016-09-21 DIAGNOSIS — Z1211 Encounter for screening for malignant neoplasm of colon: Secondary | ICD-10-CM | POA: Diagnosis not present

## 2016-09-21 DIAGNOSIS — D124 Benign neoplasm of descending colon: Secondary | ICD-10-CM | POA: Diagnosis not present

## 2016-09-21 DIAGNOSIS — D120 Benign neoplasm of cecum: Secondary | ICD-10-CM | POA: Diagnosis not present

## 2016-09-21 LAB — HM COLONOSCOPY

## 2016-09-23 ENCOUNTER — Encounter: Payer: Self-pay | Admitting: Family Medicine

## 2016-09-28 ENCOUNTER — Ambulatory Visit (INDEPENDENT_AMBULATORY_CARE_PROVIDER_SITE_OTHER): Payer: Medicare Other | Admitting: Family Medicine

## 2016-09-28 ENCOUNTER — Encounter: Payer: Self-pay | Admitting: Family Medicine

## 2016-09-28 VITALS — BP 138/62 | HR 67 | Wt 250.0 lb

## 2016-09-28 DIAGNOSIS — Z6841 Body Mass Index (BMI) 40.0 and over, adult: Secondary | ICD-10-CM | POA: Diagnosis not present

## 2016-09-28 DIAGNOSIS — E119 Type 2 diabetes mellitus without complications: Secondary | ICD-10-CM | POA: Diagnosis not present

## 2016-09-28 DIAGNOSIS — R635 Abnormal weight gain: Secondary | ICD-10-CM

## 2016-09-28 MED ORDER — LIRAGLUTIDE 18 MG/3ML ~~LOC~~ SOPN: 0.6000 mg | PEN_INJECTOR | Freq: Every day | SUBCUTANEOUS | 3 refills | Status: DC

## 2016-09-28 NOTE — Progress Notes (Signed)
Subjective:    CC: WEight gain  HPI:  She is here today to follow-up on obesity. She was previously on phentermine. She was able to lose about 10 pounds but it seems like it's no longer effective are working well. She's been exercising some but admits it has not been consistent. He has come in today so that we can discuss other strategies that might be helpful for her. She does take metformin daily for diabetes. He didn't see an advertisement for research study to help with for weight loss in agents over 65 and wanted to know my thoughts on it. It is through [facility].  BP 138/62   Pulse 67   Wt 250 lb (113.4 kg)   SpO2 98%   BMI 41.60 kg/m     Allergies  Allergen Reactions  . Latex Rash  . Tape Rash    Past Medical History:  Diagnosis Date  . Diabetes mellitus 2007  . Diverticulitis   . Hyperlipidemia   . Obesity     Past Surgical History:  Procedure Laterality Date  . ABDOMINAL HYSTERECTOMY  2008   fibroids  . BIOPSY BREAST  2007   benign calcification  . 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 FOOT SURGERY  2004   hammer toe    Social History   Social History  . Marital status: Single    Spouse name: N/A  . Number of children: 0  . Years of education: BA   Occupational History  . 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    Social History Main Topics  . Smoking status: Never Smoker  . Smokeless tobacco: Not on file  . Alcohol use No  . Drug use: No  . Sexual activity: Not Currently   Other Topics Concern  . Not on file   Social History Narrative   NO regular exercise.     Family History  Problem Relation Age of Onset  . Hypertension Mother   . Stroke Mother   . Diabetes Mother   . Kidney disease Father     kidney failure  . Heart disease Maternal Grandmother     MI  . Diabetes Maternal Grandmother   . Heart disease Mother 71  . Heart disease Father 77    Outpatient Encounter Prescriptions as of 09/28/2016   Medication Sig  . AMBULATORY NON FORMULARY MEDICATION Medication Name: Glucometer.  STrips and lancets to test twice daily.   Dx 250.00.  Marland Kitchen aspirin 81 MG tablet Take 81 mg by mouth daily.    . calcium carbonate (OS-CAL) 600 MG TABS Take 600 mg by mouth 2 (two) times daily with a meal.    . celecoxib (CELEBREX) 200 MG capsule TAKE ONE TO TWO CAPSULES BY MOUTH ONCE DAILY AS NEEDED FOR PAIN  . fish oil-omega-3 fatty acids 1000 MG capsule Take 2 g by mouth daily.    . furosemide (LASIX) 20 MG tablet TAKE ONE TABLET BY MOUTH ONCE DAILY  . levothyroxine (SYNTHROID, LEVOTHROID) 100 MCG tablet TAKE ONE TABLET BY MOUTH ONCE DAILY  . liraglutide (VICTOZA) 18 MG/3ML SOPN Inject 0.1 mLs (0.6 mg total) into the skin daily. Increase to 1.2mg  after one week.  Marland Kitchen lisinopril (PRINIVIL,ZESTRIL) 20 MG tablet Take 1 tablet (20 mg total) by mouth daily.  . metFORMIN (GLUCOPHAGE) 500 MG tablet TAKE ONE TABLET BY MOUTH TWICE DAILY WITH MEALS  . Multiple Vitamin (THERA) TABS Take 1 tablet by mouth.  . simvastatin (ZOCOR) 20  MG tablet Take 1 tablet (20 mg total) by mouth at bedtime.  . traMADol (ULTRAM) 50 MG tablet TAKE ONE TO TWO TABLETS BY MOUTH EVERY 8 HOURS AS NEEDED FOR PAIN MAXIMUM  OF  6  TABLETS  PER  DAY  . Urea 40 % LOTN Apply 1 application topically 2 (two) times daily as needed.  . [DISCONTINUED] diclofenac (VOLTAREN) 75 MG EC tablet TAKE ONE TABLET BY MOUTH TWICE DAILY  . [DISCONTINUED] phentermine (ADIPEX-P) 37.5 MG tablet TAKE ONE TABLET BY MOUTH ONCE DAILY BEFORE BREAKFAST   No facility-administered encounter medications on file as of 09/28/2016.         Objective:    General: Well Developed, well nourished, and in no acute distress.  Neuro: Alert and oriented x3, extra-ocular muscles intact, sensation grossly intact.  HEENT: Normocephalic, atraumatic  Skin: Warm and dry, no rashes. Cardiac: Regular rate and rhythm, no murmurs rubs or gallops, no lower extremity edema.  Respiratory: Clear to  auscultation bilaterally. Not using accessory muscles, speaking in full sentences.   Impression and Recommendations:    Obesity/BMI 41-discussed options today. I think it would be great for her to join a more comprehensive program that can help set structure for exercise and have the capability to have a nutritionist etc. I think nearly helpful for her. We also discussed setting small goals. I recommended that she plan on her week as far as dietary choices and meal planning as well as scheduling her exercise on her plantar's and she is now working part-time. She typically works around 28 hours per week. We also discussed other options as far as medication to help her curb appetite. I think she be great candidate for Victoza since she is diabetic. It would help lower her A1c as well as possibly help curb appetite and help her with her attempts at weight loss. I did encourage her to check into the research program that offers a more comprehensive program. I think she would be a great candidate for this.  DM- will start Victoza. F/U in 6 weeks. Will be due for repeat A1C at that time.    Time spent 20 min, > 50% spent counseling about weight strategies.

## 2016-09-28 NOTE — Patient Instructions (Signed)
Encourage you to really plan your activities the week and adding exercise on your plantar. Also encouraged you to preplan meals and food choices for the entire week before going shopping. This will be easier to stick to when she has a plan in place area and.

## 2016-10-25 ENCOUNTER — Other Ambulatory Visit: Payer: Self-pay | Admitting: Family Medicine

## 2016-11-03 DIAGNOSIS — H524 Presbyopia: Secondary | ICD-10-CM | POA: Diagnosis not present

## 2016-11-03 DIAGNOSIS — H52223 Regular astigmatism, bilateral: Secondary | ICD-10-CM | POA: Diagnosis not present

## 2016-11-03 DIAGNOSIS — H5201 Hypermetropia, right eye: Secondary | ICD-10-CM | POA: Diagnosis not present

## 2016-11-03 DIAGNOSIS — H2513 Age-related nuclear cataract, bilateral: Secondary | ICD-10-CM | POA: Diagnosis not present

## 2016-11-03 DIAGNOSIS — H5212 Myopia, left eye: Secondary | ICD-10-CM | POA: Diagnosis not present

## 2016-11-03 DIAGNOSIS — H43391 Other vitreous opacities, right eye: Secondary | ICD-10-CM | POA: Diagnosis not present

## 2016-11-03 DIAGNOSIS — E119 Type 2 diabetes mellitus without complications: Secondary | ICD-10-CM | POA: Diagnosis not present

## 2016-11-03 LAB — HM DIABETES EYE EXAM

## 2016-11-09 ENCOUNTER — Encounter: Payer: Self-pay | Admitting: Family Medicine

## 2016-11-09 ENCOUNTER — Ambulatory Visit (INDEPENDENT_AMBULATORY_CARE_PROVIDER_SITE_OTHER): Payer: Medicare Other | Admitting: Family Medicine

## 2016-11-09 VITALS — BP 133/56 | HR 67 | Ht 65.0 in | Wt 257.0 lb

## 2016-11-09 DIAGNOSIS — E119 Type 2 diabetes mellitus without complications: Secondary | ICD-10-CM

## 2016-11-09 DIAGNOSIS — Z6841 Body Mass Index (BMI) 40.0 and over, adult: Secondary | ICD-10-CM

## 2016-11-09 DIAGNOSIS — I10 Essential (primary) hypertension: Secondary | ICD-10-CM | POA: Diagnosis not present

## 2016-11-09 DIAGNOSIS — M21969 Unspecified acquired deformity of unspecified lower leg: Secondary | ICD-10-CM

## 2016-11-09 LAB — BASIC METABOLIC PANEL WITH GFR
BUN: 22 mg/dL (ref 7–25)
CO2: 27 mmol/L (ref 20–31)
Calcium: 9.3 mg/dL (ref 8.6–10.4)
Chloride: 106 mmol/L (ref 98–110)
Creat: 0.99 mg/dL (ref 0.50–0.99)
GFR, Est African American: 68 mL/min (ref >=60)
GFR, Est Non African American: 59 mL/min — ABNORMAL LOW (ref >=60)
Glucose, Bld: 105 mg/dL — ABNORMAL HIGH (ref 65–99)
Potassium: 4.4 mmol/L (ref 3.5–5.3)
Sodium: 142 mmol/L (ref 135–146)

## 2016-11-09 LAB — POCT GLYCOSYLATED HEMOGLOBIN (HGB A1C): Hemoglobin A1C: 6.3

## 2016-11-09 NOTE — Progress Notes (Signed)
Subjective:    CC: DM  HPI: Diabetes - no hypoglycemic events. No wounds or sores that are not healing well. No increased thirst or urination. Checking glucose at home. Taking medications as prescribed without any side effects.  Started Victoza at last office visit to help improve her A1c as well as to help her appetite.   Obesity/BMI 42   - her weight is up 7 more poundsThan last office visit.  She was also looking into a more comprehensive program for weight loss through a research study at [facility]. She said she never got a chance to call and check into it. She still working part-time. Not actively exercising. She says she has been trying to work on eating a little bit more healthy but has a hard time around the holidays.  Hypertension- Pt denies chest pain, SOB, dizziness, or heart palpitations.  Taking meds as directed w/o problems.  Denies medication side effects.     Past medical history, Surgical history, Family history not pertinant except as noted below, Social history, Allergies, and medications have been entered into the medical record, reviewed, and corrections made.   Review of Systems: No fevers, chills, night sweats, weight loss, chest pain, or shortness of breath.   Objective:    General: Well Developed, well nourished, and in no acute distress.  Neuro: Alert and oriented x3, extra-ocular muscles intact, sensation grossly intact.  HEENT: Normocephalic, atraumatic  Skin: Warm and dry, no rashes. Cardiac: Regular rate and rhythm, no murmurs rubs or gallops, no lower extremity edema.  Respiratory: Clear to auscultation bilaterally. Not using accessory muscles, speaking in full sentences.   Impression and Recommendations:    DM -  A1C is up from last time. Now 6.3. Discussed getting back on track with diet and exercise.  She is planning on looking into Research program for weight loss at [facility].  Obesity/BMI 42 - Discussed options. I really want her to start some  type of exercise routine. She does have a lot of pain with her feet so recommended things like the stationary bike which could increase her activity level and help her burn calories. Also encouraged her to continue to work on diet. I noticeably difficult around the holidays but she just needs to make purposeful food choices. Encouraged her to continue to try to check into the program effort [facility].  HTN - Well controlled. Continue current regimen. Follow up in  6 mo.   Foot deformity-she has some hammertoes. She is at the point where she would like to see podiatry for it. She saw Dr. Arcelia Jew years ago. We'll place referral.

## 2016-11-17 ENCOUNTER — Other Ambulatory Visit: Payer: Self-pay | Admitting: Family Medicine

## 2016-11-17 ENCOUNTER — Encounter: Payer: Self-pay | Admitting: Podiatry

## 2016-11-17 ENCOUNTER — Ambulatory Visit (INDEPENDENT_AMBULATORY_CARE_PROVIDER_SITE_OTHER): Payer: Medicare Other | Admitting: Podiatry

## 2016-11-17 VITALS — BP 149/75 | HR 64 | Ht 65.0 in | Wt 252.0 lb

## 2016-11-17 DIAGNOSIS — M216X2 Other acquired deformities of left foot: Secondary | ICD-10-CM | POA: Diagnosis not present

## 2016-11-17 DIAGNOSIS — M65979 Unspecified synovitis and tenosynovitis, unspecified ankle and foot: Secondary | ICD-10-CM

## 2016-11-17 DIAGNOSIS — M216X1 Other acquired deformities of right foot: Secondary | ICD-10-CM | POA: Diagnosis not present

## 2016-11-17 DIAGNOSIS — M79671 Pain in right foot: Secondary | ICD-10-CM | POA: Diagnosis not present

## 2016-11-17 DIAGNOSIS — M79672 Pain in left foot: Secondary | ICD-10-CM | POA: Diagnosis not present

## 2016-11-17 DIAGNOSIS — M659 Synovitis and tenosynovitis, unspecified: Secondary | ICD-10-CM | POA: Diagnosis not present

## 2016-11-17 DIAGNOSIS — B351 Tinea unguium: Secondary | ICD-10-CM

## 2016-11-17 NOTE — Patient Instructions (Signed)
Seen for bilateral foot pain. Noted of collapsing arch, left foot bunion, contracted 3rd and 5th toe right. Reviewed findings. May benefit from orthotics, ankle brace, surgical correction of the deformed toes right foot. All calluses and nails debrided. Ankle brace placed on right foot. Return in 3 weeks.

## 2016-11-17 NOTE — Progress Notes (Signed)
SUBJECTIVE: 65 y.o. year old female presents stating that her feet are hurting all the time. Also have problem with corns, and calluses.  Pain is along the bunion and side of little toe area.  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 HgA1c was 6.3.  REVIEW OF SYSTEMS: A comprehensive review of systems was negative except for: overweight, Type II diabetic.  OBJECTIVE: DERMATOLOGIC EXAMINATION: Pinch calluses under the bunion area right foot.   VASCULAR EXAMINATION OF LOWER LIMBS: All pedal pulses are palpable with normal pulsation.  No visible edema or erythema noted. Temperature gradient from tibial crest to dorsum of foot is within normal bilateral.  NEUROLOGIC EXAMINATION OF THE LOWER LIMBS: Achilles DTR is present and within normal. Monofilament (Semmes-Weinstein 10-gm) sensory testing positive 6 out of 6, bilateral. Vibratory sensations(128Hz  turning fork) intact at medial and lateral forefoot bilateral.  Sharp and Dull discriminatory sensations at the plantar ball of hallux is intact bilateral.   MUSCULOSKELETAL EXAMINATION: Collapsing medial longitudinal arch bilateral. Symptomatic bunion deformity left foot, contracted digits 3rd and 5th right. Pain with weight bearing at midfoot and rearfoot bilateral.  ASSESSMENT: Collapsing arch bilateral. STJ hyperpronation bilateral. Bunion deformity left. Plantar callus under right first MPJ. Contracted lesser digits. NIDDM under control.  PLAN: Reviewed clinical findings and available treatment options. All plantar calluses and nails debrided. Ankle brace dispensed to right foot excess pronation.  Reviewed benefit of custom orthotics. Return in 3 weeks.

## 2016-11-24 ENCOUNTER — Other Ambulatory Visit: Payer: Self-pay | Admitting: Family Medicine

## 2016-11-24 ENCOUNTER — Other Ambulatory Visit: Payer: Self-pay | Admitting: Sports Medicine

## 2016-12-06 ENCOUNTER — Ambulatory Visit: Payer: Medicare Other | Admitting: Podiatry

## 2016-12-07 ENCOUNTER — Other Ambulatory Visit: Payer: Self-pay | Admitting: Family Medicine

## 2016-12-08 ENCOUNTER — Ambulatory Visit: Payer: No Typology Code available for payment source | Admitting: Podiatry

## 2016-12-14 ENCOUNTER — Ambulatory Visit (INDEPENDENT_AMBULATORY_CARE_PROVIDER_SITE_OTHER): Payer: Medicare Other | Admitting: Podiatry

## 2016-12-14 ENCOUNTER — Encounter: Payer: Self-pay | Admitting: Podiatry

## 2016-12-14 DIAGNOSIS — M79671 Pain in right foot: Secondary | ICD-10-CM | POA: Diagnosis not present

## 2016-12-14 DIAGNOSIS — M216X1 Other acquired deformities of right foot: Secondary | ICD-10-CM

## 2016-12-14 DIAGNOSIS — M2041 Other hammer toe(s) (acquired), right foot: Secondary | ICD-10-CM

## 2016-12-14 DIAGNOSIS — M216X2 Other acquired deformities of left foot: Secondary | ICD-10-CM

## 2016-12-14 NOTE — Patient Instructions (Signed)
Pain in right foot. Reviewed clinical findings and x-ray findings. Pre op for Hammer toe repair 3rd and 5th reviewed.

## 2016-12-14 NOTE — Progress Notes (Signed)
SUBJECTIVE: 66 y.o. year old female presents to discuss possible option on surgical correction of painful toes on right foot. The 3rd toe curls under the 2nd toe and abnormal lesion at distal lateral aspect of 5th toe right foot. She was prescribed with Orthotics for flattening arch by Dr. Prudy Feeler but unable to wear due to pain and callus formation at instep fallen arch of left foot.   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  REVIEW OF SYSTEMS: A comprehensive review of systems was negative except for: overweight, Type II diabetic.  OBJECTIVE: DERMATOLOGIC EXAMINATION: Pinch calluses under the bunion area right foot.   VASCULAR EXAMINATION OF LOWER LIMBS: All pedal pulses are palpable with normal pulsation.  No visible edema or erythema noted. Temperature gradient from tibial crest to dorsum of foot is within normal bilateral.  NEUROLOGIC EXAMINATION OF THE LOWER LIMBS: All epicritic and tactile sensations grossly intact. Sharp and Dull discriminatory sensations at the plantar ball of hallux is intact bilateral.   MUSCULOSKELETAL EXAMINATION: Collapsing medial longitudinal arch bilateral. Symptomatic  contracted digits 3rd and 5th right. Pain with weight bearing at midfoot and rearfoot bilateral.  Radiographic examination of the right foot reveal post surgical screw in place on the first and 2nd metatarsal head area right foot. Severe medial deviation of the 3rd MPJ. Fused middle and distal phalanx 5th digit right.   ASSESSMENT: Collapsing arch bilateral. STJ hyperpronation bilateral. Bunion deformity left. Symptomatic contracted lesser digits 3rd and 5th right. NIDDM under control.  PLAN: Reviewed clinical findings and available treatment options.  As per request surgical options reviewed for Hammer toe repair 2nd and 5th right.

## 2016-12-30 ENCOUNTER — Other Ambulatory Visit: Payer: Self-pay | Admitting: Podiatry

## 2016-12-30 MED ORDER — HYDROCODONE-IBUPROFEN 7.5-200 MG PO TABS: 1.0000 | ORAL_TABLET | Freq: Four times a day (QID) | ORAL | 0 refills | Status: DC | PRN

## 2016-12-31 DIAGNOSIS — M2041 Other hammer toe(s) (acquired), right foot: Secondary | ICD-10-CM | POA: Diagnosis not present

## 2016-12-31 DIAGNOSIS — M2040 Other hammer toe(s) (acquired), unspecified foot: Secondary | ICD-10-CM | POA: Diagnosis not present

## 2016-12-31 DIAGNOSIS — M24576 Contracture, unspecified foot: Secondary | ICD-10-CM | POA: Diagnosis not present

## 2016-12-31 HISTORY — PX: OTHER SURGICAL HISTORY: SHX169

## 2017-01-04 ENCOUNTER — Other Ambulatory Visit: Payer: Self-pay | Admitting: Family Medicine

## 2017-01-06 ENCOUNTER — Encounter: Payer: Self-pay | Admitting: Podiatry

## 2017-01-06 ENCOUNTER — Ambulatory Visit (INDEPENDENT_AMBULATORY_CARE_PROVIDER_SITE_OTHER): Payer: Medicare Other | Admitting: Podiatry

## 2017-01-06 DIAGNOSIS — M2041 Other hammer toe(s) (acquired), right foot: Secondary | ICD-10-CM

## 2017-01-06 MED ORDER — AMOXICILLIN-POT CLAVULANATE 875-125 MG PO TABS: 1.0000 | ORAL_TABLET | Freq: Two times a day (BID) | ORAL | 0 refills | Status: DC

## 2017-01-06 NOTE — Patient Instructions (Addendum)
Post op check on 3rd and 5th toe right. Noted of excess redness on 3rd toe. Continue with minimum ambulation, elevate foot, take antibiotics. Return next Tuesday.

## 2017-01-07 ENCOUNTER — Encounter: Payer: Self-pay | Admitting: Podiatry

## 2017-01-07 NOTE — Progress Notes (Signed)
5 day post op following hammer toe repair 3rd and 5th right, 12/31/16. Patient denies any  Discomfort or fever. Noted of erythema with blister at distal end of 3rd digit right. Area cleansed with Iodine. And wet to dry Iodine dressing applied. Rx for Augmentin 875/125 prescribed. Will monitor closely.

## 2017-01-11 ENCOUNTER — Encounter: Payer: Self-pay | Admitting: Podiatry

## 2017-01-11 ENCOUNTER — Ambulatory Visit (INDEPENDENT_AMBULATORY_CARE_PROVIDER_SITE_OTHER): Payer: Medicare Other | Admitting: Podiatry

## 2017-01-11 DIAGNOSIS — Z9889 Other specified postprocedural states: Secondary | ICD-10-CM

## 2017-01-11 NOTE — Progress Notes (Signed)
2 weeks post op wound right foot following hammer toe surgery 3rd and 5th right, 12/31/16. Last week, the first post op visit noted that 3rd digit had erythema with blister just distal to the skin incision. Patient was placed on Augmentin. Today the wound is dry with dark red skin just distal to the incision at old blister site 3rd digit. No active drainage noted. All sutures removed. Betadine cleansing and dressing applied. Instructed to keep the wound clean and dry. Return in one week.

## 2017-01-11 NOTE — Patient Instructions (Signed)
Follow up on post op wound right foot. Infected toe is improving. Continue to stay off of feet and keep the bandage dry. Return in 1 week.

## 2017-01-18 ENCOUNTER — Encounter: Payer: Self-pay | Admitting: Podiatry

## 2017-01-18 ENCOUNTER — Ambulatory Visit (INDEPENDENT_AMBULATORY_CARE_PROVIDER_SITE_OTHER): Payer: Medicare Other | Admitting: Podiatry

## 2017-01-18 DIAGNOSIS — Z9889 Other specified postprocedural states: Secondary | ICD-10-CM

## 2017-01-18 MED ORDER — AMOXICILLIN-POT CLAVULANATE 875-125 MG PO TABS: 1.0000 | ORAL_TABLET | Freq: Two times a day (BID) | ORAL | 0 refills | Status: DC

## 2017-01-18 NOTE — Patient Instructions (Signed)
Post op visit with follow up on infected 3rd toe right. Wound is drying and slow healing without increasing cellulitis. Will re start another dose of antibiotics. Follow instruction on dressing change with Amerigel ointment. Return in one week.

## 2017-01-18 NOTE — Progress Notes (Signed)
3 weeks post op wound right foot following hammer toe surgery 3rd and 5th right, 12/31/16. During the first post op visit noted that 3rd digit had erythema with blister just distal to the skin incision. Patient was placed on Augmentin.  Today the toe is still red with dark brown dry skin at the distal end of toe. No active drainage noted. No edema or expanded erythema noted.  Betadine cleansing and Amerigel dressing applied. Home care supply dispensed to repeat dressing change daily. Continue another dose of Augmenting.   Return in one week.

## 2017-01-19 ENCOUNTER — Telehealth: Payer: Self-pay | Admitting: *Deleted

## 2017-01-19 ENCOUNTER — Encounter: Payer: Self-pay | Admitting: Podiatry

## 2017-01-19 NOTE — Telephone Encounter (Signed)
01/19/17 Dr.Sheard Patient called this am and ask can you write her a note to be out another 4 weeks so she can be good and healed by the time she should go back to work. She can pick the letter up. She called it a work release note.

## 2017-01-25 ENCOUNTER — Encounter: Payer: Self-pay | Admitting: Podiatry

## 2017-01-25 ENCOUNTER — Ambulatory Visit (INDEPENDENT_AMBULATORY_CARE_PROVIDER_SITE_OTHER): Payer: Medicare Other | Admitting: Podiatry

## 2017-01-25 DIAGNOSIS — Z9889 Other specified postprocedural states: Secondary | ICD-10-CM

## 2017-01-25 NOTE — Progress Notes (Signed)
Subjective: 4 weeks post op wound right foot following hammer toe surgery 3rd and 5th right, 12/31/16. Stated that dry skin and toe nail came off and left with raw skin at distal end. Patient is still on 2nd series of Augmentin. Been doing daily dressing change as instructed with Amerigel ointment. Patient extended leave another 4 weeks.  Decreased redness and healed incision site. Minimum edema on affected toe 3rd right. Well healed 5th toe right. Positive of ripped off skin at distal end of 3rd toe about 0.4 cm in diameter. No edema or expanded erythema noted.  Plan: Continue daily dressing using Amerigel ointment to the open spot 3rd toe right foot till it become dry.  Betadine cleansing and Amerigel dressing applied. May try regular shoe as tolerated. Return in 2 weeks.

## 2017-01-25 NOTE — Patient Instructions (Signed)
Post op wound healing well. Had skin loss at distal end possible from ripped off skin. Continue with daily wound care with Amerigel ointment.  Ok to wear regular shoes as tolerated. Return in 2 weeks.

## 2017-01-28 ENCOUNTER — Other Ambulatory Visit: Payer: Self-pay | Admitting: Sports Medicine

## 2017-01-31 ENCOUNTER — Other Ambulatory Visit: Payer: Self-pay | Admitting: *Deleted

## 2017-01-31 MED ORDER — GLUCOSE BLOOD VI STRP
ORAL_STRIP | 12 refills | Status: DC
Start: 2017-01-31 — End: 2018-05-12

## 2017-02-08 ENCOUNTER — Ambulatory Visit (INDEPENDENT_AMBULATORY_CARE_PROVIDER_SITE_OTHER): Payer: Medicare Other | Admitting: Family Medicine

## 2017-02-08 ENCOUNTER — Ambulatory Visit (INDEPENDENT_AMBULATORY_CARE_PROVIDER_SITE_OTHER): Payer: Medicare Other | Admitting: Podiatry

## 2017-02-08 ENCOUNTER — Encounter: Payer: Self-pay | Admitting: Podiatry

## 2017-02-08 ENCOUNTER — Encounter: Payer: Self-pay | Admitting: Family Medicine

## 2017-02-08 ENCOUNTER — Telehealth: Payer: Self-pay | Admitting: Family Medicine

## 2017-02-08 VITALS — BP 133/65 | HR 72 | Ht 65.0 in | Wt 262.0 lb

## 2017-02-08 DIAGNOSIS — I10 Essential (primary) hypertension: Secondary | ICD-10-CM | POA: Diagnosis not present

## 2017-02-08 DIAGNOSIS — E119 Type 2 diabetes mellitus without complications: Secondary | ICD-10-CM

## 2017-02-08 DIAGNOSIS — Z9889 Other specified postprocedural states: Secondary | ICD-10-CM

## 2017-02-08 DIAGNOSIS — Z6841 Body Mass Index (BMI) 40.0 and over, adult: Secondary | ICD-10-CM | POA: Diagnosis not present

## 2017-02-08 LAB — POCT GLYCOSYLATED HEMOGLOBIN (HGB A1C): Hemoglobin A1C: 5.9

## 2017-02-08 MED ORDER — AMBULATORY NON FORMULARY MEDICATION: 0 refills | Status: DC

## 2017-02-08 NOTE — Patient Instructions (Addendum)
Post op wound right 3rd toe healed well. No edema or abnormal findings. Return as needed.+ /

## 2017-02-08 NOTE — Telephone Encounter (Signed)
Have her call her insurance to see if can get it here in the office.

## 2017-02-08 NOTE — Progress Notes (Signed)
Subjective:    CC: HTN, DM  HPI:  Diabetes - no hypoglycemic events. No wounds or sores that are not healing well. No increased thirst or urination. Checking glucose at home. Taking medications as prescribed without any side effects.  Hypertension- Pt denies chest pain, SOB, dizziness, or heart palpitations.  Taking meds as directed w/o problems.  Denies medication side effects.    She finally had her foot surgery with Dr. Caffie Pinto.  Thought she had a post of infection.    Obesity/ BMI 43 - she hasn't been able to exercise because of her recent Foot surgery. Originally she had taken off for weeks and then was planning on going back to work but unfortunately she ended up with an ear infection one of her toes and so that has delayed the healing a little bit. But she plans to start exercising and walking again soon as she is able. Unfortunately she has gained some weight through this.  Past medical history, Surgical history, Family history not pertinant except as noted below, Social history, Allergies, and medications have been entered into the medical record, reviewed, and corrections made.   Review of Systems: No fevers, chills, night sweats, weight loss, chest pain, or shortness of breath.   Objective:    General: Well Developed, well nourished, and in no acute distress.  Neuro: Alert and oriented x3, extra-ocular muscles intact, sensation grossly intact.  HEENT: Normocephalic, atraumatic  Skin: Warm and dry, no rashes. Cardiac: Regular rate and rhythm, no murmurs rubs or gallops, no lower extremity edema.  Respiratory: Clear to auscultation bilaterally. Not using accessory muscles, speaking in full sentences.   Impression and Recommendations:    DM- Well controlled.Hemoglobin A1c of 5.9 today which is absolutely fantastic. Continue current regimen. Continue current regimen. Follow up in  4 months. She did have her eye exam done in November at [facility] eye care. Will call for that  report.  HTN - Well controlled. Continue current regimen. Follow up in  4 months.   Due for Tdap. Scription given so that she can get this done at the pharmacy since her primary insurance is Medicare.  Bone density-I did review her report. She is due for a repeat in 5-10 years as her T score was 0.6 which was normal.  Obesity/ BMI 43 - assess getting back on track with diet and exercise.

## 2017-02-08 NOTE — Telephone Encounter (Signed)
Pt came in stating that she took your advice about going to [facility] to get her tetanus shot. However, the shot costs almost $60, and Medicare does not pay. Are there any other options available to her?   She is also going to call Medicare to inquire about her options. Please advise. Thanks!

## 2017-02-08 NOTE — Progress Notes (Signed)
Subjective: 6weeks post op wound right foot following hammer toe surgery 3rd and 5th right, 12/31/16. Stated that scab came off the other day and toe has normal color without any swelling.  Objective: Infection has resolved, incision site well healed. The digit is straight without further deformity 3rd right.  5th toe healed well without complication.    Assessment: Well healed surgical wound with corrected deformity 3rd and 5th right. Post op infection on 3rd digit right has been resolved.  Plan: Reviewed findings. Return as needed.

## 2017-02-09 NOTE — Telephone Encounter (Signed)
Left recommendation on Pt's VM. Callback information provided.

## 2017-02-10 ENCOUNTER — Encounter: Payer: Self-pay | Admitting: Family Medicine

## 2017-02-14 ENCOUNTER — Encounter: Payer: Self-pay | Admitting: Family Medicine

## 2017-02-15 NOTE — Telephone Encounter (Signed)
Please that patient that we completed form for special foot wear. We sent it back down to Dr. Irene Pap office.  As far as the cost of the vaccine. You can check with Anderson Malta and see if she knows. I have no clear.

## 2017-02-20 ENCOUNTER — Other Ambulatory Visit: Payer: Self-pay | Admitting: Family Medicine

## 2017-02-22 ENCOUNTER — Encounter: Payer: Self-pay | Admitting: Family Medicine

## 2017-03-16 ENCOUNTER — Ambulatory Visit: Payer: No Typology Code available for payment source | Admitting: Podiatry

## 2017-03-17 ENCOUNTER — Other Ambulatory Visit: Payer: Self-pay | Admitting: Sports Medicine

## 2017-03-21 ENCOUNTER — Ambulatory Visit: Payer: No Typology Code available for payment source | Admitting: Podiatry

## 2017-04-04 ENCOUNTER — Encounter: Payer: Self-pay | Admitting: Podiatry

## 2017-04-04 ENCOUNTER — Ambulatory Visit (INDEPENDENT_AMBULATORY_CARE_PROVIDER_SITE_OTHER): Payer: No Typology Code available for payment source | Admitting: Podiatry

## 2017-04-04 DIAGNOSIS — Z9889 Other specified postprocedural states: Secondary | ICD-10-CM

## 2017-04-04 NOTE — Patient Instructions (Signed)
12 week post op following 3rd and 5th digit hammer toe repair. At this time the surgical site healed well with corrected deformity. Patient denies any discomfort. Return as needed.

## 2017-04-04 NOTE — Progress Notes (Signed)
Subjective: 12weeks post op wound right foot following hammer toe surgery 3rd and 5th right, 12/31/16. She has been back to work part time without problem.  Objective: Well healed surgical toes with corrected deformities 3rd and 5th right. No edema or pain with daily activity.   Assessment: Well healed surgical toes with corrected deformity 3rd and 5th right.   Plan: Reviewed findings. Discharged from po care.

## 2017-05-22 ENCOUNTER — Other Ambulatory Visit: Payer: Self-pay | Admitting: Family Medicine

## 2017-05-31 ENCOUNTER — Other Ambulatory Visit: Payer: Self-pay | Admitting: Family Medicine

## 2017-06-13 ENCOUNTER — Other Ambulatory Visit: Payer: Self-pay | Admitting: Family Medicine

## 2017-06-13 DIAGNOSIS — E785 Hyperlipidemia, unspecified: Secondary | ICD-10-CM

## 2017-06-14 ENCOUNTER — Encounter: Payer: Self-pay | Admitting: Family Medicine

## 2017-06-14 ENCOUNTER — Ambulatory Visit (INDEPENDENT_AMBULATORY_CARE_PROVIDER_SITE_OTHER): Payer: Medicare Other | Admitting: Family Medicine

## 2017-06-14 VITALS — BP 118/51 | HR 67 | Ht 65.0 in | Wt 269.0 lb

## 2017-06-14 DIAGNOSIS — E119 Type 2 diabetes mellitus without complications: Secondary | ICD-10-CM | POA: Diagnosis not present

## 2017-06-14 DIAGNOSIS — M47816 Spondylosis without myelopathy or radiculopathy, lumbar region: Secondary | ICD-10-CM

## 2017-06-14 DIAGNOSIS — I10 Essential (primary) hypertension: Secondary | ICD-10-CM

## 2017-06-14 DIAGNOSIS — E039 Hypothyroidism, unspecified: Secondary | ICD-10-CM

## 2017-06-14 LAB — COMPLETE METABOLIC PANEL WITH GFR
ALT: 15 U/L (ref 6–29)
AST: 18 U/L (ref 10–35)
Albumin: 4.2 g/dL (ref 3.6–5.1)
Alkaline Phosphatase: 97 U/L (ref 33–130)
BUN: 23 mg/dL (ref 7–25)
CO2: 25 mmol/L (ref 20–31)
Calcium: 9.1 mg/dL (ref 8.6–10.4)
Chloride: 105 mmol/L (ref 98–110)
Creat: 0.97 mg/dL (ref 0.50–0.99)
GFR, Est African American: 69 mL/min (ref >=60)
GFR, Est Non African American: 60 mL/min (ref >=60)
Glucose, Bld: 107 mg/dL — ABNORMAL HIGH (ref 65–99)
Potassium: 4.5 mmol/L (ref 3.5–5.3)
Sodium: 140 mmol/L (ref 135–146)
Total Bilirubin: 0.5 mg/dL (ref 0.2–1.2)
Total Protein: 6.2 g/dL (ref 6.1–8.1)

## 2017-06-14 LAB — LIPID PANEL W/REFLEX DIRECT LDL
Cholesterol: 161 mg/dL (ref <200)
HDL: 74 mg/dL (ref >50)
LDL-Cholesterol: 73 mg/dL
Non-HDL Cholesterol (Calc): 87 mg/dL (ref <130)
Total CHOL/HDL Ratio: 2.2 Ratio (ref <5.0)
Triglycerides: 68 mg/dL (ref <150)

## 2017-06-14 LAB — POCT GLYCOSYLATED HEMOGLOBIN (HGB A1C): Hemoglobin A1C: 6.1

## 2017-06-14 LAB — TSH: TSH: 1.21 mIU/L

## 2017-06-14 MED ORDER — TRAMADOL HCL 50 MG PO TABS: ORAL_TABLET | ORAL | 1 refills | Status: DC

## 2017-06-14 MED ORDER — CELECOXIB 200 MG PO CAPS: ORAL_CAPSULE | ORAL | 1 refills | Status: DC

## 2017-06-14 NOTE — Progress Notes (Signed)
Subjective:    CC: HTN, DM  HPI: Hypertension- Pt denies chest pain, SOB, dizziness, or heart palpitations.  Taking meds as directed w/o problems.  Denies medication side effects.    Diabetes - no hypoglycemic events. No wounds or sores that are not healing well. No increased thirst or urination. Checking glucose at home. Taking medications as prescribed without any side effects.  Hypthyroidism - No recent skin or hair changes. No significant weight changes.  Lumbar spondylosis - she needs her celebrex and tramadol refilled. Tolerating medication well.   Past medical history, Surgical history, Family history not pertinant except as noted below, Social history, Allergies, and medications have been entered into the medical record, reviewed, and corrections made.   Review of Systems: No fevers, chills, night sweats, weight loss, chest pain, or shortness of breath.   Objective:    General: Well Developed, well nourished, and in no acute distress.  Neuro: Alert and oriented x3, extra-ocular muscles intact, sensation grossly intact.  HEENT: Normocephalic, atraumatic, no thyromegaly.   Skin: Warm and dry, no rashes. Cardiac: Regular rate and rhythm, no murmurs rubs or gallops, no lower extremity edema. NO carotid bruits.  Respiratory: Clear to auscultation bilaterally. Not using accessory muscles, speaking in full sentences.  No car   Impression and Recommendations:    HTN - Well controlled. Continue current regimen. Follow up in  6 mo.   DM- Well controlled. A1C up a little at 6.1. Continue current regimen. Follow up in  3-4 months.   Hypothyroid - due to recheck TSH.   Lumbar spondylosis -RF Celebrex and Tramadol.

## 2017-07-12 ENCOUNTER — Other Ambulatory Visit: Payer: Self-pay | Admitting: Family Medicine

## 2017-08-23 ENCOUNTER — Other Ambulatory Visit: Payer: Self-pay | Admitting: Family Medicine

## 2017-08-26 DIAGNOSIS — Z23 Encounter for immunization: Secondary | ICD-10-CM | POA: Diagnosis not present

## 2017-09-04 ENCOUNTER — Other Ambulatory Visit: Payer: Self-pay | Admitting: Family Medicine

## 2017-09-06 ENCOUNTER — Other Ambulatory Visit: Payer: Self-pay | Admitting: Family Medicine

## 2017-09-06 DIAGNOSIS — M47816 Spondylosis without myelopathy or radiculopathy, lumbar region: Secondary | ICD-10-CM

## 2017-10-11 ENCOUNTER — Encounter: Payer: Self-pay | Admitting: Family Medicine

## 2017-10-11 ENCOUNTER — Ambulatory Visit (INDEPENDENT_AMBULATORY_CARE_PROVIDER_SITE_OTHER): Payer: Medicare Other | Admitting: Family Medicine

## 2017-10-11 VITALS — BP 132/61 | HR 65 | Ht 65.0 in | Wt 275.0 lb

## 2017-10-11 DIAGNOSIS — L989 Disorder of the skin and subcutaneous tissue, unspecified: Secondary | ICD-10-CM | POA: Diagnosis not present

## 2017-10-11 DIAGNOSIS — I10 Essential (primary) hypertension: Secondary | ICD-10-CM | POA: Diagnosis not present

## 2017-10-11 DIAGNOSIS — E119 Type 2 diabetes mellitus without complications: Secondary | ICD-10-CM

## 2017-10-11 LAB — POCT GLYCOSYLATED HEMOGLOBIN (HGB A1C): Hemoglobin A1C: 6

## 2017-10-11 NOTE — Progress Notes (Signed)
Subjective:    CC: BP, HTN  HPI: Hypertension- Pt denies chest pain, SOB, dizziness, or heart palpitations.  Taking meds as directed w/o problems.  Denies medication side effects.    Diabetes - no hypoglycemic events. No wounds or sores that are not healing well. No increased thirst or urination. Checking glucose at home. Taking medications as prescribed without any side effects.  She also complains of a little spot on the right side of her nose.  She says is been there for several years.  Sometimes it feels a little sore to touch and occasionally gets scaly.   Past medical history, Surgical history, Family history not pertinant except as noted below, Social history, Allergies, and medications have been entered into the medical record, reviewed, and corrections made.  BP 132/61   Pulse 65   Ht 5\' 5"  (1.651 m)   Wt 275 lb (124.7 kg)   SpO2 95%   BMI 45.76 kg/m     Allergies  Allergen Reactions  . Latex Rash  . Tape Rash    Past Medical History:  Diagnosis Date  . Diabetes mellitus 2007  . Diverticulitis   . Hyperlipidemia   . Obesity     Past Surgical History:  Procedure Laterality Date  . ABDOMINAL HYSTERECTOMY  2008   fibroids  . BIOPSY BREAST  2007   benign calcification  . 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 Capsulotomy MPJ Right 12/31/2016   RT FOOT, 3rd MPJ  . FOOT SURGERY  2004   hammer toe  . Hammer Toe Repair Right 12/31/2016   RT #3, #5 toes    Social History   Socioeconomic History  . Marital status: Single    Spouse name: Not on file  . Number of children: 0  . Years of education: BA  . Highest education level: Not on file  Social Needs  . Financial resource strain: Not on file  . Food insecurity - worry: Not on file  . Food insecurity - inability: Not on file  . Transportation needs - medical: Not on file  . Transportation needs - non-medical: Not on file  Occupational History  . Occupation: Theatre manager:  [employer]    Comment: [employer].   Tobacco Use  . Smoking status: Never Smoker  . Smokeless tobacco: Never Used  Substance and Sexual Activity  . Alcohol use: No  . Drug use: No  . Sexual activity: Not Currently  Other Topics Concern  . Not on file  Social History Narrative   NO regular exercise.     Family History  Problem Relation Age of Onset  . Hypertension Mother   . Stroke Mother   . Diabetes Mother   . Kidney disease Father        kidney failure  . Heart disease Maternal Grandmother        MI  . Diabetes Maternal Grandmother   . Heart disease Mother 33  . Heart disease Father 98    Outpatient Encounter Medications as of 10/11/2017  Medication Sig  . AMBULATORY NON FORMULARY MEDICATION Medication Name: Glucometer.  STrips and lancets to test twice daily.   Dx 250.00.  Marland Kitchen AMBULATORY NON FORMULARY MEDICATION Medication Name: Tdap  . aspirin 81 MG tablet Take 81 mg by mouth daily.    . calcium carbonate (OS-CAL) 600 MG TABS Take 600 mg by mouth 2 (two) times daily with a meal.    . celecoxib (CELEBREX) 200  MG capsule TAKE 1 TO 2 CAPSULES BY MOUTH ONCE DAILY AS NEEDED FOR PAIN  . fish oil-omega-3 fatty acids 1000 MG capsule Take 2 g by mouth daily.    . furosemide (LASIX) 20 MG tablet TAKE 1 TABLET BY MOUTH ONCE DAILY  . glucose blood (ONE TOUCH ULTRA TEST) test strip Use to test twice daily.  Dx:E11.9  . levothyroxine (SYNTHROID, LEVOTHROID) 100 MCG tablet TAKE 1 TABLET BY MOUTH ONCE DAILY  . lisinopril (PRINIVIL,ZESTRIL) 20 MG tablet TAKE ONE TABLET BY MOUTH ONCE DAILY  . metFORMIN (GLUCOPHAGE) 500 MG tablet TAKE 1 TABLET BY MOUTH TWICE DAILY WITH MEALS  . Multiple Vitamin (THERA) TABS Take 1 tablet by mouth.  . simvastatin (ZOCOR) 20 MG tablet TAKE ONE TABLET BY MOUTH AT BEDTIME  . traMADol (ULTRAM) 50 MG tablet TAKE 1 TO 2 TABLETS BY MOUTH EVERY 8 HOURS AS NEEDED FOR PAIN, MAX OF 6 TABS DAILY  . Urea 40 % LOTN Apply 1 application topically 2 (two) times  daily as needed.   No facility-administered encounter medications on file as of 10/11/2017.        Objective:    General: Well Developed, well nourished, and in no acute distress.  Neuro: Alert and oriented x3, extra-ocular muscles intact, sensation grossly intact.  HEENT: Normocephalic, atraumatic  Skin: Warm and dry, no rashes.  He had a small erythematous area on the right side of the nose.  No distinct papule. Cardiac: Regular rate and rhythm, no murmurs rubs or gallops, no lower extremity edema.  Respiratory: Clear to auscultation bilaterally. Not using accessory muscles, speaking in full sentences.   Impression and Recommendations:    HTN - Well controlled. Continue current regimen. Follow up in  4 months.    DM - Well controlled. Continue current regimen. Follow up in  4 months. Continue statin and ACE inhibitor.   Lesion on nose-possible actinic keratosis.  Recommend cryotherapy after the holidays.  If lesion returns and recommend full biopsy.

## 2017-11-08 DIAGNOSIS — H524 Presbyopia: Secondary | ICD-10-CM | POA: Diagnosis not present

## 2017-11-08 DIAGNOSIS — H35371 Puckering of macula, right eye: Secondary | ICD-10-CM | POA: Diagnosis not present

## 2017-11-08 DIAGNOSIS — E119 Type 2 diabetes mellitus without complications: Secondary | ICD-10-CM | POA: Diagnosis not present

## 2017-11-10 LAB — HM DIABETES EYE EXAM

## 2017-11-17 ENCOUNTER — Other Ambulatory Visit: Payer: Self-pay | Admitting: Family Medicine

## 2017-11-28 ENCOUNTER — Other Ambulatory Visit: Payer: Self-pay | Admitting: Family Medicine

## 2017-11-28 DIAGNOSIS — M47816 Spondylosis without myelopathy or radiculopathy, lumbar region: Secondary | ICD-10-CM

## 2017-12-02 ENCOUNTER — Encounter: Payer: Self-pay | Admitting: Family Medicine

## 2018-01-11 ENCOUNTER — Other Ambulatory Visit: Payer: Self-pay | Admitting: *Deleted

## 2018-01-11 MED ORDER — FUROSEMIDE 20 MG PO TABS: 20.0000 mg | ORAL_TABLET | Freq: Every day | ORAL | 1 refills | Status: DC

## 2018-02-07 ENCOUNTER — Ambulatory Visit: Payer: Medicare Other | Admitting: Family Medicine

## 2018-02-13 ENCOUNTER — Ambulatory Visit: Payer: Medicare Other | Admitting: Family Medicine

## 2018-02-14 ENCOUNTER — Ambulatory Visit (INDEPENDENT_AMBULATORY_CARE_PROVIDER_SITE_OTHER): Payer: Medicare Other | Admitting: Family Medicine

## 2018-02-14 ENCOUNTER — Encounter: Payer: Self-pay | Admitting: Family Medicine

## 2018-02-14 VITALS — BP 132/54 | HR 64 | Ht 65.0 in | Wt 276.0 lb

## 2018-02-14 DIAGNOSIS — M47816 Spondylosis without myelopathy or radiculopathy, lumbar region: Secondary | ICD-10-CM

## 2018-02-14 DIAGNOSIS — I10 Essential (primary) hypertension: Secondary | ICD-10-CM | POA: Diagnosis not present

## 2018-02-14 DIAGNOSIS — E119 Type 2 diabetes mellitus without complications: Secondary | ICD-10-CM

## 2018-02-14 LAB — BASIC METABOLIC PANEL WITH GFR
BUN/Creatinine Ratio: 26 (calc) — ABNORMAL HIGH (ref 6–22)
BUN: 26 mg/dL — ABNORMAL HIGH (ref 7–25)
CO2: 28 mmol/L (ref 20–32)
Calcium: 9.7 mg/dL (ref 8.6–10.4)
Chloride: 106 mmol/L (ref 98–110)
Creat: 0.99 mg/dL (ref 0.50–0.99)
GFR, Est African American: 67 mL/min/{1.73_m2} (ref >=60)
GFR, Est Non African American: 58 mL/min/{1.73_m2} — ABNORMAL LOW (ref >=60)
Glucose, Bld: 108 mg/dL — ABNORMAL HIGH (ref 65–99)
Potassium: 4.5 mmol/L (ref 3.5–5.3)
Sodium: 141 mmol/L (ref 135–146)

## 2018-02-14 LAB — POCT GLYCOSYLATED HEMOGLOBIN (HGB A1C): Hemoglobin A1C: 6.1

## 2018-02-14 MED ORDER — CELECOXIB 200 MG PO CAPS: ORAL_CAPSULE | ORAL | 1 refills | Status: DC

## 2018-02-14 NOTE — Progress Notes (Signed)
Subjective:    CC: HTN, DM  HPI:  Hypertension- Pt denies chest pain, SOB, dizziness, or heart palpitations.  Taking meds as directed w/o problems.  Denies medication side effects.    Diabetes - no hypoglycemic events. No wounds or sores that are not healing well. No increased thirst or urination. Checking glucose at home. Taking medications as prescribed without any side effects.  Lumbar spondylosis - needs refill on her celebrex.  Tramadol refill last January.    Past medical history, Surgical history, Family history not pertinant except as noted below, Social history, Allergies, and medications have been entered into the medical record, reviewed, and corrections made.   Review of Systems: No fevers, chills, night sweats, weight loss, chest pain, or shortness of breath.   Objective:    General: Well Developed, well nourished, and in no acute distress.  Neuro: Alert and oriented x3, extra-ocular muscles intact, sensation grossly intact.  HEENT: Normocephalic, atraumatic  Skin: Warm and dry, no rashes. Cardiac: Regular rate and rhythm, no murmurs rubs or gallops, no lower extremity edema.  Respiratory: Clear to auscultation bilaterally. Not using accessory muscles, speaking in full sentences.   Impression and Recommendations:    HTN  - Well controlled. Continue current regimen. Follow up in  4 months.    DM - Well controlled. Continue current regimen. Follow up in  4 months. A1C of 6.1.    Lumbar spondylosis - refilled NSAID. Tramadol rx up to date.

## 2018-02-14 NOTE — Progress Notes (Signed)
All labs are normal. 

## 2018-03-02 ENCOUNTER — Other Ambulatory Visit: Payer: Self-pay | Admitting: Family Medicine

## 2018-03-08 ENCOUNTER — Other Ambulatory Visit: Payer: Self-pay | Admitting: Family Medicine

## 2018-03-08 DIAGNOSIS — M47816 Spondylosis without myelopathy or radiculopathy, lumbar region: Secondary | ICD-10-CM

## 2018-04-26 DIAGNOSIS — H35371 Puckering of macula, right eye: Secondary | ICD-10-CM | POA: Diagnosis not present

## 2018-05-12 ENCOUNTER — Other Ambulatory Visit: Payer: Self-pay | Admitting: Family Medicine

## 2018-05-19 ENCOUNTER — Other Ambulatory Visit: Payer: Self-pay | Admitting: *Deleted

## 2018-05-19 DIAGNOSIS — E119 Type 2 diabetes mellitus without complications: Secondary | ICD-10-CM

## 2018-05-19 MED ORDER — GLUCOSE BLOOD VI STRP: ORAL_STRIP | 4 refills | Status: DC

## 2018-06-07 ENCOUNTER — Other Ambulatory Visit: Payer: Self-pay | Admitting: Family Medicine

## 2018-06-07 DIAGNOSIS — M47816 Spondylosis without myelopathy or radiculopathy, lumbar region: Secondary | ICD-10-CM

## 2018-06-13 ENCOUNTER — Other Ambulatory Visit: Payer: Self-pay | Admitting: Family Medicine

## 2018-06-13 ENCOUNTER — Encounter: Payer: Self-pay | Admitting: Family Medicine

## 2018-06-13 ENCOUNTER — Ambulatory Visit (INDEPENDENT_AMBULATORY_CARE_PROVIDER_SITE_OTHER): Payer: Medicare Other | Admitting: Family Medicine

## 2018-06-13 VITALS — BP 136/51 | HR 62 | Ht 65.0 in | Wt 277.0 lb

## 2018-06-13 DIAGNOSIS — Z1231 Encounter for screening mammogram for malignant neoplasm of breast: Secondary | ICD-10-CM

## 2018-06-13 DIAGNOSIS — E039 Hypothyroidism, unspecified: Secondary | ICD-10-CM | POA: Diagnosis not present

## 2018-06-13 DIAGNOSIS — E119 Type 2 diabetes mellitus without complications: Secondary | ICD-10-CM | POA: Diagnosis not present

## 2018-06-13 DIAGNOSIS — I10 Essential (primary) hypertension: Secondary | ICD-10-CM | POA: Diagnosis not present

## 2018-06-13 LAB — COMPLETE METABOLIC PANEL WITH GFR
AG Ratio: 2 (calc) (ref 1.0–2.5)
ALT: 14 U/L (ref 6–29)
AST: 18 U/L (ref 10–35)
Albumin: 4.3 g/dL (ref 3.6–5.1)
Alkaline phosphatase (APISO): 89 U/L (ref 33–130)
BUN/Creatinine Ratio: 25 (calc) — ABNORMAL HIGH (ref 6–22)
BUN: 27 mg/dL — ABNORMAL HIGH (ref 7–25)
CO2: 27 mmol/L (ref 20–32)
Calcium: 9.4 mg/dL (ref 8.6–10.4)
Chloride: 104 mmol/L (ref 98–110)
Creat: 1.09 mg/dL — ABNORMAL HIGH (ref 0.50–0.99)
GFR, Est African American: 60 mL/min/{1.73_m2} (ref >=60)
GFR, Est Non African American: 52 mL/min/{1.73_m2} — ABNORMAL LOW (ref >=60)
Globulin: 2.2 g/dL (calc) (ref 1.9–3.7)
Glucose, Bld: 106 mg/dL — ABNORMAL HIGH (ref 65–99)
Potassium: 5 mmol/L (ref 3.5–5.3)
Sodium: 141 mmol/L (ref 135–146)
Total Bilirubin: 0.4 mg/dL (ref 0.2–1.2)
Total Protein: 6.5 g/dL (ref 6.1–8.1)

## 2018-06-13 LAB — LIPID PANEL
Cholesterol: 168 mg/dL (ref <200)
HDL: 71 mg/dL (ref >50)
LDL Cholesterol (Calc): 82 mg/dL (calc)
Non-HDL Cholesterol (Calc): 97 mg/dL (calc) (ref <130)
Total CHOL/HDL Ratio: 2.4 (calc) (ref <5.0)
Triglycerides: 73 mg/dL (ref <150)

## 2018-06-13 LAB — POCT UA - MICROALBUMIN
Albumin/Creatinine Ratio, Urine, POC: <30
Creatinine, POC: 50 mg/dL
Microalbumin Ur, POC: 10 mg/L

## 2018-06-13 LAB — POCT GLYCOSYLATED HEMOGLOBIN (HGB A1C): Hemoglobin A1C: 5.8 % — AB (ref 4.0–5.6)

## 2018-06-13 LAB — TSH: TSH: 1.54 mIU/L (ref 0.40–4.50)

## 2018-06-13 NOTE — Patient Instructions (Signed)
Please schedule your mammogram!

## 2018-06-13 NOTE — Progress Notes (Signed)
Subjective:    CC: BP, DM, and thyroid.   HPI:  Hypertension- Pt denies chest pain, SOB, dizziness, or heart palpitations.  Taking meds as directed w/o problems.  Denies medication side effects.    Diabetes - no hypoglycemic events. No wounds or sores that are not healing well. No increased thirst or urination. Checking glucose at home. Taking medications as prescribed without any side effects. Last A1C of 6.1  Hypothyroidism - Taking medication regularly in the AM away from food and vitamins, etc. No recent change to skin, hair, or energy levels.   Past medical history, Surgical history, Family history not pertinant except as noted below, Social history, Allergies, and medications have been entered into the medical record, reviewed, and corrections made.   Review of Systems: No fevers, chills, night sweats, weight loss, chest pain, or shortness of breath.   Objective:    General: Well Developed, well nourished, and in no acute distress.  Neuro: Alert and oriented x3, extra-ocular muscles intact, sensation grossly intact.  HEENT: Normocephalic, atraumatic  Skin: Warm and dry, no rashes. Cardiac: Regular rate and rhythm, no murmurs rubs or gallops, no lower extremity edema.  Respiratory: Clear to auscultation bilaterally. Not using accessory muscles, speaking in full sentences.   Impression and Recommendations:    HTN - Well controlled. Continue current regimen. Follow up in  3-4 months.    DM - On ACE, statin and ASA grade well controlled.  Heme globin A1c of 5.8 today.  We will perform urine microalbumin today.  Hypothyroidism -due to recheck TSH levels.  She feels well controlled on her current regimen.  Encouraged her to schedule her mammogram.

## 2018-06-23 ENCOUNTER — Other Ambulatory Visit: Payer: Self-pay | Admitting: Family Medicine

## 2018-06-23 DIAGNOSIS — E785 Hyperlipidemia, unspecified: Secondary | ICD-10-CM

## 2018-06-29 ENCOUNTER — Ambulatory Visit: Payer: Medicare Other

## 2018-06-30 ENCOUNTER — Ambulatory Visit (INDEPENDENT_AMBULATORY_CARE_PROVIDER_SITE_OTHER): Payer: Medicare Other

## 2018-06-30 DIAGNOSIS — Z1231 Encounter for screening mammogram for malignant neoplasm of breast: Secondary | ICD-10-CM

## 2018-07-12 ENCOUNTER — Other Ambulatory Visit: Payer: Self-pay | Admitting: Family Medicine

## 2018-08-30 ENCOUNTER — Other Ambulatory Visit: Payer: Self-pay | Admitting: Family Medicine

## 2018-08-30 DIAGNOSIS — M47816 Spondylosis without myelopathy or radiculopathy, lumbar region: Secondary | ICD-10-CM

## 2018-09-02 DIAGNOSIS — Z23 Encounter for immunization: Secondary | ICD-10-CM | POA: Diagnosis not present

## 2018-09-07 ENCOUNTER — Other Ambulatory Visit: Payer: Self-pay | Admitting: Family Medicine

## 2018-09-07 DIAGNOSIS — M47816 Spondylosis without myelopathy or radiculopathy, lumbar region: Secondary | ICD-10-CM

## 2018-09-07 NOTE — Progress Notes (Signed)
Subjective:   Regina Houston is a 67 y.o. female who presents for Medicare Annual (Subsequent) preventive examination.  Review of Systems:  No ROS.  Medicare Wellness Visit. Additional risk factors are reflected in the social history.  Cardiac Risk Factors include: dyslipidemia;hypertension;obesity (BMI >30kg/m2);diabetes mellitus Sleep patterns: Getting 6-7 hours of sleep a night. Feels rested upon waking up. Occasionally will get up 1 time a night to void.   Home Safety/Smoke Alarms: Feels safe in home. Smoke alarms in place.  Living environment; Lives alone in 1 story home. No steps. Shower is a step over tub and no grab rails in place and discussed with patient to try and get these installed.    Female:   Pap- aged out     [place]- utd       Dexa scan- utd       [facility]- utd Eye Exam- utd     Objective:     Vitals: BP 115/62   Pulse 62   Ht 5\' 5"  (1.651 m)   Wt 275 lb (124.7 kg)   SpO2 98%   BMI 45.76 kg/m   Body mass index is 45.76 kg/m.  Advanced Directives 09/12/2018 06/15/2016 03/04/2016 04/03/2015 01/22/2015 09/25/2014 04/26/2014  Does Patient Have a Medical Advance Directive? Yes No Yes Yes Yes Yes Patient has advance directive, copy not in chart;Patient would not like information  Type of Advance Directive [place];Living will - - Living will [place];Living will Living will -  Does patient want to make changes to medical advance directive? No - Patient declined - - - - No - Patient declined -  Copy of [place] in Chart? No - copy requested - No - copy requested No - copy requested Yes No - copy requested -  Would patient like information on creating a medical advance directive? - No - patient declined information - - - - -    Tobacco Social History   Tobacco Use  Smoking Status Never Smoker  Smokeless Tobacco Never Used     Counseling given: Not Answered   Clinical Intake:  Pre-visit preparation completed:  Yes  Pain : No/denies pain(low back pain. has bulging disc and aggravated from working in the yard)     Nutritional Risks: None Diabetes: Yes CBG done?: No Did pt. bring in CBG monitor from home?: No  Patient test sugars at home 2 times a day. This morning it was 106 which is usually where it stays.  How often do you need to have someone help you when you read instructions, pamphlets, or other written materials from your doctor or pharmacy?: 1 - Never What is the last grade level you completed in school?: 16 years  Interpreter Needed?: No  Information entered by :: Lowry Ram  Past Medical History:  Diagnosis Date  . Diabetes mellitus 2007  . Diverticulitis   . Hyperlipidemia   . Obesity    Past Surgical History:  Procedure Laterality Date  . ABDOMINAL HYSTERECTOMY  2008   fibroids  . BIOPSY BREAST  2007   benign calcification  . 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 BREAST BIOPSY Left    needle core biopsy, benign  . Capsulotomy MPJ Right 12/31/2016   RT FOOT, 3rd MPJ  . FOOT SURGERY  2004   hammer toe  . Hammer Toe Repair Right 12/31/2016   RT #3, #5 toes   Family History  Problem Relation Age of Onset  .  Hypertension Mother   . Stroke Mother   . Diabetes Mother   . Heart disease Mother 67  . Kidney disease Father        kidney failure  . Heart disease Father 28  . Heart disease Maternal Grandmother        MI  . Diabetes Maternal Grandmother    Social History   Socioeconomic History  . Marital status: Single    Spouse name: Not on file  . Number of children: 0  . Years of education: BA  . Highest education level: Bachelor's degree (e.g., BA, AB, BS)  Occupational History  . 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   Social Needs  . Financial resource strain: Not hard at all  . Food insecurity:    Worry: Never true    Inability: Never true  . Transportation needs:    Medical: No     Non-medical: No  Tobacco Use  . Smoking status: Never Smoker  . Smokeless tobacco: Never Used  Substance and Sexual Activity  . Alcohol use: No  . Drug use: No  . Sexual activity: Not Currently  Lifestyle  . Physical activity:    Days per week: 2 days    Minutes per session: 30 min  . Stress: Not at all  Relationships  . Social connections:    Talks on phone: More than three times a week    Gets together: Twice a week    Attends religious service: More than 4 times per year    Active member of club or organization: No    Attends meetings of clubs or organizations: Never    Relationship status: Not on file  Other Topics Concern  . Not on file  Social History Narrative   Walks  2 times a week for 30 minutes a day. Also works part time at [employer] in [employer].    Outpatient Encounter Medications as of 09/12/2018  Medication Sig  . AMBULATORY NON FORMULARY MEDICATION Medication Name: Glucometer.  STrips and lancets to test twice daily.   Dx 250.00.  Marland Kitchen aspirin 81 MG tablet Take 81 mg by mouth daily.    . calcium carbonate (OS-CAL) 600 MG TABS Take 600 mg by mouth 2 (two) times daily with a meal.    . celecoxib (CELEBREX) 200 MG capsule TAKE 1 TO 2 CAPSULES BY MOUTH ONCE DAILY AS NEEDED FOR PAIN  . fish oil-omega-3 fatty acids 1000 MG capsule Take 2 g by mouth daily.    . furosemide (LASIX) 20 MG tablet TAKE 1 TABLET BY MOUTH ONCE DAILY  . glucose blood (ONE TOUCH ULTRA TEST) test strip USE 1 STRIP TO CHECK GLUCOSE TWICE DAILY. Dx: E11.9  . levothyroxine (SYNTHROID, LEVOTHROID) 100 MCG tablet TAKE 1 TABLET BY MOUTH ONCE DAILY  . lisinopril (PRINIVIL,ZESTRIL) 20 MG tablet TAKE 1 TABLET BY MOUTH ONCE DAILY  . metFORMIN (GLUCOPHAGE) 500 MG tablet TAKE 1 TABLET BY MOUTH TWICE DAILY WITH MEALS  . Multiple Vitamin (THERA) TABS Take 1 tablet by mouth.  . simvastatin (ZOCOR) 20 MG tablet TAKE 1 TABLET BY MOUTH AT BEDTIME  . traMADol (ULTRAM) 50 MG tablet TAKE 1 TO 2 TABLETS BY MOUTH  EVERY 8 HOURS AS NEEDED FOR PAIN . DO NOT EXCEED 6 PER 24 HOURS  . Urea 40 % LOTN Apply 1 application topically 2 (two) times daily as needed. (Patient not taking: Reported on 09/12/2018)  . [DISCONTINUED] traMADol (ULTRAM) 50 MG  tablet TAKE 1 TO 2 TABLETS BY MOUTH EVERY 8 HOURS AS NEEDED FOR PAIN **MAX  6  TABLETS  PER  DAY**   No facility-administered encounter medications on file as of 09/12/2018.     Activities of Daily Living In your present state of health, do you have any difficulty performing the following activities: 09/12/2018  Hearing? N  Vision? N  Difficulty concentrating or making decisions? N  Dressing or bathing? N  Doing errands, shopping? N  Preparing Food and eating ? N  Using the Toilet? N  In the past six months, have you accidently leaked urine? N  Do you have problems with loss of bowel control? N  Managing your Medications? N  Managing your Finances? N  Housekeeping or managing your Housekeeping? N  Some recent data might be hidden    Patient Care Team: Hali Marry, MD as PCP - General    Assessment:   This is a routine wellness examination for Kebrina. Physical assessment deferred to PCP.   Exercise Activities and Dietary recommendations Current Exercise Habits: Home exercise routine, Type of exercise: walking;strength training/weights, Time (Minutes): 30, Frequency (Times/Week): 2, Weekly Exercise (Minutes/Week): 60, Intensity: Mild, Exercise limited by: None identified Diet eats somewhat a healthy diet. Does eat fruit and tries to eat her vegetables Breakfast: grapefruit or bacon and eggs Lunch: chinese if at work. At home will eat a salad Dinner:  Leftovers.  Drinks 64 ounces of water daily.   Goals    . Exercise 150 min/wk Moderate Activity     Start strength training more. Continue to do brain stimulating exercises like your crosswords.       Fall Risk Fall Risk  09/12/2018 10/11/2017 02/08/2017 06/15/2016 05/18/2016  Falls in the past  year? No Yes No Yes Yes  Number falls in past yr: - 1 - 1 1  Injury with Fall? - No - No No  Risk for fall due to : - Other (Comment) - - -  Follow up - Education provided - Falls prevention discussed Falls prevention discussed   Is the patient's home free of loose throw rugs in walkways, pet beds, electrical cords, etc?   yes      Grab bars in the bathroom? no      Handrails on the stairs?   no      Adequate lighting?   yes  Depression Screen PHQ 2/9 Scores 09/12/2018 06/13/2018 10/11/2017 02/08/2017  PHQ - 2 Score 0 1 0 0     Cognitive Function     6CIT Screen 09/12/2018  What Year? 0 points  What month? 0 points  What time? 0 points  Count back from 20 0 points  Months in reverse 0 points  Repeat phrase 0 points  Total Score 0    Immunization History  Administered Date(s) Administered  . DTP 11/22/2006  . Influenza Split 10/10/2012  . Influenza Whole 11/22/2006, 10/01/2009, 08/22/2010  . Influenza,inj,Quad PF,6+ Mos 08/28/2014, 08/26/2015, 08/10/2016  . Influenza-Unspecified 10/01/2013, 09/02/2017, 09/02/2018  . Pneumococcal Conjugate-13 02/19/2014  . Pneumococcal Polysaccharide-23 11/22/2005, 04/03/2015  . Td 11/22/2006  . Tdap 02/22/2017  . Zoster 05/04/2011    Screening Tests Health Maintenance  Topic Date Due  . OPHTHALMOLOGY EXAM  11/10/2018  . HEMOGLOBIN A1C  12/14/2018  . FOOT EXAM  02/15/2019  . DEXA SCAN  06/12/2019  . COLONOSCOPY  09/22/2019  . MAMMOGRAM  06/30/2020  . TETANUS/TDAP  02/23/2027  . INFLUENZA VACCINE  Completed  . Hepatitis C Screening  Completed  . PNA vac Low Risk Adult  Completed       Plan:  Please schedule your next medicare wellness visit with me in 1 yr.  Ms. Hibner , Thank you for taking time to come for your Medicare Wellness Visit. I appreciate your ongoing commitment to your health goals. Please review the following plan we discussed and let me know if I can assist you in the future.  Continue doing brain stimulating  activities (puzzles, reading, adult coloring books, staying active) to keep memory sharp.   Bring a copy of your living will and/or healthcare power of attorney to your next office visit.    These are the goals we discussed: Goals    . Exercise 150 min/wk Moderate Activity     Start strength training more. Continue to do brain stimulating exercises like your crosswords.       This is a list of the screening recommended for you and due dates:  Health Maintenance  Topic Date Due  . Eye exam for diabetics  11/10/2018  . Hemoglobin A1C  12/14/2018  . Complete foot exam   02/15/2019  . DEXA scan (bone density measurement)  06/12/2019  . Colon Cancer Screening  09/22/2019  . Mammogram  06/30/2020  . Tetanus Vaccine  02/23/2027  . Flu Shot  Completed  .  Hepatitis C: One time screening is recommended by Center for Disease Control  (CDC) for  adults born from [birth year] through 1967.   Completed  . Pneumonia vaccines  Completed    Bring a copy of your living will and/or healthcare power of attorney to your next office visit.   These are the goals we discussed: Goals    . Exercise 150 min/wk Moderate Activity     Start strength training more. Continue to do brain stimulating exercises like your crosswords.       This is a list of the screening recommended for you and due dates:  Health Maintenance  Topic Date Due  . Eye exam for diabetics  11/10/2018  . Hemoglobin A1C  12/14/2018  . Complete foot exam   02/15/2019  . DEXA scan (bone density measurement)  06/12/2019  . Colon Cancer Screening  09/22/2019  . Mammogram  06/30/2020  . Tetanus Vaccine  02/23/2027  . Flu Shot  Completed  .  Hepatitis C: One time screening is recommended by Center for Disease Control  (CDC) for  adults born from [birth year] through 1967.   Completed  . Pneumonia vaccines  Completed     I have personally reviewed and noted the following in the patient's chart:   . Medical and social history . Use of  alcohol, tobacco or illicit drugs  . Current medications and supplements . Functional ability and status . Nutritional status . Physical activity . Advanced directives . List of other physicians . Hospitalizations, surgeries, and ER visits in previous 12 months . Vitals . Screenings to include cognitive, depression, and falls . Referrals and appointments  In addition, I have reviewed and discussed with patient certain preventive protocols, quality metrics, and best practice recommendations. A written personalized care plan for preventive services as well as general preventive health recommendations were provided to patient.     Joanne Chars, LPN  [id number]

## 2018-09-12 ENCOUNTER — Ambulatory Visit (INDEPENDENT_AMBULATORY_CARE_PROVIDER_SITE_OTHER): Payer: Medicare Other | Admitting: *Deleted

## 2018-09-12 VITALS — BP 115/62 | HR 62 | Ht 65.0 in | Wt 275.0 lb

## 2018-09-12 DIAGNOSIS — Z0000 Encounter for general adult medical examination without abnormal findings: Secondary | ICD-10-CM | POA: Diagnosis not present

## 2018-09-12 NOTE — Patient Instructions (Signed)
Please schedule your next medicare wellness visit with me in 1 yr.  Regina Houston , Thank you for taking time to come for your Medicare Wellness Visit. I appreciate your ongoing commitment to your health goals. Please review the following plan we discussed and let me know if I can assist you in the future.  Continue doing brain stimulating activities (puzzles, reading, adult coloring books, staying active) to keep memory sharp.   Bring a copy of your living will and/or healthcare power of attorney to your next office visit.    These are the goals we discussed: Goals    . Exercise 150 min/wk Moderate Activity     Start strength training more. Continue to do brain stimulating exercises like your crosswords.     Health Maintenance for Postmenopausal Women Menopause is a normal process in which your reproductive ability comes to an end. This process happens gradually over a span of months to years, usually between the ages of 17 and 38. Menopause is complete when you have missed 12 consecutive menstrual periods. It is important to talk with your health care provider about some of the most common conditions that affect postmenopausal women, such as heart disease, cancer, and bone loss (osteoporosis). Adopting a healthy lifestyle and getting preventive care can help to promote your health and wellness. Those actions can also lower your chances of developing some of these common conditions. What should I know about menopause? During menopause, you may experience a number of symptoms, such as:  Moderate-to-severe hot flashes.  Night sweats.  Decrease in sex drive.  Mood swings.  Headaches.  Tiredness.  Irritability.  Memory problems.  Insomnia.  Choosing to treat or not to treat menopausal changes is an individual decision that you make with your health care provider. What should I know about hormone replacement therapy and supplements? Hormone therapy products are effective for  treating symptoms that are associated with menopause, such as hot flashes and night sweats. Hormone replacement carries certain risks, especially as you become older. If you are thinking about using estrogen or estrogen with progestin treatments, discuss the benefits and risks with your health care provider. What should I know about heart disease and stroke? Heart disease, heart attack, and stroke become more likely as you age. This may be due, in part, to the hormonal changes that your body experiences during menopause. These can affect how your body processes dietary fats, triglycerides, and cholesterol. Heart attack and stroke are both medical emergencies. There are many things that you can do to help prevent heart disease and stroke:  Have your blood pressure checked at least every 1-2 years. High blood pressure causes heart disease and increases the risk of stroke.  If you are 30-15 years old, ask your health care provider if you should take aspirin to prevent a heart attack or a stroke.  Do not use any tobacco products, including cigarettes, chewing tobacco, or electronic cigarettes. If you need help quitting, ask your health care provider.  It is important to eat a healthy diet and maintain a healthy weight. ? Be sure to include plenty of vegetables, fruits, low-fat dairy products, and lean protein. ? Avoid eating foods that are high in solid fats, added sugars, or salt (sodium).  Get regular exercise. This is one of the most important things that you can do for your health. ? Try to exercise for at least 150 minutes each week. The type of exercise that you do should increase your heart rate and  make you sweat. This is known as moderate-intensity exercise. ? Try to do strengthening exercises at least twice each week. Do these in addition to the moderate-intensity exercise.  Know your numbers.Ask your health care provider to check your cholesterol and your blood glucose. Continue to have  your blood tested as directed by your health care provider.  What should I know about cancer screening? There are several types of cancer. Take the following steps to reduce your risk and to catch any cancer development as early as possible. Breast Cancer  Practice breast self-awareness. ? This means understanding how your breasts normally appear and feel. ? It also means doing regular breast self-exams. Let your health care provider know about any changes, no matter how small.  If you are 74 or older, have a clinician do a breast exam (clinical breast exam or CBE) every year. Depending on your age, family history, and medical history, it may be recommended that you also have a yearly breast X-ray (mammogram).  If you have a family history of breast cancer, talk with your health care provider about genetic screening.  If you are at high risk for breast cancer, talk with your health care provider about having an MRI and a mammogram every year.  Breast cancer (BRCA) gene test is recommended for women who have family members with BRCA-related cancers. Results of the assessment will determine the need for genetic counseling and BRCA1 and for BRCA2 testing. BRCA-related cancers include these types: ? Breast. This occurs in males or females. ? Ovarian. ? Tubal. This may also be called fallopian tube cancer. ? Cancer of the abdominal or pelvic lining (peritoneal cancer). ? Prostate. ? Pancreatic.  Cervical, Uterine, and Ovarian Cancer Your health care provider may recommend that you be screened regularly for cancer of the pelvic organs. These include your ovaries, uterus, and vagina. This screening involves a pelvic exam, which includes checking for microscopic changes to the surface of your cervix (Pap test).  For women ages 21-65, health care providers may recommend a pelvic exam and a Pap test every three years. For women ages 59-65, they may recommend the Pap test and pelvic exam, combined  with testing for human papilloma virus (HPV), every five years. Some types of HPV increase your risk of cervical cancer. Testing for HPV may also be done on women of any age who have unclear Pap test results.  Other health care providers may not recommend any screening for nonpregnant women who are considered low risk for pelvic cancer and have no symptoms. Ask your health care provider if a screening pelvic exam is right for you.  If you have had past treatment for cervical cancer or a condition that could lead to cancer, you need Pap tests and screening for cancer for at least 20 years after your treatment. If Pap tests have been discontinued for you, your risk factors (such as having a new sexual partner) need to be reassessed to determine if you should start having screenings again. Some women have medical problems that increase the chance of getting cervical cancer. In these cases, your health care provider may recommend that you have screening and Pap tests more often.  If you have a family history of uterine cancer or ovarian cancer, talk with your health care provider about genetic screening.  If you have vaginal bleeding after reaching menopause, tell your health care provider.  There are currently no reliable tests available to screen for ovarian cancer.  Lung Cancer Lung cancer  screening is recommended for adults 36-30 years old who are at high risk for lung cancer because of a history of smoking. A yearly low-dose CT scan of the lungs is recommended if you:  Currently smoke.  Have a history of at least 30 pack-years of smoking and you currently smoke or have quit within the past 15 years. A pack-year is smoking an average of one pack of cigarettes per day for one year.  Yearly screening should:  Continue until it has been 15 years since you quit.  Stop if you develop a health problem that would prevent you from having lung cancer treatment.  Colorectal Cancer  This type of  cancer can be detected and can often be prevented.  Routine colorectal cancer screening usually begins at age 56 and continues through age 76.  If you have risk factors for colon cancer, your health care provider may recommend that you be screened at an earlier age.  If you have a family history of colorectal cancer, talk with your health care provider about genetic screening.  Your health care provider may also recommend using home test kits to check for hidden blood in your stool.  A small camera at the end of a tube can be used to examine your colon directly (sigmoidoscopy or colonoscopy). This is done to check for the earliest forms of colorectal cancer.  Direct examination of the colon should be repeated every 5-10 years until age 71. However, if early forms of precancerous polyps or small growths are found or if you have a family history or genetic risk for colorectal cancer, you may need to be screened more often.  Skin Cancer  Check your skin from head to toe regularly.  Monitor any moles. Be sure to tell your health care provider: ? About any new moles or changes in moles, especially if there is a change in a mole's shape or color. ? If you have a mole that is larger than the size of a pencil eraser.  If any of your family members has a history of skin cancer, especially at a young age, talk with your health care provider about genetic screening.  Always use sunscreen. Apply sunscreen liberally and repeatedly throughout the day.  Whenever you are outside, protect yourself by wearing long sleeves, pants, a wide-brimmed hat, and sunglasses.  What should I know about osteoporosis? Osteoporosis is a condition in which bone destruction happens more quickly than new bone creation. After menopause, you may be at an increased risk for osteoporosis. To help prevent osteoporosis or the bone fractures that can happen because of osteoporosis, the following is recommended:  If you are  21-110 years old, get at least 1,000 mg of calcium and at least 600 mg of vitamin D per day.  If you are older than age 10 but younger than age 45, get at least 1,200 mg of calcium and at least 600 mg of vitamin D per day.  If you are older than age 65, get at least 1,200 mg of calcium and at least 800 mg of vitamin D per day.  Smoking and excessive alcohol intake increase the risk of osteoporosis. Eat foods that are rich in calcium and vitamin D, and do weight-bearing exercises several times each week as directed by your health care provider. What should I know about how menopause affects my mental health? Depression may occur at any age, but it is more common as you become older. Common symptoms of depression include:  Low  or sad mood.  Changes in sleep patterns.  Changes in appetite or eating patterns.  Feeling an overall lack of motivation or enjoyment of activities that you previously enjoyed.  Frequent crying spells.  Talk with your health care provider if you think that you are experiencing depression. What should I know about immunizations? It is important that you get and maintain your immunizations. These include:  Tetanus, diphtheria, and pertussis (Tdap) booster vaccine.  Influenza every year before the flu season begins.  Pneumonia vaccine.  Shingles vaccine.  Your health care provider may also recommend other immunizations. This information is not intended to replace advice given to you by your health care provider. Make sure you discuss any questions you have with your health care provider. Document Released: 12/31/2005 Document Revised: 05/28/2016 Document Reviewed: 08/12/2015 Elsevier Interactive Patient Education  2018 [employer].

## 2018-10-17 ENCOUNTER — Encounter: Payer: Self-pay | Admitting: Family Medicine

## 2018-10-17 ENCOUNTER — Ambulatory Visit (INDEPENDENT_AMBULATORY_CARE_PROVIDER_SITE_OTHER): Payer: Medicare Other | Admitting: Family Medicine

## 2018-10-17 VITALS — BP 132/60 | HR 67 | Ht 63.43 in | Wt 272.0 lb

## 2018-10-17 DIAGNOSIS — I10 Essential (primary) hypertension: Secondary | ICD-10-CM

## 2018-10-17 DIAGNOSIS — E119 Type 2 diabetes mellitus without complications: Secondary | ICD-10-CM

## 2018-10-17 LAB — POCT GLYCOSYLATED HEMOGLOBIN (HGB A1C): Hemoglobin A1C: 6.1 % — AB (ref 4.0–5.6)

## 2018-10-17 NOTE — Progress Notes (Signed)
Subjective:    CC: DM, HTN  HPI:  Diabetes - no hypoglycemic events. No wounds or sores that are not healing well. No increased thirst or urination. Checking glucose at home. Taking medications as prescribed without any side effects.  Hypertension- Pt denies chest pain, SOB, dizziness, or heart palpitations.  Taking meds as directed w/o problems.  Denies medication side effects.      Past medical history, Surgical history, Family history not pertinant except as noted below, Social history, Allergies, and medications have been entered into the medical record, reviewed, and corrections made.   Review of Systems: No fevers, chills, night sweats, weight loss, chest pain, or shortness of breath.   Objective:    General: Well Developed, well nourished, and in no acute distress.  Neuro: Alert and oriented x3, extra-ocular muscles intact, sensation grossly intact.  HEENT: Normocephalic, atraumatic  Skin: Warm and dry, no rashes. Cardiac: Regular rate and rhythm, no murmurs rubs or gallops, no lower extremity edema.  Respiratory: Clear to auscultation bilaterally. Not using accessory muscles, speaking in full sentences.   Impression and Recommendations:    HTN - Well controlled. Continue current regimen. Follow up in  4 months.    DM - Well controlled. Continue current regimen. Follow up in  4 months.    Encouraged to schedule a Shingrix vaccine at the pharmacy.

## 2018-11-08 ENCOUNTER — Other Ambulatory Visit: Payer: Self-pay | Admitting: Family Medicine

## 2018-11-08 DIAGNOSIS — H35371 Puckering of macula, right eye: Secondary | ICD-10-CM | POA: Diagnosis not present

## 2018-11-08 DIAGNOSIS — H524 Presbyopia: Secondary | ICD-10-CM | POA: Diagnosis not present

## 2018-11-08 LAB — HM DIABETES EYE EXAM

## 2018-11-23 ENCOUNTER — Encounter: Payer: Self-pay | Admitting: Family Medicine

## 2018-12-12 ENCOUNTER — Other Ambulatory Visit: Payer: Self-pay | Admitting: Family Medicine

## 2018-12-12 DIAGNOSIS — M47816 Spondylosis without myelopathy or radiculopathy, lumbar region: Secondary | ICD-10-CM

## 2018-12-12 NOTE — Telephone Encounter (Signed)
Routing to pcp for signature.Aldo Sondgeroth Lynetta, CMA  

## 2019-01-10 ENCOUNTER — Other Ambulatory Visit: Payer: Self-pay | Admitting: Family Medicine

## 2019-02-15 ENCOUNTER — Other Ambulatory Visit: Payer: Self-pay

## 2019-02-15 ENCOUNTER — Encounter: Payer: Self-pay | Admitting: Family Medicine

## 2019-02-15 ENCOUNTER — Ambulatory Visit (INDEPENDENT_AMBULATORY_CARE_PROVIDER_SITE_OTHER): Payer: Medicare Other | Admitting: Family Medicine

## 2019-02-15 VITALS — BP 119/67 | HR 61 | Temp 98.0°F | Ht 63.0 in | Wt 266.0 lb

## 2019-02-15 DIAGNOSIS — R05 Cough: Secondary | ICD-10-CM

## 2019-02-15 DIAGNOSIS — I10 Essential (primary) hypertension: Secondary | ICD-10-CM

## 2019-02-15 DIAGNOSIS — R059 Cough, unspecified: Secondary | ICD-10-CM

## 2019-02-15 DIAGNOSIS — E119 Type 2 diabetes mellitus without complications: Secondary | ICD-10-CM | POA: Diagnosis not present

## 2019-02-15 NOTE — Progress Notes (Signed)
BS was 120 this morning. Pt informed me that she has had a hacky cough x 1 wk denies f/s/c. She has been using tylenol,robintussin, and cough drops.Elouise Munroe, [place]

## 2019-02-15 NOTE — Progress Notes (Signed)
Virtual Visit via Telephone Note  I connected with Regina Houston on 02/15/19 at  8:50 AM EDT by telephone and verified that I am speaking with the correct person using two identifiers.   I discussed the limitations, risks, security and privacy concerns of performing an evaluation and management service by telephone and the availability of in person appointments. I also discussed with the patient that there may be a patient responsible charge related to this service. The patient expressed understanding and agreed to proceed.  Subjective:    CC: BP check.   Today's Vitals   02/15/19 0808  BP: 119/67  Pulse: 61  Temp: 98 F (36.7 C)  Weight: 266 lb (120.7 kg)  Height: 5\' 3"  (1.6 m)   Body mass index is 47.12 kg/m.   HPI: Hypertension- Pt denies chest pain, SOB, dizziness, or heart palpitations.  Taking meds as directed w/o problems.  Denies medication side effects.  No swelling.    Diabetes - no hypoglycemic events. No wounds or sores that are not healing well. No increased thirst or urination. Checking glucose at home. Taking medications as prescribed without any side effects.  She has had a hacky cough x 1 wk denies fever/s/c. She has been using tylenol,robintussin, and cough drops. + clear runny nose.  Mild scratchy throat.  Not on allergy medication. No SOB.  No GI sxs.    Says notices a occ dry tickling cough.   Past medical history, Surgical history, Family history not pertinant except as noted below, Social history, Allergies, and medications have been entered into the medical record, reviewed, and corrections made.   Review of Systems: No fevers, chills, night sweats, weight loss, chest pain, or shortness of breath.   Objective:    General: Alert and oriented.  No increased work of breathing.  Speaking clearly.     Impression and Recommendations:    HTN - Well controlled. Continue current regimen. Follow up in 3- 4 months.  DM - she has been doing well with her diet.     Cough - Likely allergies. She is feeling some better.  On ACE and statin.    Dry tickling cough consider could be the ACE causing it.     I discussed the assessment and treatment plan with the patient. The patient was provided an opportunity to ask questions and all were answered. The patient agreed with the plan and demonstrated an understanding of the instructions.   The patient was advised to call back or seek an in-person evaluation if the symptoms worsen or if the condition fails to improve as anticipated.  I provided 15 minutes of non-face-to-face time during this encounter.   [name], MD

## 2019-03-06 ENCOUNTER — Other Ambulatory Visit: Payer: Self-pay | Admitting: Family Medicine

## 2019-03-06 DIAGNOSIS — M47816 Spondylosis without myelopathy or radiculopathy, lumbar region: Secondary | ICD-10-CM

## 2019-03-14 ENCOUNTER — Other Ambulatory Visit: Payer: Self-pay | Admitting: Family Medicine

## 2019-04-19 ENCOUNTER — Other Ambulatory Visit: Payer: Self-pay | Admitting: Family Medicine

## 2019-05-02 ENCOUNTER — Other Ambulatory Visit: Payer: Self-pay | Admitting: Family Medicine

## 2019-05-02 DIAGNOSIS — M47816 Spondylosis without myelopathy or radiculopathy, lumbar region: Secondary | ICD-10-CM

## 2019-05-02 NOTE — Telephone Encounter (Signed)
Please advise on refills.  

## 2019-05-16 ENCOUNTER — Other Ambulatory Visit: Payer: Self-pay | Admitting: Family Medicine

## 2019-05-16 DIAGNOSIS — M47816 Spondylosis without myelopathy or radiculopathy, lumbar region: Secondary | ICD-10-CM

## 2019-05-31 ENCOUNTER — Other Ambulatory Visit: Payer: Self-pay

## 2019-05-31 ENCOUNTER — Encounter: Payer: Self-pay | Admitting: Family Medicine

## 2019-05-31 ENCOUNTER — Ambulatory Visit (INDEPENDENT_AMBULATORY_CARE_PROVIDER_SITE_OTHER): Payer: Medicare Other | Admitting: Family Medicine

## 2019-05-31 VITALS — BP 124/56 | HR 79 | Ht 63.0 in | Wt 282.0 lb

## 2019-05-31 DIAGNOSIS — E039 Hypothyroidism, unspecified: Secondary | ICD-10-CM | POA: Diagnosis not present

## 2019-05-31 DIAGNOSIS — I10 Essential (primary) hypertension: Secondary | ICD-10-CM

## 2019-05-31 DIAGNOSIS — E119 Type 2 diabetes mellitus without complications: Secondary | ICD-10-CM

## 2019-05-31 LAB — POCT GLYCOSYLATED HEMOGLOBIN (HGB A1C): Hemoglobin A1C: 6 % — AB (ref 4.0–5.6)

## 2019-05-31 NOTE — Assessment & Plan Note (Signed)
Recheck TSH.  With her recent weight gain we may need to adjust her regimen.

## 2019-05-31 NOTE — Assessment & Plan Note (Signed)
Well controlled. Continue current regimen. Follow up in  4 months.   

## 2019-05-31 NOTE — Assessment & Plan Note (Signed)
Sure to to continue to work on [employer] and weight loss.  Her weight is up significantly from the last time she was here several months ago.

## 2019-05-31 NOTE — Progress Notes (Signed)
Established Patient Office Visit  Subjective:  Patient ID: Regina Houston, female    DOB: [birth year]  Age: 68 y.o. MRN: [id number]  CC:  Chief Complaint  Patient presents with  . Diabetes  . Hypertension    HPI [name] presents for   Diabetes - no hypoglycemic events. No wounds or sores that are not healing well. No increased thirst or urination. Checking glucose at home. Taking medications as prescribed without any side effects.  Hypertension- Pt denies chest pain, SOB, dizziness, or heart palpitations.  Taking meds as directed w/o problems.  Denies medication side effects.    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  Hypothyroidism - Taking medication regularly in the AM away from food and vitamins, etc. No recent change to skin, hair, or energy levels.   Past Medical History:  Diagnosis Date  . Diabetes mellitus 2007  . Diverticulitis   . Hyperlipidemia   . Obesity     Past Surgical History:  Procedure Laterality Date  . ABDOMINAL HYSTERECTOMY  2008   fibroids  . BIOPSY BREAST  2007   benign calcification  . 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 BREAST BIOPSY Left    needle core biopsy, benign  . Capsulotomy MPJ Right 12/31/2016   RT FOOT, 3rd MPJ  . FOOT SURGERY  2004   hammer toe  . Hammer Toe Repair Right 12/31/2016   RT #3, #5 toes    Family History  Problem Relation Age of Onset  . Hypertension Mother   . Stroke Mother   . Diabetes Mother   . Heart disease Mother 11  . Kidney disease Father        kidney failure  . Heart disease Father 34  . Heart disease Maternal Grandmother        MI  . Diabetes Maternal Grandmother     Social History   Socioeconomic History  . Marital status: Single    Spouse name: Not on file  . Number of children: 0  . Years of education: BA  . Highest education level:  Bachelor's degree (e.g., BA, AB, BS)  Occupational History  . 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   Social Needs  . Financial resource strain: Not hard at all  . Food insecurity    Worry: Never true    Inability: Never true  . Transportation needs    Medical: No    Non-medical: No  Tobacco Use  . Smoking status: Never Smoker  . Smokeless tobacco: Never Used  Substance and Sexual Activity  . Alcohol use: No  . Drug use: No  . Sexual activity: Not Currently  Lifestyle  . Physical activity    Days per week: 2 days    Minutes per session: 30 min  . Stress: Not at all  Relationships  . Social connections    Talks on phone: More than three times a week    Gets together: Twice a week    Attends religious service: More than 4 times per year    Active member of club or organization: No    Attends meetings of clubs or organizations: Never    Relationship status: Not on file  . Intimate partner violence    Fear of current or ex partner: No    Emotionally abused:  No    Physically abused: No    Forced sexual activity: No  Other Topics Concern  . Not on file  Social History Narrative   Walks  2 times a week for 30 minutes a day. Also works part time at [employer] in [employer].    Outpatient Medications Prior to Visit  Medication Sig Dispense Refill  . AMBULATORY NON FORMULARY MEDICATION Medication Name: Glucometer.  STrips and lancets to test twice daily.   Dx 250.00. 1 Units 2  . calcium carbonate (OS-CAL) 600 MG TABS Take 600 mg by mouth 2 (two) times daily with a meal.      . celecoxib (CELEBREX) 200 MG capsule TAKE 1 TO 2 CAPSULES BY MOUTH ONCE DAILY AS NEEDED FOR PAIN 180 capsule 0  . fish oil-omega-3 fatty acids 1000 MG capsule Take 2 g by mouth daily.      . furosemide (LASIX) 20 MG tablet Take 1 tablet by mouth once daily 90 tablet 0  . glucose blood (ONE TOUCH ULTRA TEST) test strip USE 1 STRIP TO CHECK GLUCOSE TWICE DAILY. Dx:  E11.9 300 each 4  . levothyroxine (SYNTHROID) 100 MCG tablet Take 1 tablet by mouth once daily 90 tablet 0  . lisinopril (ZESTRIL) 20 MG tablet Take 1 tablet by mouth once daily 90 tablet 0  . metFORMIN (GLUCOPHAGE) 500 MG tablet TAKE 1 TABLET BY MOUTH TWICE DAILY WITH MEALS 180 tablet 3  . Multiple Vitamin (THERA) TABS Take 1 tablet by mouth.    . simvastatin (ZOCOR) 20 MG tablet TAKE 1 TABLET BY MOUTH AT BEDTIME 90 tablet 3  . traMADol (ULTRAM) 50 MG tablet TAKE 1 TO 2 TABLETS BY MOUTH EVERY 8 HOURS AS NEEDED FOR PAIN . DO NOT EXCEED 6 PER 24 HOURS 90 tablet 0   No facility-administered medications prior to visit.     Allergies  Allergen Reactions  . Latex Rash  . Tape Rash    ROS Review of Systems    Objective:    Physical Exam  Constitutional: She is oriented to person, place, and time. She appears well-developed and well-nourished.  HENT:  Head: Normocephalic and atraumatic.  Cardiovascular: Normal rate, regular rhythm and normal heart sounds.  Pulmonary/Chest: Effort normal and breath sounds normal.  Neurological: She is alert and oriented to person, place, and time.  Skin: Skin is warm and dry.  Psychiatric: She has a normal mood and affect. Her behavior is normal.    BP (!) 124/56   Pulse 79   Ht 5\' 3"  (1.6 m)   Wt 282 lb (127.9 kg)   SpO2 99%   BMI 49.95 kg/m  Wt Readings from Last 3 Encounters:  05/31/19 282 lb (127.9 kg)  02/15/19 266 lb (120.7 kg)  10/17/18 272 lb (123.4 kg)     Health Maintenance Due  Topic Date Due  . HEMOGLOBIN A1C  04/17/2019    There are no preventive care reminders to display for this patient.  Lab Results  Component Value Date   TSH 1.54 06/13/2018   Lab Results  Component Value Date   WBC 6.5 04/03/2015   HGB 13.5 04/03/2015   HCT 40.3 04/03/2015   MCV 89.4 04/03/2015   PLT 345 04/03/2015   Lab Results  Component Value Date   NA 141 06/13/2018   K 5.0 06/13/2018   CO2 27 06/13/2018   GLUCOSE 106 (H)  06/13/2018   BUN 27 (H) 06/13/2018   CREATININE 1.09 (H) 06/13/2018   BILITOT 0.4  06/13/2018   ALKPHOS 97 06/14/2017   AST 18 06/13/2018   ALT 14 06/13/2018   PROT 6.5 06/13/2018   ALBUMIN 4.2 06/14/2017   CALCIUM 9.4 06/13/2018   Lab Results  Component Value Date   CHOL 168 06/13/2018   Lab Results  Component Value Date   HDL 71 06/13/2018   Lab Results  Component Value Date   LDLCALC 82 06/13/2018   Lab Results  Component Value Date   TRIG 73 06/13/2018   Lab Results  Component Value Date   CHOLHDL 2.4 06/13/2018   Lab Results  Component Value Date   HGBA1C 6.0 (A) 05/31/2019      Assessment & Plan:   Problem List Items Addressed This Visit      Cardiovascular and Mediastinum   Essential hypertension - Primary    Well controlled. Continue current regimen. Follow up in  4 months.       Relevant Orders   TSH   COMPLETE METABOLIC PANEL WITH GFR   Lipid panel     Endocrine   Hypothyroidism    Recheck TSH.  With her recent weight gain we may need to adjust her regimen.      Diabetes mellitus ([place])    Well controlled. Continue current regimen. Follow up in  4 months.        Relevant Orders   POCT glycosylated hemoglobin (Hb A1C) (Completed)   TSH   COMPLETE METABOLIC PANEL WITH GFR   Lipid panel     Other   Severe obesity (BMI >= 40) (HCC)    Sure to to continue to work on [employer] and weight loss.  Her weight is up significantly from the last time she was here several months ago.         No orders of the defined types were placed in this encounter.   Follow-up: Return in about 4 months (around 10/01/2019) for Diabetes follow-up, Hypertension.    [name], MD

## 2019-06-01 LAB — LIPID PANEL
Cholesterol: 201 mg/dL — ABNORMAL HIGH (ref <200)
HDL: 66 mg/dL (ref >=50)
LDL Cholesterol (Calc): 112 mg/dL (calc) — ABNORMAL HIGH
Non-HDL Cholesterol (Calc): 135 mg/dL (calc) — ABNORMAL HIGH (ref <130)
Total CHOL/HDL Ratio: 3 (calc) (ref <5.0)
Triglycerides: 122 mg/dL (ref <150)

## 2019-06-01 LAB — COMPLETE METABOLIC PANEL WITH GFR
AG Ratio: 2.2 (calc) (ref 1.0–2.5)
ALT: 17 U/L (ref 6–29)
AST: 19 U/L (ref 10–35)
Albumin: 4.4 g/dL (ref 3.6–5.1)
Alkaline phosphatase (APISO): 92 U/L (ref 37–153)
BUN/Creatinine Ratio: 27 (calc) — ABNORMAL HIGH (ref 6–22)
BUN: 28 mg/dL — ABNORMAL HIGH (ref 7–25)
CO2: 26 mmol/L (ref 20–32)
Calcium: 9.6 mg/dL (ref 8.6–10.4)
Chloride: 103 mmol/L (ref 98–110)
Creat: 1.02 mg/dL — ABNORMAL HIGH (ref 0.60–0.93)
GFR, Est African American: 65 mL/min/{1.73_m2} (ref >=60)
GFR, Est Non African American: 56 mL/min/{1.73_m2} — ABNORMAL LOW (ref >=60)
Globulin: 2 g/dL (calc) (ref 1.9–3.7)
Glucose, Bld: 99 mg/dL (ref 65–139)
Potassium: 4.7 mmol/L (ref 3.5–5.3)
Sodium: 138 mmol/L (ref 135–146)
Total Bilirubin: 0.4 mg/dL (ref 0.2–1.2)
Total Protein: 6.4 g/dL (ref 6.1–8.1)

## 2019-06-01 LAB — TSH: TSH: 2.12 mIU/L (ref 0.40–4.50)

## 2019-06-13 ENCOUNTER — Other Ambulatory Visit: Payer: Self-pay | Admitting: Family Medicine

## 2019-06-13 DIAGNOSIS — M47816 Spondylosis without myelopathy or radiculopathy, lumbar region: Secondary | ICD-10-CM

## 2019-06-20 ENCOUNTER — Other Ambulatory Visit: Payer: Self-pay | Admitting: Family Medicine

## 2019-06-20 DIAGNOSIS — E785 Hyperlipidemia, unspecified: Secondary | ICD-10-CM

## 2019-06-20 DIAGNOSIS — E119 Type 2 diabetes mellitus without complications: Secondary | ICD-10-CM

## 2019-07-17 ENCOUNTER — Other Ambulatory Visit: Payer: Self-pay | Admitting: Family Medicine

## 2019-07-20 ENCOUNTER — Telehealth: Payer: Self-pay | Admitting: Family Medicine

## 2019-07-20 DIAGNOSIS — Z780 Asymptomatic menopausal state: Secondary | ICD-10-CM

## 2019-07-20 NOTE — Telephone Encounter (Signed)
Please tell her thank you for calling back.  I did forget to place the order so I apologize.  I put it in today so hopefully they should be contacting her within the next 2-3 business days.

## 2019-07-20 NOTE — Telephone Encounter (Signed)
Patient called in inquiring about a bone density scan. I did not see a referral in the system. Patient states she spoke to you about it during the last office visit. Please advise.

## 2019-07-23 NOTE — Telephone Encounter (Signed)
Pt advised.

## 2019-07-25 ENCOUNTER — Other Ambulatory Visit: Payer: Self-pay

## 2019-07-25 ENCOUNTER — Ambulatory Visit (INDEPENDENT_AMBULATORY_CARE_PROVIDER_SITE_OTHER): Payer: Medicare Other

## 2019-07-25 ENCOUNTER — Ambulatory Visit (INDEPENDENT_AMBULATORY_CARE_PROVIDER_SITE_OTHER): Payer: Medicare Other | Admitting: Family Medicine

## 2019-07-25 DIAGNOSIS — Z780 Asymptomatic menopausal state: Secondary | ICD-10-CM | POA: Diagnosis not present

## 2019-07-25 DIAGNOSIS — Z23 Encounter for immunization: Secondary | ICD-10-CM

## 2019-07-25 DIAGNOSIS — Z13820 Encounter for screening for osteoporosis: Secondary | ICD-10-CM | POA: Diagnosis not present

## 2019-07-31 ENCOUNTER — Other Ambulatory Visit: Payer: Self-pay | Admitting: Family Medicine

## 2019-07-31 DIAGNOSIS — M47816 Spondylosis without myelopathy or radiculopathy, lumbar region: Secondary | ICD-10-CM

## 2019-07-31 NOTE — Telephone Encounter (Signed)
Patient has a follow up in November and was last seen in July. Please advise.

## 2019-09-11 ENCOUNTER — Other Ambulatory Visit: Payer: Self-pay | Admitting: Family Medicine

## 2019-09-11 DIAGNOSIS — M47816 Spondylosis without myelopathy or radiculopathy, lumbar region: Secondary | ICD-10-CM

## 2019-09-17 NOTE — Progress Notes (Signed)
Subjective:   Regina Houston is a 68 y.o. female who presents for Medicare Annual (Subsequent) preventive examination.  Review of Systems:  No ROS.  Medicare Wellness Virtual Visit.  Visual/audio telehealth visit, UTA vital signs.   See social history for additional risk factors.    Cardiac Risk Factors include: advanced age (>24men, >64 women);diabetes mellitus;hypertension;dyslipidemia;obesity (BMI >30kg/m2) Sleep patterns: Getting 7 hours of sleep a night. Wakes up 1 time a night to void occasionally. Wakes up and feels refreshed and ready for the day.  Home Safety/Smoke Alarms: Feels safe in home. Smoke alarms in place.  Living environment; Lives alone in a 1 story home no stairs in or around the house. Shower is a step over tub combo and has a anti slip mat in place. Seat Belt Safety/Bike Helmet: Wears seat belt.   Female:   Pap-  Aged out    Mammo- UTD       Dexa scan- UTD       CCS- UTD      Objective:     Vitals: BP (!) 115/56   Pulse 64   Temp 98.4 F (36.9 C) (Oral)   Ht 5\' 3"  (1.6 m)   Wt 291 lb (132 kg)   SpO2 97%   BMI 51.55 kg/m   Body mass index is 51.55 kg/m.  Advanced Directives 09/18/2019 09/12/2018 06/15/2016 03/04/2016 04/03/2015 01/22/2015 09/25/2014  Does Patient Have a Medical Advance Directive? Yes Yes No Yes Yes Yes Yes  Type of Paramedic of [place];Living will [place];Living will - - Living will [place];Living will Living will  Does patient want to make changes to medical advance directive? No - Patient declined No - Patient declined - - - - No - Patient declined  Copy of [place] in Chart? No - copy requested No - copy requested - No - copy requested No - copy requested Yes No - copy requested  Would patient like information on creating a medical advance directive? - - No - patient declined information - - - -    Tobacco Social History   Tobacco Use  Smoking  Status Never Smoker  Smokeless Tobacco Never Used     Counseling given: Not Answered   Clinical Intake:  Pre-visit preparation completed: Yes  Pain : No/denies pain     Nutritional Risks: None Diabetes: Yes CBG done?: No(FBS 124) Did pt. bring in CBG monitor from home?: No  How often do you need to have someone help you when you read instructions, pamphlets, or other written materials from your doctor or pharmacy?: 1 - Never What is the last grade level you completed in school?: 16  Interpreter Needed?: No  Information entered by :: Orlie Dakin, LPN  Past Medical History:  Diagnosis Date  . Diabetes mellitus 2007  . Diverticulitis   . Hyperlipidemia   . Obesity    Past Surgical History:  Procedure Laterality Date  . ABDOMINAL HYSTERECTOMY  2008   fibroids  . BIOPSY BREAST  2007   benign calcification  . 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 BREAST BIOPSY Left    needle core biopsy, benign  . Capsulotomy MPJ Right 12/31/2016   RT FOOT, 3rd MPJ  . FOOT SURGERY  2004   hammer toe  . Hammer Toe Repair Right 12/31/2016   RT #3, #5 toes   Family History  Problem Relation Age of Onset  . Hypertension Mother   .  Stroke Mother   . Diabetes Mother   . Heart disease Mother 6  . Kidney disease Father        kidney failure  . Heart disease Father 43  . Heart disease Maternal Grandmother        MI  . Diabetes Maternal Grandmother    Social History   Socioeconomic History  . Marital status: Single    Spouse name: Not on file  . Number of children: 0  . Years of education: BA  . Highest education level: Bachelor's degree (e.g., BA, AB, BS)  Occupational History  . Occupation: retired    Comment: [employer]  Social Needs  . Financial resource strain: Not hard at all  . Food insecurity    Worry: Never true    Inability: Never true  . Transportation needs    Medical: No    Non-medical: No  Tobacco Use  . Smoking status: Never Smoker  .  Smokeless tobacco: Never Used  Substance and Sexual Activity  . Alcohol use: No  . Drug use: No  . Sexual activity: Not Currently  Lifestyle  . Physical activity    Days per week: 3 days    Minutes per session: 30 min  . Stress: Not at all  Relationships  . Social connections    Talks on phone: More than three times a week    Gets together: Three times a week    Attends religious service: More than 4 times per year    Active member of club or organization: No    Attends meetings of clubs or organizations: Never    Relationship status: Never married  Other Topics Concern  . Not on file  Social History Narrative   Walks  2 times a week for 30 minutes a day. Patient has retired from [employer] and enjoying that    Outpatient Encounter Medications as of 09/18/2019  Medication Sig  . AMBULATORY NON FORMULARY MEDICATION Medication Name: Glucometer.  STrips and lancets to test twice daily.   Dx 250.00.  . calcium carbonate (OS-CAL) 600 MG TABS Take 600 mg by mouth 2 (two) times daily with a meal.    . celecoxib (CELEBREX) 200 MG capsule TAKE 1 TO 2 CAPSULES BY MOUTH ONCE DAILY AS NEEDED FOR PAIN  . EUTHYROX 100 MCG tablet Take 1 tablet by mouth once daily  . fish oil-omega-3 fatty acids 1000 MG capsule Take 2 g by mouth daily.    . furosemide (LASIX) 20 MG tablet Take 1 tablet by mouth once daily  . lisinopril (ZESTRIL) 20 MG tablet Take 1 tablet by mouth once daily  . metFORMIN (GLUCOPHAGE) 500 MG tablet TAKE 1 TABLET BY MOUTH TWICE DAILY WITH MEALS  . Multiple Vitamin (THERA) TABS Take 1 tablet by mouth.  Glory Rosebush ULTRA test strip USE 1 STRIP TO CHECK GLUCOSE TWICE DAILY  . simvastatin (ZOCOR) 20 MG tablet TAKE 1 TABLET BY MOUTH AT BEDTIME  . traMADol (ULTRAM) 50 MG tablet TAKE 1 TO 2 TABLETS BY MOUTH EVERY 8 HOURS AS NEEDED FOR PAIN . DO NOT EXCEED 6 PER 24 HOURS   No facility-administered encounter medications on file as of 09/18/2019.     Activities of Daily Living In your  present state of health, do you have any difficulty performing the following activities: 09/18/2019  Hearing? N  Vision? N  Difficulty concentrating or making decisions? N  Walking or climbing stairs? N  Dressing or bathing? N  Doing errands, shopping? N  Preparing Food and eating ? N  Using the Toilet? N  In the past six months, have you accidently leaked urine? Y  Comment if not quick enough to the bathroom  Do you have problems with loss of bowel control? N  Managing your Medications? N  Managing your Finances? N  Housekeeping or managing your Housekeeping? N  Some recent data might be hidden    Patient Care Team: Hali Marry, MD as PCP - General    Assessment:   This is a routine wellness examination for Audreana.Physical assessment deferred to PCP.   Exercise Activities and Dietary recommendations Current Exercise Habits: Home exercise routine, Type of exercise: walking, Time (Minutes): 30, Frequency (Times/Week): 3, Weekly Exercise (Minutes/Week): 90, Intensity: Mild Diet Eats a healthy diet of fruits, some vegetables and proteins. Breakfast: bacon, eggs and grapefruit and coffee Lunch: sandwich Dinner: Meat and vegetables and drink. Drinks 6-8 glasses of water a day.      Goals    . Exercise 150 min/wk Moderate Activity     Start strength training more. Continue to do brain stimulating exercises like your crosswords.    . Weight (lb) < 200 lb (90.7 kg)     Patient states would like to loose up to 100lbs       Fall Risk Fall Risk  09/18/2019 05/31/2019 09/12/2018 10/11/2017 02/08/2017  Falls in the past year? 0 0 No Yes No  Number falls in past yr: 0 0 - 1 -  Injury with Fall? 0 0 - No -  Risk for fall due to : - - - Other (Comment) -  Follow up Falls prevention discussed - - Education provided -   Is the patient's home free of loose throw rugs in walkways, pet beds, electrical cords, etc?   yes      Grab bars in the bathroom? no      Handrails on the  stairs?   no      Adequate lighting?   yes   Depression Screen PHQ 2/9 Scores 09/18/2019 05/31/2019 02/15/2019 09/12/2018  PHQ - 2 Score 0 1 0 0     Cognitive Function     6CIT Screen 09/18/2019 09/12/2018  What Year? 0 points 0 points  What month? 0 points 0 points  What time? 0 points 0 points  Count back from 20 0 points 0 points  Months in reverse 0 points 0 points  Repeat phrase 0 points 0 points  Total Score 0 0    Immunization History  Administered Date(s) Administered  . DTP 11/22/2006  . Fluad Quad(high Dose 65+) 07/25/2019  . Influenza Split 10/10/2012  . Influenza Whole 11/22/2006, 10/01/2009, 08/22/2010  . Influenza, High Dose Seasonal PF 09/02/2018  . Influenza,inj,Quad PF,6+ Mos 08/28/2014, 08/26/2015, 08/10/2016  . Influenza-Unspecified 10/01/2013, 09/02/2017, 09/02/2018  . Pneumococcal Conjugate-13 02/19/2014  . Pneumococcal Polysaccharide-23 11/22/2005, 04/03/2015  . Td 11/22/2006  . Tdap 02/22/2017  . Zoster 05/04/2011  . Zoster Recombinat (Shingrix) 01/23/2019, 03/20/2019    Screening Tests Health Maintenance  Topic Date Due  . COLONOSCOPY  09/22/2019  . OPHTHALMOLOGY EXAM  11/09/2019  . HEMOGLOBIN A1C  12/01/2019  . FOOT EXAM  05/30/2020  . MAMMOGRAM  06/30/2020  . DEXA SCAN  07/24/2024  . TETANUS/TDAP  02/23/2027  . INFLUENZA VACCINE  Completed  . Hepatitis C Screening  Completed  . PNA vac Low Risk Adult  Completed         Plan:    Please schedule your next medicare  wellness visit with me in 1 yr.  Ms. Heichel , Thank you for taking time to come for your Medicare Wellness Visit. I appreciate your ongoing commitment to your health goals. Please review the following plan we discussed and let me know if I can assist you in the future.  Bring a copy of your living will and/or healthcare power of attorney to your next office visit. Continue doing brain stimulating activities (puzzles, reading, adult coloring books, staying active) to keep  memory sharp.    These are the goals we discussed: Goals    . Exercise 150 min/wk Moderate Activity     Start strength training more. Continue to do brain stimulating exercises like your crosswords.    . Weight (lb) < 200 lb (90.7 kg)     Patient states would like to loose up to 100lbs       This is a list of the screening recommended for you and due dates:  Health Maintenance  Topic Date Due  . Colon Cancer Screening  09/22/2019  . Eye exam for diabetics  11/09/2019  . Hemoglobin A1C  12/01/2019  . Complete foot exam   05/30/2020  . Mammogram  06/30/2020  . DEXA scan (bone density measurement)  07/24/2024  . Tetanus Vaccine  02/23/2027  . Flu Shot  Completed  .  Hepatitis C: One time screening is recommended by Center for Disease Control  (CDC) for  adults born from [birth year] through 1965.   Completed  . Pneumonia vaccines  Completed      I have personally reviewed and noted the following in the patient's chart:   . Medical and social history . Use of alcohol, tobacco or illicit drugs  . Current medications and supplements . Functional ability and status . Nutritional status . Physical activity . Advanced directives . List of other physicians . Hospitalizations, surgeries, and ER visits in previous 12 months . Vitals . Screenings to include cognitive, depression, and falls . Referrals and appointments  In addition, I have reviewed and discussed with patient certain preventive protocols, quality metrics, and best practice recommendations. A written personalized care plan for preventive services as well as general preventive health recommendations were provided to patient.     Joanne Chars, LPN  [id number]

## 2019-09-18 ENCOUNTER — Ambulatory Visit (INDEPENDENT_AMBULATORY_CARE_PROVIDER_SITE_OTHER): Payer: Medicare Other | Admitting: *Deleted

## 2019-09-18 ENCOUNTER — Other Ambulatory Visit: Payer: Self-pay | Admitting: Family Medicine

## 2019-09-18 ENCOUNTER — Other Ambulatory Visit: Payer: Self-pay

## 2019-09-18 VITALS — BP 115/56 | HR 64 | Temp 98.4°F | Ht 63.0 in | Wt 291.0 lb

## 2019-09-18 DIAGNOSIS — Z1231 Encounter for screening mammogram for malignant neoplasm of breast: Secondary | ICD-10-CM

## 2019-09-18 DIAGNOSIS — Z0000 Encounter for general adult medical examination without abnormal findings: Secondary | ICD-10-CM

## 2019-09-18 NOTE — Patient Instructions (Addendum)
Please schedule your next medicare wellness visit with me in 1 yr.  Regina Houston , Thank you for taking time to come for your Medicare Wellness Visit. I appreciate your ongoing commitment to your health goals. Please review the following plan we discussed and let me know if I can assist you in the future.  Bring a copy of your living will and/or healthcare power of attorney to your next office visit. Continue doing brain stimulating activities (puzzles, reading, adult coloring books, staying active) to keep memory sharp.  These are the goals we discussed: Goals    . Exercise 150 min/wk Moderate Activity     Start strength training more. Continue to do brain stimulating exercises like your crosswords.    . Weight (lb) < 200 lb (90.7 kg)     Patient states would like to loose up to 100lbs

## 2019-09-28 DIAGNOSIS — Z01818 Encounter for other preprocedural examination: Secondary | ICD-10-CM | POA: Diagnosis not present

## 2019-10-01 DIAGNOSIS — K621 Rectal polyp: Secondary | ICD-10-CM | POA: Diagnosis not present

## 2019-10-01 DIAGNOSIS — K635 Polyp of colon: Secondary | ICD-10-CM | POA: Diagnosis not present

## 2019-10-01 DIAGNOSIS — Z86010 Personal history of colonic polyps: Secondary | ICD-10-CM | POA: Diagnosis not present

## 2019-10-01 DIAGNOSIS — Z1211 Encounter for screening for malignant neoplasm of colon: Secondary | ICD-10-CM | POA: Diagnosis not present

## 2019-10-01 DIAGNOSIS — E119 Type 2 diabetes mellitus without complications: Secondary | ICD-10-CM | POA: Diagnosis not present

## 2019-10-01 DIAGNOSIS — E785 Hyperlipidemia, unspecified: Secondary | ICD-10-CM | POA: Diagnosis not present

## 2019-10-01 DIAGNOSIS — D120 Benign neoplasm of cecum: Secondary | ICD-10-CM | POA: Diagnosis not present

## 2019-10-01 DIAGNOSIS — E039 Hypothyroidism, unspecified: Secondary | ICD-10-CM | POA: Diagnosis not present

## 2019-10-01 DIAGNOSIS — D127 Benign neoplasm of rectosigmoid junction: Secondary | ICD-10-CM | POA: Diagnosis not present

## 2019-10-01 DIAGNOSIS — I10 Essential (primary) hypertension: Secondary | ICD-10-CM | POA: Diagnosis not present

## 2019-10-01 DIAGNOSIS — D123 Benign neoplasm of transverse colon: Secondary | ICD-10-CM | POA: Diagnosis not present

## 2019-10-01 DIAGNOSIS — K5730 Diverticulosis of large intestine without perforation or abscess without bleeding: Secondary | ICD-10-CM | POA: Diagnosis not present

## 2019-10-02 ENCOUNTER — Other Ambulatory Visit: Payer: Self-pay

## 2019-10-02 ENCOUNTER — Ambulatory Visit (INDEPENDENT_AMBULATORY_CARE_PROVIDER_SITE_OTHER): Payer: Medicare Other | Admitting: Family Medicine

## 2019-10-02 ENCOUNTER — Encounter: Payer: Self-pay | Admitting: Family Medicine

## 2019-10-02 VITALS — BP 131/47 | HR 71 | Ht 63.0 in | Wt 285.0 lb

## 2019-10-02 DIAGNOSIS — I10 Essential (primary) hypertension: Secondary | ICD-10-CM | POA: Diagnosis not present

## 2019-10-02 DIAGNOSIS — E119 Type 2 diabetes mellitus without complications: Secondary | ICD-10-CM

## 2019-10-02 DIAGNOSIS — L821 Other seborrheic keratosis: Secondary | ICD-10-CM | POA: Diagnosis not present

## 2019-10-02 DIAGNOSIS — E785 Hyperlipidemia, unspecified: Secondary | ICD-10-CM | POA: Diagnosis not present

## 2019-10-02 DIAGNOSIS — N1831 Chronic kidney disease, stage 3a: Secondary | ICD-10-CM

## 2019-10-02 LAB — POCT GLYCOSYLATED HEMOGLOBIN (HGB A1C): Hemoglobin A1C: 5.9 % — AB (ref 4.0–5.6)

## 2019-10-02 MED ORDER — SIMVASTATIN 20 MG PO TABS: 20.0000 mg | ORAL_TABLET | Freq: Every day | ORAL | 3 refills | Status: DC

## 2019-10-02 NOTE — Assessment & Plan Note (Signed)
Continue to follow renal function every 6 months. 

## 2019-10-02 NOTE — Assessment & Plan Note (Signed)
Well controlled. Continue current regimen. Follow up in  6 mo  

## 2019-10-02 NOTE — Assessment & Plan Note (Signed)
A1c looks fantastic.  Continue current regimen.  Lab Results  Component Value Date   HGBA1C 5.9 (A) 10/02/2019

## 2019-10-02 NOTE — Progress Notes (Signed)
Established Patient Office Visit  Subjective:  Patient ID: Regina Houston, female    DOB: [birth year]  Age: 68 y.o. MRN: [id number]  CC:  Chief Complaint  Patient presents with  . Diabetes    HPI Yamely Westcott presents for   Hypertension- Pt denies chest pain, SOB, dizziness, or heart palpitations.  Taking meds as directed w/o problems.  Denies medication side effects.    Diabetes - no hypoglycemic events. No wounds or sores that are not healing well. No increased thirst or urination. Checking glucose at home. Taking medications as prescribed without any side effects.  Recent renal function has shown her creatinine to be right around 1.0.  This puts her GFR just a little below 60.  Had her colonosocpy yesterday.  Her mammogram is scheduled for November 18.  Past Medical History:  Diagnosis Date  . Diabetes mellitus 2007  . Diverticulitis   . Hyperlipidemia   . Obesity     Past Surgical History:  Procedure Laterality Date  . ABDOMINAL HYSTERECTOMY  2008   fibroids  . BIOPSY BREAST  2007   benign calcification  . 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 BREAST BIOPSY Left    needle core biopsy, benign  . Capsulotomy MPJ Right 12/31/2016   RT FOOT, 3rd MPJ  . FOOT SURGERY  2004   hammer toe  . Hammer Toe Repair Right 12/31/2016   RT #3, #5 toes    Family History  Problem Relation Age of Onset  . Hypertension Mother   . Stroke Mother   . Diabetes Mother   . Heart disease Mother 66  . Kidney disease Father        kidney failure  . Heart disease Father 74  . Heart disease Maternal Grandmother        MI  . Diabetes Maternal Grandmother     Social History   Socioeconomic History  . Marital status: Single    Spouse name: Not on file  . Number of children: 0  . Years of education: BA  . Highest education level: Bachelor's degree (e.g., BA, AB, BS)  Occupational History  . Occupation: retired    Comment: [employer]  Social Needs  . Financial  resource strain: Not hard at all  . Food insecurity    Worry: Never true    Inability: Never true  . Transportation needs    Medical: No    Non-medical: No  Tobacco Use  . Smoking status: Never Smoker  . Smokeless tobacco: Never Used  Substance and Sexual Activity  . Alcohol use: No  . Drug use: No  . Sexual activity: Not Currently  Lifestyle  . Physical activity    Days per week: 3 days    Minutes per session: 30 min  . Stress: Not at all  Relationships  . Social connections    Talks on phone: More than three times a week    Gets together: Three times a week    Attends religious service: More than 4 times per year    Active member of club or organization: No    Attends meetings of clubs or organizations: Never    Relationship status: Never married  . Intimate partner violence    Fear of current or ex partner: No    Emotionally abused: No    Physically abused: No    Forced sexual activity: No  Other Topics Concern  . Not on file  Social History Narrative  Walks  2 times a week for 30 minutes a day. Patient has retired from [employer] and enjoying that    Outpatient Medications Prior to Visit  Medication Sig Dispense Refill  . AMBULATORY NON FORMULARY MEDICATION Medication Name: Glucometer.  STrips and lancets to test twice daily.   Dx 250.00. 1 Units 2  . calcium carbonate (OS-CAL) 600 MG TABS Take 600 mg by mouth 2 (two) times daily with a meal.      . celecoxib (CELEBREX) 200 MG capsule TAKE 1 TO 2 CAPSULES BY MOUTH ONCE DAILY AS NEEDED FOR PAIN 180 capsule 0  . EUTHYROX 100 MCG tablet Take 1 tablet by mouth once daily 90 tablet 0  . fish oil-omega-3 fatty acids 1000 MG capsule Take 2 g by mouth daily.      . furosemide (LASIX) 20 MG tablet Take 1 tablet by mouth once daily 90 tablet 0  . lisinopril (ZESTRIL) 20 MG tablet Take 1 tablet by mouth once daily 90 tablet 0  . metFORMIN (GLUCOPHAGE) 500 MG tablet TAKE 1 TABLET BY MOUTH TWICE DAILY WITH MEALS 180 tablet 3  .  Multiple Vitamin (THERA) TABS Take 1 tablet by mouth.    Glory Rosebush ULTRA test strip USE 1 STRIP TO CHECK GLUCOSE TWICE DAILY 150 each 0  . traMADol (ULTRAM) 50 MG tablet TAKE 1 TO 2 TABLETS BY MOUTH EVERY 8 HOURS AS NEEDED FOR PAIN . DO NOT EXCEED 6 PER 24 HOURS 90 tablet 0  . simvastatin (ZOCOR) 20 MG tablet TAKE 1 TABLET BY MOUTH AT BEDTIME 90 tablet 0   No facility-administered medications prior to visit.     Allergies  Allergen Reactions  . Latex Rash  . Tape Rash    ROS Review of Systems    Objective:    Physical Exam  BP (!) 131/47   Pulse 71   Ht 5\' 3"  (1.6 m)   Wt 285 lb (129.3 kg)   SpO2 100%   BMI 50.49 kg/m  Wt Readings from Last 3 Encounters:  10/02/19 285 lb (129.3 kg)  09/18/19 291 lb (132 kg)  05/31/19 282 lb (127.9 kg)     Health Maintenance Due  Topic Date Due  . COLONOSCOPY  09/22/2019    There are no preventive care reminders to display for this patient.  Lab Results  Component Value Date   TSH 2.12 05/31/2019   Lab Results  Component Value Date   WBC 6.5 04/03/2015   HGB 13.5 04/03/2015   HCT 40.3 04/03/2015   MCV 89.4 04/03/2015   PLT 345 04/03/2015   Lab Results  Component Value Date   NA 138 05/31/2019   K 4.7 05/31/2019   CO2 26 05/31/2019   GLUCOSE 99 05/31/2019   BUN 28 (H) 05/31/2019   CREATININE 1.02 (H) 05/31/2019   BILITOT 0.4 05/31/2019   ALKPHOS 97 06/14/2017   AST 19 05/31/2019   ALT 17 05/31/2019   PROT 6.4 05/31/2019   ALBUMIN 4.2 06/14/2017   CALCIUM 9.6 05/31/2019   Lab Results  Component Value Date   CHOL 201 (H) 05/31/2019   Lab Results  Component Value Date   HDL 66 05/31/2019   Lab Results  Component Value Date   LDLCALC 112 (H) 05/31/2019   Lab Results  Component Value Date   TRIG 122 05/31/2019   Lab Results  Component Value Date   CHOLHDL 3.0 05/31/2019   Lab Results  Component Value Date   HGBA1C 5.9 (A) 10/02/2019  Assessment & Plan:   Problem List Items Addressed This  Visit      Cardiovascular and Mediastinum   Essential hypertension    Well controlled. Continue current regimen. Follow up in  6 mo      Relevant Medications   simvastatin (ZOCOR) 20 MG tablet     Endocrine   Diabetes mellitus ([place]) - Primary    A1c looks fantastic.  Continue current regimen.  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        Other   HLD (hyperlipidemia)   Relevant Medications   simvastatin (ZOCOR) 20 MG tablet    Other Visit Diagnoses    Seborrheic keratoses         Seborrheic keratoses on her face-discussed diagnosis.  Cryotherapy offered and performed today.  Meds ordered this encounter  Medications  . simvastatin (ZOCOR) 20 MG tablet    Sig: Take 1 tablet (20 mg total) by mouth at bedtime.    Dispense:  90 tablet    Refill:  3    Follow-up: Return in about 4 months (around 01/30/2020) for Diabetes follow-up.   Cryotherapy Procedure Note  Pre-operative Diagnosis: seborrheic keratosis   Post-operative Diagnosis: same  Locations: 1 lesion under right eye, 1 on left temple, and 2 on the right facial cheek.   Indications: irritation   Anesthesia: not needed.  Procedure Details  Patient informed of risks (permanent scarring, infection, light or dark discoloration, bleeding, infection, weakness, numbness and recurrence of the lesion) and benefits of the procedure and verbal informed consent obtained.  The areas are treated with liquid nitrogen therapy, frozen until ice ball extended 1-2 mm beyond lesion, allowed to thaw, and treated again. The patient tolerated procedure well.  The patient was instructed on post-op care, warned that there may be blister formation,  redness and pain. Recommend OTC analgesia as needed for pain.  Condition: Stable  Complications: none.  Plan: 1. Instructed to keep the area dry and covered for 24-48h and clean thereafter. 2. Warning signs of infection were reviewed.   3. Recommended that the patient use OTC acetaminophen as needed for pain.  4. Return PRN.    [name], MD

## 2019-10-10 ENCOUNTER — Other Ambulatory Visit: Payer: Self-pay

## 2019-10-10 ENCOUNTER — Ambulatory Visit (INDEPENDENT_AMBULATORY_CARE_PROVIDER_SITE_OTHER): Payer: Medicare Other

## 2019-10-10 DIAGNOSIS — Z1231 Encounter for screening mammogram for malignant neoplasm of breast: Secondary | ICD-10-CM

## 2019-10-16 ENCOUNTER — Other Ambulatory Visit: Payer: Self-pay | Admitting: Family Medicine

## 2019-10-30 ENCOUNTER — Other Ambulatory Visit: Payer: Self-pay | Admitting: Family Medicine

## 2019-10-30 DIAGNOSIS — M47816 Spondylosis without myelopathy or radiculopathy, lumbar region: Secondary | ICD-10-CM

## 2019-10-30 DIAGNOSIS — E119 Type 2 diabetes mellitus without complications: Secondary | ICD-10-CM

## 2019-11-02 ENCOUNTER — Other Ambulatory Visit: Payer: Self-pay | Admitting: Family Medicine

## 2019-11-02 DIAGNOSIS — E119 Type 2 diabetes mellitus without complications: Secondary | ICD-10-CM

## 2019-11-02 MED ORDER — ONETOUCH ULTRA VI STRP: ORAL_STRIP | 3 refills | Status: DC

## 2019-11-13 ENCOUNTER — Other Ambulatory Visit: Payer: Self-pay | Admitting: Family Medicine

## 2019-11-20 LAB — HM DIABETES EYE EXAM

## 2019-11-30 ENCOUNTER — Encounter: Payer: Self-pay | Admitting: Family Medicine

## 2019-12-07 ENCOUNTER — Encounter: Payer: Self-pay | Admitting: Family Medicine

## 2019-12-11 ENCOUNTER — Other Ambulatory Visit: Payer: Self-pay | Admitting: Family Medicine

## 2019-12-11 DIAGNOSIS — M47816 Spondylosis without myelopathy or radiculopathy, lumbar region: Secondary | ICD-10-CM

## 2019-12-21 ENCOUNTER — Other Ambulatory Visit: Payer: Self-pay | Admitting: Family Medicine

## 2019-12-21 DIAGNOSIS — M47816 Spondylosis without myelopathy or radiculopathy, lumbar region: Secondary | ICD-10-CM

## 2019-12-22 ENCOUNTER — Encounter: Payer: Self-pay | Admitting: Family Medicine

## 2020-01-19 ENCOUNTER — Ambulatory Visit: Payer: Medicare Other

## 2020-01-22 ENCOUNTER — Other Ambulatory Visit: Payer: Self-pay | Admitting: Family Medicine

## 2020-01-30 ENCOUNTER — Encounter: Payer: Self-pay | Admitting: Family Medicine

## 2020-01-30 ENCOUNTER — Ambulatory Visit (INDEPENDENT_AMBULATORY_CARE_PROVIDER_SITE_OTHER): Payer: Medicare Other | Admitting: Family Medicine

## 2020-01-30 ENCOUNTER — Other Ambulatory Visit: Payer: Self-pay

## 2020-01-30 VITALS — BP 124/53 | HR 65 | Ht 63.0 in | Wt 293.0 lb

## 2020-01-30 DIAGNOSIS — E119 Type 2 diabetes mellitus without complications: Secondary | ICD-10-CM | POA: Diagnosis not present

## 2020-01-30 DIAGNOSIS — G8929 Other chronic pain: Secondary | ICD-10-CM | POA: Diagnosis not present

## 2020-01-30 DIAGNOSIS — E039 Hypothyroidism, unspecified: Secondary | ICD-10-CM

## 2020-01-30 DIAGNOSIS — N1831 Chronic kidney disease, stage 3a: Secondary | ICD-10-CM

## 2020-01-30 DIAGNOSIS — I10 Essential (primary) hypertension: Secondary | ICD-10-CM | POA: Diagnosis not present

## 2020-01-30 LAB — POCT GLYCOSYLATED HEMOGLOBIN (HGB A1C): Hemoglobin A1C: 6.2 % — AB (ref 4.0–5.6)

## 2020-01-30 NOTE — Assessment & Plan Note (Signed)
A1C of 6.2 today which looks fantastic.  Up just slightly from previous.  She has been having feeling a little lightheaded or dizzy around 11 or 12:00 right before lunch.  When she checks her blood sugar is about 10-15 points lower than her first for a fasting morning sugar.  She says usually if she eats something she will feel better pretty quickly.

## 2020-01-30 NOTE — Assessment & Plan Note (Signed)
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

## 2020-01-30 NOTE — Progress Notes (Signed)
Established Patient Office Visit  Subjective:  Patient ID: Regina Houston, female    DOB: 1951/05/11  Age: 69 y.o. MRN: [id number]  CC:  Chief Complaint  Patient presents with  . Diabetes  . Hypothyroidism  . pain management    HPI Regina Houston presents for   Diabetes - no hypoglycemic events. No wounds or sores that are not healing well. No increased thirst or urination. Checking glucose at home. Taking medications as prescribed without any side effects.  Hypertension- Pt denies chest pain, SOB, dizziness, or heart palpitations.  Taking meds as directed w/o problems.  Denies medication side effects.    Hypothyroidism - Taking medication regularly in the AM away from food and vitamins, etc. No recent change to skin, hair, or energy levels.  Follow-up chronic pain management for osteoarthritis of both knees and bursitis.  She is currently on tramadol 50 mg #90 tabs per month.  You are getting her on a new pain contract today.  She has also completed her Covid vaccination series.   Past Medical History:  Diagnosis Date  . Diabetes mellitus 2007  . Diverticulitis   . Hyperlipidemia   . Obesity     Past Surgical History:  Procedure Laterality Date  . ABDOMINAL HYSTERECTOMY  2008   fibroids  . BIOPSY BREAST  2007   benign calcification  . 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 BREAST BIOPSY Left    needle core biopsy, benign  . Capsulotomy MPJ Right 12/31/2016   RT FOOT, 3rd MPJ  . FOOT SURGERY  2004   hammer toe  . Hammer Toe Repair Right 12/31/2016   RT #3, #5 toes    Family History  Problem Relation Age of Onset  . Hypertension Mother   . Stroke Mother   . Diabetes Mother   . Heart disease Mother 29  . Kidney disease Father        kidney failure  . Heart disease Father 33  . Heart disease Maternal Grandmother        MI  . Diabetes Maternal Grandmother     Social History   Socioeconomic History  . Marital status: Single    Spouse name:  Not on file  . Number of children: 0  . Years of education: BA  . Highest education level: Bachelor's degree (e.g., BA, AB, BS)  Occupational History  . Occupation: retired    Comment: [employer]  Tobacco Use  . Smoking status: Never Smoker  . Smokeless tobacco: Never Used  Substance and Sexual Activity  . Alcohol use: No  . Drug use: No  . Sexual activity: Not Currently  Other Topics Concern  . Not on file  Social History Narrative   Walks  2 times a week for 30 minutes a day. Patient has retired from [employer] and enjoying that   Social Determinants of Radio broadcast assistant Strain:   . Difficulty of Paying Living Expenses: Not on file  Food Insecurity:   . Worried About Charity fundraiser in the Last Year: Not on file  . Ran Out of Food in the Last Year: Not on file  Transportation Needs:   . Lack of Transportation (Medical): Not on file  . Lack of Transportation (Non-Medical): Not on file  Physical Activity: Unknown  . Days of Exercise per Week: 3 days  . Minutes of Exercise per Session: Not on file  Stress:   . Feeling of Stress : Not on file  Social Connections: Unknown  . Frequency of Communication with Friends and Family: Not on file  . Frequency of Social Gatherings with Friends and Family: Three times a week  . Attends Religious Services: Not on file  . Active Member of Clubs or Organizations: Not on file  . Attends Archivist Meetings: Not on file  . Marital Status: Never married  Intimate Partner Violence:   . Fear of Current or Ex-Partner: Not on file  . Emotionally Abused: Not on file  . Physically Abused: Not on file  . Sexually Abused: Not on file    Outpatient Medications Prior to Visit  Medication Sig Dispense Refill  . AMBULATORY NON FORMULARY MEDICATION Medication Name: Glucometer.  STrips and lancets to test twice daily.   Dx 250.00. 1 Units 2  . calcium carbonate (OS-CAL) 600 MG TABS Take 600 mg by mouth 2 (two) times daily with a  meal.      . celecoxib (CELEBREX) 200 MG capsule TAKE 1 TO 2 CAPSULES BY MOUTH ONCE DAILY AS NEEDED FOR PAIN 180 capsule 0  . EUTHYROX 100 MCG tablet Take 1 tablet by mouth once daily 90 tablet 0  . fish oil-omega-3 fatty acids 1000 MG capsule Take 2 g by mouth daily.      . furosemide (LASIX) 20 MG tablet Take 1 tablet by mouth once daily 90 tablet 0  . glucose blood (ONETOUCH ULTRA) test strip USE 1 STRIP TO CHECK GLUCOSE  DAILY 100 each 3  . lisinopril (ZESTRIL) 20 MG tablet Take 1 tablet by mouth once daily 90 tablet 1  . metFORMIN (GLUCOPHAGE) 500 MG tablet TAKE 1 TABLET BY MOUTH TWICE DAILY WITH MEALS 180 tablet 3  . Multiple Vitamin (THERA) TABS Take 1 tablet by mouth.    . simvastatin (ZOCOR) 20 MG tablet Take 1 tablet (20 mg total) by mouth at bedtime. 90 tablet 3  . traMADol (ULTRAM) 50 MG tablet TAKE 1 TO 2 TABLETS BY MOUTH EVERY 8 HOURS AS NEEDED FOR PAIN . DO NOT EXCEED 6 PER 24 HOURS 90 tablet 0   No facility-administered medications prior to visit.    Allergies  Allergen Reactions  . Latex Rash  . Tape Rash    ROS Review of Systems    Objective:    Physical Exam  Constitutional: She is oriented to person, place, and time. She appears well-developed and well-nourished.  HENT:  Head: Normocephalic and atraumatic.  Cardiovascular: Normal rate, regular rhythm and normal heart sounds.  Pulmonary/Chest: Effort normal and breath sounds normal.  Neurological: She is alert and oriented to person, place, and time.  Skin: Skin is warm and dry.  Psychiatric: She has a normal mood and affect. Her behavior is normal.    BP (!) 124/53   Pulse 65   Ht 5\' 3"  (1.6 m)   Wt 293 lb (132.9 kg)   SpO2 99%   BMI 51.90 kg/m  Wt Readings from Last 3 Encounters:  01/30/20 293 lb (132.9 kg)  10/02/19 285 lb (129.3 kg)  09/18/19 291 lb (132 kg)     Health Maintenance Due  Topic Date Due  . COLONOSCOPY  09/22/2019    There are no preventive care reminders to display for this  patient.  Lab Results  Component Value Date   TSH 2.12 05/31/2019   Lab Results  Component Value Date   WBC 6.5 04/03/2015   HGB 13.5 04/03/2015   HCT 40.3 04/03/2015   MCV 89.4 04/03/2015   PLT  345 04/03/2015   Lab Results  Component Value Date   NA 138 05/31/2019   K 4.7 05/31/2019   CO2 26 05/31/2019   GLUCOSE 99 05/31/2019   BUN 28 (H) 05/31/2019   CREATININE 1.02 (H) 05/31/2019   BILITOT 0.4 05/31/2019   ALKPHOS 97 06/14/2017   AST 19 05/31/2019   ALT 17 05/31/2019   PROT 6.4 05/31/2019   ALBUMIN 4.2 06/14/2017   CALCIUM 9.6 05/31/2019   Lab Results  Component Value Date   CHOL 201 (H) 05/31/2019   Lab Results  Component Value Date   HDL 66 05/31/2019   Lab Results  Component Value Date   LDLCALC 112 (H) 05/31/2019   Lab Results  Component Value Date   TRIG 122 05/31/2019   Lab Results  Component Value Date   CHOLHDL 3.0 05/31/2019   Lab Results  Component Value Date   HGBA1C 6.2 (A) 01/30/2020      Assessment & Plan:   Problem List Items Addressed This Visit      Cardiovascular and Mediastinum   Essential hypertension    Well controlled. Continue current regimen. Follow up in  6 mo      Relevant Orders   BASIC METABOLIC PANEL WITH GFR     Endocrine   Hypothyroidism    Last TSH looked great.   We will plan to recheck again today.      Relevant Orders   TSH   Diabetes mellitus ([place]) - Primary    A1C of 6.2 today which looks fantastic.  Up just slightly from previous.  She has been having feeling a little lightheaded or dizzy around 11 or 12:00 right before lunch.  When she checks her blood sugar is about 10-15 points lower than her first for a fasting morning sugar.  She says usually if she eats something she will feel better pretty quickly.      Relevant Orders   POCT glycosylated hemoglobin (Hb A1C) (Completed)   BASIC METABOLIC PANEL WITH GFR     Genitourinary   CKD stage G3a/A1, GFR 45-59 and albumin creatinine ratio <30 mg/g     To recheck renal function.  Checking every 6 months.      Relevant Orders   BASIC METABOLIC PANEL WITH GFR     Other   Encounter for chronic pain management    Indication for chronic opioid: bilat knee and hip OA Medication and dose: Tramadol 50mg  TID # pills per month: 90 Last UDS date: 01/30/2020 Opioid Treatment Agreement signed (Y/N): Y Opioid Treatment Agreement last reviewed with patient:   NCCSRS reviewed this encounter (include red flags):  Y        Relevant Orders   Pain Mgmt, Profile 5 w/o medMatch U      No orders of the defined types were placed in this encounter.   Follow-up: Return in about 4 months (around 05/31/2020) for Diabetes follow-up, Hypertension.    [name], MD

## 2020-01-30 NOTE — Assessment & Plan Note (Signed)
To recheck renal function.  Checking every 6 months.

## 2020-01-30 NOTE — Assessment & Plan Note (Signed)
Well controlled. Continue current regimen. Follow up in  6 mo  

## 2020-01-30 NOTE — Assessment & Plan Note (Signed)
Last TSH looked great.   We will plan to recheck again today.

## 2020-01-31 LAB — PAIN MGMT, PROFILE 5 W/O MEDMATCH U
Amphetamines: NEGATIVE ng/mL
Barbiturates: NEGATIVE ng/mL
Benzodiazepines: NEGATIVE ng/mL
Cocaine Metabolite: NEGATIVE ng/mL
Creatinine: 22.6 mg/dL
Marijuana Metabolite: NEGATIVE ng/mL
Methadone Metabolite: NEGATIVE ng/mL
Opiates: NEGATIVE ng/mL
Oxidant: NEGATIVE ug/mL
Oxycodone: NEGATIVE ng/mL
pH: 5 (ref 4.5–9.0)

## 2020-01-31 LAB — BASIC METABOLIC PANEL WITHOUT GFR
BUN/Creatinine Ratio: 27 (calc) — ABNORMAL HIGH (ref 6–22)
BUN: 26 mg/dL — ABNORMAL HIGH (ref 7–25)
CO2: 27 mmol/L (ref 20–32)
Calcium: 9.9 mg/dL (ref 8.6–10.4)
Chloride: 105 mmol/L (ref 98–110)
Creat: 0.97 mg/dL — ABNORMAL HIGH (ref 0.60–0.93)
GFR, Est African American: 68 mL/min/1.73m2
GFR, Est Non African American: 59 mL/min/1.73m2 — ABNORMAL LOW
Glucose, Bld: 115 mg/dL — ABNORMAL HIGH (ref 65–99)
Potassium: 4.7 mmol/L (ref 3.5–5.3)
Sodium: 142 mmol/L (ref 135–146)

## 2020-01-31 LAB — TSH: TSH: 0.82 mIU/L (ref 0.40–4.50)

## 2020-02-05 ENCOUNTER — Other Ambulatory Visit: Payer: Self-pay | Admitting: Family Medicine

## 2020-02-05 DIAGNOSIS — M47816 Spondylosis without myelopathy or radiculopathy, lumbar region: Secondary | ICD-10-CM

## 2020-03-11 ENCOUNTER — Other Ambulatory Visit: Payer: Self-pay | Admitting: Family Medicine

## 2020-03-25 ENCOUNTER — Other Ambulatory Visit: Payer: Self-pay | Admitting: Family Medicine

## 2020-03-25 DIAGNOSIS — M47816 Spondylosis without myelopathy or radiculopathy, lumbar region: Secondary | ICD-10-CM

## 2020-04-22 ENCOUNTER — Other Ambulatory Visit: Payer: Self-pay | Admitting: Family Medicine

## 2020-05-07 ENCOUNTER — Other Ambulatory Visit: Payer: Self-pay | Admitting: *Deleted

## 2020-05-07 DIAGNOSIS — M47816 Spondylosis without myelopathy or radiculopathy, lumbar region: Secondary | ICD-10-CM

## 2020-05-07 MED ORDER — TRAMADOL HCL 50 MG PO TABS: 50.0000 mg | ORAL_TABLET | Freq: Three times a day (TID) | ORAL | 0 refills | Status: DC | PRN

## 2020-05-13 ENCOUNTER — Other Ambulatory Visit: Payer: Self-pay | Admitting: Family Medicine

## 2020-06-02 ENCOUNTER — Ambulatory Visit (INDEPENDENT_AMBULATORY_CARE_PROVIDER_SITE_OTHER): Payer: Medicare Other | Admitting: Family Medicine

## 2020-06-02 ENCOUNTER — Encounter: Payer: Self-pay | Admitting: Family Medicine

## 2020-06-02 VITALS — BP 126/47 | HR 77 | Ht 63.0 in | Wt 290.0 lb

## 2020-06-02 DIAGNOSIS — Z1211 Encounter for screening for malignant neoplasm of colon: Secondary | ICD-10-CM | POA: Diagnosis not present

## 2020-06-02 DIAGNOSIS — G8929 Other chronic pain: Secondary | ICD-10-CM

## 2020-06-02 DIAGNOSIS — E119 Type 2 diabetes mellitus without complications: Secondary | ICD-10-CM | POA: Diagnosis not present

## 2020-06-02 DIAGNOSIS — E039 Hypothyroidism, unspecified: Secondary | ICD-10-CM

## 2020-06-02 DIAGNOSIS — Z6841 Body Mass Index (BMI) 40.0 and over, adult: Secondary | ICD-10-CM

## 2020-06-02 DIAGNOSIS — I10 Essential (primary) hypertension: Secondary | ICD-10-CM | POA: Diagnosis not present

## 2020-06-02 DIAGNOSIS — M47816 Spondylosis without myelopathy or radiculopathy, lumbar region: Secondary | ICD-10-CM | POA: Diagnosis not present

## 2020-06-02 LAB — COMPLETE METABOLIC PANEL WITH GFR
AG Ratio: 2.2 (calc) (ref 1.0–2.5)
ALT: 23 U/L (ref 6–29)
AST: 22 U/L (ref 10–35)
Albumin: 4.4 g/dL (ref 3.6–5.1)
Alkaline phosphatase (APISO): 98 U/L (ref 37–153)
BUN/Creatinine Ratio: 30 (calc) — ABNORMAL HIGH (ref 6–22)
BUN: 28 mg/dL — ABNORMAL HIGH (ref 7–25)
CO2: 27 mmol/L (ref 20–32)
Calcium: 9.9 mg/dL (ref 8.6–10.4)
Chloride: 108 mmol/L (ref 98–110)
Creat: 0.94 mg/dL — ABNORMAL HIGH (ref 0.60–0.93)
GFR, Est African American: 71 mL/min/{1.73_m2} (ref >=60)
GFR, Est Non African American: 61 mL/min/{1.73_m2} (ref >=60)
Globulin: 2 g/dL (calc) (ref 1.9–3.7)
Glucose, Bld: 119 mg/dL — ABNORMAL HIGH (ref 65–99)
Potassium: 4.6 mmol/L (ref 3.5–5.3)
Sodium: 141 mmol/L (ref 135–146)
Total Bilirubin: 0.5 mg/dL (ref 0.2–1.2)
Total Protein: 6.4 g/dL (ref 6.1–8.1)

## 2020-06-02 LAB — POCT GLYCOSYLATED HEMOGLOBIN (HGB A1C): Hemoglobin A1C: 6.2 % — AB (ref 4.0–5.6)

## 2020-06-02 LAB — TSH: TSH: 1.26 mIU/L (ref 0.40–4.50)

## 2020-06-02 LAB — LIPID PANEL
Cholesterol: 185 mg/dL (ref <200)
HDL: 69 mg/dL (ref >=50)
LDL Cholesterol (Calc): 96 mg/dL (calc)
Non-HDL Cholesterol (Calc): 116 mg/dL (calc) (ref <130)
Total CHOL/HDL Ratio: 2.7 (calc) (ref <5.0)
Triglycerides: 102 mg/dL (ref <150)

## 2020-06-02 MED ORDER — LEVOTHYROXINE SODIUM 100 MCG PO TABS: 100.0000 ug | ORAL_TABLET | Freq: Every day | ORAL | 0 refills | Status: DC

## 2020-06-02 MED ORDER — METFORMIN HCL 500 MG PO TABS: 500.0000 mg | ORAL_TABLET | Freq: Two times a day (BID) | ORAL | 0 refills | Status: DC

## 2020-06-02 MED ORDER — TRAMADOL HCL 50 MG PO TABS: 50.0000 mg | ORAL_TABLET | Freq: Three times a day (TID) | ORAL | 0 refills | Status: DC | PRN

## 2020-06-02 MED ORDER — ONETOUCH ULTRA VI STRP: ORAL_STRIP | 3 refills | Status: DC

## 2020-06-02 NOTE — Assessment & Plan Note (Signed)
Well controlled. Continue current regimen. Follow up in  6 mo  

## 2020-06-02 NOTE — Progress Notes (Signed)
Established Patient Office Visit  Subjective:  Patient ID: Regina Houston, female    DOB: 1951/08/04  Age: 69 y.o. MRN: [id number]  CC:  Chief Complaint  Patient presents with  . Diabetes  . Hypertension    HPI Regina Houston presents for   Diabetes - no hypoglycemic events. No wounds or sores that are not healing well. No increased thirst or urination. Checking glucose at home. Taking medications as prescribed without any side effects.  Hypothyroidism - Taking medication regularly in the AM away from food and vitamins, etc. No recent change to skin, hair, or energy levels.  She says she is been trying to eat more vegetables like cucumbers and tomatoes this summer and is actually lost 3 pounds.  F/U Chronic pain mgt - due for refill on her tramadol. Doing well on medication.   Had her colonoscopy done last year.    Past Medical History:  Diagnosis Date  . Diabetes mellitus 2007  . Diverticulitis   . Hyperlipidemia   . Obesity     Past Surgical History:  Procedure Laterality Date  . ABDOMINAL HYSTERECTOMY  2008   fibroids  . BIOPSY BREAST  2007   benign calcification  . 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 BREAST BIOPSY Left    needle core biopsy, benign  . Capsulotomy MPJ Right 12/31/2016   RT FOOT, 3rd MPJ  . FOOT SURGERY  2004   hammer toe  . Hammer Toe Repair Right 12/31/2016   RT #3, #5 toes    Family History  Problem Relation Age of Onset  . Hypertension Mother   . Stroke Mother   . Diabetes Mother   . Heart disease Mother 41  . Kidney disease Father        kidney failure  . Heart disease Father 62  . Heart disease Maternal Grandmother        MI  . Diabetes Maternal Grandmother     Social History   Socioeconomic History  . Marital status: Single    Spouse name: Not on file  . Number of children: 0  . Years of education: BA  . Highest education level: Bachelor's degree (e.g., BA, AB, BS)  Occupational History  . Occupation:  retired    Comment: [employer]  Tobacco Use  . Smoking status: Never Smoker  . Smokeless tobacco: Never Used  Vaping Use  . Vaping Use: Never used  Substance and Sexual Activity  . Alcohol use: No  . Drug use: No  . Sexual activity: Not Currently  Other Topics Concern  . Not on file  Social History Narrative   Walks  2 times a week for 30 minutes a day. Patient has retired from [employer] and enjoying that   Social Determinants of Radio broadcast assistant Strain:   . Difficulty of Paying Living Expenses:   Food Insecurity:   . Worried About Charity fundraiser in the Last Year:   . Arboriculturist in the Last Year:   Transportation Needs:   . Film/video editor (Medical):   Marland Kitchen Lack of Transportation (Non-Medical):   Physical Activity: Unknown  . Days of Exercise per Week: 3 days  . Minutes of Exercise per Session: Not on file  Stress:   . Feeling of Stress :   Social Connections: Unknown  . Frequency of Communication with Friends and Family: Not on file  . Frequency of Social Gatherings with Friends and Family: Three times  a week  . Attends Religious Services: Not on file  . Active Member of Clubs or Organizations: Not on file  . Attends Archivist Meetings: Not on file  . Marital Status: Never married  Intimate Partner Violence:   . Fear of Current or Ex-Partner:   . Emotionally Abused:   Marland Kitchen Physically Abused:   . Sexually Abused:     Outpatient Medications Prior to Visit  Medication Sig Dispense Refill  . AMBULATORY NON FORMULARY MEDICATION Medication Name: Glucometer.  STrips and lancets to test twice daily.   Dx 250.00. 1 Units 2  . calcium carbonate (OS-CAL) 600 MG TABS Take 600 mg by mouth 2 (two) times daily with a meal.      . celecoxib (CELEBREX) 200 MG capsule TAKE 1 TO 2 CAPSULES BY MOUTH ONCE DAILY AS NEEDED FOR PAIN 180 capsule 0  . fish oil-omega-3 fatty acids 1000 MG capsule Take 2 g by mouth daily.      . furosemide (LASIX) 20 MG tablet  Take 1 tablet by mouth once daily 90 tablet 0  . lisinopril (ZESTRIL) 20 MG tablet Take 1 tablet by mouth once daily 90 tablet 0  . Multiple Vitamin (THERA) TABS Take 1 tablet by mouth.    . simvastatin (ZOCOR) 20 MG tablet Take 1 tablet (20 mg total) by mouth at bedtime. 90 tablet 3  . EUTHYROX 100 MCG tablet Take 1 tablet by mouth once daily 90 tablet 0  . glucose blood (ONETOUCH ULTRA) test strip USE 1 STRIP TO CHECK GLUCOSE  DAILY 100 each 3  . metFORMIN (GLUCOPHAGE) 500 MG tablet TAKE 1 TABLET BY MOUTH TWICE DAILY WITH MEALS 180 tablet 0  . traMADol (ULTRAM) 50 MG tablet Take 1 tablet (50 mg total) by mouth every 8 (eight) hours as needed for severe pain. 90 tablet 0   No facility-administered medications prior to visit.    Allergies  Allergen Reactions  . Latex Rash  . Tape Rash    ROS Review of Systems    Objective:    Physical Exam Constitutional:      Appearance: She is well-developed.  HENT:     Head: Normocephalic and atraumatic.  Cardiovascular:     Rate and Rhythm: Normal rate and regular rhythm.     Heart sounds: Normal heart sounds.  Pulmonary:     Effort: Pulmonary effort is normal.     Breath sounds: Normal breath sounds.  Skin:    General: Skin is warm and dry.  Neurological:     Mental Status: She is alert and oriented to person, place, and time.  Psychiatric:        Behavior: Behavior normal.     BP (!) 126/47   Pulse 77   Ht 5\' 3"  (1.6 m)   Wt 290 lb (131.5 kg)   SpO2 99%   BMI 51.37 kg/m  Wt Readings from Last 3 Encounters:  06/02/20 290 lb (131.5 kg)  01/30/20 293 lb (132.9 kg)  10/02/19 285 lb (129.3 kg)     There are no preventive care reminders to display for this patient.  There are no preventive care reminders to display for this patient.  Lab Results  Component Value Date   TSH 0.82 01/30/2020   Lab Results  Component Value Date   WBC 6.5 04/03/2015   HGB 13.5 04/03/2015   HCT 40.3 04/03/2015   MCV 89.4 04/03/2015    PLT 345 04/03/2015   Lab Results  Component Value Date  NA 141 06/02/2020   K 4.6 06/02/2020   CO2 27 06/02/2020   GLUCOSE 119 (H) 06/02/2020   BUN 28 (H) 06/02/2020   CREATININE 0.94 (H) 06/02/2020   BILITOT 0.5 06/02/2020   ALKPHOS 97 06/14/2017   AST 22 06/02/2020   ALT 23 06/02/2020   PROT 6.4 06/02/2020   ALBUMIN 4.2 06/14/2017   CALCIUM 9.9 06/02/2020   Lab Results  Component Value Date   CHOL 185 06/02/2020   Lab Results  Component Value Date   HDL 69 06/02/2020   Lab Results  Component Value Date   LDLCALC 96 06/02/2020   Lab Results  Component Value Date   TRIG 102 06/02/2020   Lab Results  Component Value Date   CHOLHDL 2.7 06/02/2020   Lab Results  Component Value Date   HGBA1C 6.2 (A) 06/02/2020      Assessment & Plan:   Problem List Items Addressed This Visit      Cardiovascular and Mediastinum   Essential hypertension    Well controlled. Continue current regimen. Follow up in  6 mo      Relevant Orders   COMPLETE METABOLIC PANEL WITH GFR (Completed)   Lipid panel (Completed)     Endocrine   Hypothyroidism    Sxs well controlled.  Plan to recheck TSH.       Relevant Medications   levothyroxine (EUTHYROX) 100 MCG tablet   Other Relevant Orders   TSH   Diabetes mellitus ([place]) - Primary    Well controlled. Continue current regimen. Follow up in  4 mo.       Relevant Medications   metFORMIN (GLUCOPHAGE) 500 MG tablet   glucose blood (ONETOUCH ULTRA) test strip   Other Relevant Orders   POCT glycosylated hemoglobin (Hb A1C) (Completed)     Musculoskeletal and Integument   Lumbar spondylosis   Relevant Medications   traMADol (ULTRAM) 50 MG tablet     Other   Encounter for chronic pain management     Indication for chronic opioid: bilat knee and hip OA Medication and dose: Tramadol 50mg  TID # pills per month: 90 Last UDS date: 01/30/2020 Opioid Treatment Agreement signed (Y/N): Y Opioid Treatment Agreement last reviewed  with patient:  01/2020 NCCSRS reviewed this encounter (include red flags):  Y          Other Visit Diagnoses    Encounter for screening colonoscopy       BMI 50.0-59.9, adult ([place])       Relevant Medications   metFORMIN (GLUCOPHAGE) 500 MG tablet     BMI 50 - she has done a great job with weight loss. Continue to work on [employer] and exercise.    Meds ordered this encounter  Medications  . levothyroxine (EUTHYROX) 100 MCG tablet    Sig: Take 1 tablet (100 mcg total) by mouth daily.    Dispense:  90 tablet    Refill:  0  . metFORMIN (GLUCOPHAGE) 500 MG tablet    Sig: Take 1 tablet (500 mg total) by mouth 2 (two) times daily with a meal.    Dispense:  180 tablet    Refill:  0  . glucose blood (ONETOUCH ULTRA) test strip    Sig: USE 1 STRIP TO CHECK GLUCOSE  DAILY    Dispense:  100 each    Refill:  3    Disregard previous rx that says BID  . traMADol (ULTRAM) 50 MG tablet    Sig: Take 1 tablet (50 mg  total) by mouth every 8 (eight) hours as needed for severe pain.    Dispense:  90 tablet    Refill:  0    Follow-up: Return in about 4 months (around 10/03/2020) for Diabetes follow-up and pain management.    [name], MD

## 2020-06-02 NOTE — Assessment & Plan Note (Addendum)
Sxs well controlled.  Plan to recheck TSH.

## 2020-06-02 NOTE — Assessment & Plan Note (Addendum)
Indication for chronic opioid: bilat knee and hip OA Medication and dose: Tramadol 50mg  TID # pills per month: 90 Last UDS date: 01/30/2020 Opioid Treatment Agreement signed (Y/N): Y Opioid Treatment Agreement last reviewed with patient:  01/2020 NCCSRS reviewed this encounter (include red flags):  Regina Houston

## 2020-06-02 NOTE — Assessment & Plan Note (Signed)
Well controlled. Continue current regimen. Follow up in  4 mo 

## 2020-07-28 ENCOUNTER — Other Ambulatory Visit: Payer: Self-pay | Admitting: Nurse Practitioner

## 2020-08-05 ENCOUNTER — Other Ambulatory Visit: Payer: Self-pay | Admitting: Family Medicine

## 2020-08-05 DIAGNOSIS — M47816 Spondylosis without myelopathy or radiculopathy, lumbar region: Secondary | ICD-10-CM

## 2020-08-12 ENCOUNTER — Other Ambulatory Visit: Payer: Self-pay | Admitting: Family Medicine

## 2020-08-12 DIAGNOSIS — M47816 Spondylosis without myelopathy or radiculopathy, lumbar region: Secondary | ICD-10-CM

## 2020-08-20 ENCOUNTER — Ambulatory Visit (INDEPENDENT_AMBULATORY_CARE_PROVIDER_SITE_OTHER): Payer: Medicare Other | Admitting: Physician Assistant

## 2020-08-20 VITALS — Temp 98.9°F

## 2020-08-20 DIAGNOSIS — Z23 Encounter for immunization: Secondary | ICD-10-CM

## 2020-08-20 NOTE — Progress Notes (Signed)
Patient here today to get Flu shot.  Tolerated well.

## 2020-09-03 ENCOUNTER — Other Ambulatory Visit: Payer: Self-pay | Admitting: Family Medicine

## 2020-09-03 DIAGNOSIS — Z1231 Encounter for screening mammogram for malignant neoplasm of breast: Secondary | ICD-10-CM

## 2020-09-08 ENCOUNTER — Other Ambulatory Visit: Payer: Self-pay | Admitting: Family Medicine

## 2020-09-08 DIAGNOSIS — E039 Hypothyroidism, unspecified: Secondary | ICD-10-CM

## 2020-09-08 DIAGNOSIS — E119 Type 2 diabetes mellitus without complications: Secondary | ICD-10-CM

## 2020-09-13 DIAGNOSIS — Z23 Encounter for immunization: Secondary | ICD-10-CM | POA: Diagnosis not present

## 2020-09-22 ENCOUNTER — Ambulatory Visit: Payer: Medicare Other

## 2020-09-22 ENCOUNTER — Encounter: Payer: Self-pay | Admitting: Medical-Surgical

## 2020-09-22 ENCOUNTER — Ambulatory Visit (INDEPENDENT_AMBULATORY_CARE_PROVIDER_SITE_OTHER): Payer: Medicare Other | Admitting: Medical-Surgical

## 2020-09-22 VITALS — BP 117/75 | HR 65 | Temp 98.0°F | Ht 64.0 in | Wt 293.1 lb

## 2020-09-22 DIAGNOSIS — Z0000 Encounter for general adult medical examination without abnormal findings: Secondary | ICD-10-CM | POA: Diagnosis not present

## 2020-09-22 NOTE — Progress Notes (Signed)
Subjective:   Regina Houston is a 69 y.o. female who presents for Medicare Annual (Subsequent) preventive examination.  Review of Systems    Negative for fever, chills, chest pain, shortness of breath, dizziness, headaches, and GI complaints.   Has been gardening and has a lot of tomatoes that she will likely be canning.        Objective:    Today's Vitals   09/22/20 0857 09/22/20 0901  BP: 117/75   Pulse: 65   Temp: 98 F (36.7 C)   TempSrc: Oral   SpO2: 96%   Weight: 293 lb 1.6 oz (132.9 kg)   Height: 5\' 4"  (1.626 m)   PainSc: 4  4    Body mass index is 50.31 kg/m.  Advanced Directives 09/22/2020 09/18/2019 09/12/2018 06/15/2016 03/04/2016 04/03/2015 01/22/2015  Does Patient Have a Medical Advance Directive? Yes Yes Yes No Yes Yes Yes  Type of Paramedic of [place];Living will;Out of facility DNR (pink MOST or yellow form) [place];Living will [place];Living will - - Living will [place];Living will  Does patient want to make changes to medical advance directive? No - Patient declined No - Patient declined No - Patient declined - - - -  Copy of [place] in Chart? Yes - validated most recent copy scanned in chart (See row information) No - copy requested No - copy requested - No - copy requested No - copy requested Yes  Would patient like information on creating a medical advance directive? - - - No - patient declined information - - -    Current Medications (verified) Outpatient Encounter Medications as of 09/22/2020  Medication Sig  . AMBULATORY NON FORMULARY MEDICATION Medication Name: Glucometer.  STrips and lancets to test twice daily.   Dx 250.00.  . calcium carbonate (OS-CAL) 600 MG TABS Take 600 mg by mouth 2 (two) times daily with a meal.    . celecoxib (CELEBREX) 200 MG capsule TAKE 1 TO 2 CAPSULES BY MOUTH ONCE DAILY AS NEEDED FOR PAIN  . EUTHYROX 100 MCG tablet  Take 1 tablet by mouth once daily  . fish oil-omega-3 fatty acids 1000 MG capsule Take 2 g by mouth daily.    . furosemide (LASIX) 20 MG tablet Take 1 tablet by mouth once daily  . glucose blood (ONETOUCH ULTRA) test strip USE 1 STRIP TO CHECK GLUCOSE  DAILY  . lisinopril (ZESTRIL) 20 MG tablet Take 1 tablet by mouth once daily  . metFORMIN (GLUCOPHAGE) 500 MG tablet TAKE 1 TABLET BY MOUTH TWICE DAILY WITH MEALS  . Multiple Vitamin (THERA) TABS Take 1 tablet by mouth.  . simvastatin (ZOCOR) 20 MG tablet Take 1 tablet (20 mg total) by mouth at bedtime.  . traMADol (ULTRAM) 50 MG tablet TAKE 1 TABLET BY MOUTH EVERY 8 HOURS AS NEEDED FOR SEVERE PAIN   No facility-administered encounter medications on file as of 09/22/2020.    Allergies (verified) Latex and Tape   History: Past Medical History:  Diagnosis Date  . Diabetes mellitus 2007  . Diverticulitis   . Hyperlipidemia   . Obesity    Past Surgical History:  Procedure Laterality Date  . ABDOMINAL HYSTERECTOMY  2008   fibroids  . BIOPSY BREAST  2007   benign calcification  . 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 BREAST BIOPSY Left    needle core biopsy, benign  . Capsulotomy MPJ Right 12/31/2016  RT FOOT, 3rd MPJ  . FOOT SURGERY  2004   hammer toe  . Hammer Toe Repair Right 12/31/2016   RT #3, #5 toes   Family History  Problem Relation Age of Onset  . Hypertension Mother   . Stroke Mother   . Diabetes Mother   . Heart disease Mother 4  . Kidney disease Father        kidney failure  . Heart disease Father 38  . Heart disease Maternal Grandmother        MI  . Diabetes Maternal Grandmother    Social History   Socioeconomic History  . Marital status: Single    Spouse name: Not on file  . Number of children: 0  . Years of education: BA  . Highest education level: Bachelor's degree (e.g., BA, AB, BS)  Occupational History  . Occupation: retired    Comment: [employer]  Tobacco Use  . Smoking  status: Never Smoker  . Smokeless tobacco: Never Used  Vaping Use  . Vaping Use: Never used  Substance and Sexual Activity  . Alcohol use: No  . Drug use: No  . Sexual activity: Not Currently  Other Topics Concern  . Not on file  Social History Narrative   Walks  2 times a week for 30 minutes a day. Patient has retired from [employer] and enjoying that   Social Determinants of Radio broadcast assistant Strain:   . Difficulty of Paying Living Expenses: Not on file  Food Insecurity:   . Worried About Charity fundraiser in the Last Year: Not on file  . Ran Out of Food in the Last Year: Not on file  Transportation Needs:   . Lack of Transportation (Medical): Not on file  . Lack of Transportation (Non-Medical): Not on file  Physical Activity:   . Days of Exercise per Week: Not on file  . Minutes of Exercise per Session: Not on file  Stress:   . Feeling of Stress : Not on file  Social Connections:   . Frequency of Communication with Friends and Family: Not on file  . Frequency of Social Gatherings with Friends and Family: Not on file  . Attends Religious Services: Not on file  . Active Member of Clubs or Organizations: Not on file  . Attends Archivist Meetings: Not on file  . Marital Status: Not on file    Tobacco Counseling Counseling given: Not Answered   Clinical Intake:  Pre-visit preparation completed: Yes  Pain : 0-10 Pain Score: 4  Pain Type: Chronic pain Pain Location: Back Pain Onset: More than a month ago Pain Frequency: Intermittent     BMI - recorded: 50.31 Nutritional Status: BMI > 30  Obese Diabetes: Yes  How often do you need to have someone help you when you read instructions, pamphlets, or other written materials from your doctor or pharmacy?: 1 - Never What is the last grade level you completed in school?: Bachelors degree  Diabetic? Yes  Interpreter Needed?: No  Information entered by :: [place] [facility] Daily  Living In your present state of health, do you have any difficulty performing the following activities: 09/22/2020  Hearing? N  Vision? N  Difficulty concentrating or making decisions? N  Walking or climbing stairs? N  Dressing or bathing? N  Doing errands, shopping? N  Preparing Food and eating ? N  Using the Toilet? N  In the past six months, have you accidently leaked urine? N  Do you have problems with loss of bowel control? N  Managing your Medications? N  Managing your Finances? N  Housekeeping or managing your Housekeeping? N  Some recent data might be hidden    Patient Care Team: Hali Marry, MD as PCP - General  Indicate any recent Medical Services you may have received from other than [facility] providers in the past year (date may be approximate).     Assessment:   This is a routine wellness examination for Regina Houston.  Hearing/Vision screen No exam data present  Dietary issues and exercise activities discussed: Current Exercise Habits: Home exercise routine, Type of exercise: stretching;walking, Time (Minutes): 20, Frequency (Times/Week): 7, Weekly Exercise (Minutes/Week): 140, Intensity: Mild  Goals    . Exercise 150 min/wk Moderate Activity     Start strength training more. Continue to do brain stimulating exercises like your crosswords.    . Weight (lb) < 200 lb (90.7 kg)     Patient states would like to loose up to 100lbs      Depression Screen PHQ 2/9 Scores 09/22/2020 09/22/2020 09/18/2019 05/31/2019 02/15/2019 09/12/2018 06/13/2018  PHQ - 2 Score 0 0 0 1 0 0 1    Fall Risk Fall Risk  09/22/2020 10/02/2019 09/18/2019 05/31/2019 09/12/2018  Falls in the past year? 1 0 0 0 No  Number falls in past yr: 0 0 0 0 -  Injury with Fall? 0 0 0 0 -  Risk for fall due to : - - - - -  Follow up Falls evaluation completed - Falls prevention discussed - -    Any stairs in or around the home? No  If so, are there any without handrails? NA Home free of loose throw rugs  in walkways, pet beds, electrical cords, etc? rug in the bathroom Adequate lighting in your home to reduce risk of falls? Yes   ASSISTIVE DEVICES UTILIZED TO PREVENT FALLS:  Life alert? No  Use of a cane, walker or w/c? No  Grab bars in the bathroom? No, looking at having a walk in shower with grab bars installed Shower chair or bench in shower? No  Elevated toilet seat or a handicapped toilet? No, interested in getting a taller one  Gait steady and fast without use of assistive device  Cognitive Function:     6CIT Screen 09/22/2020 09/18/2019 09/12/2018  What Year? 0 points 0 points 0 points  What month? 0 points 0 points 0 points  What time? 0 points 0 points 0 points  Count back from 20 0 points 0 points 0 points  Months in reverse 0 points 0 points 0 points  Repeat phrase 0 points 0 points 0 points  Total Score 0 0 0    Immunizations Immunization History  Administered Date(s) Administered  . DTP 11/22/2006  . Fluad Quad(high Dose 65+) 07/25/2019, 08/20/2020  . Influenza Split 10/10/2012  . Influenza Whole 11/22/2006, 10/01/2009, 08/22/2010  . Influenza, High Dose Seasonal PF 09/02/2018  . Influenza,inj,Quad PF,6+ Mos 08/28/2014, 08/26/2015, 08/10/2016  . Influenza-Unspecified 10/01/2013, 09/02/2017, 09/02/2018  . Moderna SARS-COVID-2 Vaccination 12/18/2019, 01/18/2020, 09/13/2020  . Pneumococcal Conjugate-13 02/19/2014  . Pneumococcal Polysaccharide-23 11/22/2005, 04/03/2015  . Td 11/22/2006  . Tdap 02/22/2017  . Zoster 05/04/2011  . Zoster Recombinat (Shingrix) 01/23/2019, 03/20/2019    TDAP status: Up to date Flu Vaccine status: Up to date Pneumococcal vaccine status: Up to date Covid-19 vaccine status: Completed vaccines  Qualifies for Shingles Vaccine? No   Zostavax completed Yes   Shingrix Completed?:  Yes  Screening Tests Health Maintenance  Topic Date Due  . OPHTHALMOLOGY EXAM  11/19/2020  . HEMOGLOBIN A1C  12/03/2020  . FOOT EXAM  06/02/2021  .  MAMMOGRAM  10/09/2021  . DEXA SCAN  07/24/2024  . COLONOSCOPY  09/30/2024  . TETANUS/TDAP  02/23/2027  . INFLUENZA VACCINE  Completed  . COVID-19 Vaccine  Completed  . Hepatitis C Screening  Completed  . PNA vac Low Risk Adult  Completed    Health Maintenance  There are no preventive care reminders to display for this patient.  Colorectal cancer screening: Completed 2020. Repeat every 10 years Mammogram status: Ordered by Dr. Madilyn Fireman. Marland Kitchen Pt provided with contact info and advised to call to schedule appt.  Bone Density status: Completed 07/2019. Results reflect: Bone density results: NORMAL. Repeat every 5 years.  Lung Cancer Screening: (Low Dose CT Chest recommended if Age 43-80 years, 30 pack-year currently smoking OR have quit w/in 15years.) does not qualify.   Lung Cancer Screening Referral: NA  Additional Screening:  Hepatitis C Screening: does not qualify; Completed 2016  Vision Screening: Recommended annual ophthalmology exams for early detection of glaucoma and other disorders of the eye. Is the patient up to date with their annual eye exam?  Yes  Who is the provider or what is the name of the office in which the patient attends annual eye exams? [facility], Dr. Jodi Mourning If pt is not established with a provider, would they like to be referred to a provider to establish care? No .   Dental Screening: Recommended annual dental exams for proper oral hygiene  Community Resource Referral / Chronic Care Management: CRR required this visit?  No   CCM required this visit?  No    Plan:     Regina Houston , Thank you for taking time to come for your Medicare Wellness Visit. I appreciate your ongoing commitment to your health goals. Please review the following plan we discussed and let me know if I can assist you in the future.   These are the goals we discussed: Goals    . Exercise 150 min/wk Moderate Activity     Start strength training more. Continue to do brain stimulating  exercises like your crosswords.    . Weight (lb) < 200 lb (90.7 kg)     Patient states would like to loose up to 100lbs       This is a list of the screening recommended for you and due dates:  Health Maintenance  Topic Date Due  . Eye exam for diabetics  11/19/2020  . Hemoglobin A1C  12/03/2020  . Complete foot exam   06/02/2021  . Mammogram  10/09/2021  . DEXA scan (bone density measurement)  07/24/2024  . Colon Cancer Screening  09/30/2024  . Tetanus Vaccine  02/23/2027  . Flu Shot  Completed  . COVID-19 Vaccine  Completed  .  Hepatitis C: One time screening is recommended by Center for Disease Control  (CDC) for  adults born from [birth year] through 1967.   Completed  . Pneumonia vaccines  Completed    I have personally reviewed and noted the following in the patient's chart:   . Medical and social history . Use of alcohol, tobacco or illicit drugs  . Current medications and supplements . Functional ability and status . Nutritional status . Physical activity . Advanced directives . List of other physicians . Hospitalizations, surgeries, and ER visits in previous 12 months . Vitals . Screenings to include cognitive, depression,  and falls . Referrals and appointments  In addition, I have reviewed and discussed with patient certain preventive protocols, quality metrics, and best practice recommendations. A written personalized care plan for preventive services as well as general preventive health recommendations were provided to patient.   Clearnce Sorrel, DNP, APRN, FNP-BC [facility]

## 2020-09-22 NOTE — Patient Instructions (Signed)
  Ms. Klose , Thank you for taking time to come for your Medicare Wellness Visit. I appreciate your ongoing commitment to your health goals. Please review the following plan we discussed and let me know if I can assist you in the future.   These are the goals we discussed: Goals    . Exercise 150 min/wk Moderate Activity     Start strength training more. Continue to do brain stimulating exercises like your crosswords.    . Weight (lb) < 200 lb (90.7 kg)     Patient states would like to loose up to 100lbs       This is a list of the screening recommended for you and due dates:  Health Maintenance  Topic Date Due  . Eye exam for diabetics  11/19/2020  . Hemoglobin A1C  12/03/2020  . Complete foot exam   06/02/2021  . Mammogram  10/09/2021  . DEXA scan (bone density measurement)  07/24/2024  . Colon Cancer Screening  09/30/2024  . Tetanus Vaccine  02/23/2027  . Flu Shot  Completed  . COVID-19 Vaccine  Completed  .  Hepatitis C: One time screening is recommended by Center for Disease Control  (CDC) for  adults born from [birth year] through 1967.   Completed  . Pneumonia vaccines  Completed

## 2020-09-23 ENCOUNTER — Other Ambulatory Visit: Payer: Self-pay | Admitting: Family Medicine

## 2020-09-23 DIAGNOSIS — M47816 Spondylosis without myelopathy or radiculopathy, lumbar region: Secondary | ICD-10-CM

## 2020-09-29 ENCOUNTER — Other Ambulatory Visit: Payer: Self-pay | Admitting: Family Medicine

## 2020-09-29 DIAGNOSIS — E785 Hyperlipidemia, unspecified: Secondary | ICD-10-CM

## 2020-10-06 ENCOUNTER — Ambulatory Visit (INDEPENDENT_AMBULATORY_CARE_PROVIDER_SITE_OTHER): Payer: Medicare Other | Admitting: Family Medicine

## 2020-10-06 ENCOUNTER — Encounter: Payer: Self-pay | Admitting: Family Medicine

## 2020-10-06 ENCOUNTER — Other Ambulatory Visit: Payer: Self-pay

## 2020-10-06 VITALS — BP 134/64 | HR 68 | Ht 64.0 in | Wt 293.0 lb

## 2020-10-06 DIAGNOSIS — E119 Type 2 diabetes mellitus without complications: Secondary | ICD-10-CM

## 2020-10-06 DIAGNOSIS — M47816 Spondylosis without myelopathy or radiculopathy, lumbar region: Secondary | ICD-10-CM

## 2020-10-06 DIAGNOSIS — H9192 Unspecified hearing loss, left ear: Secondary | ICD-10-CM | POA: Diagnosis not present

## 2020-10-06 DIAGNOSIS — M545 Low back pain, unspecified: Secondary | ICD-10-CM | POA: Diagnosis not present

## 2020-10-06 DIAGNOSIS — G8929 Other chronic pain: Secondary | ICD-10-CM

## 2020-10-06 LAB — POCT UA - MICROALBUMIN
Albumin/Creatinine Ratio, Urine, POC: >30
Creatinine, POC: 50 mg/dL
Microalbumin Ur, POC: 10 mg/L

## 2020-10-06 LAB — POCT GLYCOSYLATED HEMOGLOBIN (HGB A1C): Hemoglobin A1C: 6.2 % — AB (ref 4.0–5.6)
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
## 2020-10-06 NOTE — Assessment & Plan Note (Signed)
Well controlled. Continue current regimen. Follow up in  4 months.   Lab Results  Component Value Date   HGBA1C 6.2 (A) 10/06/2020

## 2020-10-06 NOTE — Assessment & Plan Note (Addendum)
Indication for chronic opioid:bilat knee and hip OA Medication and dose:Tramadol 50mg  TID # pills per month: 90 Last UDS date:10/06/2020 Opioid Treatment Agreement signed (Y/N):Y Opioid Treatment Agreement last reviewed with patient: 01/2020 N[facility]SRS reviewed this encounter (include red flags):Y Follow-up: 4 months.

## 2020-10-06 NOTE — Assessment & Plan Note (Signed)
Discussed getting her into formal physical therapy for some sessions to try to get ahead of some of the pain and flaring.  If not improving can always consider referral back to [facility] for further treatment.

## 2020-10-07 LAB — DRUG MONITORING, PANEL 6 WITH CONFIRMATION, URINE
6 Acetylmorphine: NEGATIVE ng/mL (ref <10)
Alcohol Metabolites: NEGATIVE ng/mL
Amphetamines: NEGATIVE ng/mL (ref <500)
Barbiturates: NEGATIVE ng/mL (ref <300)
Benzodiazepines: NEGATIVE ng/mL (ref <100)
Cocaine Metabolite: NEGATIVE ng/mL (ref <150)
Creatinine: 48.1 mg/dL
Marijuana Metabolite: NEGATIVE ng/mL (ref <20)
Methadone Metabolite: NEGATIVE ng/mL (ref <100)
Opiates: NEGATIVE ng/mL (ref <100)
Oxidant: NEGATIVE ug/mL
Oxycodone: NEGATIVE ng/mL (ref <100)
Phencyclidine: NEGATIVE ng/mL (ref <25)
pH: 5.9 (ref 4.5–9.0)

## 2020-10-07 LAB — DM TEMPLATE

## 2020-10-15 ENCOUNTER — Ambulatory Visit: Payer: Medicare Other

## 2020-10-21 ENCOUNTER — Ambulatory Visit (INDEPENDENT_AMBULATORY_CARE_PROVIDER_SITE_OTHER): Payer: Medicare Other | Admitting: Physical Therapy

## 2020-10-21 ENCOUNTER — Other Ambulatory Visit: Payer: Self-pay

## 2020-10-21 ENCOUNTER — Encounter: Payer: Self-pay | Admitting: Physical Therapy

## 2020-10-21 DIAGNOSIS — M545 Low back pain, unspecified: Secondary | ICD-10-CM | POA: Diagnosis not present

## 2020-10-21 DIAGNOSIS — G8929 Other chronic pain: Secondary | ICD-10-CM | POA: Diagnosis not present

## 2020-10-21 NOTE — Patient Instructions (Signed)
Access Code: [email or web address] URL: [email or web address] Date: 10/21/2020 Prepared by: Almyra Free  Exercises Supine Bridge - 2 x daily - 7 x weekly - 1-3 sets - 10 reps Supine Lower Trunk Rotation - 2 x daily - 7 x weekly - 1 sets - 5 reps - 10 sec hold Supine Hamstring Stretch with Strap - 2 x daily - 7 x weekly - 1 sets - 3 reps - 60 sec hold Sit to Stand - 2 x daily - 7 x weekly - 1 sets - 10 reps

## 2020-10-21 NOTE — Therapy (Signed)
[place], [place], [address] Phone: [phone]   Fax:  [phone]  Physical Therapy Evaluation  Patient Details  Name: Regina Houston MRN: [id number] Date of Birth: 1951-01-02 Referring Provider (PT): [name] MD   Encounter Date: 10/21/2020   PT End of Session - 10/21/20 0933    Visit Number 1    Date for PT Re-Evaluation 12/16/20    Authorization Type Medicare    Progress Note Due on Visit 10    PT Start Time 0933    PT Stop Time 1014    PT Time Calculation (min) 41 min    Activity Tolerance Patient tolerated treatment well    Behavior During Therapy [facility] for tasks assessed/performed           Past Medical History:  Diagnosis Date  . Diabetes mellitus 2007  . Diverticulitis   . Hyperlipidemia   . Obesity     Past Surgical History:  Procedure Laterality Date  . ABDOMINAL HYSTERECTOMY  2008   fibroids  . BIOPSY BREAST  2007   benign calcification  . 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 BREAST BIOPSY Left    needle core biopsy, benign  . Capsulotomy MPJ Right 12/31/2016   RT FOOT, 3rd MPJ  . FOOT SURGERY  2004   hammer toe  . Hammer Toe Repair Right 12/31/2016   RT #3, #5 toes    There were no vitals filed for this visit.    Subjective Assessment - 10/21/20 0936    Subjective Patient feels like she is getting stiffer in her back and legs. Starting to hear some pops and they hurt a little. Stairs are difficult.    Pertinent History DM, low back pain, OA, HTN, Rt foot surgery    Patient Stated Goals Get limber and build up stamina    Currently in Pain? Yes    Pain Score 5     Pain Location Back    Pain Orientation Lower    Pain Descriptors / Indicators Aching    Pain Type Chronic pain    Pain Onset More than a month ago    Pain Frequency Constant    Aggravating Factors  turning and moving; transition movements              [facility] PT  Assessment - 10/21/20 0001      Assessment   Medical Diagnosis chronic LBP without sciatica    Referring Provider (PT) [name] MD    Onset Date/Surgical Date 11/23/19    Hand Dominance Right    Next MD Visit March    Prior Therapy yes      Precautions   Precautions None      Restrictions   Weight Bearing Restrictions No      Balance Screen   Has the patient fallen in the past 6 months Yes    How many times? 1   tripped in the yard   Has the patient had a decrease in activity level because of a fear of falling?  No    Is the patient reluctant to leave their home because of a fear of falling?  No      Home Social worker Private residence    Living Arrangements Alone    Additional Comments front porch 2 steps      Prior Function   Level of [place] Retired  Leisure does her own yard work      Observation/Other Assessments   Focus on Therapeutic Outcomes (FOTO)  43% limited (goal 38%)      Functional Tests   Functional tests Step up;Step down;Sit to Stand      Step Up   Comments weak on Rt LE      Step Down   Comments WFL      Sit to Stand   Comments [facility]      Posture/Postural Control   Posture/Postural Control No significant limitations      ROM / Strength   AROM / PROM / Strength AROM;Strength      AROM   Overall AROM Comments Lumbar WFL; some pain on left with Right SB and rotates some; mild tightness bil with rotation Lt>Rt; BLE WNL      Strength   Overall Strength Comments core weakness and pain with resisted hip flexion    Strength Assessment Site Hip    Right/Left Hip Right;Left    Right Hip Flexion 5/5    Right Hip Extension 4+/5    Right Hip External Rotation  5/5    Right Hip Internal Rotation 4/5    Right Hip ABduction 4-/5    Left Hip Flexion 5/5    Left Hip Extension 5/5    Left Hip External Rotation 5/5    Left Hip Internal Rotation 4/5    Left Hip ABduction 4/5      Flexibility    Soft Tissue Assessment /Muscle Length yes    Hamstrings mild right HS      Palpation   Spinal mobility WNL; some right bil tightness lumbar    Palpation comment bil gluteals and piriformis Left >Rt                       Objective measurements completed on examination: See above findings.               PT Education - 10/21/20 1331    Education Details HEP    Person(s) Educated Patient    Methods Explanation;Demonstration;Handout    Comprehension Verbalized understanding;Returned demonstration            PT Short Term Goals - 10/21/20 1336      PT SHORT TERM GOAL #1   Title Ind with initial HEP    Time 3    Period Weeks    Status New    Target Date 11/11/20             PT Long Term Goals - 10/21/20 1337      PT LONG TERM GOAL #1   Title I with HEP    Time 8    Period Weeks    Status New    Target Date 12/16/20      PT LONG TERM GOAL #2   Title report overall pain decrease =/> 75% with daily activity    Time 8    Period Weeks    Status New      PT LONG TERM GOAL #3   Title increase strength Rt hip =/> 5-/5 to support body with standing and transitional moevments    Time 8    Period Weeks    Status New      PT LONG TERM GOAL #4   Title improve FOTO =/< 38% limited    Time 8    Period Weeks    Status New      PT LONG TERM  GOAL #5   Title Patient able to climb stairs with a reciprocal gait pattern without difficulty.    Time 8    Period Weeks    Status New                  Plan - 10/21/20 1014    Clinical Impression Statement Patient presents with c/o tightness in her low back and increased pain. She has functional ROM but bil tightness in her lumbar spine. She also has some weakness in BLE especially on the right leg with steps. She reports fatiguing easily with stairs and using a step to gait. She reports one fall in her yard recently when she tripped on something. She was able to get up on her own. She will benefit  from PT to address these deficits.    Personal Factors and Comorbidities Comorbidity 3+    Comorbidities DM, low back pain, OA, HTN, Rt foot surgery    Examination-Activity Limitations Stairs    Stability/Clinical Decision Making Stable/Uncomplicated    Clinical Decision Making Low    Rehab Potential Excellent    PT Frequency 2x / week    PT Duration 8 weeks   due to holidays   PT Treatment/Interventions ADLs/Self Care Home Management;Aquatic Therapy;Cryotherapy;Electrical Stimulation;Moist Heat;Therapeutic exercise;Therapeutic activities;Neuromuscular re-education;Manual techniques;Dry needling;Patient/family education;Taping    PT Next Visit Plan step ups/down; sit to stand, BLE strength and core/lumbar stab    PT Home Exercise Plan [email or web address]    Consulted and Agree with Plan of Care Patient           Patient will benefit from skilled therapeutic intervention in order to improve the following deficits and impairments:  Pain, Impaired flexibility, Decreased strength, Increased muscle spasms  Visit Diagnosis: Chronic midline low back pain without sciatica - Plan: PT plan of care cert/re-cert     Problem List Patient Active Problem List   Diagnosis Date Noted  . Hearing loss of left ear 10/06/2020  . Encounter for chronic pain management 01/30/2020  . CKD stage G3a/A1, GFR 45-59 and albumin creatinine ratio <30 mg/g ([place]) 10/02/2019  . Left foot pain 01/10/2015  . Severe obesity (BMI >= 40) ([place]) 08/28/2014  . Cataract 08/12/2014  . Hypothyroidism 05/28/2014  . Lumbar spondylosis 02/26/2014  . HLD (hyperlipidemia) 02/01/2014  . Essential hypertension 09/20/2011  . Osteoarthritis of both knees 09/20/2011  . MORBID OBESITY 04/22/2010  . POSTMENOPAUSAL STATUS 04/22/2010  . TROCHANTERIC BURSITIS, RIGHT 12/03/2009  . Goiter 04/01/2009  . Diabetes mellitus ([place]) 04/01/2009    Madelyn Flavors PT 10/21/2020, 1:40 PM  [facility]  [place], [place], [address] Phone: [phone]   Fax:  [phone]  Name: Regina Houston MRN: [id number] Date of Birth: 09/19/1951

## 2020-10-23 ENCOUNTER — Ambulatory Visit (INDEPENDENT_AMBULATORY_CARE_PROVIDER_SITE_OTHER): Payer: Medicare Other | Admitting: Physical Therapy

## 2020-10-23 ENCOUNTER — Other Ambulatory Visit: Payer: Self-pay

## 2020-10-23 ENCOUNTER — Encounter: Payer: Self-pay | Admitting: Physical Therapy

## 2020-10-23 DIAGNOSIS — G8929 Other chronic pain: Secondary | ICD-10-CM | POA: Diagnosis not present

## 2020-10-23 DIAGNOSIS — M545 Low back pain, unspecified: Secondary | ICD-10-CM | POA: Diagnosis not present

## 2020-10-23 NOTE — Patient Instructions (Signed)
Access Code: [email or web address] 'URL: [email or web address]: 12/02/2021Prepared by: [facility]erc[facility]  Supine Bridge - 2 x daily - 7 x weekly - 1-3 sets - 10 reps  Supine Lower Trunk Rotation - 2 x daily - 7 x weekly - 1 sets - 5 reps - 10 sec hold  Sit to Stand - 2 x daily - 7 x weekly - 1 sets - 10 reps  Hooklying Hamstring Stretch with Strap - 2 x daily - 7 x weekly - 1 sets - 2-3 reps - 30 sec hold  Supine Piriformis Stretch with Foot on Ground - 2 x daily - 7 x weekly - 1 sets - 2 reps - 20 sec hold

## 2020-10-23 NOTE — Therapy (Signed)
[place], [place], [address] Phone: [phone]   Fax:  [phone]  Physical Therapy Treatment  Patient Details  Name: Regina Houston MRN: [id number] Date of Birth: 06-29-1951 Referring Provider (PT): [name] MD   Encounter Date: 10/23/2020   PT End of Session - 10/23/20 1150    Visit Number 2    Date for PT Re-Evaluation 12/16/20    Authorization Type Medicare    Progress Note Due on Visit 10    PT Start Time 1150    PT Stop Time 1228    PT Time Calculation (min) 38 min    Activity Tolerance Patient tolerated treatment well    Behavior During Therapy [facility] for tasks assessed/performed           Past Medical History:  Diagnosis Date  . Diabetes mellitus 2007  . Diverticulitis   . Hyperlipidemia   . Obesity     Past Surgical History:  Procedure Laterality Date  . ABDOMINAL HYSTERECTOMY  2008   fibroids  . BIOPSY BREAST  2007   benign calcification  . 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 BREAST BIOPSY Left    needle core biopsy, benign  . Capsulotomy MPJ Right 12/31/2016   RT FOOT, 3rd MPJ  . FOOT SURGERY  2004   hammer toe  . Hammer Toe Repair Right 12/31/2016   RT #3, #5 toes    There were no vitals filed for this visit.   Subjective Assessment - 10/23/20 1152    Subjective Pt reports she did her exercises this morning, but lower back is still sore.    Pertinent History DM, low back pain, OA, HTN, Rt foot surgery    Patient Stated Goals Get limber and build up stamina    Currently in Pain? Yes    Pain Score 5     Pain Location Back    Pain Orientation Lower    Pain Descriptors / Indicators Aching    Aggravating Factors  turning, transitional movements    Pain Relieving Factors recliner.              [facility] PT Assessment - 10/23/20 0001      Assessment   Medical Diagnosis chronic LBP without sciatica    Referring Provider (PT) [name] MD    Onset Date/Surgical Date 11/23/19    Hand Dominance Right    Next MD Visit March    Prior Therapy yes            [facility] Adult PT Treatment/Exercise - 10/23/20 0001      Self-Care   Self-Care Other Self-Care Comments    Other Self-Care Comments  encouraged pt to use her TENS with MHP at home for pain control;issued new set of electrodes.        Exercises   Exercises Lumbar      Lumbar Exercises: Stretches   Passive Hamstring Stretch Right;Left;2 reps;30 seconds   supine with strap. Hooklying.    Piriformis Stretch Right;Left;2 reps;20 seconds   fig 4 with strap assist     Lumbar Exercises: Aerobic   Nustep L4: legs only x 5 min      Lumbar Exercises: Standing   Other Standing Lumbar Exercises Rt/Lt hip ext and hip abdct x 10 reps each with UE support on counter       Lumbar Exercises: Seated   Sit to Stand 5 reps   no UE support, core  engaged.      Lumbar Exercises: Supine   Bridge 10 reps;3 seconds                    PT Short Term Goals - 10/21/20 1336      PT SHORT TERM GOAL #1   Title Ind with initial HEP    Time 3    Period Weeks    Status New    Target Date 11/11/20             PT Long Term Goals - 10/21/20 1337      PT LONG TERM GOAL #1   Title I with HEP    Time 8    Period Weeks    Status New    Target Date 12/16/20      PT LONG TERM GOAL #2   Title report overall pain decrease =/> 75% with daily activity    Time 8    Period Weeks    Status New      PT LONG TERM GOAL #3   Title increase strength Rt hip =/> 5-/5 to support body with standing and transitional moevments    Time 8    Period Weeks    Status New      PT LONG TERM GOAL #4   Title improve FOTO =/< 38% limited    Time 8    Period Weeks    Status New      PT LONG TERM GOAL #5   Title Patient able to climb stairs with a reciprocal gait pattern without difficulty.    Time 8    Period Weeks    Status New                 Plan - 10/23/20 1241     Clinical Impression Statement Pt tolerated all exercises well, reporting decrease in pain in LB to 3/10 at end of session.  Added piriformis stretch to HEP with strap assist. VC for core engagement with transitional movements with positive response.   Pt has TENS unit at home; encouraged her to utilize it to assist with pain control. Goals are ongoing.    Personal Factors and Comorbidities Comorbidity 3+    Comorbidities DM, low back pain, OA, HTN, Rt foot surgery    Examination-Activity Limitations Stairs    Stability/Clinical Decision Making Stable/Uncomplicated    Rehab Potential Excellent    PT Frequency 2x / week    PT Duration 8 weeks   due to holidays   PT Treatment/Interventions ADLs/Self Care Home Management;Aquatic Therapy;Cryotherapy;Electrical Stimulation;Moist Heat;Therapeutic exercise;Therapeutic activities;Neuromuscular re-education;Manual techniques;Dry needling;Patient/family education;Taping    PT Next Visit Plan step ups/down; sit to stand, BLE strength and core/lumbar stab.  review sit to/from supine via log roll.    PT Home Exercise Plan [email or web address]    Consulted and Agree with Plan of Care Patient           Patient will benefit from skilled therapeutic intervention in order to improve the following deficits and impairments:  Pain, Impaired flexibility, Decreased strength, Increased muscle spasms  Visit Diagnosis: Chronic midline low back pain without sciatica     Problem List Patient Active Problem List   Diagnosis Date Noted  . Hearing loss of left ear 10/06/2020  . Encounter for chronic pain management 01/30/2020  . CKD stage G3a/A1, GFR 45-59 and albumin creatinine ratio <30 mg/g ([place]) 10/02/2019  . Left foot pain 01/10/2015  . Severe obesity (BMI >= 40) ([place]) 08/28/2014  .  Cataract 08/12/2014  . Hypothyroidism 05/28/2014  . Lumbar spondylosis 02/26/2014  . HLD (hyperlipidemia) 02/01/2014  . Essential hypertension 09/20/2011  . Osteoarthritis of both  knees 09/20/2011  . MORBID OBESITY 04/22/2010  . POSTMENOPAUSAL STATUS 04/22/2010  . TROCHANTERIC BURSITIS, RIGHT 12/03/2009  . Goiter 04/01/2009  . Diabetes mellitus ([place]) 04/01/2009   Kerin Perna, PTA 10/23/20 12:45 PM  [place], [place], [address] Phone: [phone]   Fax:  [phone]  Name: Regina Houston MRN: [id number] Date of Birth: 1950/12/04

## 2020-10-29 ENCOUNTER — Ambulatory Visit (INDEPENDENT_AMBULATORY_CARE_PROVIDER_SITE_OTHER): Payer: Medicare Other | Admitting: Physical Therapy

## 2020-10-29 ENCOUNTER — Encounter: Payer: Self-pay | Admitting: Physical Therapy

## 2020-10-29 ENCOUNTER — Other Ambulatory Visit: Payer: Self-pay

## 2020-10-29 DIAGNOSIS — G8929 Other chronic pain: Secondary | ICD-10-CM

## 2020-10-29 DIAGNOSIS — M545 Low back pain, unspecified: Secondary | ICD-10-CM

## 2020-10-29 NOTE — Therapy (Signed)
[place], [place], [address] Phone: [phone]   Fax:  [phone]  Physical Therapy Treatment  Patient Details  Name: Regina Houston MRN: [id number] Date of Birth: [birth year] Referring Provider (PT): [name] MD   Encounter Date: 10/29/2020   PT End of Session - 10/29/20 1207    Visit Number 3    Date for PT Re-Evaluation 12/16/20    Authorization Type Medicare    Progress Note Due on Visit 10    PT Start Time 1104    PT Stop Time 1145    PT Time Calculation (min) 41 min    Activity Tolerance Patient tolerated treatment well    Behavior During Therapy [facility] for tasks assessed/performed           Past Medical History:  Diagnosis Date  . Diabetes mellitus 2007  . Diverticulitis   . Hyperlipidemia   . Obesity     Past Surgical History:  Procedure Laterality Date  . ABDOMINAL HYSTERECTOMY  2008   fibroids  . BIOPSY BREAST  2007   benign calcification  . 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 BREAST BIOPSY Left    needle core biopsy, benign  . Capsulotomy MPJ Right 12/31/2016   RT FOOT, 3rd MPJ  . FOOT SURGERY  2004   hammer toe  . Hammer Toe Repair Right 12/31/2016   RT #3, #5 toes    There were no vitals filed for this visit.   Subjective Assessment - 10/29/20 1107    Subjective Pt reports she was feeling better after last visit, but then she bought a puppy.   This has flared up her back (frequent bending over). Overall she has noticed improvement in mobility at home.  She has used TENS 1x with good relief.    Patient Stated Goals Get limber and build up stamina    Currently in Pain? Yes    Pain Score 6     Pain Location Back    Pain Orientation Lower    Pain Descriptors / Indicators Aching    Aggravating Factors  prolonged standing    Pain Relieving Factors recliner, TENS.              [facility] PT Assessment - 10/29/20 0001      Assessment    Medical Diagnosis chronic LBP without sciatica    Referring Provider (PT) [name] MD    Onset Date/Surgical Date 11/23/19    Hand Dominance Right    Next MD Visit March    Prior Therapy yes            [facility] Adult PT Treatment/Exercise - 10/29/20 0001      Lumbar Exercises: Stretches   Passive Hamstring Stretch Right;Left;2 reps;30 seconds   supine with strap. Hooklying.    Lower Trunk Rotation 60 seconds   rocking back and forth, arms in T   Prone on Elbows Stretch 2 reps;10 seconds    Quad Stretch Right;Left;2 reps;20 seconds   prone with strap   Quad Stretch Limitations 1 rep in seated position, with foot under chair.     Piriformis Stretch Right;Left;2 reps;20 seconds   pulling knee towards opp shoulder.      Lumbar Exercises: Aerobic   Nustep L4: legs only x 5 min    Other Aerobic Exercise single laps around gym to assess response to exercises.       Lumbar Exercises: Supine   Bridge 10  reps;3 seconds   no cramping in HS today.      Lumbar Exercises: Sidelying   Clam Right;Left;10 reps    Hip Abduction Right;Left;10 reps;3 seconds      Lumbar Exercises: Prone   Opposite Arm/Leg Raise Right arm/Left leg;Left arm/Right leg;5 reps                 PT Short Term Goals - 10/21/20 1336      PT SHORT TERM GOAL #1   Title Ind with initial HEP    Time 3    Period Weeks    Status New    Target Date 11/11/20             PT Long Term Goals - 10/21/20 1337      PT LONG TERM GOAL #1   Title I with HEP    Time 8    Period Weeks    Status New    Target Date 12/16/20      PT LONG TERM GOAL #2   Title report overall pain decrease =/> 75% with daily activity    Time 8    Period Weeks    Status New      PT LONG TERM GOAL #3   Title increase strength Rt hip =/> 5-/5 to support body with standing and transitional moevments    Time 8    Period Weeks    Status New      PT LONG TERM GOAL #4   Title improve FOTO =/< 38% limited    Time 8    Period  Weeks    Status New      PT LONG TERM GOAL #5   Title Patient able to climb stairs with a reciprocal gait pattern without difficulty.    Time 8    Period Weeks    Status New                 Plan - 10/29/20 1131    Clinical Impression Statement Pt reported decrease in LBP during exercises.  Limited tolerance for LUE/RLE lift in prone, due to increase in R LBP; reduced again with rest. Some "twinges" in Rt ant knee with stairs.  Some reduction of pain with quad stretch.  LBP reduced to 4/10 at end of session. Encouraged pt to use TENS at home to assist with pain control.   Goals are ongoing.    Personal Factors and Comorbidities Comorbidity 3+    Comorbidities DM, low back pain, OA, HTN, Rt foot surgery    Examination-Activity Limitations Stairs    Stability/Clinical Decision Making Stable/Uncomplicated    Rehab Potential Excellent    PT Frequency 2x / week    PT Duration 8 weeks   due to holidays   PT Treatment/Interventions ADLs/Self Care Home Management;Aquatic Therapy;Cryotherapy;Electrical Stimulation;Moist Heat;Therapeutic exercise;Therapeutic activities;Neuromuscular re-education;Manual techniques;Dry needling;Patient/family education;Taping    PT Next Visit Plan continue BLE strength and core/lumbar stab.    PT Home Exercise Plan [email or web address]    Consulted and Agree with Plan of Care Patient           Patient will benefit from skilled therapeutic intervention in order to improve the following deficits and impairments:  Pain, Impaired flexibility, Decreased strength, Increased muscle spasms  Visit Diagnosis: Chronic midline low back pain without sciatica     Problem List Patient Active Problem List   Diagnosis Date Noted  . Hearing loss of left ear 10/06/2020  . Encounter for chronic pain management 01/30/2020  .  CKD stage G3a/A1, GFR 45-59 and albumin creatinine ratio <30 mg/g ([place]) 10/02/2019  . Left foot pain 01/10/2015  . Severe obesity (BMI >= 40) ([place])  08/28/2014  . Cataract 08/12/2014  . Hypothyroidism 05/28/2014  . Lumbar spondylosis 02/26/2014  . HLD (hyperlipidemia) 02/01/2014  . Essential hypertension 09/20/2011  . Osteoarthritis of both knees 09/20/2011  . MORBID OBESITY 04/22/2010  . POSTMENOPAUSAL STATUS 04/22/2010  . TROCHANTERIC BURSITIS, RIGHT 12/03/2009  . Goiter 04/01/2009  . Diabetes mellitus ([place]) 04/01/2009   Kerin Perna, PTA 10/29/20 12:10 PM  [place], [place], [address] Phone: [phone]   Fax:  [phone]  Name: Regina Houston MRN: [id number] Date of Birth: 25-Mar-1951

## 2020-10-30 ENCOUNTER — Other Ambulatory Visit: Payer: Self-pay | Admitting: Nurse Practitioner

## 2020-10-31 ENCOUNTER — Ambulatory Visit (INDEPENDENT_AMBULATORY_CARE_PROVIDER_SITE_OTHER): Payer: Medicare Other | Admitting: Physical Therapy

## 2020-10-31 ENCOUNTER — Other Ambulatory Visit: Payer: Self-pay

## 2020-10-31 ENCOUNTER — Encounter: Payer: Self-pay | Admitting: Physical Therapy

## 2020-10-31 DIAGNOSIS — M545 Low back pain, unspecified: Secondary | ICD-10-CM | POA: Diagnosis not present

## 2020-10-31 DIAGNOSIS — G8929 Other chronic pain: Secondary | ICD-10-CM | POA: Diagnosis not present

## 2020-10-31 NOTE — Therapy (Signed)
[place], [place], [address] Phone: [phone]   Fax:  [phone]  Physical Therapy Treatment  Patient Details  Name: Regina Houston MRN: [id number] Date of Birth: [birth year] Referring Provider (PT): [name] MD   Encounter Date: 10/31/2020   PT End of Session - 10/31/20 0853    Visit Number 4    Date for PT Re-Evaluation 12/16/20    Authorization Type Medicare    Authorization - Visit Number 4    Progress Note Due on Visit 10    PT Start Time 0849    PT Stop Time 0930    PT Time Calculation (min) 41 min    Activity Tolerance Patient tolerated treatment well    Behavior During Therapy [facility] for tasks assessed/performed           Past Medical History:  Diagnosis Date  . Diabetes mellitus 2007  . Diverticulitis   . Hyperlipidemia   . Obesity     Past Surgical History:  Procedure Laterality Date  . ABDOMINAL HYSTERECTOMY  2008   fibroids  . BIOPSY BREAST  2007   benign calcification  . 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 BREAST BIOPSY Left    needle core biopsy, benign  . Capsulotomy MPJ Right 12/31/2016   RT FOOT, 3rd MPJ  . FOOT SURGERY  2004   hammer toe  . Hammer Toe Repair Right 12/31/2016   RT #3, #5 toes    There were no vitals filed for this visit.   Subjective Assessment - 10/31/20 0851    Subjective "I'm stiff as a board".  She has used TENS a few times with some relief.  She reports her back is not popping as much as it had and transitions are getting easier.    Patient Stated Goals Get limber and build up stamina    Currently in Pain? Yes    Pain Score 4     Pain Location Back    Pain Orientation Lower    Pain Descriptors / Indicators Tightness;Dull              [facility] PT Assessment - 10/31/20 0001      Assessment   Medical Diagnosis chronic LBP without sciatica    Referring Provider (PT) [name] MD    Onset  Date/Surgical Date 11/23/19    Hand Dominance Right    Next MD Visit March    Prior Therapy yes      Flexibility   Soft Tissue Assessment /Muscle Length yes    Quadriceps Rt 97 deg (after 2 stretches). Lt 95 deg.             [place] Adult PT Treatment/Exercise - 10/31/20 0001      Lumbar Exercises: Stretches   Passive Hamstring Stretch Right;Left;2 reps;30 seconds   supine with strap. Hooklying.    Lower Trunk Rotation 60 seconds   rocking back and forth, arms in T   Prone on Elbows Stretch 2 reps;10 seconds    Quad Stretch Right;Left;2 reps;20 seconds   prone with strap   Piriformis Stretch Right;Left;2 reps;20 seconds    Piriformis Stretch Limitations modified pigeon on table; tight on LLE      Lumbar Exercises: Aerobic   Nustep L4: legs only x 5 min    Other Aerobic Exercise single laps around gym to assess response to exercises.       Lumbar Exercises: Seated   Sit to  Stand 5 reps   no UE support, core engaged.      Lumbar Exercises: Supine   Bent Knee Raise 5 reps;3 seconds   each leg with core set   Bridge 5 reps;3 seconds      Lumbar Exercises: Sidelying   Clam Right;Left;10 reps    Hip Abduction Right;Left;10 reps;3 seconds      Lumbar Exercises: Prone   Opposite Arm/Leg Raise Right arm/Left leg;Left arm/Right leg;5 reps      Knee/Hip Exercises: Standing   Forward Step Up Both;1 set;10 reps;Step Height: 6";Hand Hold: 2    Step Down Both;1 set;10 reps;Step Height: 4";Hand Hold: 2   and retro step up                       PT Short Term Goals - 10/21/20 1336      PT SHORT TERM GOAL #1   Title Ind with initial HEP    Time 3    Period Weeks    Status New    Target Date 11/11/20             PT Long Term Goals - 10/31/20 0909      PT LONG TERM GOAL #1   Title I with HEP    Time 8    Period Weeks    Status On-going      PT LONG TERM GOAL #2   Title report overall pain decrease =/> 75% with daily activity    Time 8    Period Weeks     Status On-going      PT LONG TERM GOAL #3   Title increase strength Rt hip =/> 5-/5 to support body with standing and transitional moevments    Time 8    Period Weeks    Status On-going      PT LONG TERM GOAL #4   Title improve FOTO =/< 38% limited    Time 8    Period Weeks    Status On-going      PT LONG TERM GOAL #5   Title Patient able to climb stairs with a reciprocal gait pattern without difficulty.    Time 8    Period Weeks    Status On-going                 Plan - 10/31/20 0909    Clinical Impression Statement Pt reported feeling "looser" after completing exercises today.  Trial of modified pigeon pose; Lt hip tighter and she mostly reported increased Rt LB discomfort.  Stairs went well today.  Some "twinging" in Rt knee with stairs and sit to/from stand.  Pt reported pain in LBP to 3/10 by end of session. Progressing towards goals.    Personal Factors and Comorbidities Comorbidity 3+    Comorbidities DM, low back pain, OA, HTN, Rt foot surgery    Examination-Activity Limitations Stairs    Stability/Clinical Decision Making Stable/Uncomplicated    Rehab Potential Excellent    PT Frequency 2x / week    PT Duration 8 weeks   due to holidays   PT Treatment/Interventions ADLs/Self Care Home Management;Aquatic Therapy;Cryotherapy;Electrical Stimulation;Moist Heat;Therapeutic exercise;Therapeutic activities;Neuromuscular re-education;Manual techniques;Dry needling;Patient/family education;Taping    PT Next Visit Plan continue BLE strength and core/lumbar stab. Progress HEP as tolerated.    PT Home Exercise Plan [email or web address]    Consulted and Agree with Plan of Care Patient           Patient will benefit from skilled therapeutic  intervention in order to improve the following deficits and impairments:  Pain,Impaired flexibility,Decreased strength,Increased muscle spasms  Visit Diagnosis: Chronic midline low back pain without sciatica     Problem List Patient Active  Problem List   Diagnosis Date Noted  . Hearing loss of left ear 10/06/2020  . Encounter for chronic pain management 01/30/2020  . CKD stage G3a/A1, GFR 45-59 and albumin creatinine ratio <30 mg/g ([place]) 10/02/2019  . Left foot pain 01/10/2015  . Severe obesity (BMI >= 40) ([place]) 08/28/2014  . Cataract 08/12/2014  . Hypothyroidism 05/28/2014  . Lumbar spondylosis 02/26/2014  . HLD (hyperlipidemia) 02/01/2014  . Essential hypertension 09/20/2011  . Osteoarthritis of both knees 09/20/2011  . MORBID OBESITY 04/22/2010  . POSTMENOPAUSAL STATUS 04/22/2010  . TROCHANTERIC BURSITIS, RIGHT 12/03/2009  . Goiter 04/01/2009  . Diabetes mellitus ([place]) 04/01/2009   Kerin Perna, PTA 10/31/20 9:34 AM  [facility] [place], [place], [address] Phone: [phone]   Fax:  [phone]  Name: Regina Houston MRN: [id number] Date of Birth: 1951-05-06

## 2020-11-03 ENCOUNTER — Encounter: Payer: Self-pay | Admitting: Physical Therapy

## 2020-11-03 ENCOUNTER — Ambulatory Visit (INDEPENDENT_AMBULATORY_CARE_PROVIDER_SITE_OTHER): Payer: Medicare Other | Admitting: Physical Therapy

## 2020-11-03 ENCOUNTER — Other Ambulatory Visit: Payer: Self-pay

## 2020-11-03 DIAGNOSIS — M545 Low back pain, unspecified: Secondary | ICD-10-CM | POA: Diagnosis not present

## 2020-11-03 DIAGNOSIS — G8929 Other chronic pain: Secondary | ICD-10-CM | POA: Diagnosis not present

## 2020-11-03 NOTE — Patient Instructions (Signed)
Access Code: [email or web address] URL: [email or web address] Date: 11/03/2020 Prepared by: Almyra Free  Exercises Supine Bridge - 1 x daily - 7 x weekly - 1-2 sets - 5-10 reps Supine Lower Trunk Rotation - 2 x daily - 7 x weekly - 1 sets - 5 reps - 10 sec hold Hooklying Hamstring Stretch with Strap - 2 x daily - 7 x weekly - 1 sets - 2-3 reps - 30 sec hold Supine Piriformis Stretch with Foot on Ground - 2 x daily - 7 x weekly - 1 sets - 2 reps - 20 sec hold Sidelying Hip Abduction - 1 x daily - 7 x weekly - 1 sets - 10 reps Clamshell - 1 x daily - 7 x weekly - 1 sets - 10 reps Sit to Stand - 2 x daily - 7 x weekly - 1 sets - 5 reps Seated Hip Flexor Stretch - 2 x daily - 7 x weekly - 3 reps - 1 sets - 30-60 sec hold

## 2020-11-03 NOTE — Therapy (Signed)
[place], [place], [address] Phone: [phone]   Fax:  [phone]  Physical Therapy Treatment  Patient Details  Name: Regina Houston MRN: [id number] Date of Birth: 05/01/1951 Referring Provider (PT): [name] MD   Encounter Date: 11/03/2020   PT End of Session - 11/03/20 0934    Visit Number 5    Date for PT Re-Evaluation 12/16/20    Authorization Type Medicare    Authorization - Visit Number 5    Progress Note Due on Visit 10    PT Start Time 6415    PT Stop Time 1015    PT Time Calculation (min) 41 min    Activity Tolerance Patient tolerated treatment well    Behavior During Therapy [facility] for tasks assessed/performed           Past Medical History:  Diagnosis Date  . Diabetes mellitus 2007  . Diverticulitis   . Hyperlipidemia   . Obesity     Past Surgical History:  Procedure Laterality Date  . ABDOMINAL HYSTERECTOMY  2008   fibroids  . BIOPSY BREAST  2007   benign calcification  . 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 BREAST BIOPSY Left    needle core biopsy, benign  . Capsulotomy MPJ Right 12/31/2016   RT FOOT, 3rd MPJ  . FOOT SURGERY  2004   hammer toe  . Hammer Toe Repair Right 12/31/2016   RT #3, #5 toes    There were no vitals filed for this visit.   Subjective Assessment - 11/03/20 0938    Subjective Feeling looser. It's about a 4/10 today.    Pertinent History DM, low back pain, OA, HTN, Rt foot surgery    Patient Stated Goals Get limber and build up stamina    Currently in Pain? Yes    Pain Score 4     Pain Location Back    Pain Orientation Lower    Pain Descriptors / Indicators Tightness;Dull    Pain Type Chronic pain                             [facility] Adult PT Treatment/Exercise - 11/03/20 0001      Lumbar Exercises: Stretches   Hip Flexor Stretch Right;Left;2 reps;20 seconds    Hip Flexor Stretch Limitations  seated      Lumbar Exercises: Aerobic   Nustep L4: legs only x 5 min      Lumbar Exercises: Standing   Other Standing Lumbar Exercises Hip ABD x 10 bil, 2x10 on left with cues to keep foot straight      Lumbar Exercises: Seated   Sit to Stand 5 reps   no UE support, core engaged.      Lumbar Exercises: Supine   Bent Knee Raise 5 reps;3 seconds   each leg with core set   Bent Knee Raise Limitations then alternating up/up/down/down x 5 ea side    Bridge 10 reps;3 seconds   with red band ABD   Bridge Limitations with red band for isometric ABD      Lumbar Exercises: Sidelying   Clam Right;Left;10 reps    Clam Limitations red band      Knee/Hip Exercises: Standing   Lateral Step Up Both;10 reps    Forward Step Up Both;1 set;10 reps;Step Height: 6";Hand Hold: 2  PT Education - 11/03/20 1256    Education Details HEP progressed    Person(s) Educated Patient    Methods Explanation;Demonstration;Handout    Comprehension Verbalized understanding;Returned demonstration            PT Short Term Goals - 11/03/20 1257      PT SHORT TERM GOAL #1   Title Ind with initial HEP    Status Partially Met             PT Long Term Goals - 10/31/20 0909      PT LONG TERM GOAL #1   Title I with HEP    Time 8    Period Weeks    Status On-going      PT LONG TERM GOAL #2   Title report overall pain decrease =/> 75% with daily activity    Time 8    Period Weeks    Status On-going      PT LONG TERM GOAL #3   Title increase strength Rt hip =/> 5-/5 to support body with standing and transitional moevments    Time 8    Period Weeks    Status On-going      PT LONG TERM GOAL #4   Title improve FOTO =/< 38% limited    Time 8    Period Weeks    Status On-going      PT LONG TERM GOAL #5   Title Patient able to climb stairs with a reciprocal gait pattern without difficulty.    Time 8    Period Weeks    Status On-going                 Plan -  11/03/20 1021    Clinical Impression Statement Patient continues to report feeling looser. She tolerated TE well. She has definite functional weakness with step ups and standing hip ABD. Seated hip flexor stretch added to HEP.    Comorbidities DM, low back pain, OA, HTN, Rt foot surgery    Examination-Activity Limitations Stairs    PT Treatment/Interventions ADLs/Self Care Home Management;Aquatic Therapy;Cryotherapy;Electrical Stimulation;Moist Heat;Therapeutic exercise;Therapeutic activities;Neuromuscular re-education;Manual techniques;Dry needling;Patient/family education;Taping    PT Next Visit Plan continue BLE strength and core/lumbar stab. Progress HEP as tolerated.    PT [place]           Patient will benefit from skilled therapeutic intervention in order to improve the following deficits and impairments:  Pain,Impaired flexibility,Decreased strength,Increased muscle spasms  Visit Diagnosis: Chronic midline low back pain without sciatica     Problem List Patient Active Problem List   Diagnosis Date Noted  . Hearing loss of left ear 10/06/2020  . Encounter for chronic pain management 01/30/2020  . CKD stage G3a/A1, GFR 45-59 and albumin creatinine ratio <30 mg/g ([place]) 10/02/2019  . Left foot pain 01/10/2015  . Severe obesity (BMI >= 40) ([place]) 08/28/2014  . Cataract 08/12/2014  . Hypothyroidism 05/28/2014  . Lumbar spondylosis 02/26/2014  . HLD (hyperlipidemia) 02/01/2014  . Essential hypertension 09/20/2011  . Osteoarthritis of both knees 09/20/2011  . MORBID OBESITY 04/22/2010  . POSTMENOPAUSAL STATUS 04/22/2010  . TROCHANTERIC BURSITIS, RIGHT 12/03/2009  . Goiter 04/01/2009  . Diabetes mellitus ([place]) 04/01/2009    Madelyn Flavors PT 11/03/2020, 1:03 PM  [facility] [place], [place], [address] Phone: [phone]   Fax:  [phone]  Name: Regina Houston MRN: [id number] Date  of Birth: 06/05/1951

## 2020-11-05 ENCOUNTER — Encounter: Payer: Self-pay | Admitting: Physical Therapy

## 2020-11-05 ENCOUNTER — Ambulatory Visit (INDEPENDENT_AMBULATORY_CARE_PROVIDER_SITE_OTHER): Payer: Medicare Other

## 2020-11-05 ENCOUNTER — Other Ambulatory Visit: Payer: Self-pay

## 2020-11-05 ENCOUNTER — Ambulatory Visit (INDEPENDENT_AMBULATORY_CARE_PROVIDER_SITE_OTHER): Payer: Medicare Other | Admitting: Physical Therapy

## 2020-11-05 DIAGNOSIS — Z1231 Encounter for screening mammogram for malignant neoplasm of breast: Secondary | ICD-10-CM | POA: Diagnosis not present

## 2020-11-05 DIAGNOSIS — G8929 Other chronic pain: Secondary | ICD-10-CM | POA: Diagnosis not present

## 2020-11-05 DIAGNOSIS — M545 Low back pain, unspecified: Secondary | ICD-10-CM | POA: Diagnosis not present

## 2020-11-05 NOTE — Therapy (Signed)
[place], [place], [address] Phone: [phone]   Fax:  [phone]  Physical Therapy Treatment  Patient Details  Name: Regina Houston MRN: [id number] Date of Birth: [birth year] Referring Provider (PT): [name] MD   Encounter Date: 11/05/2020   PT End of Session - 11/05/20 1437    Visit Number 6    Date for PT Re-Evaluation 12/16/20    Authorization Type Medicare    Authorization - Visit Number 6    Progress Note Due on Visit 10    PT Start Time 1224    PT Stop Time 1514    PT Time Calculation (min) 40 min    Activity Tolerance Patient tolerated treatment well    Behavior During Therapy [facility] for tasks assessed/performed           Past Medical History:  Diagnosis Date  . Diabetes mellitus 2007  . Diverticulitis   . Hyperlipidemia   . Obesity     Past Surgical History:  Procedure Laterality Date  . ABDOMINAL HYSTERECTOMY  2008   fibroids  . BIOPSY BREAST  2007   benign calcification  . 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 BREAST BIOPSY Left    needle core biopsy, benign  . Capsulotomy MPJ Right 12/31/2016   RT FOOT, 3rd MPJ  . FOOT SURGERY  2004   hammer toe  . Hammer Toe Repair Right 12/31/2016   RT #3, #5 toes    There were no vitals filed for this visit.   Subjective Assessment - 11/05/20 1437    Subjective "I'm stiff as a board today".  She reports she did a lot of things around the house yesterday (ie: flipping the mattress and mopping with (7#) dog occasionally holding onto mop), as well as some exercises.  She reports she was able to lift her LE over the tub a little easier today.    Patient Stated Goals Get limber and build up stamina    Currently in Pain? Yes    Pain Score 5     Pain Location Back    Pain Orientation Right;Lower    Pain Descriptors / Indicators Dull    Aggravating Factors  prolonged standing    Pain Relieving Factors  recliner, TENS              [facility] PT Assessment - 11/05/20 0001      Assessment   Medical Diagnosis chronic LBP without sciatica    Referring Provider (PT) [name] MD    Onset Date/Surgical Date 11/23/19    Hand Dominance Right    Next MD Visit March    Prior Therapy yes            [facility] Adult PT Treatment/Exercise - 11/05/20 0001      Lumbar Exercises: Stretches   Hip Flexor Stretch Right;Left;2 reps;20 seconds    Hip Flexor Stretch Limitations seated    Standing Extension 2 reps;5 seconds    Piriformis Stretch Right;Left;1 rep;30 seconds   supine with strap assist behind knee   Piriformis Stretch Limitations 2nd rep in modified pigeon pose x 30 sec x 1 rep each side.      Lumbar Exercises: Aerobic   Recumbent Bike L1: 5 min      Lumbar Exercises: Supine   Bent Knee Raise 10 reps;3 seconds   with core engaged.   Bridge 10 reps;3 seconds    Bridge Limitations with red band for  isometric ABD      Lumbar Exercises: Sidelying   Clam Right;Left;10 reps    Clam Limitations red band at thigh     Knee/Hip Exercises: Standing   Lateral Step Up Right;1 set;10 reps;Hand Hold: 2;Step Height: 4"    Forward Step Up Right;5 reps;Step Height: 6";10 reps;Step Height: 4"            PT Short Term Goals - 11/03/20 1257      PT SHORT TERM GOAL #1   Title Ind with initial HEP    Status Partially Met             PT Long Term Goals - 10/31/20 0909      PT LONG TERM GOAL #1   Title I with HEP    Time 8    Period Weeks    Status On-going      PT LONG TERM GOAL #2   Title report overall pain decrease =/> 75% with daily activity    Time 8    Period Weeks    Status On-going      PT LONG TERM GOAL #3   Title increase strength Rt hip =/> 5-/5 to support body with standing and transitional moevments    Time 8    Period Weeks    Status On-going      PT LONG TERM GOAL #4   Title improve FOTO =/< 38% limited    Time 8    Period Weeks    Status On-going       PT LONG TERM GOAL #5   Title Patient able to climb stairs with a reciprocal gait pattern without difficulty.    Time 8    Period Weeks    Status On-going                 Plan - 11/05/20 1508    Clinical Impression Statement Pt reports decrease in R LBP with supine and seated exercises. Pt reported increase in R LBP (at [facility] joint) with ascending 6" step; able to complete 4" step without difficulty or pain.  Unable to ascend 6" step with RLE laterally due to increase in pain.  Pt reporting small improvements in functional mobility.   Comorbidities DM, low back pain, OA, HTN, Rt foot surgery    Examination-Activity Limitations Stairs    Rehab Potential Excellent    PT Frequency 2x / week    PT Duration 8 weeks    PT Treatment/Interventions ADLs/Self Care Home Management;Aquatic Therapy;Cryotherapy;Electrical Stimulation;Moist Heat;Therapeutic exercise;Therapeutic activities;Neuromuscular re-education;Manual techniques;Dry needling;Patient/family education;Taping    PT Next Visit Plan continue BLE strength and core/lumbar stab. Progress HEP as tolerated.    PT [place]           Patient will benefit from skilled therapeutic intervention in order to improve the following deficits and impairments:  Pain,Impaired flexibility,Decreased strength,Increased muscle spasms  Visit Diagnosis: Chronic midline low back pain without sciatica     Problem List Patient Active Problem List   Diagnosis Date Noted  . Hearing loss of left ear 10/06/2020  . Encounter for chronic pain management 01/30/2020  . CKD stage G3a/A1, GFR 45-59 and albumin creatinine ratio <30 mg/g ([place]) 10/02/2019  . Left foot pain 01/10/2015  . Severe obesity (BMI >= 40) ([place]) 08/28/2014  . Cataract 08/12/2014  . Hypothyroidism 05/28/2014  . Lumbar spondylosis 02/26/2014  . HLD (hyperlipidemia) 02/01/2014  . Essential hypertension 09/20/2011  . Osteoarthritis of both knees 09/20/2011  . MORBID  OBESITY 04/22/2010  . POSTMENOPAUSAL STATUS 04/22/2010  . TROCHANTERIC BURSITIS, RIGHT 12/03/2009  . Goiter 04/01/2009  . Diabetes mellitus ([place]) 04/01/2009   Kerin Perna, PTA 11/05/20 4:34 PM  [place], [place], [address] Phone: [phone]   Fax:  [phone]  Name: Regina Houston MRN: [id number] Date of Birth: [birth year]

## 2020-11-06 ENCOUNTER — Encounter: Payer: Medicare Other | Admitting: Physical Therapy

## 2020-11-11 ENCOUNTER — Other Ambulatory Visit: Payer: Self-pay | Admitting: Family Medicine

## 2020-11-11 DIAGNOSIS — M47816 Spondylosis without myelopathy or radiculopathy, lumbar region: Secondary | ICD-10-CM

## 2020-11-17 ENCOUNTER — Other Ambulatory Visit: Payer: Self-pay

## 2020-11-17 ENCOUNTER — Ambulatory Visit (INDEPENDENT_AMBULATORY_CARE_PROVIDER_SITE_OTHER): Payer: Medicare Other | Admitting: Physical Therapy

## 2020-11-17 ENCOUNTER — Encounter: Payer: Self-pay | Admitting: Physical Therapy

## 2020-11-17 DIAGNOSIS — M545 Low back pain, unspecified: Secondary | ICD-10-CM

## 2020-11-17 DIAGNOSIS — G8929 Other chronic pain: Secondary | ICD-10-CM

## 2020-11-17 NOTE — Therapy (Signed)
[place], [place], [address] Phone: [phone]   Fax:  [phone]  Physical Therapy Treatment  Patient Details  Name: Regina Houston MRN: [id number] Date of Birth: 1951-09-10 Referring Provider (PT): [name] MD   Encounter Date: 11/17/2020   PT End of Session - 11/17/20 1005    Visit Number 7    Date for PT Re-Evaluation 12/16/20    Authorization Type Medicare    Authorization - Visit Number 7    Progress Note Due on Visit 10    PT Start Time 0930    PT Stop Time 1010    PT Time Calculation (min) 40 min    Activity Tolerance Patient tolerated treatment well    Behavior During Therapy [facility] for tasks assessed/performed           Past Medical History:  Diagnosis Date  . Diabetes mellitus 2007  . Diverticulitis   . Hyperlipidemia   . Obesity     Past Surgical History:  Procedure Laterality Date  . ABDOMINAL HYSTERECTOMY  2008   fibroids  . BIOPSY BREAST  2007   benign calcification  . 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 BREAST BIOPSY Left    needle core biopsy, benign  . Capsulotomy MPJ Right 12/31/2016   RT FOOT, 3rd MPJ  . FOOT SURGERY  2004   hammer toe  . Hammer Toe Repair Right 12/31/2016   RT #3, #5 toes    There were no vitals filed for this visit.   Subjective Assessment - 11/17/20 0930    Subjective I feel "fair" today. Stretching and walking help reduce stiffness    Pertinent History DM, low back pain, OA, HTN, Rt foot surgery    Patient Stated Goals Get limber and build up stamina    Currently in Pain? Yes    Pain Score 3     Pain Location Back    Pain Orientation Right;Lower    Pain Descriptors / Indicators Aching    Pain Onset More than a month ago                             [facility] Adult PT Treatment/Exercise - 11/17/20 0001      Lumbar Exercises: Stretches   Hip Flexor Stretch Right;Left;2 reps;30 seconds     Hip Flexor Stretch Limitations seated    Standing Extension 3 reps;5 seconds    Piriformis Stretch Right;Left;2 reps;30 seconds      Lumbar Exercises: Aerobic   Nustep L4 legs only x 5 mins      Lumbar Exercises: Standing   Other Standing Lumbar Exercises hip abd and ext both x 10 bilat      Lumbar Exercises: Supine   Bent Knee Raise 10 reps;3 seconds   tactile cues for core activation   Bridge 10 reps;3 seconds    Bridge Limitations red TB      Lumbar Exercises: Sidelying   Clam Both;10 reps    Clam Limitations red TB      Knee/Hip Exercises: Standing   Lateral Step Up Right;2 sets;10 reps;Hand Hold: 2;Step Height: 4"    Forward Step Up 2 sets;Right;10 reps;Hand Hold: 2;Step Height: 4"                    PT Short Term Goals - 11/03/20 1257      PT SHORT TERM GOAL #1  Title Ind with initial HEP    Status Partially Met             PT Long Term Goals - 10/31/20 0909      PT LONG TERM GOAL #1   Title I with HEP    Time 8    Period Weeks    Status On-going      PT LONG TERM GOAL #2   Title report overall pain decrease =/> 75% with daily activity    Time 8    Period Weeks    Status On-going      PT LONG TERM GOAL #3   Title increase strength Rt hip =/> 5-/5 to support body with standing and transitional moevments    Time 8    Period Weeks    Status On-going      PT LONG TERM GOAL #4   Title improve FOTO =/< 38% limited    Time 8    Period Weeks    Status On-going      PT LONG TERM GOAL #5   Title Patient able to climb stairs with a reciprocal gait pattern without difficulty.    Time 8    Period Weeks    Status On-going                 Plan - 11/17/20 1006    Clinical Impression Statement Pt reports she is feeling better after stretching and "moving around" this morning. Good tolerance to standing lumbar exercises.    Personal Factors and Comorbidities Comorbidity 3+    Comorbidities DM, low back pain, OA, HTN, Rt foot surgery     Examination-Activity Limitations Stairs    Rehab Potential Excellent    PT Frequency 1x / week    PT Duration 8 weeks    PT Treatment/Interventions ADLs/Self Care Home Management;Aquatic Therapy;Cryotherapy;Electrical Stimulation;Moist Heat;Therapeutic exercise;Therapeutic activities;Neuromuscular re-education;Manual techniques;Dry needling;Patient/family education;Taping    PT Next Visit Plan progress core strength    PT Home Exercise Plan [email or web address]    Consulted and Agree with Plan of Care Patient           Patient will benefit from skilled therapeutic intervention in order to improve the following deficits and impairments:  Pain,Impaired flexibility,Decreased strength,Increased muscle spasms  Visit Diagnosis: Chronic midline low back pain without sciatica     Problem List Patient Active Problem List   Diagnosis Date Noted  . Hearing loss of left ear 10/06/2020  . Encounter for chronic pain management 01/30/2020  . CKD stage G3a/A1, GFR 45-59 and albumin creatinine ratio <30 mg/g ([place]) 10/02/2019  . Left foot pain 01/10/2015  . Severe obesity (BMI >= 40) ([place]) 08/28/2014  . Cataract 08/12/2014  . Hypothyroidism 05/28/2014  . Lumbar spondylosis 02/26/2014  . HLD (hyperlipidemia) 02/01/2014  . Essential hypertension 09/20/2011  . Osteoarthritis of both knees 09/20/2011  . MORBID OBESITY 04/22/2010  . POSTMENOPAUSAL STATUS 04/22/2010  . TROCHANTERIC BURSITIS, RIGHT 12/03/2009  . Goiter 04/01/2009  . Diabetes mellitus ([place]) 04/01/2009   Davon Abdelaziz, PT  [place] 11/17/2020, 10:08 AM  [facility] [place], [place], [address] Phone: [phone]   Fax:  [phone]  Name: Regina Houston MRN: [id number] Date of Birth: August 03, 1951

## 2020-11-20 ENCOUNTER — Encounter: Payer: Medicare Other | Admitting: Physical Therapy

## 2020-11-24 ENCOUNTER — Other Ambulatory Visit: Payer: Self-pay

## 2020-11-24 ENCOUNTER — Encounter: Payer: Self-pay | Admitting: Rehabilitative and Restorative Service Providers"

## 2020-11-24 ENCOUNTER — Ambulatory Visit (INDEPENDENT_AMBULATORY_CARE_PROVIDER_SITE_OTHER): Payer: Medicare Other | Admitting: Rehabilitative and Restorative Service Providers"

## 2020-11-24 DIAGNOSIS — M545 Low back pain, unspecified: Secondary | ICD-10-CM | POA: Diagnosis not present

## 2020-11-24 DIAGNOSIS — G8929 Other chronic pain: Secondary | ICD-10-CM | POA: Diagnosis not present

## 2020-11-24 NOTE — Therapy (Signed)
[place], [place], [address] Phone: [phone]   Fax:  [phone]  Physical Therapy Treatment  Patient Details  Name: Regina Houston MRN: [id number] Date of Birth: 1951/07/10 Referring Provider (PT): [name] MD   Encounter Date: 11/24/2020   PT End of Session - 11/24/20 1012    Visit Number 8    Date for PT Re-Evaluation 12/16/20    Authorization Type Medicare    Authorization - Visit Number 8    Progress Note Due on Visit 10    PT Start Time 0933    PT Stop Time 1015    PT Time Calculation (min) 42 min    Activity Tolerance Patient tolerated treatment well    Behavior During Therapy [facility] for tasks assessed/performed           Past Medical History:  Diagnosis Date  . Diabetes mellitus 2007  . Diverticulitis   . Hyperlipidemia   . Obesity     Past Surgical History:  Procedure Laterality Date  . ABDOMINAL HYSTERECTOMY  2008   fibroids  . BIOPSY BREAST  2007   benign calcification  . 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 BREAST BIOPSY Left    needle core biopsy, benign  . Capsulotomy MPJ Right 12/31/2016   RT FOOT, 3rd MPJ  . FOOT SURGERY  2004   hammer toe  . Hammer Toe Repair Right 12/31/2016   RT #3, #5 toes    There were no vitals filed for this visit.   Subjective Assessment - 11/24/20 0933    Subjective The patient reports new onset of radiating pain down the L leg to the ankle.  She notes 3/10 pain today.    Pertinent History DM, low back pain, OA, HTN, Rt foot surgery    Patient Stated Goals Get limber and build up stamina    Currently in Pain? Yes    Pain Score 3     Pain Location Back    Pain Orientation Lower    Pain Descriptors / Indicators Aching;Radiating    Pain Type Chronic pain    Pain Radiating Towards radiates into left leg    Pain Onset More than a month ago    Pain Frequency Constant    Aggravating Factors  prolonged standing     Pain Relieving Factors reclinerLenoard Aden              [facility] PT Assessment - 11/24/20 9211      Assessment   Medical Diagnosis chronic LBP without sciatica    Referring Provider (PT) [name] MD    Onset Date/Surgical Date 11/23/19    Hand Dominance Right    Next MD Visit March    Prior Therapy yes                         [facility] Adult PT Treatment/Exercise - 11/24/20 0938      Exercises   Exercises Lumbar      Lumbar Exercises: Stretches   Lower Trunk Rotation 30 seconds;2 reps    Press Ups 10 reps    Press Ups Limitations to tolerance-- reduces pain    Piriformis Stretch Right;Left;1 rep;60 seconds      Lumbar Exercises: Standing   Heel Raises 15 reps    Wall Slides 10 reps    Wall Slides Limitations painful in bilat knees    Row Strengthening;Both;15 reps  Theraband Level (Row) Level 2 (Red)    Row Limitations low row    Other Standing Lumbar Exercises wall plank moving UEs into flexion alternating UEs    Other Standing Lumbar Exercises single leg stance R and L sides x 15 seconds x 2 reps each side; marching moving forward 10 feet x 5 reps, backwards walking 10 feet x 5 reps      Lumbar Exercises: Supine   Bent Knee Raise 10 reps;3 seconds    Bridge 10 reps;3 seconds    Other Supine Lumbar Exercises gentle rocking with legs on physioball      Knee/Hip Exercises: Standing   Lateral Step Up Right;Left;10 reps    Lateral Step Up Limitations onto 1" compliant foam    Forward Step Up Right;Left;1 set;10 reps    Forward Step Up Limitations 4" step    SLS near support surface    Other Standing Knee Exercises foam standing with head turns working on core engagement and balance.                    PT Short Term Goals - 11/03/20 1257      PT SHORT TERM GOAL #1   Title Ind with initial HEP    Status Partially Met             PT Long Term Goals - 10/31/20 0909      PT LONG TERM GOAL #1   Title I with HEP    Time 8     Period Weeks    Status On-going      PT LONG TERM GOAL #2   Title report overall pain decrease =/> 75% with daily activity    Time 8    Period Weeks    Status On-going      PT LONG TERM GOAL #3   Title increase strength Rt hip =/> 5-/5 to support body with standing and transitional moevments    Time 8    Period Weeks    Status On-going      PT LONG TERM GOAL #4   Title improve FOTO =/< 38% limited    Time 8    Period Weeks    Status On-going      PT LONG TERM GOAL #5   Title Patient able to climb stairs with a reciprocal gait pattern without difficulty.    Time 8    Period Weeks    Status On-going                 Plan - 11/24/20 1014    Clinical Impression Statement The patient reports she has L LE pain and radiating symptoms today.  She tolerated exercises well today.  We will monitor L LE pain and modify plan as needed. She tolerated prone press ups well.   The patient has had a change in activity and movement getting a puppy 2 weeks ago and staying busy with caring for 10lb puppy (bending to lift, etc).    PT Treatment/Interventions ADLs/Self Care Home Management;Aquatic Therapy;Cryotherapy;Electrical Stimulation;Moist Heat;Therapeutic exercise;Therapeutic activities;Neuromuscular re-education;Manual techniques;Dry needling;Patient/family education;Taping    PT Next Visit Plan progress core strength; work on mechanics for lifting the puppy and bending to reach the floor; continue LE strengthening.    PT Home Exercise Plan [email or web address]    Consulted and Agree with Plan of Care Patient           Patient will benefit from skilled therapeutic intervention in order to improve the following  deficits and impairments:     Visit Diagnosis: Chronic midline low back pain without sciatica     Problem List Patient Active Problem List   Diagnosis Date Noted  . Hearing loss of left ear 10/06/2020  . Encounter for chronic pain management 01/30/2020  . CKD stage G3a/A1, GFR  45-59 and albumin creatinine ratio <30 mg/g ([place]) 10/02/2019  . Left foot pain 01/10/2015  . Severe obesity (BMI >= 40) ([place]) 08/28/2014  . Cataract 08/12/2014  . Hypothyroidism 05/28/2014  . Lumbar spondylosis 02/26/2014  . HLD (hyperlipidemia) 02/01/2014  . Essential hypertension 09/20/2011  . Osteoarthritis of both knees 09/20/2011  . MORBID OBESITY 04/22/2010  . POSTMENOPAUSAL STATUS 04/22/2010  . TROCHANTERIC BURSITIS, RIGHT 12/03/2009  . Goiter 04/01/2009  . Diabetes mellitus ([place]) 04/01/2009    Simpson, PT 11/24/2020, 1:12 PM  [facility] [place], [place], [address] Phone: [phone]   Fax:  [phone]  Name: Kelci Petrella MRN: [id number] Date of Birth: 1951/07/22

## 2020-11-27 ENCOUNTER — Encounter: Payer: Self-pay | Admitting: Physical Therapy

## 2020-11-27 ENCOUNTER — Other Ambulatory Visit: Payer: Self-pay

## 2020-11-27 ENCOUNTER — Ambulatory Visit (INDEPENDENT_AMBULATORY_CARE_PROVIDER_SITE_OTHER): Payer: Medicare Other | Admitting: Physical Therapy

## 2020-11-27 DIAGNOSIS — G8929 Other chronic pain: Secondary | ICD-10-CM | POA: Diagnosis not present

## 2020-11-27 DIAGNOSIS — M545 Low back pain, unspecified: Secondary | ICD-10-CM

## 2020-11-27 NOTE — Therapy (Signed)
[place], [place], [address] Phone: [phone]   Fax:  [phone]  Physical Therapy Treatment  Patient Details  Name: Regina Houston MRN: [id number] Date of Birth: March 13, 1951 Referring Provider (PT): [name] MD   Encounter Date: 11/27/2020   PT End of Session - 11/27/20 0845    Visit Number 9    Date for PT Re-Evaluation 12/16/20    Authorization Type Medicare    Authorization - Visit Number 9    Progress Note Due on Visit 10    PT Start Time 0845    PT Stop Time 0929    PT Time Calculation (min) 44 min    Activity Tolerance Patient tolerated treatment well    Behavior During Therapy [facility] for tasks assessed/performed           Past Medical History:  Diagnosis Date  . Diabetes mellitus 2007  . Diverticulitis   . Hyperlipidemia   . Obesity     Past Surgical History:  Procedure Laterality Date  . ABDOMINAL HYSTERECTOMY  2008   fibroids  . BIOPSY BREAST  2007   benign calcification  . 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 BREAST BIOPSY Left    needle core biopsy, benign  . Capsulotomy MPJ Right 12/31/2016   RT FOOT, 3rd MPJ  . FOOT SURGERY  2004   hammer toe  . Hammer Toe Repair Right 12/31/2016   RT #3, #5 toes    There were no vitals filed for this visit.   Subjective Assessment - 11/27/20 0845    Subjective I thought it was better but today it's back a bit.    Pertinent History DM, low back pain, OA, HTN, Rt foot surgery    Patient Stated Goals Get limber and build up stamina    Currently in Pain? Yes    Pain Score 5     Pain Location Back    Pain Orientation Lower    Pain Descriptors / Indicators Aching                             [facility] Adult PT Treatment/Exercise - 11/27/20 0001      Lumbar Exercises: Standing   Heel Raises 15 reps    Wall Slides 10 reps    Wall Slides Limitations no pain    Row Strengthening;Both;15  reps    Theraband Level (Row) Level 2 (Red)    Other Standing Lumbar Exercises wall plank moving UEs into flexion alternating UEs; then with yellow band resistance x 10 ea side    Other Standing Lumbar Exercises single leg stance R and L sides x 15 seconds x 2 reps each side; 1st set with one hand and 2nd set with 2-finger touch; marching moving forward 20 feet x 2reps with blue band resistance, backwards walking 20 feet x 2 reps; one rep with blue band      Lumbar Exercises: Seated   Sit to Stand 5 reps   to foam on mat   Other Seated Lumbar Exercises back extension 10# KB x 10 then with blue band around feet x 10      Lumbar Exercises: Supine   Bent Knee Raise 20 reps    Bent Knee Raise Limitations with blue band and TCs to maintain core contraction    Bridge 10 reps;3 seconds    Bridge Limitations blue  Lumbar Exercises: Sidelying   Clam Both;10 reps    Clam Limitations blue      Knee/Hip Exercises: Standing   Lateral Step Up Right;Left;10 reps    Lateral Step Up Limitations onto 3" compliant foam                    PT Short Term Goals - 11/27/20 0926      PT SHORT TERM GOAL #1   Title Ind with initial HEP    Status Achieved             PT Long Term Goals - 11/27/20 9892      PT LONG TERM GOAL #1   Title I with HEP    Status On-going      PT LONG TERM GOAL #2   Title report overall pain decrease =/> 75% with daily activity    Baseline 10-15% improvement    Status On-going      PT LONG TERM GOAL #3   Title increase strength Rt hip =/> 5-/5 to support body with standing and transitional moevments    Baseline sit to stand and moving in bed has improved    Status Partially Met                 Plan - 11/27/20 0936    Clinical Impression Statement Patient reporting some increased pain today but she did very well with strengthening. She was able to progress resistance and tolerated wall slides without pain today. We worked on seated lifting to  simulate her lifting her puppy and sit to stands to strengthen quads for functional lifts from standing. Patient reporting 10-15% improvement in pain overall.    Personal Factors and Comorbidities Comorbidity 3+    Comorbidities DM, low back pain, OA, HTN, Rt foot surgery    Examination-Activity Limitations Stairs    PT Frequency 1x / week    PT Duration 8 weeks    PT Treatment/Interventions ADLs/Self Care Home Management;Aquatic Therapy;Cryotherapy;Electrical Stimulation;Moist Heat;Therapeutic exercise;Therapeutic activities;Neuromuscular re-education;Manual techniques;Dry needling;Patient/family education;Taping    PT Next Visit Plan 10th visit progress note; FOTO; assess stairs in bldg and hip strengthcontinue core and mechanics for lifting the puppy and bending to reach the floor; continue LE strengthening.    PT Home Exercise Plan [email or web address]    Consulted and Agree with Plan of Care Patient           Patient will benefit from skilled therapeutic intervention in order to improve the following deficits and impairments:  Pain,Impaired flexibility,Decreased strength,Increased muscle spasms  Visit Diagnosis: Chronic midline low back pain without sciatica     Problem List Patient Active Problem List   Diagnosis Date Noted  . Hearing loss of left ear 10/06/2020  . Encounter for chronic pain management 01/30/2020  . CKD stage G3a/A1, GFR 45-59 and albumin creatinine ratio <30 mg/g ([place]) 10/02/2019  . Left foot pain 01/10/2015  . Severe obesity (BMI >= 40) ([place]) 08/28/2014  . Cataract 08/12/2014  . Hypothyroidism 05/28/2014  . Lumbar spondylosis 02/26/2014  . HLD (hyperlipidemia) 02/01/2014  . Essential hypertension 09/20/2011  . Osteoarthritis of both knees 09/20/2011  . MORBID OBESITY 04/22/2010  . POSTMENOPAUSAL STATUS 04/22/2010  . TROCHANTERIC BURSITIS, RIGHT 12/03/2009  . Goiter 04/01/2009  . Diabetes mellitus ([place]) 04/01/2009    Madelyn Flavors PT 11/27/2020, 9:41 AM  [facility] [place], [place], [address] Phone: [phone]   Fax:  [phone]  Name: Regina Houston  MRN: [id number] Date of Birth: [birth year]

## 2020-12-02 ENCOUNTER — Other Ambulatory Visit: Payer: Self-pay

## 2020-12-02 ENCOUNTER — Ambulatory Visit (INDEPENDENT_AMBULATORY_CARE_PROVIDER_SITE_OTHER): Payer: Medicare Other | Admitting: Physical Therapy

## 2020-12-02 DIAGNOSIS — G8929 Other chronic pain: Secondary | ICD-10-CM

## 2020-12-02 DIAGNOSIS — M545 Low back pain, unspecified: Secondary | ICD-10-CM

## 2020-12-02 NOTE — Therapy (Addendum)
[place], [place], [address] Phone: [phone]   Fax:  [phone]  Physical Therapy Treatment and 10th Visit Progress Note  Patient Details  Name: Regina Houston MRN: [id number] Date of Birth: [birth year] Referring Provider (PT): [name] MD   Encounter Date: 12/02/2020   Physical Therapy Progress Note  Dates of Reporting Period: 11/30 to 12/02/20     PT End of Session - 12/02/20 1014    Visit Number 10    Date for PT Re-Evaluation 12/16/20    Authorization Type Medicare    Authorization - Visit Number 10   Progress Note Due on Visit 10    PT Start Time 0935    PT Stop Time 1014    PT Time Calculation (min) 39 min    Activity Tolerance Patient tolerated treatment well    Behavior During Therapy [facility] for tasks assessed/performed           Past Medical History:  Diagnosis Date  . Diabetes mellitus 2007  . Diverticulitis   . Hyperlipidemia   . Obesity     Past Surgical History:  Procedure Laterality Date  . ABDOMINAL HYSTERECTOMY  2008   fibroids  . BIOPSY BREAST  2007   benign calcification  . 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 BREAST BIOPSY Left    needle core biopsy, benign  . Capsulotomy MPJ Right 12/31/2016   RT FOOT, 3rd MPJ  . FOOT SURGERY  2004   hammer toe  . Hammer Toe Repair Right 12/31/2016   RT #3, #5 toes    There were no vitals filed for this visit.   Subjective Assessment - 12/02/20 0943    Subjective "I was really sore the after my last session".  Pt reports stairs are easier but Rt knee bothers her while descending.    Pertinent History DM, low back pain, OA, HTN, Rt foot surgery    Patient Stated Goals Get limber and build up stamina    Currently in Pain? Yes    Pain Score 3     Pain Location Back    Pain Orientation Lower;Right    Pain Descriptors / Indicators Sore    Aggravating Factors  prolonged standing    Pain  Relieving Factors TENS.              [facility] PT Assessment - 12/02/20 0001      Assessment   Medical Diagnosis chronic LBP without sciatica    Referring Provider (PT) [name] MD    Onset Date/Surgical Date 11/23/19    Hand Dominance Right    Next MD Visit PRN    Prior Therapy yes      Observation/Other Assessments   Focus on Therapeutic Outcomes (FOTO)  48% limited.      Strength   Right Hip Flexion 5/5    Right Hip Extension 4+/5    Right Hip ABduction 4+/5    Left Hip Extension 5/5    Left Hip ABduction 4+/5             [facility] Adult PT Treatment/Exercise - 12/02/20 0001      Self-Care   Other Self-Care Comments  Trial of ball release to Rt ant hip.  Self massage with ball to glute med/ piriformis.      Lumbar Exercises: Stretches   Lower Trunk Rotation 60 seconds   rocking knees back and forth.   Prone on Elbows Stretch 1 rep;30  seconds      Lumbar Exercises: Aerobic   Other Aerobic Exercise Walking down long hall way to/from stairs.      Lumbar Exercises: Seated   Sit to Stand 5 reps   to foam on mat   Other Seated Lumbar Exercises back extension 10# KB x 10 then with blue band around feet x 10      Lumbar Exercises: Supine   Bridge 10 reps;3 seconds      Lumbar Exercises: Prone   Other Prone Lumbar Exercises POE with unilateral forward arm reaches x 5 each.      Knee/Hip Exercises: Standing   Stairs 13 steps with unilateral rail and reciprocal pattern down/ up; tolerated well.      Manual Therapy   Manual Therapy Soft tissue mobilization    Manual therapy comments Pt prone    Soft tissue mobilization Rt glute med/max, piriformis with deep pressure, with/without passive ER of LE.                    PT Short Term Goals - 11/27/20 0926      PT SHORT TERM GOAL #1   Title Ind with initial HEP    Status Achieved             PT Long Term Goals - 12/02/20 7262      PT LONG TERM GOAL #1   Title I with HEP    Status On-going       PT LONG TERM GOAL #2   Title report overall pain decrease =/> 75% with daily activity    Baseline 10-15% improvement (12/02/20)    Status On-going      PT LONG TERM GOAL #3   Title increase strength Rt hip =/> 5-/5 to support body with standing and transitional movements    Baseline sit to stand and moving in bed has improved    Status Partially Met      PT LONG TERM GOAL #4   Title improve FOTO =/< 38% limited    Baseline 48% limited (12/02/20)    Time 8    Period Weeks    Status On-going      PT LONG TERM GOAL #5   Title Patient able to climb stairs with a reciprocal gait pattern without difficulty.    Time 8    Period Weeks    Status Achieved                 Plan - 12/02/20 1259    Clinical Impression Statement Pt reported some relief of low back with bridges and lower trunk rotation; further reduction with self massage to posterior hip (with cues).  Her hip strength has improved since eval and she is tolerating exercises and stairs better.  Pt has made gradual progress towards her partially met goals.  Pt will benefit from additional sessions to assist with meeting unmet goals.    Personal Factors and Comorbidities Comorbidity 3+    Comorbidities DM, low back pain, OA, HTN, Rt foot surgery    Examination-Activity Limitations Stairs    PT Frequency 2x / week    PT Duration 8 weeks    PT Treatment/Interventions ADLs/Self Care Home Management;Aquatic Therapy;Cryotherapy;Electrical Stimulation;Moist Heat;Therapeutic exercise;Therapeutic activities;Neuromuscular re-education;Manual techniques;Dry needling;Patient/family education;Taping    PT Next Visit Plan hip strengthcontinue core and mechanics for lifting the puppy and bending to reach the floor; continue LE strengthening.  Rt hip flexor stretch.    [place]  Consulted and Agree with Plan of Care Patient           Patient will benefit from skilled therapeutic intervention in order to  improve the following deficits and impairments:  Pain,Impaired flexibility,Decreased strength,Increased muscle spasms  Visit Diagnosis: Chronic midline low back pain without sciatica     Problem List Patient Active Problem List   Diagnosis Date Noted  . Hearing loss of left ear 10/06/2020  . Encounter for chronic pain management 01/30/2020  . CKD stage G3a/A1, GFR 45-59 and albumin creatinine ratio <30 mg/g ([place]) 10/02/2019  . Left foot pain 01/10/2015  . Severe obesity (BMI >= 40) ([place]) 08/28/2014  . Cataract 08/12/2014  . Hypothyroidism 05/28/2014  . Lumbar spondylosis 02/26/2014  . HLD (hyperlipidemia) 02/01/2014  . Essential hypertension 09/20/2011  . Osteoarthritis of both knees 09/20/2011  . MORBID OBESITY 04/22/2010  . POSTMENOPAUSAL STATUS 04/22/2010  . TROCHANTERIC BURSITIS, RIGHT 12/03/2009  . Goiter 04/01/2009  . Diabetes mellitus ([place]) 04/01/2009   Kerin Perna, PTA 12/02/20 1:06 PM  [place], [place], [address] Phone: [phone]   Fax:  [phone]  Name: Regina Houston MRN: [id number] Date of Birth: 05-27-1951  Madelyn Flavors, PT 12/02/20 1:34 PM  [facility] [place] [address] [place], [place] [address]  [phone] (office) [phone] (fax)

## 2020-12-04 ENCOUNTER — Other Ambulatory Visit: Payer: Self-pay

## 2020-12-04 ENCOUNTER — Ambulatory Visit (INDEPENDENT_AMBULATORY_CARE_PROVIDER_SITE_OTHER): Payer: Medicare Other | Admitting: Rehabilitative and Restorative Service Providers"

## 2020-12-04 ENCOUNTER — Encounter: Payer: Self-pay | Admitting: Rehabilitative and Restorative Service Providers"

## 2020-12-04 DIAGNOSIS — G8929 Other chronic pain: Secondary | ICD-10-CM

## 2020-12-04 DIAGNOSIS — M545 Low back pain, unspecified: Secondary | ICD-10-CM | POA: Diagnosis not present

## 2020-12-04 NOTE — Therapy (Signed)
[place], [place], [address] Phone: [phone]   Fax:  [phone]  Physical Therapy Treatment  Patient Details  Name: Regina Houston MRN: [id number] Date of Birth: [birth year] Referring Provider (PT): [name] MD   Encounter Date: 12/04/2020   PT End of Session - 12/04/20 0943    Visit Number 11    Date for PT Re-Evaluation 12/16/20    Authorization Type Medicare    Authorization - Visit Number 11    Progress Note Due on Visit 20    PT Start Time 0935    PT Stop Time 1015    PT Time Calculation (min) 40 min    Activity Tolerance Patient tolerated treatment well    Behavior During Therapy [facility] for tasks assessed/performed           Past Medical History:  Diagnosis Date  . Diabetes mellitus 2007  . Diverticulitis   . Hyperlipidemia   . Obesity     Past Surgical History:  Procedure Laterality Date  . ABDOMINAL HYSTERECTOMY  2008   fibroids  . BIOPSY BREAST  2007   benign calcification  . 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 BREAST BIOPSY Left    needle core biopsy, benign  . Capsulotomy MPJ Right 12/31/2016   RT FOOT, 3rd MPJ  . FOOT SURGERY  2004   hammer toe  . Hammer Toe Repair Right 12/31/2016   RT #3, #5 toes    There were no vitals filed for this visit.   Subjective Assessment - 12/04/20 0937    Subjective The patient reports she felt good after last session.  She noticed some L ankle discomfort yesterday after wearing a different pair of shoes.    Pertinent History DM, low back pain, OA, HTN, Rt foot surgery    Patient Stated Goals Get limber and build up stamina    Currently in Pain? Yes    Pain Score 5    a little higher today   Pain Location Back    Pain Orientation Lower    Pain Descriptors / Indicators Sore    Pain Type Chronic pain    Pain Onset More than a month ago    Pain Frequency Constant    Aggravating Factors  as she fatigues  during the day              [facility] PT Assessment - 12/04/20 0940      Assessment   Medical Diagnosis chronic LBP without sciatica    Referring Provider (PT) [name] MD    Onset Date/Surgical Date 11/23/19    Hand Dominance Right    Next MD Visit PRN                         [facility] Adult PT Treatment/Exercise - 12/04/20 0940      Ambulation/Gait   Ambulation/Gait Yes    Gait Comments gait with cues for lengthening stride x 100 feet x 2 reps      Exercises   Exercises Lumbar      Lumbar Exercises: Stretches   Lower Trunk Rotation 30 seconds;2 reps    Hip Flexor Stretch Right;Left;1 rep;30 seconds    Prone on Elbows Stretch 1 rep;30 seconds    Press Ups 5 reps    Other Lumbar Stretch Exercise legs supported on physioball with gentle rocking by PT for release of low back  Other Lumbar Stretch Exercise QL stretch x 2 reps x 30 seconds bilaterally      Lumbar Exercises: Aerobic   Nustep level 5 x 4 minutes      Lumbar Exercises: Standing   Other Standing Lumbar Exercises sidestepping without UE support working on mini knee bend      Lumbar Exercises: Supine   Bridge 10 reps    Bridge Limitations 2 sets -- 1 with feet in hooklying and 1 with feet supported on physioball    Other Supine Lumbar Exercises isometric hip flexion x 3 reps R and L      Knee/Hip Exercises: Standing   Hip Flexion Stengthening;Both;10 reps    Hip Flexion Limitations marching with yoga block over head and then arms out to the side    Forward Step Up Right;Left;1 set;10 reps    Forward Step Up Limitations 4" step    Wall Squat 10 reps    Wall Squat Limitations quarter squat at wall    SLS near support surface, dec'ing reliance on UEs                    PT Short Term Goals - 11/27/20 0926      PT SHORT TERM GOAL #1   Title Ind with initial HEP    Status Achieved             PT Long Term Goals - 12/02/20 9924      PT LONG TERM GOAL #1   Title I with  HEP    Status On-going      PT LONG TERM GOAL #2   Title report overall pain decrease =/> 75% with daily activity    Baseline 10-15% improvement (12/02/20)    Status On-going      PT LONG TERM GOAL #3   Title increase strength Rt hip =/> 5-/5 to support body with standing and transitional movements    Baseline sit to stand and moving in bed has improved    Status Partially Met      PT LONG TERM GOAL #4   Title improve FOTO =/< 38% limited    Baseline 48% limited (12/02/20)    Time 8    Period Weeks    Status On-going      PT LONG TERM GOAL #5   Title Patient able to climb stairs with a reciprocal gait pattern without difficulty.    Time 8    Period Weeks    Status Achieved                 Plan - 12/04/20 1258    Clinical Impression Statement The patient has increased pain today in low back.  She notes overall pain is worse later in the day.  PT discussed continuing with ther ex and walking to build endurance.    PT Treatment/Interventions ADLs/Self Care Home Management;Aquatic Therapy;Cryotherapy;Electrical Stimulation;Moist Heat;Therapeutic exercise;Therapeutic activities;Neuromuscular re-education;Manual techniques;Dry needling;Patient/family education;Taping    PT Next Visit Plan hip strengthcontinue core and mechanics for lifting the puppy and bending to reach the floor; continue LE strengthening.  Rt hip flexor stretch.    PT Home Exercise Plan [id number]    Consulted and Agree with Plan of Care Patient           Patient will benefit from skilled therapeutic intervention in order to improve the following deficits and impairments:     Visit Diagnosis: Chronic midline low back pain without sciatica     Problem List Patient  Active Problem List   Diagnosis Date Noted  . Hearing loss of left ear 10/06/2020  . Encounter for chronic pain management 01/30/2020  . CKD stage G3a/A1, GFR 45-59 and albumin creatinine ratio <30 mg/g ([place]) 10/02/2019  . Left foot pain  01/10/2015  . Severe obesity (BMI >= 40) ([place]) 08/28/2014  . Cataract 08/12/2014  . Hypothyroidism 05/28/2014  . Lumbar spondylosis 02/26/2014  . HLD (hyperlipidemia) 02/01/2014  . Essential hypertension 09/20/2011  . Osteoarthritis of both knees 09/20/2011  . MORBID OBESITY 04/22/2010  . POSTMENOPAUSAL STATUS 04/22/2010  . TROCHANTERIC BURSITIS, RIGHT 12/03/2009  . Goiter 04/01/2009  . Diabetes mellitus ([place]) 04/01/2009    [place], [place] 12/04/2020, 1:00 PM  [facility] [place], [place], [address] Phone: [phone]   Fax:  [phone]  Name: Regina Houston MRN: [id number] Date of Birth: [birth year]

## 2020-12-09 ENCOUNTER — Encounter: Payer: Medicare Other | Admitting: Physical Therapy

## 2020-12-09 ENCOUNTER — Other Ambulatory Visit: Payer: Self-pay | Admitting: Family Medicine

## 2020-12-09 ENCOUNTER — Other Ambulatory Visit: Payer: Self-pay

## 2020-12-09 ENCOUNTER — Ambulatory Visit (INDEPENDENT_AMBULATORY_CARE_PROVIDER_SITE_OTHER): Payer: Medicare Other | Admitting: Physical Therapy

## 2020-12-09 DIAGNOSIS — M545 Low back pain, unspecified: Secondary | ICD-10-CM | POA: Diagnosis not present

## 2020-12-09 DIAGNOSIS — M47816 Spondylosis without myelopathy or radiculopathy, lumbar region: Secondary | ICD-10-CM

## 2020-12-09 DIAGNOSIS — G8929 Other chronic pain: Secondary | ICD-10-CM | POA: Diagnosis not present

## 2020-12-09 DIAGNOSIS — E039 Hypothyroidism, unspecified: Secondary | ICD-10-CM

## 2020-12-09 NOTE — Therapy (Signed)
[place], [place], [address] Phone: [phone]   Fax:  [phone]  Physical Therapy Treatment  Patient Details  Name: Regina Houston MRN: [id number] Date of Birth: July 19, 1951 Referring Provider (PT): [name] MD   Encounter Date: 12/09/2020   PT End of Session - 12/09/20 1238    Visit Number 12    Date for PT Re-Evaluation 12/16/20    Authorization Type Medicare    Authorization - Visit Number 12    Progress Note Due on Visit 20    PT Start Time 1233    PT Stop Time 1315    PT Time Calculation (min) 42 min    Activity Tolerance Patient tolerated treatment well    Behavior During Therapy [facility] for tasks assessed/performed           Past Medical History:  Diagnosis Date  . Diabetes mellitus 2007  . Diverticulitis   . Hyperlipidemia   . Obesity     Past Surgical History:  Procedure Laterality Date  . ABDOMINAL HYSTERECTOMY  2008   fibroids  . BIOPSY BREAST  2007   benign calcification  . 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 BREAST BIOPSY Left    needle core biopsy, benign  . Capsulotomy MPJ Right 12/31/2016   RT FOOT, 3rd MPJ  . FOOT SURGERY  2004   hammer toe  . Hammer Toe Repair Right 12/31/2016   RT #3, #5 toes    There were no vitals filed for this visit.   Subjective Assessment - 12/09/20 1238    Subjective Pt reports increased soreness in bilat low back since shoveling driveway.    Patient Stated Goals Get limber and build up stamina    Currently in Pain? Yes    Pain Score 4     Pain Location Back    Pain Orientation Lower    Pain Descriptors / Indicators Sore              [facility] PT Assessment - 12/09/20 0001      Assessment   Medical Diagnosis chronic LBP without sciatica    Referring Provider (PT) [name] MD    Onset Date/Surgical Date 11/23/19    Hand Dominance Right    Next MD Visit PRN            [facility] Adult PT  Treatment/Exercise - 12/09/20 0001      Lumbar Exercises: Stretches   Passive Hamstring Stretch Right;Left;2 reps;30 seconds   supine with strap   Lower Trunk Rotation 60 seconds   LE on [place] ball; rocking side to side.   Hip Flexor Stretch Left;Right;2 reps;30 seconds   seated     Lumbar Exercises: Aerobic   Nustep L4: 5.5 min - legs only      Lumbar Exercises: Standing   Wall Slides 10 reps;5 seconds   with ball squeeze   Wall Slides Limitations clicking in Rt knee, mild increase in Rt knee pain and fatigue    Other Standing Lumbar Exercises single arm farmers carry 80 ft x 2 reps, 5# KB.  Repeated with 10# KB each hand with one lap - 80 ft.    Other Standing Lumbar Exercises sidestepping without UE support working on mini knee bend (17 ft each direction). Marching (high knee in place) while holding yoga block overhead x 3 steps, then holding out in front of chest x 10, then mini squat to overhead press with yoga  block x 10.       Lumbar Exercises: Supine   Bridge 10 reps;3 seconds    Bridge Limitations LEs on [place] pball.    Other Supine Lumbar Exercises Hooklying with core engaged and holding/ reaching [place]ite pball overhead and back to lap x 10, then circles x 5 each direction.                    PT Short Term Goals - 11/27/20 0926      PT SHORT TERM GOAL #1   Title Ind with initial HEP    Status Achieved             PT Long Term Goals - 12/02/20 2878      PT LONG TERM GOAL #1   Title I with HEP    Status On-going      PT LONG TERM GOAL #2   Title report overall pain decrease =/> 75% with daily activity    Baseline 10-15% improvement (12/02/20)    Status On-going      PT LONG TERM GOAL #3   Title increase strength Rt hip =/> 5-/5 to support body with standing and transitional movements    Baseline sit to stand and moving in bed has improved    Status Partially Met      PT LONG TERM GOAL #4   Title improve FOTO =/< 38% limited    Baseline 48% limited  (12/02/20)    Time 8    Period Weeks    Status On-going      PT LONG TERM GOAL #5   Title Patient able to climb stairs with a reciprocal gait pattern without difficulty.    Time 8    Period Weeks    Status Achieved                 Plan - 12/09/20 1659    Clinical Impression Statement Pt reported reduction of low back pain after completing seated and supine exercises.  She was able to tolerate unilateral 10# farmers carry without increase in pain, but fatigued quickly.  Offered pt opportunity to trial aquatic therapy as means for continued conditioning with the benefit of offloading properties of the water; pt will think it over and let therapist know at next visit.  Overall, her strength and endurance has improved.  Plan to continue to progress towards remaining goals.    PT Treatment/Interventions ADLs/Self Care Home Management;Aquatic Therapy;Cryotherapy;Electrical Stimulation;Moist Heat;Therapeutic exercise;Therapeutic activities;Neuromuscular re-education;Manual techniques;Dry needling;Patient/family education;Taping    PT Next Visit Plan hip and lumbar strengthening.  continue core and mechanics for lifting the puppy and bending to reach the floor; continue LE strengthening.    PT Home Exercise Plan [email or web address]    Consulted and Agree with Plan of Care Patient           Patient will benefit from skilled therapeutic intervention in order to improve the following deficits and impairments:     Visit Diagnosis: Chronic midline low back pain without sciatica     Problem List Patient Active Problem List   Diagnosis Date Noted  . Hearing loss of left ear 10/06/2020  . Encounter for chronic pain management 01/30/2020  . CKD stage G3a/A1, GFR 45-59 and albumin creatinine ratio <30 mg/g ([place]) 10/02/2019  . Left foot pain 01/10/2015  . Severe obesity (BMI >= 40) ([place]) 08/28/2014  . Cataract 08/12/2014  . Hypothyroidism 05/28/2014  . Lumbar spondylosis 02/26/2014  . HLD  (hyperlipidemia) 02/01/2014  . Essential  hypertension 09/20/2011  . Osteoarthritis of both knees 09/20/2011  . MORBID OBESITY 04/22/2010  . POSTMENOPAUSAL STATUS 04/22/2010  . TROCHANTERIC BURSITIS, RIGHT 12/03/2009  . Goiter 04/01/2009  . Diabetes mellitus ([place]) 04/01/2009   Kerin Perna, PTA 12/09/20 5:09 PM  [place], [place], [address] Phone: [phone]   Fax:  [phone]  Name: Regina Houston MRN: [id number] Date of Birth: 1951/05/18

## 2020-12-11 ENCOUNTER — Encounter: Payer: Self-pay | Admitting: Physical Therapy

## 2020-12-11 ENCOUNTER — Other Ambulatory Visit: Payer: Self-pay

## 2020-12-11 ENCOUNTER — Ambulatory Visit (INDEPENDENT_AMBULATORY_CARE_PROVIDER_SITE_OTHER): Payer: Medicare Other | Admitting: Physical Therapy

## 2020-12-11 DIAGNOSIS — G8929 Other chronic pain: Secondary | ICD-10-CM

## 2020-12-11 DIAGNOSIS — M545 Low back pain, unspecified: Secondary | ICD-10-CM

## 2020-12-11 NOTE — Therapy (Signed)
[place], [place], [address] Phone: [phone]   Fax:  [phone]  Physical Therapy Treatment  Patient Details  Name: Regina Houston MRN: [id number] Date of Birth: May 19, 1951 Referring Provider (PT): [name] MD   Encounter Date: 12/11/2020   PT End of Session - 12/11/20 0931    Visit Number 13    Date for PT Re-Evaluation 12/16/20    Authorization Type Medicare    Authorization - Visit Number 13    Progress Note Due on Visit 20    PT Start Time 0931    PT Stop Time 1015    PT Time Calculation (min) 44 min    Activity Tolerance Patient tolerated treatment well    Behavior During Therapy [facility] for tasks assessed/performed           Past Medical History:  Diagnosis Date  . Diabetes mellitus 2007  . Diverticulitis   . Hyperlipidemia   . Obesity     Past Surgical History:  Procedure Laterality Date  . ABDOMINAL HYSTERECTOMY  2008   fibroids  . BIOPSY BREAST  2007   benign calcification  . 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 BREAST BIOPSY Left    needle core biopsy, benign  . Capsulotomy MPJ Right 12/31/2016   RT FOOT, 3rd MPJ  . FOOT SURGERY  2004   hammer toe  . Hammer Toe Repair Right 12/31/2016   RT #3, #5 toes    There were no vitals filed for this visit.   Subjective Assessment - 12/11/20 0932    Subjective Patient reports decreased soreness today to 2/10. She says she is definitely more mobile overall.    Pertinent History DM, low back pain, OA, HTN, Rt foot surgery    Patient Stated Goals Get limber and build up stamina    Currently in Pain? Yes    Pain Score 2     Pain Location Back    Pain Orientation Lower    Pain Descriptors / Indicators Sore    Pain Type Chronic pain              [facility] PT Assessment - 12/11/20 0001      Strength   Right Hip Extension 5/5    Right Hip External Rotation  5/5    Right Hip Internal Rotation 5/5     Right Hip ABduction 5/5    Left Hip Flexion 5/5    Left Hip Extension 5/5    Left Hip External Rotation 5/5    Left Hip Internal Rotation 4+/5    Left Hip ABduction 5/5                         [facility] Adult PT Treatment/Exercise - 12/11/20 0001      Lumbar Exercises: Stretches   Passive Hamstring Stretch Right;Left;2 reps;30 seconds   supine with strap   Hip Flexor Stretch Left;Right;2 reps;30 seconds   seated     Lumbar Exercises: Aerobic   Nustep L4: 5 min - legs only      Lumbar Exercises: Standing   Wall Slides 10 reps;5 seconds   with ball squeeze   Other Standing Lumbar Exercises suitcase carry 80 ft x 2 reps, 5# KB.  Repeated with 10# KB each hand with one lap - 80 ft.    Other Standing Lumbar Exercises 2x sidestepping without UE support working on mini knee bend (20 ft  each direction); Marching (high knee in place) while holding yoga block overhead 2  x 10 steps, then holding out in front of chest 2 x 10, then mini squat to overhead press with yoga block 2 x 10.      Lumbar Exercises: Supine   Bridge 10 reps;3 seconds    Bridge Limitations LEs on Busic pball.                  PT Education - 12/11/20 1015    Education Details HEP progressed    Person(s) Educated Patient    Methods Explanation;Demonstration;Handout    Comprehension Verbalized understanding;Returned demonstration            PT Short Term Goals - 11/27/20 0926      PT SHORT TERM GOAL #1   Title Ind with initial HEP    Status Achieved             PT Long Term Goals - 12/11/20 4097      PT LONG TERM GOAL #1   Title I with HEP    Status Partially Met      PT LONG TERM GOAL #3   Title increase strength Rt hip =/> 5-/5 to support body with standing and transitional movements    Status Partially Met                 Plan - 12/11/20 1300    Clinical Impression Statement Patient with decreased pain today in back and knee. She was able to progress TE without  difficulty. Discussed trying aquatic PT again. One more visit until end of POC.    Comorbidities DM, low back pain, OA, HTN, Rt foot surgery    PT Frequency 2x / week    PT Duration 8 weeks    PT Treatment/Interventions ADLs/Self Care Home Management;Aquatic Therapy;Cryotherapy;Electrical Stimulation;Moist Heat;Therapeutic exercise;Therapeutic activities;Neuromuscular re-education;Manual techniques;Dry needling;Patient/family education;Taping    PT Next Visit Plan Asssess goals; foto; determine d/c or cont PT with pool    PT [place] and Agree with Plan of Care Patient           Patient will benefit from skilled therapeutic intervention in order to improve the following deficits and impairments:  Pain,Impaired flexibility,Decreased strength,Increased muscle spasms  Visit Diagnosis: Chronic midline low back pain without sciatica     Problem List Patient Active Problem List   Diagnosis Date Noted  . Hearing loss of left ear 10/06/2020  . Encounter for chronic pain management 01/30/2020  . CKD stage G3a/A1, GFR 45-59 and albumin creatinine ratio <30 mg/g ([place]) 10/02/2019  . Left foot pain 01/10/2015  . Severe obesity (BMI >= 40) ([place]) 08/28/2014  . Cataract 08/12/2014  . Hypothyroidism 05/28/2014  . Lumbar spondylosis 02/26/2014  . HLD (hyperlipidemia) 02/01/2014  . Essential hypertension 09/20/2011  . Osteoarthritis of both knees 09/20/2011  . MORBID OBESITY 04/22/2010  . POSTMENOPAUSAL STATUS 04/22/2010  . TROCHANTERIC BURSITIS, RIGHT 12/03/2009  . Goiter 04/01/2009  . Diabetes mellitus ([place]) 04/01/2009    Madelyn Flavors PT 12/11/2020, 1:06 PM  [facility] [place], [place], [address] Phone: [phone]   Fax:  [phone]  Name: Regina Houston MRN: [id number] Date of Birth: 19-May-1951

## 2020-12-11 NOTE — Patient Instructions (Signed)
Access Code: [email or web address] URL: [email or web address] Date: 12/11/2020 Prepared by: Almyra Free  Exercises Supine Bridge - 1 x daily - 7 x weekly - 1-2 sets - 5-10 reps Supine Lower Trunk Rotation - 2 x daily - 7 x weekly - 1 sets - 5 reps - 10 sec hold Hooklying Hamstring Stretch with Strap - 2 x daily - 7 x weekly - 1 sets - 2-3 reps - 30 sec hold Supine Piriformis Stretch with Foot on Ground - 2 x daily - 7 x weekly - 1 sets - 2 reps - 20 sec hold Sidelying Hip Abduction - 1 x daily - 7 x weekly - 1 sets - 10 reps Clamshell - 1 x daily - 7 x weekly - 1 sets - 10 reps Sit to Stand - 2 x daily - 7 x weekly - 1 sets - 5 reps Seated Hip Flexor Stretch - 2 x daily - 7 x weekly - 3 reps - 1 sets - 30-60 sec hold Sidestepping - 1 x daily - 7 x weekly - 3 sets - 10 reps Standing March - 1 x daily - 7 x weekly - 3 sets - 10 reps Kettlebell Suitcase Carry - 1 x daily - 7 x weekly - 3 sets - 10 reps

## 2020-12-15 ENCOUNTER — Encounter: Payer: Self-pay | Admitting: Physical Therapy

## 2020-12-15 ENCOUNTER — Other Ambulatory Visit: Payer: Self-pay

## 2020-12-15 ENCOUNTER — Ambulatory Visit (INDEPENDENT_AMBULATORY_CARE_PROVIDER_SITE_OTHER): Payer: Medicare Other | Admitting: Physical Therapy

## 2020-12-15 ENCOUNTER — Other Ambulatory Visit: Payer: Self-pay | Admitting: Family Medicine

## 2020-12-15 DIAGNOSIS — E119 Type 2 diabetes mellitus without complications: Secondary | ICD-10-CM

## 2020-12-15 DIAGNOSIS — M545 Low back pain, unspecified: Secondary | ICD-10-CM | POA: Diagnosis not present

## 2020-12-15 DIAGNOSIS — G8929 Other chronic pain: Secondary | ICD-10-CM

## 2020-12-15 NOTE — Therapy (Addendum)
[place], [place], [address] Phone: [phone]   Fax:  [phone]  Physical Therapy Treatment and Discharge Summary Patient Details  Name: Regina Houston MRN: [id number] Date of Birth: 1951-08-08 Referring Provider (PT): [name] MD   Encounter Date: 12/15/2020   PT End of Session - 12/15/20 0932    Visit Number 14    Number of Visits 16    Date for PT Re-Evaluation 12/16/20    Authorization Type Medicare    Authorization - Visit Number 14    Progress Note Due on Visit 20    PT Start Time 0849    PT Stop Time 0930    PT Time Calculation (min) 41 min    Activity Tolerance Patient tolerated treatment well    Behavior During Therapy [facility] for tasks assessed/performed           Past Medical History:  Diagnosis Date  . Diabetes mellitus 2007  . Diverticulitis   . Hyperlipidemia   . Obesity     Past Surgical History:  Procedure Laterality Date  . ABDOMINAL HYSTERECTOMY  2008   fibroids  . BIOPSY BREAST  2007   benign calcification  . 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 BREAST BIOPSY Left    needle core biopsy, benign  . Capsulotomy MPJ Right 12/31/2016   RT FOOT, 3rd MPJ  . FOOT SURGERY  2004   hammer toe  . Hammer Toe Repair Right 12/31/2016   RT #3, #5 toes    There were no vitals filed for this visit.   Subjective Assessment - 12/15/20 0855    Subjective Pt reports she is stiff this morning.  She is not interested in aquatic therapy at this time; maybe in the spring when it is warmer.  She voices readiness to d/c after today's visit.    Patient Stated Goals Get limber and build up stamina    Currently in Pain? Yes    Pain Score 3     Pain Location Back    Pain Orientation Lower    Pain Descriptors / Indicators Sore    Aggravating Factors  sometimes bending over    Pain Relieving Factors bridges, TENS              [facility] PT Assessment -  12/15/20 0001      Assessment   Medical Diagnosis chronic LBP without sciatica    Referring Provider (PT) [name] MD    Onset Date/Surgical Date 11/23/19    Hand Dominance Right    Next MD Visit PRN    Prior Therapy yes            [facility] Adult PT Treatment/Exercise - 12/15/20 0001      Lumbar Exercises: Stretches   Passive Hamstring Stretch Right;Left;2 reps   supine with strap   Lower Trunk Rotation 4 reps;10 seconds    Hip Flexor Stretch Right;Left;2 reps;30 seconds;60 seconds    Piriformis Stretch Right;Left;2 reps;30 seconds      Lumbar Exercises: Aerobic   Nustep L5: 5 min legs only      Lumbar Exercises: Standing   Other Standing Lumbar Exercises high knee marching with core engaged, arms overhead and also 90 deg flexion, with cues for slow, controlled foot descent x 20 x 2 sets.  Slow side stepping x 16 ft Rt/Lt x 2 sets      Lumbar Exercises: Seated   Sit to Stand 5 reps  cues for hip hinge     Lumbar Exercises: Supine   Bridge 10 reps;3 seconds      Lumbar Exercises: Sidelying   Clam Right;Left;15 reps    Hip Abduction Right;Left;10 reps                    PT Short Term Goals - 11/27/20 0926      PT SHORT TERM GOAL #1   Title Ind with initial HEP    Status Achieved             PT Long Term Goals - 12/15/20 0913      PT LONG TERM GOAL #1   Title I with HEP    Time 8    Period Weeks    Status Achieved      PT LONG TERM GOAL #2   Title report overall pain decrease =/> 75% with daily activity    Baseline 25% decrease in pain with daily activity    Time 8    Period Weeks    Status Not Met      PT LONG TERM GOAL #3   Title increase strength Rt hip =/> 5-/5 to support body with standing and transitional movements    Status Partially Met      PT LONG TERM GOAL #4   Title improve FOTO =/< 38% limited    Baseline 48% limited (12/02/20)    Time 8    Period Weeks    Status Not Met      PT LONG TERM GOAL #5   Title Patient  able to climb stairs with a reciprocal gait pattern without difficulty.    Time 8    Period Weeks    Status Achieved                 Plan - 12/15/20 0920    Clinical Impression Statement Reviewed land based exercises with good tolerance.  Occasional complaint of Rt knee pain with standing exercises. Pt not interested in aquatic therapy at this time.  Pt has partially met her goals, but verbalized readiness to d/c to HEP at this time.    Comorbidities DM, low back pain, OA, HTN, Rt foot surgery    Rehab Potential Excellent    PT Frequency 2x / week    PT Duration 8 weeks    PT Treatment/Interventions ADLs/Self Care Home Management;Aquatic Therapy;Cryotherapy;Electrical Stimulation;Moist Heat;Therapeutic exercise;Therapeutic activities;Neuromuscular re-education;Manual techniques;Dry needling;Patient/family education;Taping    PT Next Visit Plan Spoke to supervising PT; will d/c at this time.    PT Home Exercise Plan [email or web address]    Consulted and Agree with Plan of Care Patient           Patient will benefit from skilled therapeutic intervention in order to improve the following deficits and impairments:  Pain,Impaired flexibility,Decreased strength,Increased muscle spasms  Visit Diagnosis: Chronic midline low back pain without sciatica     Problem List Patient Active Problem List   Diagnosis Date Noted  . Hearing loss of left ear 10/06/2020  . Encounter for chronic pain management 01/30/2020  . CKD stage G3a/A1, GFR 45-59 and albumin creatinine ratio <30 mg/g ([place]) 10/02/2019  . Left foot pain 01/10/2015  . Severe obesity (BMI >= 40) ([place]) 08/28/2014  . Cataract 08/12/2014  . Hypothyroidism 05/28/2014  . Lumbar spondylosis 02/26/2014  . HLD (hyperlipidemia) 02/01/2014  . Essential hypertension 09/20/2011  . Osteoarthritis of both knees 09/20/2011  . MORBID OBESITY 04/22/2010  . POSTMENOPAUSAL STATUS 04/22/2010  .  TROCHANTERIC BURSITIS, RIGHT 12/03/2009  . Goiter  04/01/2009  . Diabetes mellitus ([place]) 04/01/2009   Regina Houston, PTA 12/15/20 9:33 AM  [facility] [place], [place], [address] Phone: [phone]   Fax:  [phone]  Name: Regina Houston MRN: [id number] Date of Birth: [birth year]  PHYSICAL THERAPY DISCHARGE SUMMARY  Visits from Start of Care: 14  Current functional level related to goals / functional outcomes: See above    Remaining deficits: See above   Education / Equipment: HEP Plan: Patient agrees to discharge.  Patient goals were partially met. Patient is being discharged due to being pleased with the current functional level.  ?????    Madelyn Flavors, PT 12/15/20 2:46 PM  [facility] [place], [place] [address]  [phone] (office) [phone] (fax)

## 2020-12-22 ENCOUNTER — Other Ambulatory Visit: Payer: Self-pay | Admitting: Family Medicine

## 2020-12-22 DIAGNOSIS — M47816 Spondylosis without myelopathy or radiculopathy, lumbar region: Secondary | ICD-10-CM

## 2021-01-27 ENCOUNTER — Other Ambulatory Visit: Payer: Self-pay | Admitting: Nurse Practitioner

## 2021-01-30 ENCOUNTER — Other Ambulatory Visit: Payer: Self-pay | Admitting: *Deleted

## 2021-01-30 ENCOUNTER — Other Ambulatory Visit: Payer: Self-pay | Admitting: Family Medicine

## 2021-02-03 ENCOUNTER — Ambulatory Visit: Payer: Medicare Other | Admitting: Family Medicine

## 2021-02-11 ENCOUNTER — Encounter: Payer: Self-pay | Admitting: Family Medicine

## 2021-02-11 ENCOUNTER — Ambulatory Visit (INDEPENDENT_AMBULATORY_CARE_PROVIDER_SITE_OTHER): Payer: Medicare Other | Admitting: Family Medicine

## 2021-02-11 ENCOUNTER — Other Ambulatory Visit: Payer: Self-pay

## 2021-02-11 VITALS — BP 135/65 | HR 72 | Ht 64.0 in | Wt 269.0 lb

## 2021-02-11 DIAGNOSIS — E039 Hypothyroidism, unspecified: Secondary | ICD-10-CM | POA: Diagnosis not present

## 2021-02-11 DIAGNOSIS — E119 Type 2 diabetes mellitus without complications: Secondary | ICD-10-CM | POA: Diagnosis not present

## 2021-02-11 DIAGNOSIS — G8929 Other chronic pain: Secondary | ICD-10-CM

## 2021-02-11 DIAGNOSIS — M47816 Spondylosis without myelopathy or radiculopathy, lumbar region: Secondary | ICD-10-CM

## 2021-02-11 DIAGNOSIS — I10 Essential (primary) hypertension: Secondary | ICD-10-CM | POA: Diagnosis not present

## 2021-02-11 LAB — POCT GLYCOSYLATED HEMOGLOBIN (HGB A1C): Hemoglobin A1C: 5.8 % — AB (ref 4.0–5.6)

## 2021-02-11 MED ORDER — TRAMADOL HCL 50 MG PO TABS: ORAL_TABLET | ORAL | 0 refills | Status: DC

## 2021-02-11 MED ORDER — LISINOPRIL 20 MG PO TABS: 20.0000 mg | ORAL_TABLET | Freq: Every day | ORAL | 1 refills | Status: DC

## 2021-02-11 NOTE — Assessment & Plan Note (Addendum)
Plan to recheck thyroid now she has lost a lot of weight. She is doing great.

## 2021-02-11 NOTE — Assessment & Plan Note (Signed)
Well controlled.  Continue current regimen. F/U in 4 months.  If still great then we may dec metformin dose.

## 2021-02-11 NOTE — Assessment & Plan Note (Signed)
She is really worked hard to lose weight and make some major dietary changes and get active.  She is doing phenomenally.  In fact she is lost over 20 pounds.  Just really encouraged her to keep up the great work and love to see her continue to lose weight her A1c is also down which is wonderful.  Would like to be able to start to decrease her medication hopefully her blood pressure will follow suit as well.  BMI is now down to 46.

## 2021-02-11 NOTE — Assessment & Plan Note (Signed)
BP borderline today but reports home BPs running 120s at home.  Will continue to monitor.

## 2021-02-11 NOTE — Progress Notes (Signed)
Established Patient Office Visit  Subjective:  Patient ID: Regina Houston, female    DOB: Sep 07, 1951  Age: 70 y.o. MRN: [id number]  CC:  Chief Complaint  Patient presents with  . Diabetes    HPI Regina Houston presents for   Diabetes - no hypoglycemic events. No wounds or sores that are not healing well. No increased thirst or urination. Checking glucose at home. Taking medications as prescribed without any side effects.  Hypertension- Pt denies chest pain, SOB, dizziness, or heart palpitations.  Taking meds as directed w/o problems.  Denies medication side effects.    Chronic pain management for her lumbar spondylosis.  Currently on tramadol 90 pills/month.  Due for updated urine drug screen today.  She is doing well with her regimen. PT was helpful and she has been try to walk more regularly now that she has a new puppy which has been really exciting his name is Oreo.  Past Medical History:  Diagnosis Date  . Diabetes mellitus 2007  . Diverticulitis   . Hyperlipidemia   . Obesity     Past Surgical History:  Procedure Laterality Date  . ABDOMINAL HYSTERECTOMY  2008   fibroids  . BIOPSY BREAST  2007   benign calcification  . 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 BREAST BIOPSY Left    needle core biopsy, benign  . Capsulotomy MPJ Right 12/31/2016   RT FOOT, 3rd MPJ  . FOOT SURGERY  2004   hammer toe  . Hammer Toe Repair Right 12/31/2016   RT #3, #5 toes    Family History  Problem Relation Age of Onset  . Hypertension Mother   . Stroke Mother   . Diabetes Mother   . Heart disease Mother 18  . Kidney disease Father        kidney failure  . Heart disease Father 55  . Heart disease Maternal Grandmother        MI  . Diabetes Maternal Grandmother     Social History   Socioeconomic History  . Marital status: Single    Spouse name: Not on file  . Number of children: 0  . Years of education: BA  . Highest education level: Bachelor's degree (e.g.,  BA, AB, BS)  Occupational History  . Occupation: retired    Comment: [employer]  Tobacco Use  . Smoking status: Never Smoker  . Smokeless tobacco: Never Used  Vaping Use  . Vaping Use: Never used  Substance and Sexual Activity  . Alcohol use: No  . Drug use: No  . Sexual activity: Not Currently  Other Topics Concern  . Not on file  Social History Narrative   Walks  2 times a week for 30 minutes a day. Patient has retired from [employer] and enjoying that   Social Determinants of Radio broadcast assistant Strain: Not on [employer] Insecurity: Not on file  Transportation Needs: Not on file  Physical Activity: Not on file  Stress: Not on file  Social Connections: Not on file  Intimate Partner Violence: Not on file    Outpatient Medications Prior to Visit  Medication Sig Dispense Refill  . AMBULATORY NON FORMULARY MEDICATION Medication Name: Glucometer.  STrips and lancets to test twice daily.   Dx 250.00. 1 Units 2  . calcium carbonate (OS-CAL) 600 MG TABS Take 600 mg by mouth 2 (two) times daily with a meal.    . celecoxib (CELEBREX) 200 MG capsule TAKE 1 TO 2  CAPSULES BY MOUTH ONCE DAILY AS NEEDED FOR PAIN 180 capsule 1  . EUTHYROX 100 MCG tablet Take 1 tablet by mouth once daily 90 tablet 1  . fish oil-omega-3 fatty acids 1000 MG capsule Take 2 g by mouth daily.    . furosemide (LASIX) 20 MG tablet Take 1 tablet by mouth once daily 90 tablet 0  . glucose blood (ONETOUCH ULTRA) test strip USE 1 STRIP TO CHECK GLUCOSE  DAILY 100 each 3  . metFORMIN (GLUCOPHAGE) 500 MG tablet TAKE 1 TABLET BY MOUTH TWICE DAILY WITH MEALS 180 tablet 1  . Multiple Vitamin (THERA) TABS Take 1 tablet by mouth.    . simvastatin (ZOCOR) 20 MG tablet TAKE 1 TABLET BY MOUTH AT BEDTIME 90 tablet 3  . lisinopril (ZESTRIL) 20 MG tablet Take 1 tablet by mouth once daily 90 tablet 1  . traMADol (ULTRAM) 50 MG tablet TAKE 1 TABLET BY MOUTH EVERY 8 HOURS AS NEEDED FOR SEVERE PAIN 90 tablet 0   No  facility-administered medications prior to visit.    Allergies  Allergen Reactions  . Latex Rash  . Tape Rash    ROS Review of Systems    Objective:    Physical Exam Constitutional:      Appearance: She is well-developed.  HENT:     Head: Normocephalic and atraumatic.  Cardiovascular:     Rate and Rhythm: Normal rate and regular rhythm.     Heart sounds: Normal heart sounds.  Pulmonary:     Effort: Pulmonary effort is normal.     Breath sounds: Normal breath sounds.  Skin:    General: Skin is warm and dry.  Neurological:     Mental Status: She is alert and oriented to person, place, and time.  Psychiatric:        Behavior: Behavior normal.     BP 135/65   Pulse 72   Ht 5\' 4"  (1.626 m)   Wt 269 lb (122 kg)   SpO2 100%   BMI 46.17 kg/m  Wt Readings from Last 3 Encounters:  02/11/21 269 lb (122 kg)  10/06/20 293 lb (132.9 kg)  09/22/20 293 lb 1.6 oz (132.9 kg)     Health Maintenance Due  Topic Date Due  . OPHTHALMOLOGY EXAM  11/19/2020    There are no preventive care reminders to display for this patient.  Lab Results  Component Value Date   TSH 1.26 06/02/2020   Lab Results  Component Value Date   WBC 6.5 04/03/2015   HGB 13.5 04/03/2015   HCT 40.3 04/03/2015   MCV 89.4 04/03/2015   PLT 345 04/03/2015   Lab Results  Component Value Date   NA 141 06/02/2020   K 4.6 06/02/2020   CO2 27 06/02/2020   GLUCOSE 119 (H) 06/02/2020   BUN 28 (H) 06/02/2020   CREATININE 0.94 (H) 06/02/2020   BILITOT 0.5 06/02/2020   ALKPHOS 97 06/14/2017   AST 22 06/02/2020   ALT 23 06/02/2020   PROT 6.4 06/02/2020   ALBUMIN 4.2 06/14/2017   CALCIUM 9.9 06/02/2020   Lab Results  Component Value Date   CHOL 185 06/02/2020   Lab Results  Component Value Date   HDL 69 06/02/2020   Lab Results  Component Value Date   LDLCALC 96 06/02/2020   Lab Results  Component Value Date   TRIG 102 06/02/2020   Lab Results  Component Value Date   CHOLHDL 2.7  06/02/2020   Lab Results  Component Value Date  HGBA1C 5.8 (A) 02/11/2021      Assessment & Plan:   Problem List Items Addressed This Visit      Cardiovascular and Mediastinum   Essential hypertension    BP borderline today but reports home BPs running 120s at home.  Will continue to monitor.       Relevant Medications   lisinopril (ZESTRIL) 20 MG tablet   Other Relevant Orders   BASIC METABOLIC PANEL WITH GFR   TSH     Endocrine   Hypothyroidism    Plan to recheck thyroid now she has lost a lot of weight. She is doing great.        Relevant Orders   BASIC METABOLIC PANEL WITH GFR   TSH   Diabetes mellitus ([place]) - Primary    Well controlled.  Continue current regimen. F/U in 4 months.  If still great then we may dec metformin dose.        Relevant Medications   lisinopril (ZESTRIL) 20 MG tablet   Other Relevant Orders   POCT glycosylated hemoglobin (Hb A1C) (Completed)   BASIC METABOLIC PANEL WITH GFR   TSH     Musculoskeletal and Integument   Lumbar spondylosis   Relevant Medications   traMADol (ULTRAM) 50 MG tablet     Other   Severe obesity (BMI >= 40) (H[facility])    She is really worked hard to lose weight and make some major dietary changes and get active.  She is doing phenomenally.  In fact she is lost over 20 pounds.  Just really encouraged her to keep up the great work and love to see her continue to lose weight her A1c is also down which is wonderful.  Would like to be able to start to decrease her medication hopefully her blood pressure will follow suit as well.  BMI is now down to 46.      Encounter for chronic pain management    Indication for chronic opioid:bilat knee and hip OA Medication and dose:Tramadol 50mg  TID # pills per month: 90 Last UDS date:02/11/2021 Opioid Treatment Agreement signed (Y/N):Y Opioid Treatment Agreement last reviewed with patient:01/2020 Jane Lew reviewed this encounter (include red flags):Y Follow-up: 4 months.       Relevant Orders   DRUG MONITORING, PANEL 6 WITH CONFIRMATION, URINE      Meds ordered this encounter  Medications  . lisinopril (ZESTRIL) 20 MG tablet    Sig: Take 1 tablet (20 mg total) by mouth daily.    Dispense:  90 tablet    Refill:  1  . traMADol (ULTRAM) 50 MG tablet    Sig: TAKE 1 TABLET BY MOUTH EVERY 8 HOURS AS NEEDED FOR SEVERE PAIN    Dispense:  90 tablet    Refill:  0    Follow-up: Return in about 4 months (around 06/13/2021) for Diabetes follow-up.    [name], MD

## 2021-02-11 NOTE — Assessment & Plan Note (Signed)
Indication for chronic opioid:bilat knee and hip OA Medication and dose:Tramadol 50mg  TID # pills per month: 90 Last UDS date:02/11/2021 Opioid Treatment Agreement signed (Y/N):Y Opioid Treatment Agreement last reviewed with patient:01/2020 [facility] reviewed this encounter (include red flags):Y Follow-up: 4 months.

## 2021-02-12 LAB — DM TEMPLATE

## 2021-02-12 LAB — BASIC METABOLIC PANEL WITH GFR
BUN/Creatinine Ratio: 26 (calc) — ABNORMAL HIGH (ref 6–22)
BUN: 25 mg/dL (ref 7–25)
CO2: 28 mmol/L (ref 20–32)
Calcium: 10 mg/dL (ref 8.6–10.4)
Chloride: 103 mmol/L (ref 98–110)
Creat: 0.96 mg/dL — ABNORMAL HIGH (ref 0.60–0.93)
GFR, Est African American: 68 mL/min/{1.73_m2} (ref >=60)
GFR, Est Non African American: 59 mL/min/{1.73_m2} — ABNORMAL LOW (ref >=60)
Glucose, Bld: 102 mg/dL — ABNORMAL HIGH (ref 65–99)
Potassium: 4.8 mmol/L (ref 3.5–5.3)
Sodium: 139 mmol/L (ref 135–146)

## 2021-02-12 LAB — DRUG MONITORING, PANEL 6 WITH CONFIRMATION, URINE
6 Acetylmorphine: NEGATIVE ng/mL (ref <10)
Alcohol Metabolites: NEGATIVE ng/mL
Amphetamines: NEGATIVE ng/mL (ref <500)
Barbiturates: NEGATIVE ng/mL (ref <300)
Benzodiazepines: NEGATIVE ng/mL (ref <100)
Cocaine Metabolite: NEGATIVE ng/mL (ref <150)
Creatinine: 32.7 mg/dL
Marijuana Metabolite: NEGATIVE ng/mL (ref <20)
Methadone Metabolite: NEGATIVE ng/mL (ref <100)
Opiates: NEGATIVE ng/mL (ref <100)
Oxidant: NEGATIVE ug/mL
Oxycodone: NEGATIVE ng/mL (ref <100)
Phencyclidine: NEGATIVE ng/mL (ref <25)
pH: 5.1 (ref 4.5–9.0)

## 2021-02-12 LAB — TSH: TSH: 1.29 mIU/L (ref 0.40–4.50)

## 2021-03-23 ENCOUNTER — Other Ambulatory Visit: Payer: Self-pay | Admitting: Family Medicine

## 2021-03-23 DIAGNOSIS — M47816 Spondylosis without myelopathy or radiculopathy, lumbar region: Secondary | ICD-10-CM

## 2021-04-10 ENCOUNTER — Telehealth: Payer: Self-pay | Admitting: Family Medicine

## 2021-04-10 NOTE — Chronic Care Management (AMB) (Signed)
  Chronic Care Management   Note  04/10/2021 Name: Regina Houston MRN: [id number] DOB: July 18, 1951  Regina Houston is a 70 y.o. year old female who is a primary care patient of Metheney, Rene Kocher, MD. I reached out to Regina Houston by phone today in response to a referral sent by [name] PCP, Hali Marry, MD.   Regina Houston was given information about Chronic Care Management services today including:  1. CCM service includes personalized support from designated clinical staff supervised by her physician, including individualized plan of care and coordination with other care providers 2. 24/7 contact phone numbers for assistance for urgent and routine care needs. 3. Service will only be billed when office clinical staff spend 20 minutes or more in a month to coordinate care. 4. Only one practitioner may furnish and bill the service in a calendar month. 5. The patient may stop CCM services at any time (effective at the end of the month) by phone call to the office staff.   Patient agreed to services and verbal consent obtained.   Follow up plan:   Regina Houston Upstream Scheduler

## 2021-04-28 DIAGNOSIS — Z23 Encounter for immunization: Secondary | ICD-10-CM | POA: Diagnosis not present

## 2021-04-28 DIAGNOSIS — U071 COVID-19: Secondary | ICD-10-CM | POA: Diagnosis not present

## 2021-05-04 ENCOUNTER — Other Ambulatory Visit: Payer: Self-pay | Admitting: Family Medicine

## 2021-05-04 DIAGNOSIS — M47816 Spondylosis without myelopathy or radiculopathy, lumbar region: Secondary | ICD-10-CM

## 2021-05-12 ENCOUNTER — Ambulatory Visit (INDEPENDENT_AMBULATORY_CARE_PROVIDER_SITE_OTHER): Payer: Medicare Other | Admitting: Pharmacist

## 2021-05-12 DIAGNOSIS — E7800 Pure hypercholesterolemia, unspecified: Secondary | ICD-10-CM | POA: Diagnosis not present

## 2021-05-12 DIAGNOSIS — I10 Essential (primary) hypertension: Secondary | ICD-10-CM

## 2021-05-12 DIAGNOSIS — E119 Type 2 diabetes mellitus without complications: Secondary | ICD-10-CM | POA: Diagnosis not present

## 2021-05-12 DIAGNOSIS — H9319 Tinnitus, unspecified ear: Secondary | ICD-10-CM

## 2021-05-12 NOTE — Progress Notes (Signed)
Chronic Care Management Pharmacy Note  05/12/2021 Name:  Regina Houston MRN:  [id number] DOB:  [birth year]  Summary: addressed DM, HTN, HLD. Patient reports she experiences tinnitus, worse w/BID celebrex versus just one daily dose. Possible contributors include furosemide & celebrex.  Recommendations/Changes made from today's visit: Recommend hearing test for further evaluation of L sided deficit as well as any other workup for tinnitus outside of medication related causes. Patient expressed interest in further workup.  Plan: f/u with pharmacist in 1 year  Subjective: Regina Houston is an 70 y.o. year old female who is a primary patient of Metheney, Rene Kocher, MD.  The CCM team was consulted for assistance with disease management and care coordination needs.    Engaged with patient by telephone for initial visit in response to provider referral for pharmacy case management and/or care coordination services.   Consent to Services:  The patient was given information about Chronic Care Management services, agreed to services, and gave verbal consent prior to initiation of services.  Please see initial visit note for detailed documentation.   Patient Care Team: Hali Marry, MD as PCP - General Darius Bump, [facility] as Pharmacist (Pharmacist)   Objective:  Lab Results  Component Value Date   CREATININE 0.96 (H) 02/11/2021   CREATININE 0.94 (H) 06/02/2020   CREATININE 0.97 (H) 01/30/2020    Lab Results  Component Value Date   HGBA1C 5.8 (A) 02/11/2021   Last diabetic Eye exam:  Lab Results  Component Value Date/Time   HMDIABEYEEXA No Retinopathy 11/20/2019 12:00 AM       Component Value Date/Time   CHOL 185 06/02/2020 0931   TRIG 102 06/02/2020 0931   HDL 69 06/02/2020 0931   CHOLHDL 2.7 06/02/2020 0931   VLDL 15 05/18/2016 1125   LDLCALC 96 06/02/2020 0931    Hepatic Function Latest Ref Rng & Units 06/02/2020 05/31/2019 06/13/2018  Total Protein 6.1 - 8.1 g/dL 6.4 6.4  6.5  Albumin 3.6 - 5.1 g/dL - - -  AST 10 - 35 U/L _0 ALT 6 - 29 U/L _1 Alk Phosphatase 33 - 130 U/L - - -  Total Bilirubin 0.2 - 1.2 mg/dL 0.5 0.4 0.4  Bilirubin, Direct 0.0 - 0.3 mg/dL - - -    Lab Results  Component Value Date/Time   TSH 1.29 02/11/2021 11:56 AM   TSH 1.26 06/02/2020 09:31 AM    CBC Latest Ref Rng & Units 04/03/2015 05/09/2012  WBC 4.0 - 10.5 K/uL 6.5 6.2  Hemoglobin 12.0 - 15.0 g/dL 13.5 13.7  Hematocrit 36.0 - 46.0 % 40.3 41.0  Platelets 150 - 400 K/uL 345 290    Social History   Tobacco Use  Smoking Status Never  Smokeless Tobacco Never   BP Readings from Last 3 Encounters:  02/11/21 135/65  10/06/20 134/64  09/22/20 117/75   Pulse Readings from Last 3 Encounters:  02/11/21 72  10/06/20 68  09/22/20 65   Wt Readings from Last 3 Encounters:  02/11/21 269 lb (122 kg)  10/06/20 293 lb (132.9 kg)  09/22/20 293 lb 1.6 oz (132.9 kg)    Assessment: Review of patient past medical history, allergies, medications, health status, including review of consultants reports, laboratory and other test data, was performed as part of comprehensive evaluation and provision of chronic care management services.   SDOH:  (Social Determinants of Health) assessments and interventions performed:    CCM Care Plan  Allergies  Allergen Reactions  Latex Rash   Tape Rash    Medications Reviewed Today     Reviewed by Darius Bump, [facility] (Pharmacist) on 05/12/21 at [place] List Status: <None>   Medication Order Taking? Sig Documenting Provider Last Dose Status Informant  AMBULATORY NON FORMULARY MEDICATION [id number] Yes Medication Name: Glucometer.  STrips and lancets to test twice daily.   Dx 250.00. Hali Marry, MD Taking Active   calcium carbonate (OS-CAL) 600 MG TABS [id number] Yes Take 600 mg by mouth daily. [provider] Taking Active   celecoxib (CELEBREX) 200 MG capsule [id number] Yes TAKE 1 TO 2 CAPSULES BY MOUTH ONCE  DAILY AS NEEDED FOR PAIN Hali Marry, MD Taking Active   EUTHYROX 100 MCG tablet [id number] Yes Take 1 tablet by mouth once daily Hali Marry, MD Taking Active   fish oil-omega-3 fatty acids 1000 MG capsule [id number] Yes Take 2 g by mouth daily. [provider] Taking Active   furosemide (LASIX) 20 MG tablet [id number] Yes Take 1 tablet by mouth once daily Hali Marry, MD Taking Active   glucose blood [facility] ULTRA) test strip [id number] Yes USE 1 STRIP TO CHECK GLUCOSE  DAILY Hali Marry, MD Taking Active   lisinopril (ZESTRIL) 20 MG tablet [id number] Yes Take 1 tablet (20 mg total) by mouth daily. Hali Marry, MD Taking Active   metFORMIN (GLUCOPHAGE) 500 MG tablet [id number] Yes TAKE 1 TABLET BY MOUTH TWICE DAILY WITH MEALS Madilyn Fireman Rene Kocher, MD Taking Active   Misc Natural Products (APPLE CIDER VINEGAR DIET) TABS [id number] Yes Take 1 tablet by mouth daily. [provider] Taking Active   Multiple Vitamin (THERA) TABS [id number] Yes Take 1 tablet by mouth daily. [provider] Taking Active            Med Note Teddy Spike   Wed Jan 30, 2020  8:33 AM)    simvastatin (ZOCOR) 20 MG tablet [id number] Yes TAKE 1 TABLET BY MOUTH AT BEDTIME Hali Marry, MD Taking Active   traMADol (ULTRAM) 50 MG tablet [id number] Yes TAKE 1 TABLET BY MOUTH EVERY 8 HOURS AS NEEDED FOR SEVERE PAIN Emeterio Reeve, DO Taking Active             Patient Active Problem List   Diagnosis Date Noted   Hearing loss of left ear 10/06/2020   Encounter for chronic pain management 01/30/2020   CKD stage G3a/A1, GFR 45-59 and albumin creatinine ratio <30 mg/g (HCC) 10/02/2019   Left foot pain 01/10/2015   Severe obesity (BMI >= 40) (HCC) 08/28/2014   Cataract 08/12/2014   Hypothyroidism 05/28/2014   Lumbar spondylosis 02/26/2014   HLD (hyperlipidemia) 02/01/2014   Essential hypertension 09/20/2011   Osteoarthritis of both knees  09/20/2011   MORBID OBESITY 04/22/2010   POSTMENOPAUSAL STATUS 04/22/2010   TROCHANTERIC BURSITIS, RIGHT 12/03/2009   Goiter 04/01/2009   Diabetes mellitus ([place]) 04/01/2009    Immunization History  Administered Date(s) Administered   DTP 11/22/2006   Fluad Quad(high Dose 65+) 07/25/2019, 08/20/2020   Influenza Split 10/10/2012   Influenza Whole 11/22/2006, 10/01/2009, 08/22/2010   Influenza, High Dose Seasonal PF 09/02/2018   Influenza,inj,Quad PF,6+ Mos 08/28/2014, 08/26/2015, 08/10/2016   Influenza-Unspecified 10/01/2013, 09/02/2017, 09/02/2018   Moderna SARS-COV2 Booster Vaccination 04/28/2021   Moderna Sars-Covid-2 Vaccination 12/18/2019, 01/18/2020, 09/13/2020   Pneumococcal Conjugate-13 02/19/2014   Pneumococcal Polysaccharide-23 11/22/2005, 04/03/2015   Td 11/22/2006   Tdap 02/22/2017   Zoster Recombinat (Shingrix) 01/23/2019, 03/20/2019   Zoster, Live  05/04/2011    Conditions to be addressed/monitored: HTN, HLD, and DMII  Care Plan : Medication Management  Updates made by Darius Bump, [place] since 05/12/2021 12:00 AM     Problem: DM, HTN, HLD      Long-Range Goal: Disease Progression Prevention   Start Date: 05/12/2021  This Visit's Progress: On track  Priority: High  Note:   Current Barriers:  None at present  Pharmacist Clinical Goal(s):  Over the next 365 days, patient will maintain control of chronic conditions as evidenced by medication fill history, vital signs, and lab values  through collaboration with PharmD and provider.   Interventions: 1:1 collaboration with Hali Marry, MD regarding development and update of comprehensive plan of care as evidenced by provider attestation and co-signature Inter-disciplinary care team collaboration (see longitudinal plan of care) Comprehensive medication review performed; medication list updated in electronic medical record  Diabetes:  Controlled; current treatment:metformin 554m BID; a1c  5.8  Current glucose readings: fasting glucose: 104-117,  Reports hypoglycemic symptoms, very rarely/only w/strenuous exercise, treats appropriately w/snack  Current meal patterns: breakfast: bacon +eggs, coffee, grapefruit; lunch: sandwich + chips, diet Leja tea; dinner: lighter meal, cereal, cube steak + canned tomatoes; snacks: trail mix, cashews, almonds; drinks: water  Current exercise: stretching, walks dog "Oreo"  Recommended continue current regimen,  Hypertension:  Controlled; current treatment:lisinopril 249m furosemide 201m  Current home readings: ~120/70s   Denies hypotensive/hypertensive symptoms  Recommended continue current regimen, and  Hyperlipidemia:  Controlled; current treatment:simvastatin 33m15mish oil omega 3FA; LDL 96  Recommended continue current regimen  Patient Goals/Self-Care Activities Over the next 365 days, patient will:  take medications as prescribed  Follow Up Plan: Telephone follow up appointment with care management team member scheduled for:  1 year     Medication Assistance: None required.  Patient affirms current coverage meets needs.  Patient's preferred pharmacy is:  [id number] [place] [id number]: [phone]: [phone] pill box? Yes Pt endorses 100% compliance  Follow Up:  Patient agrees to Care Plan and Follow-up.  Plan: Telephone follow up appointment with care management team member scheduled for:  1 year

## 2021-05-12 NOTE — Patient Instructions (Signed)
Visit Information   PATIENT GOALS:   Goals Addressed             This Visit's Progress    Medication Management       Patient Goals/Self-Care Activities Over the next 365 days, patient will:  take medications as prescribed  Follow Up Plan: Telephone follow up appointment with care management team member scheduled for:  1 year         Consent to CCM Services: Ms. Wintle was given information about Chronic Care Management services today including:  CCM service includes personalized support from designated clinical staff supervised by her physician, including individualized plan of care and coordination with other care providers 24/7 contact phone numbers for assistance for urgent and routine care needs. Service will only be billed when office clinical staff spend 20 minutes or more in a month to coordinate care. Only one practitioner may furnish and bill the service in a calendar month. The patient may stop CCM services at any time (effective at the end of the month) by phone call to the office staff. The patient will be responsible for cost sharing (co-pay) of up to 20% of the service fee (after annual deductible is met).  Patient agreed to services and verbal consent obtained.   Patient verbalizes understanding of instructions provided today and agrees to view in MyChart.   Telephone follow up appointment with care management team member scheduled for: 1 year  CLINICAL CARE PLAN: Patient Care Plan: Medication Management     Problem Identified: DM, HTN, HLD      Long-Range Goal: Disease Progression Prevention   Start Date: 05/12/2021  This Visit's Progress: On track  Priority: High  Note:   Current Barriers:  None at present  Pharmacist Clinical Goal(s):  Over the next 365 days, patient will maintain control of chronic conditions as evidenced by medication fill history, vital signs, and lab values  through collaboration with PharmD and provider.    Interventions: 1:1 collaboration with Metheney, Catherine D, MD regarding development and update of comprehensive plan of care as evidenced by provider attestation and co-signature Inter-disciplinary care team collaboration (see longitudinal plan of care) Comprehensive medication review performed; medication list updated in electronic medical record  Diabetes:  Controlled; current treatment:metformin 500mg BID; a1c 5.8  Current glucose readings: fasting glucose: 104-117,  Reports hypoglycemic symptoms, very rarely/only w/strenuous exercise, treats appropriately w/snack  Current meal patterns: breakfast: bacon +eggs, coffee, grapefruit; lunch: sandwich + chips, diet Raczynski tea; dinner: lighter meal, cereal, cube steak + canned tomatoes; snacks: trail mix, cashews, almonds; drinks: water  Current exercise: stretching, walks dog "Oreo"  Recommended continue current regimen,  Hypertension:  Controlled; current treatment:lisinopril 20mg, furosemide 20mg;   Current home readings: ~120/70s   Denies hypotensive/hypertensive symptoms  Recommended continue current regimen, and  Hyperlipidemia:  Controlled; current treatment:simvastatin 20mg, fish oil omega 3FA; LDL 96  Recommended continue current regimen  Patient Goals/Self-Care Activities Over the next 365 days, patient will:  take medications as prescribed  Follow Up Plan: Telephone follow up appointment with care management team member scheduled for:  1 year      

## 2021-05-17 NOTE — Addendum Note (Signed)
Addended by: [name] D on: 05/17/2021 03:48 PM   Modules accepted: Orders

## 2021-05-17 NOTE — Progress Notes (Signed)
Referral placed for audiology.

## 2021-06-10 ENCOUNTER — Other Ambulatory Visit: Payer: Self-pay | Admitting: Family Medicine

## 2021-06-10 DIAGNOSIS — E039 Hypothyroidism, unspecified: Secondary | ICD-10-CM

## 2021-06-10 DIAGNOSIS — E119 Type 2 diabetes mellitus without complications: Secondary | ICD-10-CM

## 2021-06-15 ENCOUNTER — Encounter: Payer: Self-pay | Admitting: Family Medicine

## 2021-06-15 ENCOUNTER — Other Ambulatory Visit: Payer: Self-pay

## 2021-06-15 ENCOUNTER — Ambulatory Visit (INDEPENDENT_AMBULATORY_CARE_PROVIDER_SITE_OTHER): Payer: Medicare Other | Admitting: Family Medicine

## 2021-06-15 ENCOUNTER — Other Ambulatory Visit: Payer: Self-pay | Admitting: Family Medicine

## 2021-06-15 VITALS — BP 136/56 | HR 64 | Ht 65.0 in | Wt 252.0 lb

## 2021-06-15 DIAGNOSIS — H9192 Unspecified hearing loss, left ear: Secondary | ICD-10-CM | POA: Diagnosis not present

## 2021-06-15 DIAGNOSIS — N1831 Chronic kidney disease, stage 3a: Secondary | ICD-10-CM | POA: Diagnosis not present

## 2021-06-15 DIAGNOSIS — E7800 Pure hypercholesterolemia, unspecified: Secondary | ICD-10-CM | POA: Diagnosis not present

## 2021-06-15 DIAGNOSIS — E119 Type 2 diabetes mellitus without complications: Secondary | ICD-10-CM

## 2021-06-15 DIAGNOSIS — I10 Essential (primary) hypertension: Secondary | ICD-10-CM

## 2021-06-15 DIAGNOSIS — H9313 Tinnitus, bilateral: Secondary | ICD-10-CM | POA: Diagnosis not present

## 2021-06-15 DIAGNOSIS — H838X3 Other specified diseases of inner ear, bilateral: Secondary | ICD-10-CM | POA: Diagnosis not present

## 2021-06-15 DIAGNOSIS — E039 Hypothyroidism, unspecified: Secondary | ICD-10-CM | POA: Diagnosis not present

## 2021-06-15 DIAGNOSIS — H903 Sensorineural hearing loss, bilateral: Secondary | ICD-10-CM | POA: Diagnosis not present

## 2021-06-15 LAB — COMPLETE METABOLIC PANEL WITH GFR
AG Ratio: 2.1 (calc) (ref 1.0–2.5)
ALT: 12 U/L (ref 6–29)
AST: 18 U/L (ref 10–35)
Albumin: 4.4 g/dL (ref 3.6–5.1)
Alkaline phosphatase (APISO): 88 U/L (ref 37–153)
BUN/Creatinine Ratio: 30 (calc) — ABNORMAL HIGH (ref 6–22)
BUN: 28 mg/dL — ABNORMAL HIGH (ref 7–25)
CO2: 30 mmol/L (ref 20–32)
Calcium: 10.4 mg/dL (ref 8.6–10.4)
Chloride: 103 mmol/L (ref 98–110)
Creat: 0.92 mg/dL (ref 0.60–1.00)
Globulin: 2.1 g/dL (calc) (ref 1.9–3.7)
Glucose, Bld: 95 mg/dL (ref 65–99)
Potassium: 4.6 mmol/L (ref 3.5–5.3)
Sodium: 141 mmol/L (ref 135–146)
Total Bilirubin: 0.5 mg/dL (ref 0.2–1.2)
Total Protein: 6.5 g/dL (ref 6.1–8.1)
eGFR: 66 mL/min/{1.73_m2} (ref >=60)

## 2021-06-15 LAB — LIPID PANEL
Cholesterol: 194 mg/dL (ref <200)
HDL: 77 mg/dL (ref >=50)
LDL Cholesterol (Calc): 100 mg/dL (calc) — ABNORMAL HIGH
Non-HDL Cholesterol (Calc): 117 mg/dL (calc) (ref <130)
Total CHOL/HDL Ratio: 2.5 (calc) (ref <5.0)
Triglycerides: 82 mg/dL (ref <150)

## 2021-06-15 LAB — POCT GLYCOSYLATED HEMOGLOBIN (HGB A1C): Hemoglobin A1C: 5.7 % — AB (ref 4.0–5.6)

## 2021-06-15 LAB — TSH: TSH: 1.08 mIU/L (ref 0.40–4.50)

## 2021-06-15 NOTE — Assessment & Plan Note (Signed)
Well controlled. Continue current regimen. Follow up in  6 mo  

## 2021-06-15 NOTE — Assessment & Plan Note (Signed)
Recheck lipids to make sure that current dose of Zocor is adequate.

## 2021-06-15 NOTE — Assessment & Plan Note (Signed)
Due to recheck TSH. 

## 2021-06-15 NOTE — Patient Instructions (Signed)
Kay to decrease her metformin down to 1 tab daily instead of twice a day. You have done a fabulous job with weight loss!!!!!!

## 2021-06-15 NOTE — Assessment & Plan Note (Signed)
Following renal function every 6 months. 

## 2021-06-15 NOTE — Assessment & Plan Note (Signed)
She has done a phenomenal job with weight loss since she was last here congratulated her on this changes I would like to continue to work on getting her off of some of her medications.

## 2021-06-15 NOTE — Progress Notes (Signed)
Established Patient Office Visit  Subjective:  Patient ID: Regina Houston, female    DOB: 02-22-1951  Age: 70 y.o. MRN: [id number]  CC:  Chief Complaint  Patient presents with   Diabetes   Hypertension     HPI Andreyah Demario presents for   Hypertension- Pt denies chest pain, SOB, dizziness, or heart palpitations.  Taking meds as directed w/o problems.  Denies medication side effects.    Diabetes - no hypoglycemic events. No wounds or sores that are not healing well. No increased thirst or urination. Checking glucose at home. Taking medications as prescribed without any side effects.  She did have a hearing test done today. She has high frequency hearing loss.  She is not quite a candidate for a hearing aid yet but they are to follow her back up in 1 year.  Past Medical History:  Diagnosis Date   Diabetes mellitus 2007   Diverticulitis    Hyperlipidemia    Obesity     Past Surgical History:  Procedure Laterality Date   ABDOMINAL HYSTERECTOMY  2008   fibroids   BIOPSY BREAST  2007   benign calcification   BRAIN SURGERY  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,benign menigioma   BREAST BIOPSY Left    needle core biopsy, benign   Capsulotomy MPJ Right 12/31/2016   RT FOOT, 3rd MPJ   FOOT SURGERY  2004   hammer toe   Hammer Toe Repair Right 12/31/2016   RT #3, #5 toes    Family History  Problem Relation Age of Onset   Hypertension Mother    Stroke Mother    Diabetes Mother    Heart disease Mother 12   Kidney disease Father        kidney failure   Heart disease Father 44   Heart disease Maternal Grandmother        MI   Diabetes Maternal Grandmother     Social History   Socioeconomic History   Marital status: Single    Spouse name: Not on file   Number of children: 0   Years of education: BA   Highest education level: Bachelor's degree (e.g., BA, AB, BS)  Occupational History   Occupation: retired    Comment: [employer]  Tobacco Use   Smoking status: Never   Smokeless tobacco:  Never  Vaping Use   Vaping Use: Never used  Substance and Sexual Activity   Alcohol use: No   Drug use: No   Sexual activity: Not Currently  Other Topics Concern   Not on file  Social History Narrative   Walks  2 times a week for 30 minutes a day. Patient has retired from [employer] and enjoying that   Social Determinants of Radio broadcast assistant Strain: Not on file  Food Insecurity: Not on file  Transportation Needs: Not on file  Physical Activity: Not on file  Stress: Not on file  Social Connections: Not on file  Intimate Partner Violence: Not on file    Outpatient Medications Prior to Visit  Medication Sig Dispense Refill   [place] Medication Name: Glucometer.  STrips and lancets to test twice daily.   Dx 250.00. 1 Units 2   calcium carbonate (OS-CAL) 600 MG TABS Take 600 mg by mouth daily.     celecoxib (CELEBREX) 200 MG capsule TAKE 1 TO 2 CAPSULES BY MOUTH ONCE DAILY AS NEEDED FOR PAIN 180 capsule 1   EUTHYROX 100 MCG tablet Take 1 tablet by mouth once  daily 90 tablet 0   fish oil-omega-3 fatty acids 1000 MG capsule Take 2 g by mouth daily.     furosemide (LASIX) 20 MG tablet Take 1 tablet by mouth once daily 90 tablet 0   glucose blood (ONETOUCH ULTRA) test strip USE 1 STRIP TO CHECK GLUCOSE ONCE DAILY 50 each 0   lisinopril (ZESTRIL) 20 MG tablet Take 1 tablet (20 mg total) by mouth daily. 90 tablet 1   metFORMIN (GLUCOPHAGE) 500 MG tablet TAKE 1 TABLET BY MOUTH TWICE DAILY WITH MEALS 180 tablet 1   Misc Natural Products (APPLE CIDER VINEGAR DIET) TABS Take 1 tablet by mouth daily.     Multiple Vitamin (THERA) TABS Take 1 tablet by mouth daily.     simvastatin (ZOCOR) 20 MG tablet TAKE 1 TABLET BY MOUTH AT BEDTIME 90 tablet 3   traMADol (ULTRAM) 50 MG tablet TAKE 1 TABLET BY MOUTH EVERY 8 HOURS AS NEEDED FOR SEVERE PAIN 90 tablet 0   No facility-administered medications prior to visit.    Allergies  Allergen Reactions   Latex Rash    Tape Rash    ROS Review of Systems    Objective:    Physical Exam Constitutional:      Appearance: Normal appearance. She is well-developed.  HENT:     Head: Normocephalic and atraumatic.  Cardiovascular:     Rate and Rhythm: Normal rate and regular rhythm.     Heart sounds: Normal heart sounds.  Pulmonary:     Effort: Pulmonary effort is normal.     Breath sounds: Normal breath sounds.  Skin:    General: Skin is warm and dry.  Neurological:     Mental Status: She is alert and oriented to person, place, and time.  Psychiatric:        Behavior: Behavior normal.    BP (!) 136/56   Pulse 64   Ht '5\' 5"'$  (1.651 m)   Wt 252 lb (114.3 kg)   SpO2 99%   BMI 41.93 kg/m  Wt Readings from Last 3 Encounters:  06/15/21 252 lb (114.3 kg)  02/11/21 269 lb (122 kg)  10/06/20 293 lb (132.9 kg)     Health Maintenance Due  Topic Date Due   FOOT EXAM  06/02/2021    There are no preventive care reminders to display for this patient.  Lab Results  Component Value Date   TSH 1.29 02/11/2021   Lab Results  Component Value Date   WBC 6.5 04/03/2015   HGB 13.5 04/03/2015   HCT 40.3 04/03/2015   MCV 89.4 04/03/2015   PLT 345 04/03/2015   Lab Results  Component Value Date   NA 139 02/11/2021   K 4.8 02/11/2021   CO2 28 02/11/2021   GLUCOSE 102 (H) 02/11/2021   BUN 25 02/11/2021   CREATININE 0.96 (H) 02/11/2021   BILITOT 0.5 06/02/2020   ALKPHOS 97 06/14/2017   AST 22 06/02/2020   ALT 23 06/02/2020   PROT 6.4 06/02/2020   ALBUMIN 4.2 06/14/2017   CALCIUM 10.0 02/11/2021   Lab Results  Component Value Date   CHOL 185 06/02/2020   Lab Results  Component Value Date   HDL 69 06/02/2020   Lab Results  Component Value Date   LDLCALC 96 06/02/2020   Lab Results  Component Value Date   TRIG 102 06/02/2020   Lab Results  Component Value Date   CHOLHDL 2.7 06/02/2020   Lab Results  Component Value Date   HGBA1C 5.8 (A)  02/11/2021      Assessment &  Plan:   Problem List Items Addressed This Visit       Cardiovascular and Mediastinum   Essential hypertension    Well controlled. Continue current regimen. Follow up in  6 mo        Relevant Orders   TSH   POCT glycosylated hemoglobin (Hb A1C)   Lipid panel   COMPLETE METABOLIC PANEL WITH GFR     Endocrine   Hypothyroidism    Due to recheck TSH.       Relevant Orders   TSH   POCT glycosylated hemoglobin (Hb A1C)   Lipid panel   COMPLETE METABOLIC PANEL WITH GFR   Diabetes mellitus ([place]) - Primary    Well controlled. Continue current regimen. Follow up in  4 mo        Relevant Orders   TSH   POCT glycosylated hemoglobin (Hb A1C)   Lipid panel   COMPLETE METABOLIC PANEL WITH GFR     Nervous and Auditory   Hearing loss of left ear    Had hearing evaluation she is not quite at the point of needing a hearing aid but will follow-up in 1 year.         Genitourinary   CKD stage G3a/A1, GFR 45-59 and albumin creatinine ratio <30 mg/g (HCC)    Following renal function every 6 months.         Other   Severe obesity (BMI >= 40) (HCC)    She has done a phenomenal job with weight loss since she was last here congratulated her on this changes I would like to continue to work on getting her off of some of her medications.        HLD (hyperlipidemia)    Recheck lipids to make sure that current dose of Zocor is adequate.       Relevant Orders   TSH   POCT glycosylated hemoglobin (Hb A1C)   Lipid panel   COMPLETE METABOLIC PANEL WITH GFR    No orders of the defined types were placed in this encounter.  Foot exam performed today.    Follow-up: Return in about 4 months (around 10/16/2021) for Diabetes follow-up.    [name], MD

## 2021-06-15 NOTE — Assessment & Plan Note (Signed)
Had hearing evaluation she is not quite at the point of needing a hearing aid but will follow-up in 1 year.

## 2021-06-15 NOTE — Assessment & Plan Note (Signed)
Well controlled. Continue current regimen. Follow up in  4 mo 

## 2021-06-29 ENCOUNTER — Other Ambulatory Visit: Payer: Self-pay | Admitting: Osteopathic Medicine

## 2021-06-29 DIAGNOSIS — M47816 Spondylosis without myelopathy or radiculopathy, lumbar region: Secondary | ICD-10-CM

## 2021-07-27 ENCOUNTER — Other Ambulatory Visit: Payer: Self-pay | Admitting: Family Medicine

## 2021-07-27 DIAGNOSIS — E119 Type 2 diabetes mellitus without complications: Secondary | ICD-10-CM

## 2021-08-03 ENCOUNTER — Other Ambulatory Visit: Payer: Self-pay | Admitting: Family Medicine

## 2021-08-07 ENCOUNTER — Encounter: Payer: Self-pay | Admitting: Physical Therapy

## 2021-08-10 ENCOUNTER — Other Ambulatory Visit: Payer: Self-pay | Admitting: Family Medicine

## 2021-08-10 DIAGNOSIS — M47816 Spondylosis without myelopathy or radiculopathy, lumbar region: Secondary | ICD-10-CM

## 2021-08-14 DIAGNOSIS — Z23 Encounter for immunization: Secondary | ICD-10-CM | POA: Diagnosis not present

## 2021-08-19 ENCOUNTER — Other Ambulatory Visit: Payer: Self-pay | Admitting: Family Medicine

## 2021-09-03 DIAGNOSIS — H524 Presbyopia: Secondary | ICD-10-CM | POA: Diagnosis not present

## 2021-09-03 DIAGNOSIS — E119 Type 2 diabetes mellitus without complications: Secondary | ICD-10-CM | POA: Diagnosis not present

## 2021-09-03 LAB — HM DIABETES EYE EXAM

## 2021-09-14 ENCOUNTER — Other Ambulatory Visit: Payer: Self-pay | Admitting: Family Medicine

## 2021-09-14 DIAGNOSIS — E039 Hypothyroidism, unspecified: Secondary | ICD-10-CM

## 2021-09-15 ENCOUNTER — Telehealth: Payer: Self-pay | Admitting: *Deleted

## 2021-09-15 NOTE — Telephone Encounter (Signed)
Received fax from pt's pharmacy stating that they have changed manufactures for her Levothyroxine 100 mcg. They require a consent to dispense this different product to her .

## 2021-09-16 NOTE — Telephone Encounter (Signed)
Called pt and informed her that she will need to have her TSH checked 6-8 weeks after starting this she voiced understanding and agreed.

## 2021-09-23 ENCOUNTER — Other Ambulatory Visit: Payer: Self-pay

## 2021-09-23 ENCOUNTER — Ambulatory Visit (INDEPENDENT_AMBULATORY_CARE_PROVIDER_SITE_OTHER): Payer: Medicare Other | Admitting: Family Medicine

## 2021-09-23 VITALS — BP 125/58 | HR 63 | Ht 64.0 in | Wt 249.1 lb

## 2021-09-23 DIAGNOSIS — Z1231 Encounter for screening mammogram for malignant neoplasm of breast: Secondary | ICD-10-CM | POA: Diagnosis not present

## 2021-09-23 DIAGNOSIS — Z0000 Encounter for general adult medical examination without abnormal findings: Secondary | ICD-10-CM

## 2021-09-23 NOTE — Patient Instructions (Addendum)
[place] Maintenance Summary and Written Plan of Care  [name] ,  Thank you for allowing me to perform your Medicare Annual Wellness Visit and for your ongoing commitment to your health.   Health Maintenance & Immunization History Health Maintenance  Topic Date Due   HEMOGLOBIN A1C  12/16/2021   FOOT EXAM  06/15/2022   OPHTHALMOLOGY EXAM  09/03/2022   MAMMOGRAM  11/05/2022   DEXA SCAN  07/24/2024   COLONOSCOPY (Pts 45-60yrs Insurance coverage will need to be confirmed)  09/30/2024   TETANUS/TDAP  02/23/2027   Pneumonia Vaccine 57+ Years old  Completed   INFLUENZA VACCINE  Completed   COVID-19 Vaccine  Completed   Hepatitis C Screening  Completed   Zoster Vaccines- Shingrix  Completed   HPV VACCINES  Aged Out   Immunization History  Administered Date(s) Administered   DTP 11/22/2006   Fluad Quad(high Dose 65+) 07/25/2019, 08/20/2020   Influenza Split 10/10/2012   Influenza Whole 11/22/2006, 10/01/2009, 08/22/2010   Influenza, High Dose Seasonal PF 09/02/2018, 08/14/2021   Influenza,inj,Quad PF,6+ Mos 08/28/2014, 08/26/2015, 08/10/2016   Influenza-Unspecified 10/01/2013, 09/02/2017, 09/02/2018   Moderna SARS-COV2 Booster Vaccination 04/28/2021   Moderna Sars-Covid-2 Vaccination 12/18/2019, 01/18/2020, 09/13/2020   Pfizer Covid-19 Vaccine Bivalent Booster 79yrs & up 08/14/2021   Pneumococcal Conjugate-13 02/19/2014   Pneumococcal Polysaccharide-23 11/22/2005, 04/03/2015   Td 11/22/2006   Tdap 02/22/2017   Zoster Recombinat (Shingrix) 01/23/2019, 03/20/2019   Zoster, Live 05/04/2011    These are the patient goals that we discussed:  Goals Addressed               This Visit's Progress     Patient Stated (pt-stated)        09/23/2021 AWV Goal: Exercise for General Health  Patient will verbalize understanding of the benefits of increased physical activity: Exercising regularly is important. It will improve your overall fitness,  flexibility, and endurance. Regular exercise also will improve your overall health. It can help you control your weight, reduce stress, and improve your bone density. Over the next year, patient will increase physical activity as tolerated with a goal of at least 150 minutes of moderate physical activity per week.  You can tell that you are exercising at a moderate intensity if your heart starts beating faster and you start breathing faster but can still hold a conversation. Moderate-intensity exercise ideas include: Walking 1 mile (1.6 km) in about 15 minutes Biking Hiking Golfing Dancing Water aerobics Patient will verbalize understanding of everyday activities that increase physical activity by providing examples like the following: Yard work, such as: Sales promotion account executive Gardening Washing windows or floors Patient will be able to explain general safety guidelines for exercising:  Before you start a new exercise program, talk with your health care provider. Do not exercise so much that you hurt yourself, feel dizzy, or get very short of breath. Wear comfortable clothes and wear shoes with good support. Drink plenty of water while you exercise to prevent dehydration or heat stroke. Work out until your breathing and your heartbeat get faster.          This is a list of Health Maintenance Items that are overdue or due now: Screening mammography due in December  Orders/Referrals Placed Today: Orders Placed This Encounter  Procedures   Mammogram 3D SCREEN BREAST BILATERAL    Standing Status:   Future    Standing Expiration Date:  09/23/2022    Scheduling Instructions:     Please call patient to schedule. She is due after 11/07/21.    Order Specific Question:   Reason for Exam (SYMPTOM  OR DIAGNOSIS REQUIRED)    Answer:   Breast cancer screening    Order Specific Question:   Preferred imaging  location?    Answer:   [facility]    (Contact our referral department at [phone] if you have not spoken with someone about your referral appointment within the next 5 days)    Follow-up Plan Follow-up with Hali Marry, MD as planned Mammogram referral has been sent and they will call you to schedule.  Medicare wellness visit in one year.  AVS printed and given to the patient.      Health Maintenance, Female Adopting a healthy lifestyle and getting preventive care are important in promoting health and wellness. Ask your health care provider about: The right schedule for you to have regular tests and exams. Things you can do on your own to prevent diseases and keep yourself healthy. What should I know about diet, weight, and exercise? Eat a healthy diet  Eat a diet that includes plenty of vegetables, fruits, low-fat dairy products, and lean protein. Do not eat a lot of foods that are high in solid fats, added sugars, or sodium. Maintain a healthy weight Body mass index (BMI) is used to identify weight problems. It estimates body fat based on height and weight. Your health care provider can help determine your BMI and help you achieve or maintain a healthy weight. Get regular exercise Get regular exercise. This is one of the most important things you can do for your health. Most adults should: Exercise for at least 150 minutes each week. The exercise should increase your heart rate and make you sweat (moderate-intensity exercise). Do strengthening exercises at least twice a week. This is in addition to the moderate-intensity exercise. Spend less time sitting. Even light physical activity can be beneficial. Watch cholesterol and blood lipids Have your blood tested for lipids and cholesterol at 72 years of age, then have this test every 5 years. Have your cholesterol levels checked more often if: Your lipid or cholesterol levels are high. You are older than  72 years of age. You are at high risk for heart disease. What should I know about cancer screening? Depending on your health history and family history, you may need to have cancer screening at various ages. This may include screening for: Breast cancer. Cervical cancer. Colorectal cancer. Skin cancer. Lung cancer. What should I know about heart disease, diabetes, and high blood pressure? Blood pressure and heart disease High blood pressure causes heart disease and increases the risk of stroke. This is more likely to develop in people who have high blood pressure readings, are of African descent, or are overweight. Have your blood pressure checked: Every 3-5 years if you are 18-66 years of age. Every year if you are 5 years old or older. Diabetes Have regular diabetes screenings. This checks your fasting blood sugar level. Have the screening done: Once every three years after age 23 if you are at a normal weight and have a low risk for diabetes. More often and at a younger age if you are overweight or have a high risk for diabetes. What should I know about preventing infection? Hepatitis B If you have a higher risk for hepatitis B, you should be screened for this virus. Talk with your health care provider  to find out if you are at risk for hepatitis B infection. Hepatitis C Testing is recommended for: Everyone born from [birth year] through 1967. Anyone with known risk factors for hepatitis C. Sexually transmitted infections (STIs) Get screened for STIs, including gonorrhea and chlamydia, if: You are sexually active and are younger than 72 years of age. You are older than 72 years of age and your health care provider tells you that you are at risk for this type of infection. Your sexual activity has changed since you were last screened, and you are at increased risk for chlamydia or gonorrhea. Ask your health care provider if you are at risk. Ask your health care provider about whether you  are at high risk for HIV. Your health care provider may recommend a prescription medicine to help prevent HIV infection. If you choose to take medicine to prevent HIV, you should first get tested for HIV. You should then be tested every 3 months for as long as you are taking the medicine. Pregnancy If you are about to stop having your period (premenopausal) and you may become pregnant, seek counseling before you get pregnant. Take 400 to 800 micrograms (mcg) of folic acid every day if you become pregnant. Ask for birth control (contraception) if you want to prevent pregnancy. Osteoporosis and menopause Osteoporosis is a disease in which the bones lose minerals and strength with aging. This can result in bone fractures. If you are 33 years old or older, or if you are at risk for osteoporosis and fractures, ask your health care provider if you should: Be screened for bone loss. Take a calcium or vitamin D supplement to lower your risk of fractures. Be given hormone replacement therapy (HRT) to treat symptoms of menopause. Follow these instructions at home: Lifestyle Do not use any products that contain nicotine or tobacco, such as cigarettes, e-cigarettes, and chewing tobacco. If you need help quitting, ask your health care provider. Do not use street drugs. Do not share needles. Ask your health care provider for help if you need support or information about quitting drugs. Alcohol use Do not drink alcohol if: Your health care provider tells you not to drink. You are pregnant, may be pregnant, or are planning to become pregnant. If you drink alcohol: Limit how much you use to 0-1 drink a day. Limit intake if you are breastfeeding. Be aware of how much alcohol is in your drink. In the [place] one drink equals one 12 oz bottle of beer (355 mL), one 5 oz glass of wine (148 mL), or one 1 oz glass of hard liquor (44 mL). General instructions Schedule regular health, dental, and eye exams. Stay  current with your vaccines. Tell your health care provider if: You often feel depressed. You have ever been abused or do not feel safe at home. Summary Adopting a healthy lifestyle and getting preventive care are important in promoting health and wellness. Follow your health care provider's instructions about healthy diet, exercising, and getting tested or screened for diseases. Follow your health care provider's instructions on monitoring your cholesterol and blood pressure. This information is not intended to replace advice given to you by your health care provider. Make sure you discuss any questions you have with your health care provider. Document Revised: 01/16/2021 Document Reviewed: 11/01/2018 Elsevier Patient Education  2022 [employer].

## 2021-09-23 NOTE — Progress Notes (Signed)
MEDICARE ANNUAL WELLNESS VISIT  09/23/2021  Subjective:  Regina Houston is a 70 y.o. female patient of Metheney, Rene Kocher, MD who had a Medicare Annual Wellness Visit today. Regina Houston is Retired and lives alone. she has 0 children. she reports that she is socially active and does interact with friends/family regularly. she is moderately physically active and enjoys crossword puzzles and playing the piano.  Patient Care Team: Hali Marry, MD as PCP - General Darius Bump, [facility] as Pharmacist (Pharmacist)  Advanced Directives 09/23/2021 10/21/2020 09/22/2020 09/18/2019 09/12/2018 06/15/2016 03/04/2016  Does Patient Have a Medical Advance Directive? Yes Yes Yes Yes Yes No Yes  Type of Paramedic of [place];Living will [place];Living will [place];Living will;Out of facility DNR (pink MOST or yellow form) [place];Living will [place];Living will - -  Does patient want to make changes to medical advance directive? No - Patient declined No - Patient declined No - Patient declined No - Patient declined No - Patient declined - -  Copy of Jud in Chart? Yes - validated most recent copy scanned in chart (See row information) No - copy requested Yes - validated most recent copy scanned in chart (See row information) No - copy requested No - copy requested - No - copy requested  Would patient like information on creating a medical advance directive? - - - - - No - patient declined information -    Hospital Utilization Over the Past 12 Months: # of hospitalizations or ER visits: 0 # of surgeries: 0  Review of Systems    Patient reports that her overall health is better when compared to last year.  Review of Systems: History obtained from chart review and the patient  All other systems negative.  Pain Assessment Pain : 0-10 Pain Score: 3  Pain Type: Acute  pain Pain Location: Back Pain Orientation: Lower Pain Descriptors / Indicators: Sore Pain Onset: Yesterday Pain Frequency: Intermittent Pain Relieving Factors: Tramadol  Pain Relieving Factors: Tramadol  Current Medications & Allergies (verified) Allergies as of 09/23/2021       Reactions   Latex Rash   Tape Rash        Medication List        Accurate as of September 23, 2021  9:28 AM. If you have any questions, ask your nurse or doctor.          AMBULATORY NON FORMULARY MEDICATION Medication Name: Glucometer.  STrips and lancets to test twice daily.   Dx 250.00.   Apple Cider Vinegar Diet Tabs Take 1 tablet by mouth daily.   calcium carbonate 600 MG Tabs tablet Commonly known as: OS-CAL Take 600 mg by mouth daily.   celecoxib 200 MG capsule Commonly known as: CELEBREX TAKE 1 TO 2 CAPSULES BY MOUTH ONCE DAILY AS NEEDED FOR PAIN   fish oil-omega-3 fatty acids 1000 MG capsule Take 2 g by mouth daily.   furosemide 20 MG tablet Commonly known as: LASIX Take 1 tablet by mouth once daily   levothyroxine 100 MCG tablet Commonly known as: SYNTHROID Take 1 tablet by mouth once daily   lisinopril 20 MG tablet Commonly known as: ZESTRIL Take 1 tablet by mouth once daily   metFORMIN 500 MG tablet Commonly known as: GLUCOPHAGE TAKE 1 TABLET BY MOUTH TWICE DAILY WITH MEALS   OneTouch Ultra test strip Generic drug: glucose blood USE 1 STRIP TO CHECK GLUCOSE ONCE DAILY  simvastatin 20 MG tablet Commonly known as: ZOCOR TAKE 1 TABLET BY MOUTH AT BEDTIME   Thera Tabs Take 1 tablet by mouth daily.   traMADol 50 MG tablet Commonly known as: ULTRAM TAKE 1 TABLET BY MOUTH EVERY 8 HOURS AS NEEDED FOR SEVERE PAIN        History (reviewed): Past Medical History:  Diagnosis Date   Arthritis    Cataract    Diabetes mellitus 11/22/2005   Diverticulitis    Hyperlipidemia    Obesity    Past Surgical History:  Procedure Laterality Date   ABDOMINAL  HYSTERECTOMY  2008   fibroids   BIOPSY BREAST  2007   benign calcification   BRAIN SURGERY  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,benign menigioma   BREAST BIOPSY Left    needle core biopsy, benign   Capsulotomy MPJ Right 12/31/2016   RT FOOT, 3rd MPJ   FOOT SURGERY  2004   hammer toe   Hammer Toe Repair Right 12/31/2016   RT #3, #5 toes   Family History  Problem Relation Age of Onset   Hypertension Mother    Stroke Mother    Diabetes Mother    Heart disease Mother 75   Kidney disease Father        kidney failure   Heart disease Father 17   Heart disease Maternal Grandmother        MI   Diabetes Maternal Grandmother    Social History   Socioeconomic History   Marital status: Single    Spouse name: Not on file   Number of children: 0   Years of education: BA   Highest education level: Bachelor's degree (e.g., BA, AB, BS)  Occupational History   Occupation: retired    Comment: [employer]  Tobacco Use   Smoking status: Never   Smokeless tobacco: Never  Vaping Use   Vaping Use: Never used  Substance and Sexual Activity   Alcohol use: No   Drug use: No   Sexual activity: Not Currently  Other Topics Concern   Not on file  Social History Narrative   Lives alone with her dog. She enjoys doing crossword puzzles and playing the piano. She has a dog and walks her dog multiple times a day.   Social Determinants of Health   Financial Resource Strain: Low Risk    Difficulty of Paying Living Expenses: Not hard at all  Food Insecurity: No Food Insecurity   Worried About Charity fundraiser in the Last Year: Never true   [place] in the Last Year: Never true  Transportation Needs: No Transportation Needs   Lack of Transportation (Medical): No   Lack of Transportation (Non-Medical): No  Physical Activity: Sufficiently Active   Days of Exercise per Week: 2 days   Minutes of Exercise per Session: 120 min  Stress: No Stress Concern Present   Feeling of Stress : Not at all  Social  Connections: Moderately Isolated   Frequency of Communication with Friends and Family: Twice a week   Frequency of Social Gatherings with Friends and Family: Twice a week   Attends Religious Services: More than 4 times per year   Active Member of [employer] or Organizations: No   Attends Archivist Meetings: Never   Marital Status: Never married    Activities of Daily Living In your present state of health, do you have any difficulty performing the following activities: 09/23/2021  Hearing? Y  Comment has had a hearing exam. some  hearing loss bilaterally.  Vision? N  Difficulty concentrating or making decisions? N  Walking or climbing stairs? N  Dressing or bathing? N  Doing errands, shopping? N  Preparing Food and eating ? N  Using the Toilet? N  In the past six months, have you accidently leaked urine? N  Do you have problems with loss of bowel control? N  Managing your Medications? N  Managing your Finances? N  Housekeeping or managing your Housekeeping? N  Some recent data might be hidden    Patient Education/Literacy How often do you need to have someone help you when you read instructions, pamphlets, or other written materials from your doctor or pharmacy?: 1 - Never What is the last grade level you completed in school?: Bachelor's degree  Exercise Current Exercise Habits: Home exercise routine, Type of exercise: stretching;walking, Time (Minutes): > 60, Frequency (Times/Week): >7, Weekly Exercise (Minutes/Week): 0, Intensity: Mild, Exercise limited by: None identified  Diet Patient reports consuming 3 meals a day and 1-2 snack(s) a day Patient reports that her primary diet is: Regular Patient reports that she does have regular access to food.   Depression Screen PHQ 2/9 Scores 09/23/2021 09/22/2020 09/22/2020 09/18/2019 05/31/2019 02/15/2019 09/12/2018  PHQ - 2 Score 0 0 0 0 1 0 0     Fall Risk Fall Risk  09/23/2021 09/22/2020 10/02/2019 09/18/2019 05/31/2019  Falls in  the past year? 1 1 0 0 0  Number falls in past yr: 1 0 0 0 0  Injury with Fall? 1 0 0 0 0  Risk for fall due to : History of fall(s);Impaired balance/gait - - - -  Risk for fall due to: Comment falls were due to her dog - - - -  Follow up Falls evaluation completed;Education provided;Falls prevention discussed Falls evaluation completed - Falls prevention discussed -     Objective:   BP (!) 125/58 (BP Location: Right Arm, Patient Position: Sitting, Cuff Size: Large)   Pulse 63   Ht 5\' 4"  (1.626 m)   Wt 249 lb 1.9 oz (113 kg)   SpO2 97%   BMI 42.76 kg/m   Last Weight  Most recent update: 09/23/2021  9:00 AM    Weight  113 kg (249 lb 1.9 oz)             Body mass index is 42.76 kg/m.  Hearing/Vision  Regina Houston did not have difficulty with hearing/understanding during the face-to-face interview Regina Houston did not have difficulty with her vision during the face-to-face interview Reports that she has had a formal eye exam by an eye care professional within the past year Reports that she has had a formal hearing evaluation within the past year  Cognitive Function: 6CIT Screen 09/23/2021 09/22/2020 09/18/2019 09/12/2018  What Year? 0 points 0 points 0 points 0 points  What month? 0 points 0 points 0 points 0 points  What time? 0 points 0 points 0 points 0 points  Count back from 20 0 points 0 points 0 points 0 points  Months in reverse 0 points 0 points 0 points 0 points  Repeat phrase 0 points 0 points 0 points 0 points  Total Score 0 0 0 0    Normal Cognitive Function Screening: Yes (Normal:0-7, Significant for Dysfunction: >8)  Immunization & Health Maintenance Record Immunization History  Administered Date(s) Administered   DTP 11/22/2006   Fluad Quad(high Dose 65+) 07/25/2019, 08/20/2020   Influenza Split 10/10/2012   Influenza Whole 11/22/2006, 10/01/2009, 08/22/2010   Influenza, High  Dose Seasonal PF 09/02/2018, 08/14/2021   Influenza,inj,Quad PF,6+ Mos 08/28/2014,  08/26/2015, 08/10/2016   Influenza-Unspecified 10/01/2013, 09/02/2017, 09/02/2018   Moderna SARS-COV2 Booster Vaccination 04/28/2021   Moderna Sars-Covid-2 Vaccination 12/18/2019, 01/18/2020, 09/13/2020   Pfizer Covid-19 Vaccine Bivalent Booster 55yrs & up 08/14/2021   Pneumococcal Conjugate-13 02/19/2014   Pneumococcal Polysaccharide-23 11/22/2005, 04/03/2015   Td 11/22/2006   Tdap 02/22/2017   Zoster Recombinat (Shingrix) 01/23/2019, 03/20/2019   Zoster, Live 05/04/2011    Health Maintenance  Topic Date Due   HEMOGLOBIN A1C  12/16/2021   FOOT EXAM  06/15/2022   OPHTHALMOLOGY EXAM  09/03/2022   MAMMOGRAM  11/05/2022   DEXA SCAN  07/24/2024   COLONOSCOPY (Pts 45-20yrs Insurance coverage will need to be confirmed)  09/30/2024   TETANUS/TDAP  02/23/2027   Pneumonia Vaccine 47+ Years old  Completed   INFLUENZA VACCINE  Completed   COVID-19 Vaccine  Completed   Hepatitis C Screening  Completed   Zoster Vaccines- Shingrix  Completed   HPV VACCINES  Aged Out       Assessment  This is a routine wellness examination for Regina Houston.  Health Maintenance: Due or Overdue There are no preventive care reminders to display for this patient.  Regina Houston does not need a referral for Community Assistance: Care Management:   no Social Work:    no Prescription Assistance:  no Nutrition/Diabetes Education:  no   Plan:  Personalized Goals  Goals Addressed               This Visit's Progress     Patient Stated (pt-stated)        09/23/2021 AWV Goal: Exercise for General Health  Patient will verbalize understanding of the benefits of increased physical activity: Exercising regularly is important. It will improve your overall fitness, flexibility, and endurance. Regular exercise also will improve your overall health. It can help you control your weight, reduce stress, and improve your bone density. Over the next year, patient will increase physical activity as tolerated with a  goal of at least 150 minutes of moderate physical activity per week.  You can tell that you are exercising at a moderate intensity if your heart starts beating faster and you start breathing faster but can still hold a conversation. Moderate-intensity exercise ideas include: Walking 1 mile (1.6 km) in about 15 minutes Biking Hiking Golfing Dancing Water aerobics Patient will verbalize understanding of everyday activities that increase physical activity by providing examples like the following: Yard work, such as: Sales promotion account executive Gardening Washing windows or floors Patient will be able to explain general safety guidelines for exercising:  Before you start a new exercise program, talk with your health care provider. Do not exercise so much that you hurt yourself, feel dizzy, or get very short of breath. Wear comfortable clothes and wear shoes with good support. Drink plenty of water while you exercise to prevent dehydration or heat stroke. Work out until your breathing and your heartbeat get faster.        Personalized Health Maintenance & Screening Recommendations  Screening mammography due in December  Lung Cancer Screening Recommended: no (Low Dose CT Chest recommended if Age 87-80 years, 30 pack-year currently smoking OR have quit w/in past 15 years) Hepatitis C Screening recommended: no HIV Screening recommended: no  Advanced Directives: Written information was not given per the patient's request.  Referrals & Orders Orders Placed This Encounter  Procedures  Mammogram 3D SCREEN BREAST BILATERAL     Follow-up Plan Follow-up with Hali Marry, MD as planned Mammogram referral has been sent and they will call you to schedule.  Medicare wellness visit in one year.  AVS printed and given to the patient.   I have personally reviewed and noted the following in the patient's  chart:   Medical and social history Use of alcohol, tobacco or illicit drugs  Current medications and supplements Functional ability and status Nutritional status Physical activity Advanced directives List of other physicians Hospitalizations, surgeries, and ER visits in previous 12 months Vitals Screenings to include cognitive, depression, and falls Referrals and appointments  In addition, I have reviewed and discussed with patient certain preventive protocols, quality metrics, and best practice recommendations. A written personalized care plan for preventive services as well as general preventive health recommendations were provided to patient.     Tinnie Gens, RN  09/23/2021

## 2021-09-29 ENCOUNTER — Other Ambulatory Visit: Payer: Self-pay | Admitting: Family Medicine

## 2021-09-29 DIAGNOSIS — E785 Hyperlipidemia, unspecified: Secondary | ICD-10-CM

## 2021-09-29 DIAGNOSIS — M47816 Spondylosis without myelopathy or radiculopathy, lumbar region: Secondary | ICD-10-CM

## 2021-10-19 ENCOUNTER — Other Ambulatory Visit: Payer: Self-pay

## 2021-10-19 ENCOUNTER — Encounter: Payer: Self-pay | Admitting: Family Medicine

## 2021-10-19 ENCOUNTER — Ambulatory Visit (INDEPENDENT_AMBULATORY_CARE_PROVIDER_SITE_OTHER): Payer: Medicare Other | Admitting: Family Medicine

## 2021-10-19 VITALS — BP 126/66 | HR 84 | Ht 64.0 in | Wt 245.0 lb

## 2021-10-19 DIAGNOSIS — N1831 Chronic kidney disease, stage 3a: Secondary | ICD-10-CM | POA: Diagnosis not present

## 2021-10-19 DIAGNOSIS — E119 Type 2 diabetes mellitus without complications: Secondary | ICD-10-CM

## 2021-10-19 DIAGNOSIS — R42 Dizziness and giddiness: Secondary | ICD-10-CM | POA: Diagnosis not present

## 2021-10-19 DIAGNOSIS — I10 Essential (primary) hypertension: Secondary | ICD-10-CM | POA: Diagnosis not present

## 2021-10-19 LAB — POCT GLYCOSYLATED HEMOGLOBIN (HGB A1C): Hemoglobin A1C: 6.2 % — AB (ref 4.0–5.6)

## 2021-10-19 NOTE — Progress Notes (Signed)
Pt is taking Metformin 500 mg QD.  She reports that she gets dizzy when she wakes up in the morning. The episodes last for a min or two and goes away.

## 2021-10-19 NOTE — Progress Notes (Signed)
Established Patient Office Visit  Subjective:  Patient ID: Regina Houston, female    DOB: Jan 19, 1951  Age: 70 y.o. MRN: [id number]  CC:  Chief Complaint  Patient presents with   Diabetes    HPI Neaveh Belanger presents for   Hypertension- Pt denies chest pain, SOB, dizziness, or heart palpitations.  Taking meds as directed w/o problems.  Denies medication side effects.    Diabetes - no hypoglycemic events. No wounds or sores that are not healing well. No increased thirst or urination. Checking glucose at home. Taking medications as prescribed without any side effects.  Does taking 1 tab of metformin daily she has been walking pretty regularly with her dog Oreo.   She has had some difficulty with waking up around 2 or 3 AM feeling dizzy.  And sometimes first waking up in the morning and feeling a little dizzy almost like things are off balance she does not feel like she is going to pass out or blackout.  It usually just last for seconds to maybe a couple of minutes.  She denies any recent illnesses ear pain or pressure.  No unusual headaches.  Past Medical History:  Diagnosis Date   Arthritis    Cataract    Diabetes mellitus 11/22/2005   Diverticulitis    Hyperlipidemia    Obesity     Past Surgical History:  Procedure Laterality Date   ABDOMINAL HYSTERECTOMY  2008   fibroids   BIOPSY BREAST  2007   benign calcification   BRAIN SURGERY  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,benign menigioma   BREAST BIOPSY Left    needle core biopsy, benign   Capsulotomy MPJ Right 12/31/2016   RT FOOT, 3rd MPJ   FOOT SURGERY  2004   hammer toe   Hammer Toe Repair Right 12/31/2016   RT #3, #5 toes    Family History  Problem Relation Age of Onset   Hypertension Mother    Stroke Mother    Diabetes Mother    Heart disease Mother 24   Kidney disease Father        kidney failure   Heart disease Father 54   Heart disease Maternal Grandmother        MI   Diabetes Maternal Grandmother     Social History    Socioeconomic History   Marital status: Single    Spouse name: Not on file   Number of children: 0   Years of education: BA   Highest education level: Bachelor's degree (e.g., BA, AB, BS)  Occupational History   Occupation: retired    Comment: [employer]  Tobacco Use   Smoking status: Never   Smokeless tobacco: Never  Vaping Use   Vaping Use: Never used  Substance and Sexual Activity   Alcohol use: No   Drug use: No   Sexual activity: Not Currently  Other Topics Concern   Not on file  Social History Narrative   Lives alone with her dog. She enjoys doing crossword puzzles and playing the piano. She has a dog and walks her dog multiple times a day.   Social Determinants of Health   Financial Resource Strain: Low Risk    Difficulty of Paying Living Expenses: Not hard at all  Food Insecurity: No Food Insecurity   Worried About Charity fundraiser in the Last Year: Never true   [place] in the Last Year: Never true  Transportation Needs: No Transportation Needs   Lack of Transportation (Medical): No  Lack of Transportation (Non-Medical): No  Physical Activity: Sufficiently Active   Days of Exercise per Week: 2 days   Minutes of Exercise per Session: 120 min  Stress: No Stress Concern Present   Feeling of Stress : Not at all  Social Connections: Moderately Isolated   Frequency of Communication with Friends and Family: Twice a week   Frequency of Social Gatherings with Friends and Family: Twice a week   Attends Religious Services: More than 4 times per year   Active Member of [employer] or Organizations: No   Attends Music therapist: Never   Marital Status: Never married  Human resources officer Violence: Not At Risk   Fear of Current or Ex-Partner: No   Emotionally Abused: No   Physically Abused: No   Sexually Abused: No    Outpatient Medications Prior to Visit  Medication Sig Dispense Refill   AMBULATORY NON FORMULARY MEDICATION Medication Name:  Glucometer.  STrips and lancets to test twice daily.   Dx 250.00. 1 Units 2   calcium carbonate (OS-CAL) 600 MG TABS Take 600 mg by mouth daily.     celecoxib (CELEBREX) 200 MG capsule TAKE 1 TO 2 CAPSULES BY MOUTH ONCE DAILY AS NEEDED FOR PAIN 180 capsule 1   fish oil-omega-3 fatty acids 1000 MG capsule Take 2 g by mouth daily.     furosemide (LASIX) 20 MG tablet Take 1 tablet by mouth once daily 90 tablet 0   levothyroxine (SYNTHROID) 100 MCG tablet Take 1 tablet by mouth once daily 90 tablet 0   lisinopril (ZESTRIL) 20 MG tablet Take 1 tablet by mouth once daily 90 tablet 1   Misc Natural Products (APPLE CIDER VINEGAR DIET) TABS Take 1 tablet by mouth daily.     Multiple Vitamin (THERA) TABS Take 1 tablet by mouth daily.     ONETOUCH ULTRA test strip USE 1 STRIP TO CHECK GLUCOSE ONCE DAILY 50 each 12   simvastatin (ZOCOR) 20 MG tablet TAKE 1 TABLET BY MOUTH AT BEDTIME 90 tablet 0   traMADol (ULTRAM) 50 MG tablet TAKE 1 TABLET BY MOUTH EVERY 8 HOURS AS NEEDED FOR SEVERE PAIN 90 tablet 1   metFORMIN (GLUCOPHAGE) 500 MG tablet TAKE 1 TABLET BY MOUTH TWICE DAILY WITH MEALS (Patient taking differently: Take 500 mg by mouth daily with breakfast.) 180 tablet 0   No facility-administered medications prior to visit.    Allergies  Allergen Reactions   Latex Rash   Tape Rash    ROS Review of Systems    Objective:    Physical Exam Constitutional:      Appearance: Normal appearance. She is well-developed.  HENT:     Head: Normocephalic and atraumatic.     Right Ear: Tympanic membrane, ear canal and external ear normal.     Left Ear: Tympanic membrane, ear canal and external ear normal.  Cardiovascular:     Rate and Rhythm: Normal rate and regular rhythm.     Heart sounds: Normal heart sounds.  Pulmonary:     Effort: Pulmonary effort is normal.     Breath sounds: Normal breath sounds.  Skin:    General: Skin is warm and dry.  Neurological:     Mental Status: She is alert and  oriented to person, place, and time.  Psychiatric:        Behavior: Behavior normal.    Ht _0  (1.626 m)   BMI 42.76 kg/m  Wt Readings from Last 3 Encounters:  09/23/21 249 lb 1.9  oz (113 kg)  06/15/21 252 lb (114.3 kg)  02/11/21 269 lb (122 kg)     There are no preventive care reminders to display for this patient.  There are no preventive care reminders to display for this patient.  Lab Results  Component Value Date   TSH 1.08 06/15/2021   Lab Results  Component Value Date   WBC 6.5 04/03/2015   HGB 13.5 04/03/2015   HCT 40.3 04/03/2015   MCV 89.4 04/03/2015   PLT 345 04/03/2015   Lab Results  Component Value Date   NA 141 06/15/2021   K 4.6 06/15/2021   CO2 30 06/15/2021   GLUCOSE 95 06/15/2021   BUN 28 (H) 06/15/2021   CREATININE 0.92 06/15/2021   BILITOT 0.5 06/15/2021   ALKPHOS 97 06/14/2017   AST 18 06/15/2021   ALT 12 06/15/2021   PROT 6.5 06/15/2021   ALBUMIN 4.2 06/14/2017   CALCIUM 10.4 06/15/2021   EGFR 66 06/15/2021   Lab Results  Component Value Date   CHOL 194 06/15/2021   Lab Results  Component Value Date   HDL 77 06/15/2021   Lab Results  Component Value Date   LDLCALC 100 (H) 06/15/2021   Lab Results  Component Value Date   TRIG 82 06/15/2021   Lab Results  Component Value Date   CHOLHDL 2.5 06/15/2021   Lab Results  Component Value Date   HGBA1C 6.2 (A) 10/19/2021      Assessment & Plan:   Problem List Items Addressed This Visit       Cardiovascular and Mediastinum   Essential hypertension    Well controlled.  Continue current regimen.        Endocrine   Diabetes mellitus ([place]) - Primary    A1c looks great at 6.2.  Up a little bit from her previous of 5.7 but overall I think she is doing fantastic we will continue with current regimen with metformin and simvastatin.  Call if any problems or concerns keep up the walking.  Continue daily ACE inhibitor.      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  Last serum creatinine 0.9.  Continue daily ACE inhibitor.      Other Visit Diagnoses     Vertigo           Vertigo-it sounds like benign positional vertigo.  No ear pain or problems or fluid on exam today.  I think she would benefit from the exercises for vertigo or even vestibular rehab if needed.  Call if not improving in the next couple of weeks.  Meds ordered this encounter  Medications   metFORMIN (GLUCOPHAGE) 500 MG tablet    Sig: Take 1 tablet (500 mg total) by mouth daily with breakfast.    Dispense:  90 tablet    Refill:  1     Follow-up: Return in about 3 months (around 01/19/2022) for Diabetes follow-up and labwork .    [name], MD

## 2021-10-20 MED ORDER — METFORMIN HCL 500 MG PO TABS: 500.0000 mg | ORAL_TABLET | Freq: Every day | ORAL | 1 refills | Status: DC

## 2021-10-20 NOTE — Assessment & Plan Note (Signed)
Well-controlled.  Continue current regimen. 

## 2021-10-20 NOTE — Assessment & Plan Note (Signed)
Continue to monitor renal function due for repeat in January.  Last serum creatinine 0.9.  Continue daily ACE inhibitor.

## 2021-10-20 NOTE — Assessment & Plan Note (Signed)
A1c looks great at 6.2.  Up a little bit from her previous of 5.7 but overall I think she is doing fantastic we will continue with current regimen with metformin and simvastatin.  Call if any problems or concerns keep up the walking.  Continue daily ACE inhibitor.

## 2021-11-02 ENCOUNTER — Other Ambulatory Visit: Payer: Self-pay | Admitting: Family Medicine

## 2021-11-11 ENCOUNTER — Ambulatory Visit (INDEPENDENT_AMBULATORY_CARE_PROVIDER_SITE_OTHER): Payer: Medicare Other

## 2021-11-11 ENCOUNTER — Other Ambulatory Visit: Payer: Self-pay

## 2021-11-11 DIAGNOSIS — Z1231 Encounter for screening mammogram for malignant neoplasm of breast: Secondary | ICD-10-CM

## 2021-11-11 DIAGNOSIS — Z0000 Encounter for general adult medical examination without abnormal findings: Secondary | ICD-10-CM

## 2021-11-11 NOTE — Progress Notes (Signed)
Please call patient. Normal mammogram.  Repeat in 1 year.  

## 2021-11-16 ENCOUNTER — Other Ambulatory Visit: Payer: Self-pay | Admitting: Family Medicine

## 2021-11-16 DIAGNOSIS — M47816 Spondylosis without myelopathy or radiculopathy, lumbar region: Secondary | ICD-10-CM

## 2021-12-14 ENCOUNTER — Other Ambulatory Visit: Payer: Self-pay | Admitting: Family Medicine

## 2021-12-14 DIAGNOSIS — E039 Hypothyroidism, unspecified: Secondary | ICD-10-CM

## 2021-12-28 ENCOUNTER — Other Ambulatory Visit: Payer: Self-pay | Admitting: Family Medicine

## 2021-12-28 DIAGNOSIS — M47816 Spondylosis without myelopathy or radiculopathy, lumbar region: Secondary | ICD-10-CM

## 2021-12-28 DIAGNOSIS — E785 Hyperlipidemia, unspecified: Secondary | ICD-10-CM

## 2022-01-19 ENCOUNTER — Encounter: Payer: Self-pay | Admitting: Family Medicine

## 2022-01-19 ENCOUNTER — Other Ambulatory Visit: Payer: Self-pay

## 2022-01-19 ENCOUNTER — Ambulatory Visit (INDEPENDENT_AMBULATORY_CARE_PROVIDER_SITE_OTHER): Payer: Medicare Other | Admitting: Family Medicine

## 2022-01-19 VITALS — BP 120/50 | HR 73 | Resp 18 | Ht 64.0 in | Wt 247.0 lb

## 2022-01-19 DIAGNOSIS — I10 Essential (primary) hypertension: Secondary | ICD-10-CM

## 2022-01-19 DIAGNOSIS — D229 Melanocytic nevi, unspecified: Secondary | ICD-10-CM

## 2022-01-19 DIAGNOSIS — E119 Type 2 diabetes mellitus without complications: Secondary | ICD-10-CM

## 2022-01-19 DIAGNOSIS — E039 Hypothyroidism, unspecified: Secondary | ICD-10-CM

## 2022-01-19 DIAGNOSIS — N1831 Chronic kidney disease, stage 3a: Secondary | ICD-10-CM | POA: Diagnosis not present

## 2022-01-19 MED ORDER — LEVOTHYROXINE SODIUM 100 MCG PO TABS: 100.0000 ug | ORAL_TABLET | Freq: Every day | ORAL | 3 refills | Status: DC

## 2022-01-19 NOTE — Assessment & Plan Note (Signed)
Recheck potassium and renal function

## 2022-01-19 NOTE — Assessment & Plan Note (Signed)
Plan to recheck TSH today.  I did go ahead and send over refills we can make adjustments if needed.

## 2022-01-19 NOTE — Assessment & Plan Note (Signed)
Pressure looks absolutely fantastic today.  Continue current regimen.

## 2022-01-19 NOTE — Progress Notes (Signed)
Established Patient Office Visit  Subjective:  Patient ID: Regina Houston, female    DOB: Feb 12, 1951  Age: 71 y.o. MRN: [id number]  CC:  Chief Complaint  Patient presents with   Diabetes    Follow up    Nevus    All over discuss referral to Dermatology    Labs Only    TSH    HPI Regina Houston presents for   Diabetes - no hypoglycemic events. No wounds or sores that are not healing well. No increased thirst or urination. Checking glucose at home. Taking medications as prescribed without any side effects.  She would like referral to dermatology just for full body skin check she is has a lot of moles.  None in particular that she is very concerned about but should but would like to have them evaluated.  Hypothyroidism -. No recent change to skin, hair, or energy levels.   Past Medical History:  Diagnosis Date   Arthritis    Cataract    Diabetes mellitus 11/22/2005   Diverticulitis    Hyperlipidemia    Obesity     Past Surgical History:  Procedure Laterality Date   ABDOMINAL HYSTERECTOMY  2008   fibroids   BIOPSY BREAST Left 2007   benign calcification   BRAIN SURGERY  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,benign menigioma   Capsulotomy MPJ Right 12/31/2016   RT FOOT, 3rd MPJ   FOOT SURGERY  2004   hammer toe   Hammer Toe Repair Right 12/31/2016   RT #3, #5 toes    Family History  Problem Relation Age of Onset   Hypertension Mother    Stroke Mother    Diabetes Mother    Heart disease Mother 81   Kidney disease Father        kidney failure   Heart disease Father 40   Heart disease Maternal Grandmother        MI   Diabetes Maternal Grandmother     Social History   Socioeconomic History   Marital status: Single    Spouse name: Not on file   Number of children: 0   Years of education: BA   Highest education level: Bachelor's degree (e.g., BA, AB, BS)  Occupational History   Occupation: retired    Comment: [employer]  Tobacco Use   Smoking status: Never   Smokeless  tobacco: Never  Vaping Use   Vaping Use: Never used  Substance and Sexual Activity   Alcohol use: No   Drug use: No   Sexual activity: Not Currently  Other Topics Concern   Not on file  Social History Narrative   Lives alone with her dog. She enjoys doing crossword puzzles and playing the piano. She has a dog and walks her dog multiple times a day.   Social Determinants of Health   Financial Resource Strain: Low Risk    Difficulty of Paying Living Expenses: Not hard at all  Food Insecurity: No Food Insecurity   Worried About Charity fundraiser in the Last Year: Never true   [place] in the Last Year: Never true  Transportation Needs: No Transportation Needs   Lack of Transportation (Medical): No   Lack of Transportation (Non-Medical): No  Physical Activity: Sufficiently Active   Days of Exercise per Week: 2 days   Minutes of Exercise per Session: 120 min  Stress: No Stress Concern Present   Feeling of Stress : Not at all  Social Connections: Moderately Isolated   Frequency  of Communication with Friends and Family: Twice a week   Frequency of Social Gatherings with Friends and Family: Twice a week   Attends Religious Services: More than 4 times per year   Active Member of [employer] or Organizations: No   Attends Music therapist: Never   Marital Status: Never married  Human resources officer Violence: Not At Risk   Fear of Current or Ex-Partner: No   Emotionally Abused: No   Physically Abused: No   Sexually Abused: No    Outpatient Medications Prior to Visit  Medication Sig Dispense Refill   AMBULATORY NON FORMULARY MEDICATION Medication Name: Glucometer.  STrips and lancets to test twice daily.   Dx 250.00. 1 Units 2   calcium carbonate (OS-CAL) 600 MG TABS Take 600 mg by mouth daily.     celecoxib (CELEBREX) 200 MG capsule TAKE 1 TO 2 CAPSULES BY MOUTH ONCE DAILY AS NEEDED FOR PAIN 180 capsule 0   fish oil-omega-3 fatty acids 1000 MG capsule Take 2 g by mouth  daily.     furosemide (LASIX) 20 MG tablet Take 1 tablet by mouth once daily 90 tablet 0   lisinopril (ZESTRIL) 20 MG tablet Take 1 tablet by mouth once daily 90 tablet 1   metFORMIN (GLUCOPHAGE) 500 MG tablet Take 1 tablet (500 mg total) by mouth daily with breakfast. 90 tablet 1   Misc Natural Products (APPLE CIDER VINEGAR DIET) TABS Take 1 tablet by mouth daily.     Multiple Vitamin (THERA) TABS Take 1 tablet by mouth daily.     ONETOUCH ULTRA test strip USE 1 STRIP TO CHECK GLUCOSE ONCE DAILY 50 each 12   simvastatin (ZOCOR) 20 MG tablet TAKE 1 TABLET BY MOUTH AT BEDTIME 90 tablet 0   traMADol (ULTRAM) 50 MG tablet TAKE 1 TABLET BY MOUTH EVERY 8 HOURS AS NEEDED FOR SEVERE PAIN 90 tablet 0   levothyroxine (SYNTHROID) 100 MCG tablet Take 1 tablet (100 mcg total) by mouth daily. NO REFILLS. NEEDS A VISIT/LABS W/PCP 30 tablet 0   No facility-administered medications prior to visit.    Allergies  Allergen Reactions   Latex Rash   Tape Rash    ROS Review of Systems    Objective:    Physical Exam Constitutional:      Appearance: Normal appearance. She is well-developed.  HENT:     Head: Normocephalic and atraumatic.  Cardiovascular:     Rate and Rhythm: Normal rate and regular rhythm.     Heart sounds: Normal heart sounds.  Pulmonary:     Effort: Pulmonary effort is normal.     Breath sounds: Normal breath sounds.  Skin:    General: Skin is warm and dry.  Neurological:     Mental Status: She is alert and oriented to person, place, and time.  Psychiatric:        Behavior: Behavior normal.    BP (!) 120/50    Pulse 73    Resp 18    Ht 5' 4"  (1.626 m)    Wt 247 lb (112 kg)    SpO2 99%    BMI 42.40 kg/m  Wt Readings from Last 3 Encounters:  01/19/22 247 lb (112 kg)  10/19/21 245 lb (111.1 kg)  09/23/21 249 lb 1.9 oz (113 kg)     There are no preventive care reminders to display for this patient.  There are no preventive care reminders to display for this  patient.  Lab Results  Component Value Date  TSH 1.08 06/15/2021   Lab Results  Component Value Date   WBC 6.5 04/03/2015   HGB 13.5 04/03/2015   HCT 40.3 04/03/2015   MCV 89.4 04/03/2015   PLT 345 04/03/2015   Lab Results  Component Value Date   NA 141 06/15/2021   K 4.6 06/15/2021   CO2 30 06/15/2021   GLUCOSE 95 06/15/2021   BUN 28 (H) 06/15/2021   CREATININE 0.92 06/15/2021   BILITOT 0.5 06/15/2021   ALKPHOS 97 06/14/2017   AST 18 06/15/2021   ALT 12 06/15/2021   PROT 6.5 06/15/2021   ALBUMIN 4.2 06/14/2017   CALCIUM 10.4 06/15/2021   EGFR 66 06/15/2021   Lab Results  Component Value Date   CHOL 194 06/15/2021   Lab Results  Component Value Date   HDL 77 06/15/2021   Lab Results  Component Value Date   LDLCALC 100 (H) 06/15/2021   Lab Results  Component Value Date   TRIG 82 06/15/2021   Lab Results  Component Value Date   CHOLHDL 2.5 06/15/2021   Lab Results  Component Value Date   HGBA1C 6.2 (A) 10/19/2021      Assessment & Plan:   Problem List Items Addressed This Visit       Cardiovascular and Mediastinum   Essential hypertension    Pressure looks absolutely fantastic today.  Continue current regimen.      Relevant Orders   HgB U8H   BASIC METABOLIC PANEL WITH GFR   TSH     Endocrine   Hypothyroidism    Plan to recheck TSH today.  I did go ahead and send over refills we can make adjustments if needed.      Relevant Medications   levothyroxine (SYNTHROID) 100 MCG tablet   Other Relevant Orders   TSH   Diabetes mellitus ([place])   Relevant Orders   HgB F2B   BASIC METABOLIC PANEL WITH GFR     Genitourinary   CKD stage G3a/A1, GFR 45-59 and albumin creatinine ratio <30 mg/g (HCC)    Recheck potassium and renal function      Other Visit Diagnoses     Numerous moles    -  Primary   Relevant Orders   Ambulatory referral to Dermatology       Dermatology referral placed for general skin check.  Meds ordered this  encounter  Medications   levothyroxine (SYNTHROID) 100 MCG tablet    Sig: Take 1 tablet (100 mcg total) by mouth daily.    Dispense:  90 tablet    Refill:  3    Follow-up: Return in about 4 months (around 05/24/2022) for Diabetes follow-up.    [name], MD

## 2022-01-20 LAB — BASIC METABOLIC PANEL WITH GFR
BUN: 20 mg/dL (ref 7–25)
CO2: 31 mmol/L (ref 20–32)
Calcium: 10.2 mg/dL (ref 8.6–10.4)
Chloride: 104 mmol/L (ref 98–110)
Creat: 0.94 mg/dL (ref 0.60–1.00)
Glucose, Bld: 100 mg/dL — ABNORMAL HIGH (ref 65–99)
Potassium: 4.9 mmol/L (ref 3.5–5.3)
Sodium: 142 mmol/L (ref 135–146)
eGFR: 64 mL/min/{1.73_m2} (ref >=60)

## 2022-01-20 LAB — HEMOGLOBIN A1C
Hgb A1c MFr Bld: 5.9 % of total Hgb — ABNORMAL HIGH (ref <5.7)
Mean Plasma Glucose: 123 mg/dL
eAG (mmol/L): 6.8 mmol/L

## 2022-01-20 LAB — TSH: TSH: 1.34 mIU/L (ref 0.40–4.50)

## 2022-01-20 NOTE — Progress Notes (Signed)
Hi Regina Houston, your A1c actually looks better this time down to 5.9.  Great work!  Your metabolic panel looks great as well.  Area looks great at 1.3.

## 2022-01-21 ENCOUNTER — Encounter: Payer: Self-pay | Admitting: Family Medicine

## 2022-02-09 ENCOUNTER — Other Ambulatory Visit: Payer: Self-pay | Admitting: Family Medicine

## 2022-02-09 DIAGNOSIS — M47816 Spondylosis without myelopathy or radiculopathy, lumbar region: Secondary | ICD-10-CM

## 2022-02-15 DIAGNOSIS — L820 Inflamed seborrheic keratosis: Secondary | ICD-10-CM | POA: Diagnosis not present

## 2022-02-15 DIAGNOSIS — L578 Other skin changes due to chronic exposure to nonionizing radiation: Secondary | ICD-10-CM | POA: Diagnosis not present

## 2022-02-15 DIAGNOSIS — L814 Other melanin hyperpigmentation: Secondary | ICD-10-CM | POA: Diagnosis not present

## 2022-02-15 DIAGNOSIS — X32XXXS Exposure to sunlight, sequela: Secondary | ICD-10-CM | POA: Diagnosis not present

## 2022-02-15 DIAGNOSIS — D485 Neoplasm of uncertain behavior of skin: Secondary | ICD-10-CM | POA: Diagnosis not present

## 2022-02-15 DIAGNOSIS — L570 Actinic keratosis: Secondary | ICD-10-CM | POA: Diagnosis not present

## 2022-02-15 DIAGNOSIS — L821 Other seborrheic keratosis: Secondary | ICD-10-CM | POA: Diagnosis not present

## 2022-02-16 ENCOUNTER — Other Ambulatory Visit: Payer: Self-pay | Admitting: Family Medicine

## 2022-03-30 ENCOUNTER — Other Ambulatory Visit: Payer: Self-pay | Admitting: Family Medicine

## 2022-03-30 DIAGNOSIS — M47816 Spondylosis without myelopathy or radiculopathy, lumbar region: Secondary | ICD-10-CM

## 2022-03-30 DIAGNOSIS — E785 Hyperlipidemia, unspecified: Secondary | ICD-10-CM

## 2022-05-01 ENCOUNTER — Other Ambulatory Visit: Payer: Self-pay | Admitting: Family Medicine

## 2022-05-06 ENCOUNTER — Telehealth: Payer: Self-pay | Admitting: *Deleted

## 2022-05-06 NOTE — Telephone Encounter (Signed)
Pt called and stated that she was having pain soreness in her L knee and this is causing her problems with walking.   Pt was informed that she had seen Dr. Darene Lamer for this and that she can schedule an appointment with him for this. She was agreeable to this. Pt transferred to scheduler to make appt.

## 2022-05-10 ENCOUNTER — Other Ambulatory Visit: Payer: Self-pay | Admitting: Family Medicine

## 2022-05-13 ENCOUNTER — Telehealth: Payer: Medicare Other

## 2022-05-18 ENCOUNTER — Encounter: Payer: Self-pay | Admitting: Family Medicine

## 2022-05-18 ENCOUNTER — Ambulatory Visit (INDEPENDENT_AMBULATORY_CARE_PROVIDER_SITE_OTHER): Payer: Medicare Other | Admitting: Family Medicine

## 2022-05-18 VITALS — BP 131/52 | HR 66 | Resp 18 | Ht 64.0 in | Wt 252.0 lb

## 2022-05-18 DIAGNOSIS — M47816 Spondylosis without myelopathy or radiculopathy, lumbar region: Secondary | ICD-10-CM

## 2022-05-18 DIAGNOSIS — E119 Type 2 diabetes mellitus without complications: Secondary | ICD-10-CM

## 2022-05-18 DIAGNOSIS — I10 Essential (primary) hypertension: Secondary | ICD-10-CM | POA: Diagnosis not present

## 2022-05-18 LAB — POCT GLYCOSYLATED HEMOGLOBIN (HGB A1C): Hemoglobin A1C: 5.9 % — AB (ref 4.0–5.6)

## 2022-05-18 LAB — POCT UA - MICROALBUMIN

## 2022-05-18 MED ORDER — TRAMADOL HCL 50 MG PO TABS: ORAL_TABLET | ORAL | 0 refills | Status: DC

## 2022-05-18 MED ORDER — CELECOXIB 200 MG PO CAPS: ORAL_CAPSULE | ORAL | 1 refills | Status: DC

## 2022-05-18 MED ORDER — METFORMIN HCL 500 MG PO TABS: 500.0000 mg | ORAL_TABLET | Freq: Every day | ORAL | 3 refills | Status: DC

## 2022-05-18 NOTE — Assessment & Plan Note (Signed)
Well controlled. Continue current regimen. Follow up in  4 mo 

## 2022-05-18 NOTE — Progress Notes (Signed)
Established Patient Office Visit  Subjective   Patient ID: Regina Houston, female    DOB: [birth year]  Age: 71 y.o. MRN: [id number]  Chief Complaint  Patient presents with   Diabetes    Follow up     HPI  Hypertension- Pt denies chest pain, SOB, dizziness, or heart palpitations.  Taking meds as directed w/o problems.  Denies medication side effects.    Diabetes - no hypoglycemic events. No wounds or sores that are not healing well. No increased thirst or urination. Checking glucose at home. Taking medications as prescribed without any side effects.  He is also been having a lot of problems with her left hip and left knee.  She says it is definitely been worse over the last 2 to 3 weeks.  She knows she has some significant arthritis in that left knee in fact she had viscosupplementation injected probably about 5 or 6 years ago.  She has an appointment with one of our sports med docs tomorrow for that.  It is kind of limited her activity recently and so she has gained back a little bit of weight.  But she is otherwise doing well.    ROS    Objective:     BP (!) 131/52   Pulse 66   Resp 18   Ht 5\' 4"  (1.626 m)   Wt 252 lb (114.3 kg)   SpO2 96%   BMI 43.26 kg/m    Physical Exam Vitals and nursing note reviewed.  Constitutional:      Appearance: She is well-developed.  HENT:     Head: Normocephalic and atraumatic.  Cardiovascular:     Rate and Rhythm: Normal rate and regular rhythm.     Heart sounds: Normal heart sounds.  Pulmonary:     Effort: Pulmonary effort is normal.     Breath sounds: Normal breath sounds.  Skin:    General: Skin is warm and dry.  Neurological:     Mental Status: She is alert and oriented to person, place, and time.  Psychiatric:        Behavior: Behavior normal.      Results for orders placed or performed in visit on 05/18/22  POCT HgB A1C  Result Value Ref Range   Hemoglobin A1C 5.9 (A) 4.0 - 5.6 %   HbA1c POC (<> result, manual entry)      HbA1c, POC (prediabetic range)     HbA1c, POC (controlled diabetic range)    POCT UA - Microalbumin  Result Value Ref Range   Microalbumin Ur, POC 10mg /L mg/L   Creatinine, POC 10mg /DL mg/dL   Albumin/Creatinine Ratio, Urine, POC 30mg /g       The 10-year ASCVD risk score (Arnett DK, et al., 2019) is: 31.1%    Assessment & Plan:   Problem List Items Addressed This Visit       Cardiovascular and Mediastinum   Essential hypertension - Primary    Well controlled. Continue current regimen. Follow up in  4 mo         Endocrine   Diabetes mellitus (HCC)    Well controlled. Continue current regimen. Follow up in  4 mo       Relevant Medications   metFORMIN (GLUCOPHAGE) 500 MG tablet   Other Relevant Orders   POCT HgB A1C (Completed)   POCT UA - Microalbumin (Completed)     Musculoskeletal and Integument   Lumbar spondylosis    We will go ahead and refill NSAID.  Continue to  monitor renal function every 6 months.  Most days she just takes Celebrex once daily.      Relevant Medications   celecoxib (CELEBREX) 200 MG capsule   traMADol (ULTRAM) 50 MG tablet   Left hip and left knee pain-has an appointment tomorrow with one of our sports med docs for further work-up.  Return in about 4 months (around 09/17/2022) for Diabetes follow-up, Hypertension.    Nani Gasser, MD

## 2022-05-19 ENCOUNTER — Ambulatory Visit (INDEPENDENT_AMBULATORY_CARE_PROVIDER_SITE_OTHER): Payer: Medicare Other

## 2022-05-19 ENCOUNTER — Ambulatory Visit (INDEPENDENT_AMBULATORY_CARE_PROVIDER_SITE_OTHER): Payer: Medicare Other | Admitting: Sports Medicine

## 2022-05-19 DIAGNOSIS — M47816 Spondylosis without myelopathy or radiculopathy, lumbar region: Secondary | ICD-10-CM

## 2022-05-19 DIAGNOSIS — M545 Low back pain, unspecified: Secondary | ICD-10-CM

## 2022-05-19 DIAGNOSIS — M47818 Spondylosis without myelopathy or radiculopathy, sacral and sacrococcygeal region: Secondary | ICD-10-CM | POA: Diagnosis not present

## 2022-05-19 DIAGNOSIS — M533 Sacrococcygeal disorders, not elsewhere classified: Secondary | ICD-10-CM | POA: Diagnosis not present

## 2022-05-19 NOTE — Assessment & Plan Note (Signed)
Very pleasant 71 year old female, multifactorial low back pain, historically she has L4-S1 lumbar spinal stenosis with fairly severe L5-S1 facet arthritis, she has had some epidurals and facet injections, on the right side a right-sided L5-S1 facet radiofrequency ablation seemed to resolve her pain. More recently she is having pain on the left side, worse with standing, walking. She localizes the pain at the left sacroiliac joint. Today we injected her left sacroiliac joint per her request. Adding SI joint conditioning, updated x-rays. Return to see [place] in 6 weeks, if insufficiently better we will proceed with an updated MRI and likely left-sided epidural.

## 2022-05-19 NOTE — Progress Notes (Signed)
    Procedures performed today:    Procedure: Real-time Ultrasound Guided injection of the left sacroiliac joint Device: Samsung HS60  Verbal informed consent obtained.  Time-out conducted.  Noted no overlying erythema, induration, or other signs of local infection.  Skin prepped in a sterile fashion.  Local anesthesia: Topical Ethyl chloride.  With sterile technique and under real time ultrasound guidance: Arthritic joint noted, 1 cc Kenalog 40, 2 cc lidocaine, 2 cc bupivacaine injected easily Completed without difficulty  Advised to call if fevers/chills, erythema, induration, drainage, or persistent bleeding.  Images permanently stored and available for review in PACS.  Impression: Technically successful ultrasound guided injection.  Independent interpretation of notes and tests performed by another provider:   None.  Brief History, Exam, Impression, and Recommendations:    Lumbar spondylosis Very pleasant 70 year old female, multifactorial low back pain, historically she has L4-S1 lumbar spinal stenosis with fairly severe L5-S1 facet arthritis, she has had some epidurals and facet injections, on the right side a right-sided L5-S1 facet radiofrequency ablation seemed to resolve her pain. More recently she is having pain on the left side, worse with standing, walking. She localizes the pain at the left sacroiliac joint. Today we injected her left sacroiliac joint per her request. Adding SI joint conditioning, updated x-rays. Return to see [place] in 6 weeks, if insufficiently better we will proceed with an updated MRI and likely left-sided epidural.    ____________________________________________ [name]. Dianah Field, M.D., ABFM., CAQSM., AME. Primary Care and Sports Medicine [place] [facility]  Adjunct Professor of [place] [facility] of Medicine  Risk manager

## 2022-05-25 ENCOUNTER — Encounter: Payer: Self-pay | Admitting: Sports Medicine

## 2022-05-27 NOTE — Telephone Encounter (Signed)
Patient has been scheduled for 7/13 with Dr Darene Lamer. Regina Houston

## 2022-06-03 ENCOUNTER — Ambulatory Visit (INDEPENDENT_AMBULATORY_CARE_PROVIDER_SITE_OTHER): Payer: Medicare Other | Admitting: Sports Medicine

## 2022-06-03 DIAGNOSIS — M47816 Spondylosis without myelopathy or radiculopathy, lumbar region: Secondary | ICD-10-CM | POA: Diagnosis not present

## 2022-06-03 MED ORDER — PREGABALIN 50 MG PO CAPS: ORAL_CAPSULE | ORAL | 3 refills | Status: DC

## 2022-06-03 NOTE — Assessment & Plan Note (Addendum)
Regina Houston returns, she is a very pleasant 71 year old female, multifactorial axial and left-sided radicular low back pain, she does have a history of L4-S1 lumbar spinal stenosis with fairly severe L5-S1 facet arthritis, she had some epidurals, she had some facet injections, approximately 8 years ago. She had a right-sided L5-S1 facet RFA that seemed to resolve some of her pain, epidurals also were effective along the way. At the last visit she was localizing pain predominately at the left sacroiliac joint, we injected this with ultrasound guidance and she did not get good relief suggesting that her pain is likely from her spinal stenosis. At this point we will proceed with MRI for interventional planning, likely a left L4-L5 interlaminar epidural. Adding Lyrica for pain relief in the meantime, she understands we will be starting low and tapering her up slowly. Tramadol does not provide much relief.

## 2022-06-03 NOTE — Progress Notes (Signed)
    Procedures performed today:    None.  Independent interpretation of notes and tests performed by another provider:   None.  Brief History, Exam, Impression, and Recommendations:    Lumbar spondylosis Regina Houston returns, she is a very pleasant 71 year old female, multifactorial axial and left-sided radicular low back pain, she does have a history of L4-S1 lumbar spinal stenosis with fairly severe L5-S1 facet arthritis, she had some epidurals, she had some facet injections, approximately 8 years ago. She had a right-sided L5-S1 facet RFA that seemed to resolve some of her pain, epidurals also were effective along the way. At the last visit she was localizing pain predominately at the left sacroiliac joint, we injected this with ultrasound guidance and she did not get good relief suggesting that her pain is likely from her spinal stenosis. At this point we will proceed with MRI for interventional planning, likely a left L4-L5 interlaminar epidural. Adding Lyrica for pain relief in the meantime, she understands we will be starting low and tapering her up slowly. Tramadol does not provide much relief.  Chronic process with exacerbation of pharmacologic intervention  ____________________________________________ [name]. Dianah Field, M.D., ABFM., CAQSM., AME. Primary Care and Sports Medicine [place] [facility]  Adjunct Professor of [place] [employer] [facility] of Medicine  Risk manager

## 2022-06-04 ENCOUNTER — Telehealth: Payer: Self-pay

## 2022-06-04 NOTE — Telephone Encounter (Signed)
Yes can continue tramadol with Lyrica as she tapers up on the dose until she finds a dose of Lyrica that makes tramadol unnecessary.

## 2022-06-04 NOTE — Telephone Encounter (Signed)
Patient reports starting the lyrica and wants to know if she is supposed to continue taking the tramadol with it.

## 2022-06-07 NOTE — Telephone Encounter (Signed)
Left msg for a return call from patient.  

## 2022-06-08 ENCOUNTER — Other Ambulatory Visit: Payer: Medicare Other

## 2022-06-08 ENCOUNTER — Ambulatory Visit (INDEPENDENT_AMBULATORY_CARE_PROVIDER_SITE_OTHER): Payer: Medicare Other

## 2022-06-08 DIAGNOSIS — M47816 Spondylosis without myelopathy or radiculopathy, lumbar region: Secondary | ICD-10-CM

## 2022-06-08 DIAGNOSIS — M48061 Spinal stenosis, lumbar region without neurogenic claudication: Secondary | ICD-10-CM | POA: Diagnosis not present

## 2022-06-08 DIAGNOSIS — M5126 Other intervertebral disc displacement, lumbar region: Secondary | ICD-10-CM | POA: Diagnosis not present

## 2022-06-08 NOTE — Telephone Encounter (Signed)
Patient aware of Dr. Mcneil Sober recommendations and verbalized understanding.

## 2022-06-16 ENCOUNTER — Telehealth: Payer: Self-pay

## 2022-06-16 DIAGNOSIS — M47816 Spondylosis without myelopathy or radiculopathy, lumbar region: Secondary | ICD-10-CM

## 2022-06-16 NOTE — Telephone Encounter (Signed)
Epidural ordered, we will proceed with facet joint injections if insufficient improvement.

## 2022-06-16 NOTE — Telephone Encounter (Signed)
Patient left msg that she would like to proceed with the epidural. Please place orders.

## 2022-06-16 NOTE — Telephone Encounter (Signed)
Left msg for Cathy at [facility] to let me know when she has this pt scheduled.   Left msg for patient to let her know to call us when she has been scheduled.

## 2022-06-18 DIAGNOSIS — H838X3 Other specified diseases of inner ear, bilateral: Secondary | ICD-10-CM | POA: Diagnosis not present

## 2022-06-18 DIAGNOSIS — H903 Sensorineural hearing loss, bilateral: Secondary | ICD-10-CM | POA: Diagnosis not present

## 2022-06-22 ENCOUNTER — Other Ambulatory Visit: Payer: Medicare Other

## 2022-06-23 ENCOUNTER — Ambulatory Visit
Admission: RE | Admit: 2022-06-23 | Discharge: 2022-06-23 | Disposition: A | Discharge status: Home / Self Care | Payer: Medicare Other | Source: Ambulatory Visit | Attending: Sports Medicine | Admitting: Sports Medicine

## 2022-06-23 DIAGNOSIS — M47816 Spondylosis without myelopathy or radiculopathy, lumbar region: Secondary | ICD-10-CM

## 2022-06-23 MED ORDER — IOPAMIDOL (ISOVUE-M 200) INJECTION 41%
1.0000 mL | Freq: Once | INTRAMUSCULAR | Status: AC
  Administered 2022-06-23: 1 mL via EPIDURAL

## 2022-06-23 MED ORDER — METHYLPREDNISOLONE ACETATE 40 MG/ML INJ SUSP (RADIOLOG
80.0000 mg | Freq: Once | INTRAMUSCULAR | Status: AC
  Administered 2022-06-23: 80 mg via EPIDURAL

## 2022-06-23 NOTE — Discharge Instructions (Signed)

## 2022-06-30 ENCOUNTER — Ambulatory Visit (INDEPENDENT_AMBULATORY_CARE_PROVIDER_SITE_OTHER): Payer: Medicare Other

## 2022-06-30 ENCOUNTER — Ambulatory Visit (INDEPENDENT_AMBULATORY_CARE_PROVIDER_SITE_OTHER): Payer: Medicare Other | Admitting: Sports Medicine

## 2022-06-30 DIAGNOSIS — M170 Bilateral primary osteoarthritis of knee: Secondary | ICD-10-CM

## 2022-06-30 DIAGNOSIS — M47816 Spondylosis without myelopathy or radiculopathy, lumbar region: Secondary | ICD-10-CM

## 2022-06-30 MED ORDER — PREGABALIN 75 MG PO CAPS: 75.0000 mg | ORAL_CAPSULE | Freq: Three times a day (TID) | ORAL | 3 refills | Status: DC

## 2022-06-30 NOTE — Progress Notes (Signed)
    Procedures performed today:    Procedure: Real-time Ultrasound Guided injection of the left kne Device: Samsung HS60  Verbal informed consent obtained.  Time-out conducted.  Noted no overlying erythema, induration, or other signs of local infection.  Skin prepped in a sterile fashion.  Local anesthesia: Topical Ethyl chloride.  With sterile technique and under real time ultrasound guidance: Effusion noted, 1 cc Kenalog 40, 2 cc lidocaine, 2 cc bupivacaine injected easily Completed without difficulty  Advised to call if fevers/chills, erythema, induration, drainage, or persistent bleeding.  Images permanently stored and available for review in PACS.  Impression: Technically successful ultrasound guided injection.  Independent interpretation of notes and tests performed by another provider:   None.  Brief History, Exam, Impression, and Recommendations:    Osteoarthritis of both knees Increasing pain left knee, she did really well after viscosupplementation back in 2015. Steroid injection today, we will add Visco if she does not get long-term relief.  Lumbar spondylosis Angelee also has multifactorial low back pain, she has left-sided radicular and axial pain, she has L4-S1 lumbar spinal stenosis and severe L5-S1 facet arthritis, she has had epidurals, facet injections, she had a right side L5-S1 facet RFA that seem to resolve some of her axial pain, she also had a left SI joint injection that did not provide relief and a left L4-L5 interlaminar epidural that provided good relief, she is interested in going up on her Lyrica but otherwise we can leave the back alone.    ____________________________________________ [name]. Dianah Field, M.D., ABFM., CAQSM., AME. Primary Care and Sports Medicine [place] [facility]  Adjunct Professor of [place] [employer] [facility] of Medicine  Risk manager

## 2022-06-30 NOTE — Assessment & Plan Note (Signed)
Increasing pain left knee, she did really well after viscosupplementation back in 2015. Steroid injection today, we will add Visco if she does not get long-term relief.

## 2022-06-30 NOTE — Assessment & Plan Note (Signed)
Regina Houston also has multifactorial low back pain, she has left-sided radicular and axial pain, she has L4-S1 lumbar spinal stenosis and severe L5-S1 facet arthritis, she has had epidurals, facet injections, she had a right side L5-S1 facet RFA that seem to resolve some of her axial pain, she also had a left SI joint injection that did not provide relief and a left L4-L5 interlaminar epidural that provided good relief, she is interested in going up on her Lyrica but otherwise we can leave the back alone.

## 2022-07-26 ENCOUNTER — Other Ambulatory Visit: Payer: Self-pay | Admitting: Family Medicine

## 2022-08-06 ENCOUNTER — Other Ambulatory Visit: Payer: Self-pay | Admitting: Family Medicine

## 2022-08-11 ENCOUNTER — Ambulatory Visit (INDEPENDENT_AMBULATORY_CARE_PROVIDER_SITE_OTHER): Payer: Medicare Other | Admitting: Sports Medicine

## 2022-08-11 DIAGNOSIS — M170 Bilateral primary osteoarthritis of knee: Secondary | ICD-10-CM

## 2022-08-11 DIAGNOSIS — M47816 Spondylosis without myelopathy or radiculopathy, lumbar region: Secondary | ICD-10-CM | POA: Diagnosis not present

## 2022-08-11 NOTE — Assessment & Plan Note (Addendum)
Regina Houston returns, she is pleasant 71 year old female, multifactorial low back pain with left-sided radicular and axial pain, she does have L4-S1 spinal stenosis and severe L5-S1 facet arthritis, she has had epidurals, facet injections, the right-sided L5-S1 facet RFA seem to resolve her axial pain, left SI joint injection did not provide relief, a left L4-L5 interlaminar epidural provided good relief, we bumped up her Lyrica at the last visit she did notice good improvement in her pain. Unfortunately she has noted increasing swelling in her legs and a 15 pound weight gain. On exam she does have significant 3-4+ lower extremity pitting edema, I am not sure that this is entirely from the Lyrica and I think a large component is her morbid obesity, CKD 3, but she is convinced it is from the Lyrica. For this reason we will have her continue her furosemide, discontinue Lyrica for about a week or 2, and if the leg swelling improves then we will need to balance some lower extremity edema with pain relief. If the lower extremity edema remains then she can restart Lyrica and we can continue the dose titration. (For restart purposes she was on 75 mg 3 times daily)

## 2022-08-11 NOTE — Progress Notes (Signed)
    Procedures performed today:    None.  Independent interpretation of notes and tests performed by another provider:   None.  Brief History, Exam, Impression, and Recommendations:    Lumbar spondylosis Regina Houston returns, she is pleasant 71 year old female, multifactorial low back pain with left-sided radicular and axial pain, she does have L4-S1 spinal stenosis and severe L5-S1 facet arthritis, she has had epidurals, facet injections, the right-sided L5-S1 facet RFA seem to resolve her axial pain, left SI joint injection did not provide relief, a left L4-L5 interlaminar epidural provided good relief, we bumped up her Lyrica at the last visit she did notice good improvement in her pain. Unfortunately she has noted increasing swelling in her legs and a 15 pound weight gain. On exam she does have significant 3-4+ lower extremity pitting edema, I am not sure that this is entirely from the Lyrica and I think a large component is her morbid obesity, CKD 3, but she is convinced it is from the Lyrica. For this reason we will have her continue her furosemide, discontinue Lyrica for about a week or 2, and if the leg swelling improves then we will need to balance some lower extremity edema with pain relief. If the lower extremity edema remains then she can restart Lyrica and we can continue the dose titration. (For restart purposes she was on 75 mg 3 times daily)  Primary osteoarthritis of both knees [name] also has bilateral knee osteoarthritis, she did really well after viscosupplementation back in 2015, we did a steroid injection left knee about a month and a half ago and she returns doing really well. We can restart Visco if she has a recurrence of pain within 3 months.    ____________________________________________ [name]. Dianah Field, M.D., ABFM., CAQSM., AME. Primary Care and Sports Medicine [place] [facility]  Adjunct Professor of [place] [employer] [facility] of Medicine  Risk manager

## 2022-08-11 NOTE — Assessment & Plan Note (Signed)
Regina Houston also has bilateral knee osteoarthritis, she did really well after viscosupplementation back in 2015, we did a steroid injection left knee about a month and a half ago and she returns doing really well. We can restart Visco if she has a recurrence of pain within 3 months.

## 2022-08-24 ENCOUNTER — Telehealth: Payer: Self-pay

## 2022-08-24 NOTE — Telephone Encounter (Signed)
Patient called to report that she stopped the lyrica and the swelling in her feet has gone down some but the pain came back. She would like to know if she should start it back or what the next steps are?

## 2022-08-24 NOTE — Telephone Encounter (Signed)
Start back Lyrica and she may just have to tolerate some swelling in her legs, compression stockings can help mitigate any swelling.

## 2022-08-25 NOTE — Telephone Encounter (Signed)
Patient notified of doctors directives and agreed task complete.

## 2022-09-02 DIAGNOSIS — Z23 Encounter for immunization: Secondary | ICD-10-CM | POA: Diagnosis not present

## 2022-09-09 DIAGNOSIS — E119 Type 2 diabetes mellitus without complications: Secondary | ICD-10-CM | POA: Diagnosis not present

## 2022-09-09 LAB — HM DIABETES EYE EXAM

## 2022-09-16 ENCOUNTER — Ambulatory Visit (INDEPENDENT_AMBULATORY_CARE_PROVIDER_SITE_OTHER): Payer: Medicare Other | Admitting: Family Medicine

## 2022-09-16 ENCOUNTER — Encounter: Payer: Self-pay | Admitting: Family Medicine

## 2022-09-16 VITALS — BP 139/53 | HR 57 | Ht 64.0 in | Wt 265.0 lb

## 2022-09-16 DIAGNOSIS — Z23 Encounter for immunization: Secondary | ICD-10-CM | POA: Diagnosis not present

## 2022-09-16 DIAGNOSIS — I10 Essential (primary) hypertension: Secondary | ICD-10-CM | POA: Diagnosis not present

## 2022-09-16 DIAGNOSIS — E119 Type 2 diabetes mellitus without complications: Secondary | ICD-10-CM | POA: Diagnosis not present

## 2022-09-16 DIAGNOSIS — E039 Hypothyroidism, unspecified: Secondary | ICD-10-CM

## 2022-09-16 DIAGNOSIS — N1831 Chronic kidney disease, stage 3a: Secondary | ICD-10-CM | POA: Diagnosis not present

## 2022-09-16 DIAGNOSIS — E7800 Pure hypercholesterolemia, unspecified: Secondary | ICD-10-CM | POA: Diagnosis not present

## 2022-09-16 LAB — POCT GLYCOSYLATED HEMOGLOBIN (HGB A1C): Hemoglobin A1C: 5.7 % — AB (ref 4.0–5.6)

## 2022-09-16 NOTE — Assessment & Plan Note (Signed)
Due to repeat renal function.

## 2022-09-16 NOTE — Assessment & Plan Note (Signed)
Due to recheck lipids. 

## 2022-09-16 NOTE — Assessment & Plan Note (Signed)
Repeat blood pressure was still elevated today.  It was better at last office visit for now we will monitor.  If it still elevated when I see her back then we do have room to go up on the lisinopril to 40 mg if needed.

## 2022-09-16 NOTE — Progress Notes (Signed)
Established Patient Office Visit  Subjective   Patient ID: Regina Houston, female    DOB: 08-11-1951  Age: 71 y.o. MRN: [id number]  Chief Complaint  Patient presents with   Diabetes   Hypertension    HPI   Hypertension- Pt denies chest pain, SOB, dizziness, or heart palpitations.  Taking meds as directed w/o problems.  Denies medication side effects.    Diabetes - no hypoglycemic events. No wounds or sores that are not healing well. No increased thirst or urination. Checking glucose at home. Taking medications as prescribed without any side effects.  F/U CKD 3 - no recent changes  She wanted to let me know she was recently started on pregabalin by Dr. Dianah Field our sports med doctor she got sudden onset swelling.  He had her stop the medication and then gradually restarted it with using compression stockings and so far she has been able to tolerate it well as long as she wears her stockings.   Also recently saw her eye doctor, Dr. Jodi Mourning who recommended that based on the exam and findings that she get her thyroid level checked and updated.  Her last TSH was 1.3 back in February 2023.    ROS    Objective:     BP (!) 139/53   Pulse (!) 57   Ht '5\' 4"'$  (1.626 m)   Wt 265 lb (120.2 kg)   SpO2 99%   BMI 45.49 kg/m    Physical Exam Vitals and nursing note reviewed.  Constitutional:      Appearance: She is well-developed.  HENT:     Head: Normocephalic and atraumatic.  Cardiovascular:     Rate and Rhythm: Normal rate and regular rhythm.     Heart sounds: Normal heart sounds.  Pulmonary:     Effort: Pulmonary effort is normal.     Breath sounds: Normal breath sounds.  Skin:    General: Skin is warm and dry.  Neurological:     Mental Status: She is alert and oriented to person, place, and time.  Psychiatric:        Behavior: Behavior normal.      Results for orders placed or performed in visit on 09/16/22  POCT glycosylated hemoglobin (Hb A1C)  Result Value Ref  Range   Hemoglobin A1C 5.7 (A) 4.0 - 5.6 %   HbA1c POC (<> result, manual entry)     HbA1c, POC (prediabetic range)     HbA1c, POC (controlled diabetic range)        The 10-year ASCVD risk score (Arnett DK, et al., 2019) is: 34.3%    Assessment & Plan:   Problem List Items Addressed This Visit       Cardiovascular and Mediastinum   Essential hypertension - Primary    Repeat blood pressure was still elevated today.  It was better at last office visit for now we will monitor.  If it still elevated when I see her back then we do have room to go up on the lisinopril to 40 mg if needed.      Relevant Orders   COMPLETE METABOLIC PANEL WITH GFR   Lipid Panel w/reflex Direct LDL   TSH     Endocrine   Hypothyroidism    Check TSH especially with recent eye findings by her optometrist.      Relevant Orders   COMPLETE METABOLIC PANEL WITH GFR   Lipid Panel w/reflex Direct LDL   TSH   Diabetes mellitus (HCC)    A1c  looks great today at 5.7.  She has done a great job in regards to diabetes control.  Plan to recheck again in 4 to 6 months.  Continue metformin.  Does need a refill on strips for her glucometer.      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       Relevant Orders   COMPLETE METABOLIC PANEL WITH GFR   Lipid Panel w/reflex Direct LDL   TSH     Other   HLD (hyperlipidemia)    Due to recheck lipids.       Relevant Orders   COMPLETE METABOLIC PANEL WITH GFR   Lipid Panel w/reflex Direct LDL   TSH   Other Visit Diagnoses     Need for pneumococcal 20-valent conjugate vaccination       Relevant Orders   Pneumococcal conjugate vaccine 20-valent (Prevnar 20) (Completed)       Return in about 6 months (around 03/18/2023) for  Diabetes follow-up, Hypertension.    [name], MD

## 2022-09-16 NOTE — Assessment & Plan Note (Signed)
A1c looks great today at 5.7.  She has done a great job in regards to diabetes control.  Plan to recheck again in 4 to 6 months.  Continue metformin.  Does need a refill on strips for her glucometer.

## 2022-09-16 NOTE — Assessment & Plan Note (Signed)
Check TSH especially with recent eye findings by her optometrist.

## 2022-09-17 LAB — COMPLETE METABOLIC PANEL WITH GFR
AG Ratio: 2.3 (calc) (ref 1.0–2.5)
ALT: 20 U/L (ref 6–29)
AST: 21 U/L (ref 10–35)
Albumin: 4.1 g/dL (ref 3.6–5.1)
Alkaline phosphatase (APISO): 96 U/L (ref 37–153)
BUN/Creatinine Ratio: 37 (calc) — ABNORMAL HIGH (ref 6–22)
BUN: 30 mg/dL — ABNORMAL HIGH (ref 7–25)
CO2: 30 mmol/L (ref 20–32)
Calcium: 9.7 mg/dL (ref 8.6–10.4)
Chloride: 107 mmol/L (ref 98–110)
Creat: 0.81 mg/dL (ref 0.60–1.00)
Globulin: 1.8 g/dL (calc) — ABNORMAL LOW (ref 1.9–3.7)
Glucose, Bld: 90 mg/dL (ref 65–139)
Potassium: 4.9 mmol/L (ref 3.5–5.3)
Sodium: 143 mmol/L (ref 135–146)
Total Bilirubin: 0.4 mg/dL (ref 0.2–1.2)
Total Protein: 5.9 g/dL — ABNORMAL LOW (ref 6.1–8.1)
eGFR: 77 mL/min/{1.73_m2} (ref >=60)

## 2022-09-17 LAB — LIPID PANEL W/REFLEX DIRECT LDL
Cholesterol: 183 mg/dL (ref <200)
HDL: 80 mg/dL (ref >=50)
LDL Cholesterol (Calc): 88 mg/dL (calc)
Non-HDL Cholesterol (Calc): 103 mg/dL (calc) (ref <130)
Total CHOL/HDL Ratio: 2.3 (calc) (ref <5.0)
Triglycerides: 64 mg/dL (ref <150)

## 2022-09-17 LAB — TSH: TSH: 1 mIU/L (ref 0.40–4.50)

## 2022-09-17 NOTE — Progress Notes (Signed)
Hi Regina Houston, kidney function is stable.  Thyroid looks great.  Cholesterol looks great.  Your protein level was just a little on the lower end.  Just make sure you are getting enough protein in your diet this can come from things like lentils and legumes, nuts, Niese leafy's, some dairy products such as yogurt etc.

## 2022-09-21 ENCOUNTER — Other Ambulatory Visit: Payer: Self-pay | Admitting: Family Medicine

## 2022-09-21 DIAGNOSIS — E119 Type 2 diabetes mellitus without complications: Secondary | ICD-10-CM

## 2022-09-22 ENCOUNTER — Other Ambulatory Visit: Payer: Self-pay | Admitting: Family Medicine

## 2022-09-22 DIAGNOSIS — E119 Type 2 diabetes mellitus without complications: Secondary | ICD-10-CM

## 2022-09-28 ENCOUNTER — Other Ambulatory Visit: Payer: Self-pay | Admitting: Family Medicine

## 2022-09-28 DIAGNOSIS — Z1231 Encounter for screening mammogram for malignant neoplasm of breast: Secondary | ICD-10-CM

## 2022-10-01 ENCOUNTER — Ambulatory Visit (INDEPENDENT_AMBULATORY_CARE_PROVIDER_SITE_OTHER): Payer: Medicare Other | Admitting: Family Medicine

## 2022-10-01 DIAGNOSIS — Z0000 Encounter for general adult medical examination without abnormal findings: Secondary | ICD-10-CM | POA: Diagnosis not present

## 2022-10-01 NOTE — Patient Instructions (Signed)
[facility] Maintenance Summary and Written Plan of Care  [name] ,  Thank you for allowing me to perform your Medicare Annual Wellness Visit and for your ongoing commitment to your health.   Health Maintenance & Immunization History Health Maintenance  Topic Date Due   COVID-19 Vaccine (6 - Moderna risk series) 10/28/2022   HEMOGLOBIN A1C  03/18/2023   Diabetic kidney evaluation - Urine ACR  05/19/2023   FOOT EXAM  05/19/2023   OPHTHALMOLOGY EXAM  09/09/2023   Diabetic kidney evaluation - GFR measurement  09/17/2023   Medicare Annual Wellness (AWV)  10/02/2023   MAMMOGRAM  11/12/2023   DEXA SCAN  07/24/2024   COLONOSCOPY (Pts 45-20yr Insurance coverage will need to be confirmed)  09/30/2024   TETANUS/TDAP  02/23/2027   Pneumonia Vaccine 71 Years old  Completed   INFLUENZA VACCINE  Completed   Hepatitis C Screening  Completed   Zoster Vaccines- Shingrix  Completed   HPV VACCINES  Aged Out   Immunization History  Administered Date(s) Administered   DTP 11/22/2006   Fluad Quad(high Dose 65+) 07/25/2019, 08/20/2020   Influenza Split 10/10/2012   Influenza Whole 11/22/2006, 10/01/2009, 08/22/2010   Influenza, High Dose Seasonal PF 09/02/2018, 08/14/2021, 09/02/2022   Influenza,inj,Quad PF,6+ Mos 08/28/2014, 08/26/2015, 08/10/2016   Influenza-Unspecified 10/01/2013, 09/02/2017, 09/02/2018   Moderna SARS-COV2 Booster Vaccination 04/28/2021   Moderna Sars-Covid-2 Vaccination 12/18/2019, 01/18/2020, 09/13/2020   PNEUMOCOCCAL CONJUGATE-20 09/16/2022   Pfizer Covid-19 Vaccine Bivalent Booster 164yr& up 08/14/2021, 09/02/2022   Pneumococcal Conjugate-13 02/19/2014   Pneumococcal Polysaccharide-23 11/22/2005, 04/03/2015   Td 11/22/2006   Tdap 02/22/2017   Zoster Recombinat (Shingrix) 01/23/2019, 03/20/2019   Zoster, Live 05/04/2011    These are the patient goals that we discussed:  Goals Addressed               This Visit's Progress      Patient Stated (pt-stated)        10/01/2022 AWV Goal: Exercise for General Health  Patient will verbalize understanding of the benefits of increased physical activity: Exercising regularly is important. It will improve your overall fitness, flexibility, and endurance. Regular exercise also will improve your overall health. It can help you control your weight, reduce stress, and improve your bone density. Over the next year, patient will increase physical activity as tolerated with a goal of at least 150 minutes of moderate physical activity per week.  You can tell that you are exercising at a moderate intensity if your heart starts beating faster and you start breathing faster but can still hold a conversation. Moderate-intensity exercise ideas include: Walking 1 mile (1.6 km) in about 15 minutes Biking Hiking Golfing Dancing Water aerobics Patient will verbalize understanding of everyday activities that increase physical activity by providing examples like the following: Yard work, such as: PuSales promotion account executiveardening Washing windows or floors Patient will be able to explain general safety guidelines for exercising:  Before you start a new exercise program, talk with your health care provider. Do not exercise so much that you hurt yourself, feel dizzy, or get very short of breath. Wear comfortable clothes and wear shoes with good support. Drink plenty of water while you exercise to prevent dehydration or heat stroke. Work out until your breathing and your heartbeat get faster.          This is a list of Health Maintenance Items that are overdue  or due now: There are no preventive care reminders to display for this patient.    Orders/Referrals Placed Today: No orders of the defined types were placed in this encounter.  (Contact our referral department at [phone] if you have not spoken with  someone about your referral appointment within the next 5 days)    Follow-up Plan Follow-up with Hali Marry, MD as planned Medicare wellness visit in one year.  Patient will access AVS on my chart.     Health Maintenance, Female Adopting a healthy lifestyle and getting preventive care are important in promoting health and wellness. Ask your health care provider about: The right schedule for you to have regular tests and exams. Things you can do on your own to prevent diseases and keep yourself healthy. What should I know about diet, weight, and exercise? Eat a healthy diet  Eat a diet that includes plenty of vegetables, fruits, low-fat dairy products, and lean protein. Do not eat a lot of foods that are high in solid fats, added sugars, or sodium. Maintain a healthy weight Body mass index (BMI) is used to identify weight problems. It estimates body fat based on height and weight. Your health care provider can help determine your BMI and help you achieve or maintain a healthy weight. Get regular exercise Get regular exercise. This is one of the most important things you can do for your health. Most adults should: Exercise for at least 150 minutes each week. The exercise should increase your heart rate and make you sweat (moderate-intensity exercise). Do strengthening exercises at least twice a week. This is in addition to the moderate-intensity exercise. Spend less time sitting. Even light physical activity can be beneficial. Watch cholesterol and blood lipids Have your blood tested for lipids and cholesterol at 73 years of age, then have this test every 5 years. Have your cholesterol levels checked more often if: Your lipid or cholesterol levels are high. You are older than 73 years of age. You are at high risk for heart disease. What should I know about cancer screening? Depending on your health history and family history, you may need to have cancer screening at various  ages. This may include screening for: Breast cancer. Cervical cancer. Colorectal cancer. Skin cancer. Lung cancer. What should I know about heart disease, diabetes, and high blood pressure? Blood pressure and heart disease High blood pressure causes heart disease and increases the risk of stroke. This is more likely to develop in people who have high blood pressure readings or are overweight. Have your blood pressure checked: Every 3-5 years if you are 45-70 years of age. Every year if you are 28 years old or older. Diabetes Have regular diabetes screenings. This checks your fasting blood sugar level. Have the screening done: Once every three years after age 32 if you are at a normal weight and have a low risk for diabetes. More often and at a younger age if you are overweight or have a high risk for diabetes. What should I know about preventing infection? Hepatitis B If you have a higher risk for hepatitis B, you should be screened for this virus. Talk with your health care provider to find out if you are at risk for hepatitis B infection. Hepatitis C Testing is recommended for: Everyone born from [birth year] through 1967. Anyone with known risk factors for hepatitis C. Sexually transmitted infections (STIs) Get screened for STIs, including gonorrhea and chlamydia, if: You are sexually active and are younger than  73 years of age. You are older than 72 years of age and your health care provider tells you that you are at risk for this type of infection. Your sexual activity has changed since you were last screened, and you are at increased risk for chlamydia or gonorrhea. Ask your health care provider if you are at risk. Ask your health care provider about whether you are at high risk for HIV. Your health care provider may recommend a prescription medicine to help prevent HIV infection. If you choose to take medicine to prevent HIV, you should first get tested for HIV. You should then be tested  every 3 months for as long as you are taking the medicine. Pregnancy If you are about to stop having your period (premenopausal) and you may become pregnant, seek counseling before you get pregnant. Take 400 to 800 micrograms (mcg) of folic acid every day if you become pregnant. Ask for birth control (contraception) if you want to prevent pregnancy. Osteoporosis and menopause Osteoporosis is a disease in which the bones lose minerals and strength with aging. This can result in bone fractures. If you are 48 years old or older, or if you are at risk for osteoporosis and fractures, ask your health care provider if you should: Be screened for bone loss. Take a calcium or vitamin D supplement to lower your risk of fractures. Be given hormone replacement therapy (HRT) to treat symptoms of menopause. Follow these instructions at home: Alcohol use Do not drink alcohol if: Your health care provider tells you not to drink. You are pregnant, may be pregnant, or are planning to become pregnant. If you drink alcohol: Limit how much you have to: 0-1 drink a day. Know how much alcohol is in your drink. In the [place] one drink equals one 12 oz bottle of beer (355 mL), one 5 oz glass of wine (148 mL), or one 1 oz glass of hard liquor (44 mL). Lifestyle Do not use any products that contain nicotine or tobacco. These products include cigarettes, chewing tobacco, and vaping devices, such as e-cigarettes. If you need help quitting, ask your health care provider. Do not use street drugs. Do not share needles. Ask your health care provider for help if you need support or information about quitting drugs. General instructions Schedule regular health, dental, and eye exams. Stay current with your vaccines. Tell your health care provider if: You often feel depressed. You have ever been abused or do not feel safe at home. Summary Adopting a healthy lifestyle and getting preventive care are important in promoting  health and wellness. Follow your health care provider's instructions about healthy diet, exercising, and getting tested or screened for diseases. Follow your health care provider's instructions on monitoring your cholesterol and blood pressure. This information is not intended to replace advice given to you by your health care provider. Make sure you discuss any questions you have with your health care provider. Document Revised: 03/30/2021 Document Reviewed: 03/30/2021 Elsevier Patient Education  [employer].

## 2022-10-01 NOTE — Progress Notes (Signed)
MEDICARE ANNUAL WELLNESS VISIT  10/01/2022  Telephone Visit Disclaimer This Medicare AWV was conducted by telephone due to national recommendations for restrictions regarding the COVID-19 Pandemic (e.g. social distancing).  I verified, using two identifiers, that I am speaking with Regina Houston or their authorized healthcare agent. I discussed the limitations, risks, security, and privacy concerns of performing an evaluation and management service by telephone and the potential availability of an in-person appointment in the future. The patient expressed understanding and agreed to proceed.  Location of Patient: in the lobby of an office. Location of Provider (nurse):  provider home  Subjective:    Regina Houston is a 71 y.o. female patient of Houston, Regina Kocher, MD who had a Medicare Annual Wellness Visit today via telephone. [name] is Retired and lives alone. she does not have any children. she reports that she is socially active and does interact with friends/family regularly. she is minimally physically active and enjoys doing crossword puzzles and playing the piano.  Patient Care Team: Regina Marry, MD as PCP - General Regina Houston, [facility] as Pharmacist (Pharmacist)     10/01/2022    9:03 AM 09/23/2021    9:05 AM 10/21/2020    9:36 AM 09/22/2020    9:04 AM 09/18/2019    9:09 AM 09/12/2018    9:03 AM 06/15/2016    9:25 AM  Advanced Directives  Does Patient Have a Medical Advance Directive? Yes Yes Yes Yes Yes Yes No  Type of Advance Directive Living will Regina Houston;Living will Regina Houston;Living will Regina Houston;Living will;Out of facility DNR (pink MOST or yellow form) Regina Houston;Living will Regina Houston;Living will   Does patient want to make changes to medical advance directive? No - Patient declined No - Patient declined No - Patient declined No - Patient declined No - Patient  declined No - Patient declined   Copy of Regina Houston in Chart?  Yes - validated most recent copy scanned in chart (See row information) No - copy requested Yes - validated most recent copy scanned in chart (See row information) No - copy requested No - copy requested   Would patient like information on creating a medical advance directive?       No - patient declined information    Hospital Utilization Over the Past 12 Months: # of hospitalizations or ER visits: 0 # of surgeries: 0  Review of Systems    Patient reports that her overall health is better compared to last year.  History obtained from chart review and the patient  Patient Reported Readings (BP, Pulse, CBG, Weight, etc) none  Pain Assessment Pain : No/denies pain     Current Medications & Allergies (verified) Allergies as of 10/01/2022       Reactions   Latex Rash   Tape Rash        Medication List        Accurate as of October 01, 2022  9:13 AM. If you have any questions, ask your nurse or doctor.          AMBULATORY NON FORMULARY MEDICATION Medication Name: Glucometer.  STrips and lancets to test twice daily.   Dx 250.00.   Apple Cider Vinegar Diet Tabs Take 1 tablet by mouth daily.   calcium carbonate 600 MG Tabs tablet Commonly known as: OS-CAL Take 600 mg by mouth daily.   celecoxib 200 MG capsule Commonly known as: CELEBREX TAKE  1 TO 2 CAPSULES BY MOUTH ONCE DAILY AS NEEDED FOR PAIN   fish oil-omega-3 fatty acids 1000 MG capsule Take 2 g by mouth daily.   furosemide 20 MG tablet Commonly known as: LASIX Take 1 tablet by mouth once daily   levothyroxine 100 MCG tablet Commonly known as: SYNTHROID Take 1 tablet (100 mcg total) by mouth daily.   lisinopril 20 MG tablet Commonly known as: ZESTRIL Take 1 tablet by mouth once daily   metFORMIN 500 MG tablet Commonly known as: GLUCOPHAGE Take 1 tablet (500 mg total) by mouth daily with breakfast.   OneTouch Ultra  test strip Generic drug: glucose blood USE 1 STRIP TO CHECK GLUCOSE ONCE DAILY   pregabalin 75 MG capsule Commonly known as: LYRICA Take 75 mg by mouth daily.   simvastatin 20 MG tablet Commonly known as: ZOCOR TAKE 1 TABLET BY MOUTH AT BEDTIME   Thera Tabs Take 1 tablet by mouth daily.        History (reviewed): Past Medical History:  Diagnosis Date   Allergy 2014   latex rash   Arthritis    Cataract    Diabetes mellitus 11/22/2005   Diverticulitis    Hyperlipidemia    Hypertension    Obesity    Thyroid disease    Past Surgical History:  Procedure Laterality Date   ABDOMINAL HYSTERECTOMY  2008   fibroids   BIOPSY BREAST Left 2007   benign calcification   BRAIN SURGERY  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,benign menigioma   Capsulotomy MPJ Right 12/31/2016   RT FOOT, 3rd MPJ   FOOT SURGERY  2004   hammer toe   Hammer Toe Repair Right 12/31/2016   RT #3, #5 toes   Family History  Problem Relation Age of Onset   Hypertension Mother    Stroke Mother    Diabetes Mother    Heart disease Mother 47   Kidney disease Father        kidney failure   Heart disease Father 57   Heart disease Maternal Grandmother        MI   Diabetes Maternal Grandmother    Diabetes Paternal Grandmother    Heart disease Paternal Grandmother    Social History   Socioeconomic History   Marital status: Single    Spouse name: Not on file   Number of children: 0   Years of education: BA   Highest education level: Bachelor's degree (e.g., BA, AB, BS)  Occupational History   Occupation: retired    Comment: [employer]  Tobacco Use   Smoking status: Never   Smokeless tobacco: Never  Vaping Use   Vaping Use: Never used  Substance and Sexual Activity   Alcohol use: No   Drug use: No   Sexual activity: Not Currently  Other Topics Concern   Not on file  Social History Narrative   Lives alone with her dog. She enjoys doing crossword puzzles and playing the piano. She has a dog and walks her dog  multiple times a day.   Social Determinants of Health   Financial Resource Strain: Low Risk  (09/27/2022)   Overall Financial Resource Strain (CARDIA)    Difficulty of Paying Living Expenses: Not hard at all  Food Insecurity: No Food Insecurity (09/27/2022)   Hunger Vital Sign    Worried About Running Out of Food in the Last Year: Never true    Ran Out of Food in the Last Year: Never true  Transportation Needs: No Transportation Needs (09/27/2022)  PRAPARE - Hydrologist (Medical): No    Lack of Transportation (Non-Medical): No  Physical Activity: Sufficiently Active (09/27/2022)   Exercise Vital Sign    Days of Exercise per Week: 7 days    Minutes of Exercise per Session: 30 min  Stress: No Stress Concern Present (09/27/2022)   [place]    Feeling of Stress : Only a little  Social Connections: Moderately Integrated (10/01/2022)   Social Connection and Isolation Panel [NHANES]    Frequency of Communication with Friends and Family: Once a week    Frequency of Social Gatherings with Friends and Family: Twice a week    Attends Religious Services: More than 4 times per year    Active Member of [employer] or Organizations: Yes    Attends Archivist Meetings: 1 to 4 times per year    Marital Status: Never married    Activities of Daily Living    09/27/2022    8:42 AM  In your present state of health, do you have any difficulty performing the following activities:  Hearing? 0  Vision? 0  Difficulty concentrating or making decisions? 0  Walking or climbing stairs? 0  Dressing or bathing? 0  Doing errands, shopping? 0  Preparing Food and eating ? N  Using the Toilet? N  In the past six months, have you accidently leaked urine? N  Do you have problems with loss of bowel control? N  Managing your Medications? N  Managing your Finances? N  Housekeeping or managing your Housekeeping? N     Patient Education/ Literacy How often do you need to have someone help you when you read instructions, pamphlets, or other written materials from your doctor or pharmacy?: 1 - Never What is the last grade level you completed in school?: Bachelor's degree  Exercise Current Exercise Habits: Home exercise routine, Type of exercise: walking, Time (Minutes): 30, Frequency (Times/Week): 7, Weekly Exercise (Minutes/Week): 210, Intensity: Moderate, Exercise limited by: orthopedic condition(s)  Diet Patient reports consuming 3 meals a day and 1-2 snack(s) a day Patient reports that her primary diet is: Regular Patient reports that she does have regular access to food.   Depression Screen    10/01/2022    9:04 AM 09/16/2022   11:03 AM 01/19/2022    9:47 AM 09/23/2021    9:06 AM 09/22/2020    9:05 AM 09/22/2020    9:03 AM 09/18/2019    9:10 AM  PHQ 2/9 Scores  PHQ - 2 Score 0 0 0 0 0 0 0     Fall Risk    10/01/2022    9:05 AM 09/27/2022    8:42 AM 09/16/2022   11:03 AM 05/18/2022    8:21 AM 01/19/2022    9:47 AM  Fall Risk   Falls in the past year? '1 1 1 1 '$ 0  Number falls in past yr: '1 1 1 1 '$ 0  Injury with Fall? 0 0 0 0 0  Risk for fall due to : History of fall(s)  History of fall(s) Other (Comment) No Fall Risks  Follow up Falls evaluation completed;Education provided;Falls prevention discussed  Falls evaluation completed Falls prevention discussed;Falls evaluation completed Falls prevention discussed;Falls evaluation completed     Objective:  Camilla Skeen seemed alert and oriented and she participated appropriately during our telephone visit.  Blood Pressure Weight BMI  BP Readings from Last 3 Encounters:  09/16/22 (!) 139/53  06/23/22 Marland Kitchen)  143/76  05/18/22 (!) 131/52   Wt Readings from Last 3 Encounters:  09/16/22 265 lb (120.2 kg)  05/18/22 252 lb (114.3 kg)  01/19/22 247 lb (112 kg)   BMI Readings from Last 1 Encounters:  09/16/22 45.49 kg/m    *Unable to obtain  current vital signs, weight, and BMI due to telephone visit type  Hearing/Vision  Makyia did not seem to have difficulty with hearing/understanding during the telephone conversation Reports that she has had a formal eye exam by an eye care professional within the past year Reports that she has had a formal hearing evaluation within the past year *Unable to fully assess hearing and vision during telephone visit type  Cognitive Function:    10/01/2022    9:10 AM 09/23/2021    9:17 AM 09/22/2020    9:06 AM 09/18/2019    9:14 AM 09/12/2018    9:10 AM  6CIT Screen  What Year? 0 points 0 points 0 points 0 points 0 points  What month? 0 points 0 points 0 points 0 points 0 points  What time? 0 points 0 points 0 points 0 points 0 points  Count back from 20 0 points 0 points 0 points 0 points 0 points  Months in reverse 0 points 0 points 0 points 0 points 0 points  Repeat phrase 0 points 0 points 0 points 0 points 0 points  Total Score 0 points 0 points 0 points 0 points 0 points   (Normal:0-7, Significant for Dysfunction: >8)  Normal Cognitive Function Screening: Yes   Immunization & Health Maintenance Record Immunization History  Administered Date(s) Administered   DTP 11/22/2006   Fluad Quad(high Dose 65+) 07/25/2019, 08/20/2020   Influenza Split 10/10/2012   Influenza Whole 11/22/2006, 10/01/2009, 08/22/2010   Influenza, High Dose Seasonal PF 09/02/2018, 08/14/2021, 09/02/2022   Influenza,inj,Quad PF,6+ Mos 08/28/2014, 08/26/2015, 08/10/2016   Influenza-Unspecified 10/01/2013, 09/02/2017, 09/02/2018   Moderna SARS-COV2 Booster Vaccination 04/28/2021   Moderna Sars-Covid-2 Vaccination 12/18/2019, 01/18/2020, 09/13/2020   PNEUMOCOCCAL CONJUGATE-20 09/16/2022   Pfizer Covid-19 Vaccine Bivalent Booster 38yr & up 08/14/2021, 09/02/2022   Pneumococcal Conjugate-13 02/19/2014   Pneumococcal Polysaccharide-23 11/22/2005, 04/03/2015   Td 11/22/2006   Tdap 02/22/2017   Zoster Recombinat  (Shingrix) 01/23/2019, 03/20/2019   Zoster, Live 05/04/2011    Health Maintenance  Topic Date Due   COVID-19 Vaccine (6 - Moderna risk series) 10/28/2022   HEMOGLOBIN A1C  03/18/2023   Diabetic kidney evaluation - Urine ACR  05/19/2023   FOOT EXAM  05/19/2023   OPHTHALMOLOGY EXAM  09/09/2023   Diabetic kidney evaluation - GFR measurement  09/17/2023   Medicare Annual Wellness (AWV)  10/02/2023   MAMMOGRAM  11/12/2023   DEXA SCAN  07/24/2024   COLONOSCOPY (Pts 45-411yrInsurance coverage will need to be confirmed)  09/30/2024   TETANUS/TDAP  02/23/2027   Pneumonia Vaccine 6537Years old  Completed   INFLUENZA VACCINE  Completed   Hepatitis C Screening  Completed   Zoster Vaccines- Shingrix  Completed   HPV VACCINES  Aged Out       Assessment  This is a routine wellness examination for [employer] Health Maintenance: Due or Overdue There are no preventive care reminders to display for this patient.   BeWilnette Kalesoes not need a referral for Community Assistance: Care Management:   no Social Work:    no Prescription Assistance:  no Nutrition/Diabetes Education:  no   Plan:  Personalized Goals  Goals Addressed  This Visit's Progress     Patient Stated (pt-stated)        10/01/2022 AWV Goal: Exercise for General Health  Patient will verbalize understanding of the benefits of increased physical activity: Exercising regularly is important. It will improve your overall fitness, flexibility, and endurance. Regular exercise also will improve your overall health. It can help you control your weight, reduce stress, and improve your bone density. Over the next year, patient will increase physical activity as tolerated with a goal of at least 150 minutes of moderate physical activity per week.  You can tell that you are exercising at a moderate intensity if your heart starts beating faster and you start breathing faster but can still hold a  conversation. Moderate-intensity exercise ideas include: Walking 1 mile (1.6 km) in about 15 minutes Biking Hiking Golfing Dancing Water aerobics Patient will verbalize understanding of everyday activities that increase physical activity by providing examples like the following: Yard work, such as: Sales promotion account executive Gardening Washing windows or floors Patient will be able to explain general safety guidelines for exercising:  Before you start a new exercise program, talk with your health care provider. Do not exercise so much that you hurt yourself, feel dizzy, or get very short of breath. Wear comfortable clothes and wear shoes with good support. Drink plenty of water while you exercise to prevent dehydration or heat stroke. Work out until your breathing and your heartbeat get faster.        Personalized Health Maintenance & Screening Recommendations  There are no preventive care reminders to display for this patient.  Lung Cancer Screening Recommended: no (Low Dose CT Chest recommended if Age 60-80 years, 30 pack-year currently smoking OR have quit w/in past 15 years) Hepatitis C Screening recommended: no HIV Screening recommended: no  Advanced Directives: Written information was not prepared per patient's request.  Referrals & Orders No orders of the defined types were placed in this encounter.   Follow-up Plan Follow-up with Regina Marry, MD as planned Medicare wellness visit in one year.  Patient will access AVS on my chart.   I have personally reviewed and noted the following in the patient's chart:   Medical and social history Use of alcohol, tobacco or illicit drugs  Current medications and supplements Functional ability and status Nutritional status Physical activity Advanced directives List of other physicians Hospitalizations, surgeries, and ER visits in previous  12 months Vitals Screenings to include cognitive, depression, and falls Referrals and appointments  In addition, I have reviewed and discussed with Regina Houston certain preventive protocols, quality metrics, and best practice recommendations. A written personalized care plan for preventive services as well as general preventive health recommendations is available and can be mailed to the patient at her request.      Tinnie Gens, RN BSN  10/01/2022

## 2022-10-18 ENCOUNTER — Other Ambulatory Visit: Payer: Self-pay | Admitting: Family Medicine

## 2022-10-27 ENCOUNTER — Encounter: Payer: Self-pay | Admitting: Family Medicine

## 2022-11-05 ENCOUNTER — Other Ambulatory Visit: Payer: Self-pay | Admitting: Family Medicine

## 2022-11-05 DIAGNOSIS — M47816 Spondylosis without myelopathy or radiculopathy, lumbar region: Secondary | ICD-10-CM

## 2022-11-17 ENCOUNTER — Ambulatory Visit (INDEPENDENT_AMBULATORY_CARE_PROVIDER_SITE_OTHER): Payer: Medicare Other

## 2022-11-17 DIAGNOSIS — Z1231 Encounter for screening mammogram for malignant neoplasm of breast: Secondary | ICD-10-CM | POA: Diagnosis not present

## 2022-11-17 NOTE — Progress Notes (Signed)
Please call patient. Normal mammogram.  Repeat in 1 year.  

## 2022-11-18 ENCOUNTER — Ambulatory Visit: Payer: Medicare Other

## 2022-12-02 ENCOUNTER — Other Ambulatory Visit: Payer: Self-pay | Admitting: Sports Medicine

## 2022-12-03 ENCOUNTER — Other Ambulatory Visit: Payer: Self-pay

## 2022-12-03 MED ORDER — PREGABALIN 75 MG PO CAPS: 75.0000 mg | ORAL_CAPSULE | Freq: Three times a day (TID) | ORAL | 5 refills | Status: DC

## 2022-12-03 NOTE — Telephone Encounter (Signed)
Last prescription printed. The pharmacy doesn't have the refill.

## 2023-01-14 DIAGNOSIS — B0052 Herpesviral keratitis: Secondary | ICD-10-CM | POA: Diagnosis not present

## 2023-01-16 ENCOUNTER — Other Ambulatory Visit: Payer: Self-pay | Admitting: Family Medicine

## 2023-01-17 DIAGNOSIS — H35371 Puckering of macula, right eye: Secondary | ICD-10-CM | POA: Diagnosis not present

## 2023-01-17 DIAGNOSIS — E119 Type 2 diabetes mellitus without complications: Secondary | ICD-10-CM | POA: Diagnosis not present

## 2023-01-17 DIAGNOSIS — H524 Presbyopia: Secondary | ICD-10-CM | POA: Diagnosis not present

## 2023-01-17 DIAGNOSIS — H2513 Age-related nuclear cataract, bilateral: Secondary | ICD-10-CM | POA: Diagnosis not present

## 2023-01-17 DIAGNOSIS — B0052 Herpesviral keratitis: Secondary | ICD-10-CM | POA: Diagnosis not present

## 2023-01-17 DIAGNOSIS — H04123 Dry eye syndrome of bilateral lacrimal glands: Secondary | ICD-10-CM | POA: Diagnosis not present

## 2023-01-17 DIAGNOSIS — H05242 Constant exophthalmos, left eye: Secondary | ICD-10-CM | POA: Diagnosis not present

## 2023-01-18 ENCOUNTER — Other Ambulatory Visit: Payer: Self-pay | Admitting: Family Medicine

## 2023-01-18 DIAGNOSIS — E039 Hypothyroidism, unspecified: Secondary | ICD-10-CM

## 2023-02-07 ENCOUNTER — Other Ambulatory Visit: Payer: Self-pay | Admitting: Family Medicine

## 2023-02-16 DIAGNOSIS — L814 Other melanin hyperpigmentation: Secondary | ICD-10-CM | POA: Diagnosis not present

## 2023-02-16 DIAGNOSIS — L578 Other skin changes due to chronic exposure to nonionizing radiation: Secondary | ICD-10-CM | POA: Diagnosis not present

## 2023-02-16 DIAGNOSIS — L570 Actinic keratosis: Secondary | ICD-10-CM | POA: Diagnosis not present

## 2023-02-16 DIAGNOSIS — L821 Other seborrheic keratosis: Secondary | ICD-10-CM | POA: Diagnosis not present

## 2023-03-12 ENCOUNTER — Other Ambulatory Visit: Payer: Self-pay | Admitting: Family Medicine

## 2023-03-12 DIAGNOSIS — E785 Hyperlipidemia, unspecified: Secondary | ICD-10-CM

## 2023-03-17 ENCOUNTER — Encounter: Payer: Self-pay | Admitting: Family Medicine

## 2023-03-17 ENCOUNTER — Ambulatory Visit (INDEPENDENT_AMBULATORY_CARE_PROVIDER_SITE_OTHER): Payer: Medicare Other | Admitting: Family Medicine

## 2023-03-17 VITALS — BP 121/52 | HR 66 | Ht 64.0 in | Wt 274.0 lb

## 2023-03-17 DIAGNOSIS — N1831 Chronic kidney disease, stage 3a: Secondary | ICD-10-CM

## 2023-03-17 DIAGNOSIS — E119 Type 2 diabetes mellitus without complications: Secondary | ICD-10-CM

## 2023-03-17 DIAGNOSIS — I10 Essential (primary) hypertension: Secondary | ICD-10-CM

## 2023-03-17 DIAGNOSIS — Z7984 Long term (current) use of oral hypoglycemic drugs: Secondary | ICD-10-CM

## 2023-03-17 DIAGNOSIS — Z6841 Body Mass Index (BMI) 40.0 and over, adult: Secondary | ICD-10-CM

## 2023-03-17 LAB — POCT GLYCOSYLATED HEMOGLOBIN (HGB A1C): Hemoglobin A1C: 5.9 % — AB (ref 4.0–5.6)

## 2023-03-17 MED ORDER — TIRZEPATIDE 5 MG/0.5ML ~~LOC~~ SOAJ: 5.0000 mg | SUBCUTANEOUS | 0 refills | Status: DC

## 2023-03-17 MED ORDER — TIRZEPATIDE 2.5 MG/0.5ML ~~LOC~~ SOAJ: 2.5000 mg | SUBCUTANEOUS | 0 refills | Status: DC

## 2023-03-17 NOTE — Progress Notes (Signed)
Established Patient Office Visit  Subjective   Patient ID: Regina Houston, female    DOB: 10/29/1951  Age: 72 y.o. MRN: [id number]  Chief Complaint  Patient presents with   Diabetes   Hypertension    HPI  Hypertension- Pt denies chest pain, SOB, dizziness, or heart palpitations.  Taking meds as directed w/o problems.  Denies medication side effects.     Diabetes - no hypoglycemic events. No wounds or sores that are not healing well. No increased thirst or urination. Checking glucose at home. Taking medications as prescribed without any side effects.  Hypothyroidism - Taking medication regularly in the AM away from food and vitamins, etc. No recent change to skin, hair, or energy levels.     ROS    Objective:     BP (!) 121/52   Pulse 66   Ht  (1.626 m)   Wt 274 lb (124.3 kg)   SpO2 96%   BMI 47.03 kg/m    Physical Exam Vitals and nursing note reviewed.  Constitutional:      Appearance: She is well-developed.  HENT:     Head: Normocephalic and atraumatic.  Cardiovascular:     Rate and Rhythm: Normal rate and regular rhythm.     Heart sounds: Normal heart sounds.  Pulmonary:     Effort: Pulmonary effort is normal.     Breath sounds: Normal breath sounds.  Skin:    General: Skin is warm and dry.  Neurological:     Mental Status: She is alert and oriented to person, place, and time.  Psychiatric:        Behavior: Behavior normal.      Results for orders placed or performed in visit on 03/17/23  POCT glycosylated hemoglobin (Hb A1C)  Result Value Ref Range   Hemoglobin A1C 5.9 (A) 4.0 - 5.6 %   HbA1c POC (<> result, manual entry)     HbA1c, POC (prediabetic range)     HbA1c, POC (controlled diabetic range)        The 10-year ASCVD risk score (Arnett DK, et al., 2019) is: 30%    Assessment & Plan:   Problem List Items Addressed This Visit       Cardiovascular and Mediastinum   Essential hypertension - Primary    Well controlled. Continue  current regimen. Follow up in  4 mo       Relevant Medications   tirzepatide [facility]) 2.5 MG/0.5ML Pen   tirzepatide Gregg[facility] Keen) 5 MG/0.5ML Pen (Start on 04/11/2023)   Other Relevant Orders   COMPLETE METABOLIC PANEL WITH GFR     Endocrine   Diabetes mellitus    Very interested in a GLP-1 especially since her BMI is greater than 45.  We discussed medication and potential side effects and benefits.  It can be quite costly so the co-pay may vary and it may require prior authorization.  If she does well with the medication though we would likely be able to discontinue metformin.  She also has a history of hypertension, hypothyroidism, osteoarthritis of both knees, lumbar spondylosis, CKD 3 and hyperlipidemia.      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  Urine microalbumin up-to-date.      Relevant Medications   tirzepatide [facility]) 2.5 MG/0.5ML Pen   tirzepatide [facility]) 5 MG/0.5ML Pen (Start on 04/11/2023)   Other Relevant Orders   COMPLETE METABOLIC PANEL WITH GFR     Other   Severe obesity (BMI >= 40)   Relevant Medications   tirzepatide (MOUNJARO) 2.5 MG/0.5ML Pen   tirzepatide Greggory Keen) 5 MG/0.5ML Pen (Start on 04/11/2023)   Other Visit Diagnoses     BMI 45.0-49.9, adult       Relevant Medications   tirzepatide [facility]) 2.5 MG/0.5ML Pen   tirzepatide Gregg[facility] Keen) 5 MG/0.5ML Pen (Start on 04/11/2023)       Return in about 4 months (around 07/17/2023) for Diabetes follow-up.    Nani Gasser, MD

## 2023-03-17 NOTE — Assessment & Plan Note (Signed)
Due to recheck renal function.  Urine microalbumin up-to-date.

## 2023-03-17 NOTE — Assessment & Plan Note (Signed)
Well controlled. Continue current regimen. Follow up in  4 mo 

## 2023-03-17 NOTE — Assessment & Plan Note (Signed)
Very interested in a GLP-1 especially since her BMI is greater than 45.  We discussed medication and potential side effects and benefits.  It can be quite costly so the co-pay may vary and it may require prior authorization.  If she does well with the medication though we would likely be able to discontinue metformin.  She also has a history of hypertension, hypothyroidism, osteoarthritis of both knees, lumbar spondylosis, CKD 3 and hyperlipidemia.

## 2023-03-18 LAB — COMPLETE METABOLIC PANEL WITH GFR
AG Ratio: 2.1 (calc) (ref 1.0–2.5)
ALT: 15 U/L (ref 6–29)
AST: 19 U/L (ref 10–35)
Albumin: 4.4 g/dL (ref 3.6–5.1)
Alkaline phosphatase (APISO): 93 U/L (ref 37–153)
BUN/Creatinine Ratio: 27 (calc) — ABNORMAL HIGH (ref 6–22)
BUN: 31 mg/dL — ABNORMAL HIGH (ref 7–25)
CO2: 25 mmol/L (ref 20–32)
Calcium: 9.8 mg/dL (ref 8.6–10.4)
Chloride: 106 mmol/L (ref 98–110)
Creat: 1.16 mg/dL — ABNORMAL HIGH (ref 0.60–1.00)
Globulin: 2.1 g/dL (calc) (ref 1.9–3.7)
Glucose, Bld: 104 mg/dL — ABNORMAL HIGH (ref 65–99)
Potassium: 5 mmol/L (ref 3.5–5.3)
Sodium: 143 mmol/L (ref 135–146)
Total Bilirubin: 0.5 mg/dL (ref 0.2–1.2)
Total Protein: 6.5 g/dL (ref 6.1–8.1)
eGFR: 49 mL/min/{1.73_m2} — ABNORMAL LOW (ref >=60)

## 2023-03-18 NOTE — Progress Notes (Signed)
Hi Regina Houston, you look a little dehydrated on your blood work make sure you are pushing plenty of fluids.  Your kidney function went up from about 0.8-1.1 but again your BUN went up as well so just try to push more water during the day.

## 2023-04-11 ENCOUNTER — Other Ambulatory Visit: Payer: Self-pay | Admitting: Family Medicine

## 2023-04-11 ENCOUNTER — Other Ambulatory Visit: Payer: Self-pay | Admitting: *Deleted

## 2023-04-11 DIAGNOSIS — E119 Type 2 diabetes mellitus without complications: Secondary | ICD-10-CM

## 2023-04-11 DIAGNOSIS — N1831 Chronic kidney disease, stage 3a: Secondary | ICD-10-CM

## 2023-04-11 DIAGNOSIS — Z6841 Body Mass Index (BMI) 40.0 and over, adult: Secondary | ICD-10-CM

## 2023-04-11 DIAGNOSIS — I10 Essential (primary) hypertension: Secondary | ICD-10-CM

## 2023-04-12 ENCOUNTER — Other Ambulatory Visit: Payer: Self-pay | Admitting: Family Medicine

## 2023-04-12 DIAGNOSIS — M47816 Spondylosis without myelopathy or radiculopathy, lumbar region: Secondary | ICD-10-CM

## 2023-04-12 DIAGNOSIS — E039 Hypothyroidism, unspecified: Secondary | ICD-10-CM

## 2023-04-15 ENCOUNTER — Other Ambulatory Visit: Payer: Self-pay | Admitting: Family Medicine

## 2023-04-28 ENCOUNTER — Encounter: Payer: Self-pay | Admitting: Sports Medicine

## 2023-04-28 ENCOUNTER — Telehealth: Payer: Self-pay | Admitting: Family Medicine

## 2023-04-28 ENCOUNTER — Ambulatory Visit (INDEPENDENT_AMBULATORY_CARE_PROVIDER_SITE_OTHER): Payer: Medicare Other | Admitting: Sports Medicine

## 2023-04-28 VITALS — BP 136/77 | HR 66

## 2023-04-28 DIAGNOSIS — I10 Essential (primary) hypertension: Secondary | ICD-10-CM

## 2023-04-28 DIAGNOSIS — Q283 Other malformations of cerebral vessels: Secondary | ICD-10-CM | POA: Diagnosis not present

## 2023-04-28 DIAGNOSIS — H539 Unspecified visual disturbance: Secondary | ICD-10-CM | POA: Diagnosis not present

## 2023-04-28 NOTE — Progress Notes (Signed)
    Procedures performed today:    None.  Independent interpretation of notes and tests performed by another provider:   None.  Brief History, Exam, Impression, and Recommendations:    Essential hypertension This is a very pleasant 72 year old female, she does have a history of hypertension well-controlled with lisinopril and furosemide. She recalls a couple of episodes of being out mowing the yard, sweating, tired, sitting down and then feeling a sensation of presyncope, no chest pain, palpitations, shortness of breath. This happened twice. It always resolves spontaneously. The second time it happened she checked her blood pressure and it was in the 60s systolic with a pulse rate of 161. I explained her this indicates dehydration, we also explained the second heart phenomenon and the importance of having a cooldown period after intense physical exertion. I also emphasized the importance of hydration with salt containing fluids before she is going to be working out in the heat, and avoiding her furosemide on days that she is going to be working outside.  Pulsatile visual field defect Eriyah is also complaining of a pulsatile visual change, she feels as though with every beat of her heart her vision goes black laterally. She does not complain of any temporal pain. Her vision is grossly normal today. Visual fields are grossly normal. EOMI. Unclear etiology, we will check some labs including ESR, I would also like a brain MRI and MRA, consultation with our optometrist downstairs. She can follow this up with PCP.   ____________________________________________ Ihor Austin. Benjamin Stain, M.D., ABFM., CAQSM., AME. Primary Care and Sports Medicine [facility]  Adjunct Professor of Family Medicine  [employer]  Restaurant manager, fast food

## 2023-04-28 NOTE — Telephone Encounter (Signed)
Patient states her BP is dropping 80/40 HR 102 after mowing the grass. Patient states this morning her BP was 94/64 HR 72. Patient states she is unsure what to do. Please advise.

## 2023-04-28 NOTE — Assessment & Plan Note (Signed)
Regina Houston is also complaining of a pulsatile visual change, she feels as though with every beat of her heart her vision goes black laterally. She does not complain of any temporal pain. Her vision is grossly normal today. Visual fields are grossly normal. EOMI. Unclear etiology, we will check some labs including ESR, I would also like a brain MRI and MRA, consultation with our optometrist downstairs. She can follow this up with PCP.

## 2023-04-28 NOTE — Assessment & Plan Note (Signed)
This is a very pleasant 72 year old female, she does have a history of hypertension well-controlled with lisinopril and furosemide. She recalls a couple of episodes of being out mowing the yard, sweating, tired, sitting down and then feeling a sensation of presyncope, no chest pain, palpitations, shortness of breath. This happened twice. It always resolves spontaneously. The second time it happened she checked her blood pressure and it was in the 60s systolic with a pulse rate of 161. I explained her this indicates dehydration, we also explained the second heart phenomenon and the importance of having a cooldown period after intense physical exertion. I also emphasized the importance of hydration with salt containing fluids before she is going to be working out in the heat, and avoiding her furosemide on days that she is going to be working outside.

## 2023-04-28 NOTE — Telephone Encounter (Signed)
Patient scheduled for today with Dr Benjamin Stain.

## 2023-04-29 LAB — COMPREHENSIVE METABOLIC PANEL
AG Ratio: 2.4 (calc) (ref 1.0–2.5)
ALT: 16 U/L (ref 6–29)
AST: 24 U/L (ref 10–35)
Albumin: 4.6 g/dL (ref 3.6–5.1)
Alkaline phosphatase (APISO): 96 U/L (ref 37–153)
BUN/Creatinine Ratio: 29 (calc) — ABNORMAL HIGH (ref 6–22)
BUN: 37 mg/dL — ABNORMAL HIGH (ref 7–25)
CO2: 25 mmol/L (ref 20–32)
Calcium: 9.7 mg/dL (ref 8.6–10.4)
Chloride: 106 mmol/L (ref 98–110)
Creat: 1.28 mg/dL — ABNORMAL HIGH (ref 0.60–1.00)
Globulin: 1.9 g/dL (calc) (ref 1.9–3.7)
Glucose, Bld: 89 mg/dL (ref 65–99)
Potassium: 4.6 mmol/L (ref 3.5–5.3)
Sodium: 141 mmol/L (ref 135–146)
Total Bilirubin: 0.5 mg/dL (ref 0.2–1.2)
Total Protein: 6.5 g/dL (ref 6.1–8.1)

## 2023-04-29 LAB — CBC
HCT: 42.2 % (ref 35.0–45.0)
Hemoglobin: 14 g/dL (ref 11.7–15.5)
MCH: 30.4 pg (ref 27.0–33.0)
MCHC: 33.2 g/dL (ref 32.0–36.0)
MCV: 91.5 fL (ref 80.0–100.0)
MPV: 9.9 fL (ref 7.5–12.5)
Platelets: 223 10*3/uL (ref 140–400)
RBC: 4.61 10*6/uL (ref 3.80–5.10)
RDW: 12.9 % (ref 11.0–15.0)
WBC: 5.7 10*3/uL (ref 3.8–10.8)

## 2023-04-29 LAB — C-REACTIVE PROTEIN: CRP: <3 mg/L (ref <8.0)

## 2023-04-29 LAB — SEDIMENTATION RATE: Sed Rate: 2 mm/h (ref 0–30)

## 2023-05-04 ENCOUNTER — Other Ambulatory Visit: Payer: Self-pay | Admitting: Family Medicine

## 2023-05-05 ENCOUNTER — Other Ambulatory Visit: Payer: Self-pay | Admitting: Family Medicine

## 2023-05-05 DIAGNOSIS — E119 Type 2 diabetes mellitus without complications: Secondary | ICD-10-CM

## 2023-05-06 ENCOUNTER — Other Ambulatory Visit: Payer: Self-pay | Admitting: Family Medicine

## 2023-05-06 DIAGNOSIS — N1831 Chronic kidney disease, stage 3a: Secondary | ICD-10-CM

## 2023-05-06 DIAGNOSIS — Z6841 Body Mass Index (BMI) 40.0 and over, adult: Secondary | ICD-10-CM

## 2023-05-06 DIAGNOSIS — I10 Essential (primary) hypertension: Secondary | ICD-10-CM

## 2023-05-06 DIAGNOSIS — E119 Type 2 diabetes mellitus without complications: Secondary | ICD-10-CM

## 2023-05-07 ENCOUNTER — Ambulatory Visit (INDEPENDENT_AMBULATORY_CARE_PROVIDER_SITE_OTHER): Payer: Medicare Other

## 2023-05-07 DIAGNOSIS — H539 Unspecified visual disturbance: Secondary | ICD-10-CM | POA: Diagnosis not present

## 2023-05-07 DIAGNOSIS — Q283 Other malformations of cerebral vessels: Secondary | ICD-10-CM

## 2023-05-17 ENCOUNTER — Encounter: Payer: Self-pay | Admitting: Family Medicine

## 2023-05-17 ENCOUNTER — Ambulatory Visit (INDEPENDENT_AMBULATORY_CARE_PROVIDER_SITE_OTHER): Payer: Medicare Other | Admitting: Family Medicine

## 2023-05-17 DIAGNOSIS — T148XXA Other injury of unspecified body region, initial encounter: Secondary | ICD-10-CM | POA: Diagnosis not present

## 2023-05-17 NOTE — Progress Notes (Signed)
   Acute Office Visit  Subjective:     Patient ID: Regina Houston, female    DOB: 23-May-1951, 72 y.o.   MRN: [id number]  No chief complaint on file.   HPI Patient is in today for large bruise on her right knee.  Does not member any specific injury or trauma.  But she does remember lying on the MRI table on June 15 and feeling like a stinging sensation in her right knee.  At the time she did not think much of it.  Then a couple days after that she was at church and felt that same stinging sensation and then when she got home later realized that there was actually a bruise over her right knee.  It has not been painful or sore.  She had fallen about 2 weeks before the MRI on that right knee but did not suffer any major injury to that knee and did not have any bruising at that time.  ROS      Objective:    There were no vitals taken for this visit.   Physical Exam Vitals reviewed.  Constitutional:      Appearance: She is well-developed.  HENT:     Head: Normocephalic and atraumatic.  Eyes:     Conjunctiva/sclera: Conjunctivae normal.  Cardiovascular:     Rate and Rhythm: Normal rate.  Pulmonary:     Effort: Pulmonary effort is normal.  Skin:    General: Skin is dry.     Coloration: Skin is not pale.  Neurological:     Mental Status: She is alert and oriented to person, place, and time.  Psychiatric:        Behavior: Behavior normal.      Right anterior knee with a very large bruise.  There is a little bit of swelling laterally and some slight increased warmth.  No erythema.  Nontender along the joint lines or directly over the bruise or the patella.  No pain with standing on that knee.   No results found for any visits on 05/17/23.      Assessment & Plan:   Problem List Items Addressed This Visit   None Visit Diagnoses     Bruising    -  Primary       Bruising on right knee-I really do not know what is caused this she does not remember any injury or trauma.  She  does have a few varicose veins so it is possible that 1 of these ruptured.  She also recently had a CBC and CMP and platelets and liver function were normal which is reassuring.  We discussed any new or worsening symptoms let us know.  No evidence of infection or cellulitis at this point in time though there is a little bit of edema and increased warmth over that right anterior knee.  No orders of the defined types were placed in this encounter.   No follow-ups on file.  Nani Gasser, MD

## 2023-06-01 DIAGNOSIS — H2513 Age-related nuclear cataract, bilateral: Secondary | ICD-10-CM | POA: Diagnosis not present

## 2023-06-04 ENCOUNTER — Other Ambulatory Visit: Payer: Self-pay | Admitting: Family Medicine

## 2023-06-04 DIAGNOSIS — Z6841 Body Mass Index (BMI) 40.0 and over, adult: Secondary | ICD-10-CM

## 2023-06-04 DIAGNOSIS — E119 Type 2 diabetes mellitus without complications: Secondary | ICD-10-CM

## 2023-06-04 DIAGNOSIS — N1831 Chronic kidney disease, stage 3a: Secondary | ICD-10-CM

## 2023-06-04 DIAGNOSIS — I10 Essential (primary) hypertension: Secondary | ICD-10-CM

## 2023-06-08 ENCOUNTER — Other Ambulatory Visit: Payer: Self-pay | Admitting: *Deleted

## 2023-06-08 NOTE — Telephone Encounter (Signed)
 Rx sent 

## 2023-06-08 NOTE — Telephone Encounter (Signed)
Pt called/ She is following up on refill.

## 2023-06-22 ENCOUNTER — Other Ambulatory Visit: Payer: Self-pay | Admitting: Sports Medicine

## 2023-06-22 DIAGNOSIS — H8110 Benign paroxysmal vertigo, unspecified ear: Secondary | ICD-10-CM | POA: Diagnosis not present

## 2023-06-22 DIAGNOSIS — H903 Sensorineural hearing loss, bilateral: Secondary | ICD-10-CM | POA: Diagnosis not present

## 2023-06-22 DIAGNOSIS — E049 Nontoxic goiter, unspecified: Secondary | ICD-10-CM | POA: Diagnosis not present

## 2023-06-22 DIAGNOSIS — H838X3 Other specified diseases of inner ear, bilateral: Secondary | ICD-10-CM | POA: Diagnosis not present

## 2023-06-22 IMAGING — MG MM DIGITAL SCREENING BILAT W/ TOMO AND CAD
8 series · 8 of 24 positions shown · non-contrast
Comparison: Previous exam(s).

CLINICAL DATA: Screening.

EXAM:
DIGITAL SCREENING BILATERAL MAMMOGRAM WITH TOMOSYNTHESIS AND CAD
TECHNIQUE: Bilateral screening digital craniocaudal and mediolateral oblique
mammograms were obtained. Bilateral screening digital breast
tomosynthesis was performed. The images were evaluated with
computer-aided detection.

[L MLO synth-2D]
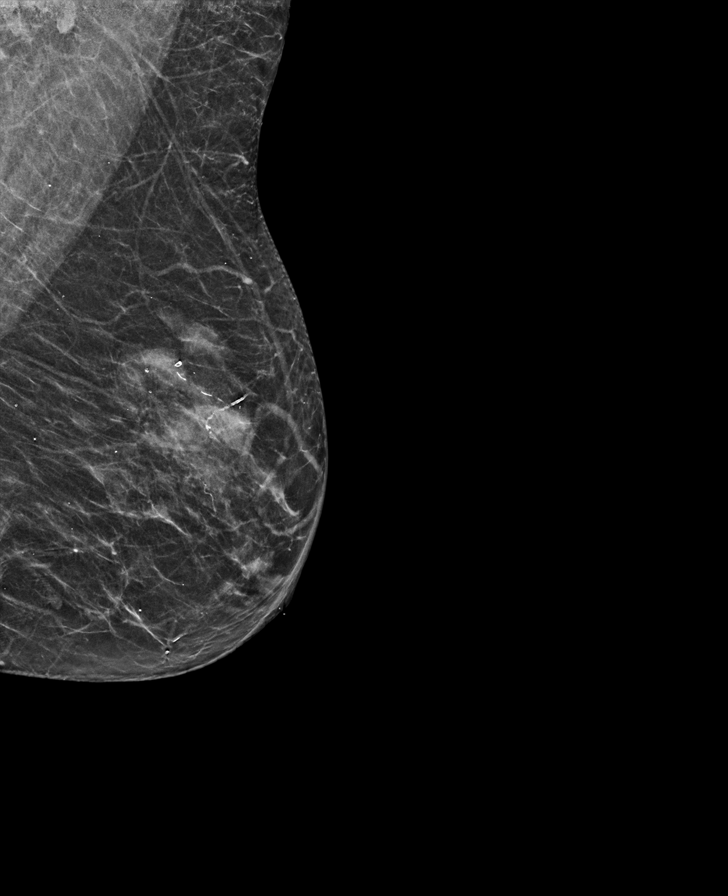

[R CC synth-2D]
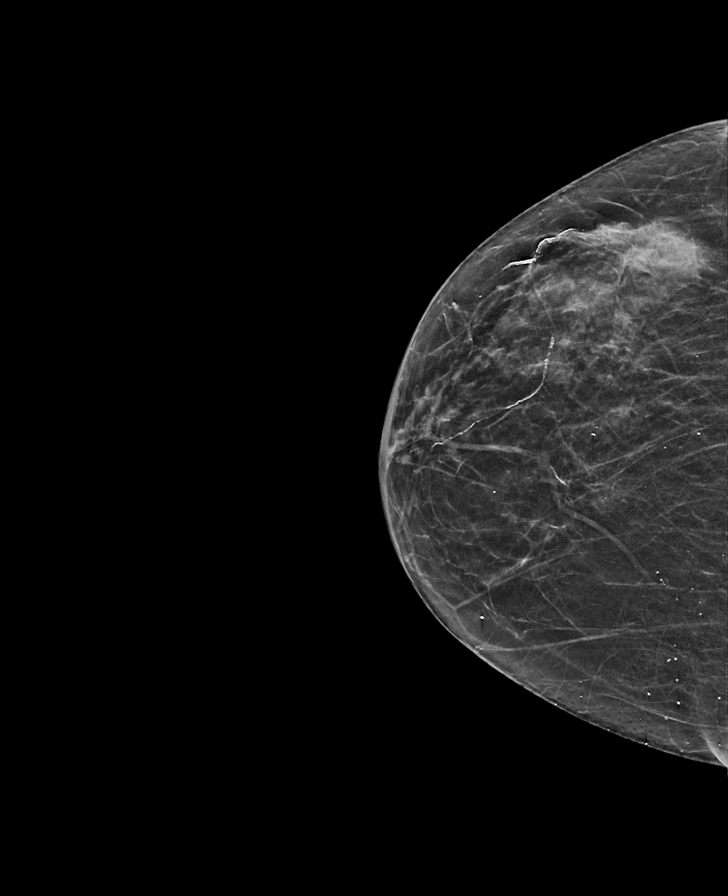

[R MLO synth-2D]
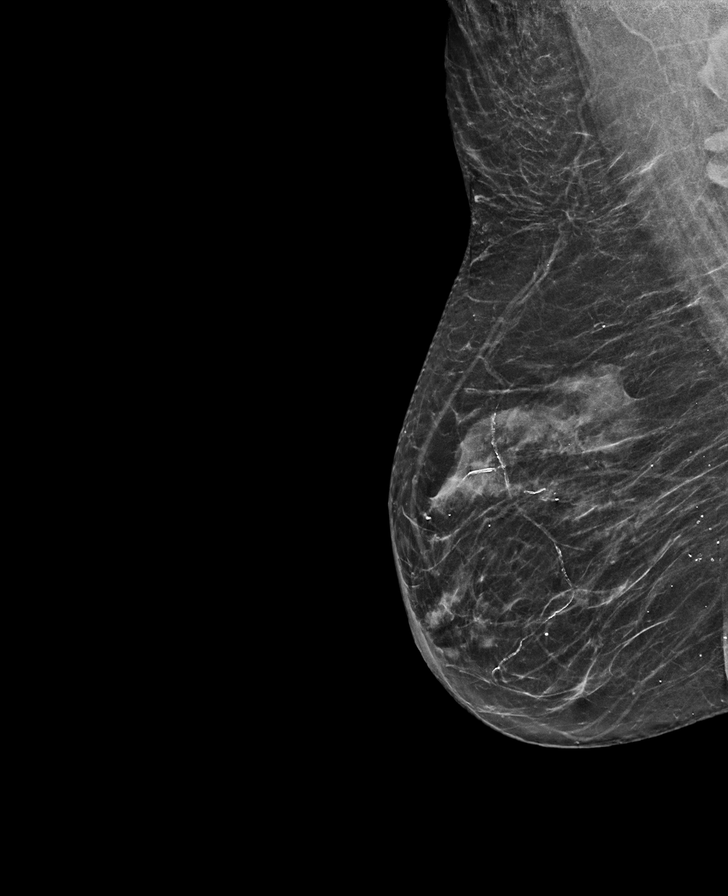

[L CC synth-2D]
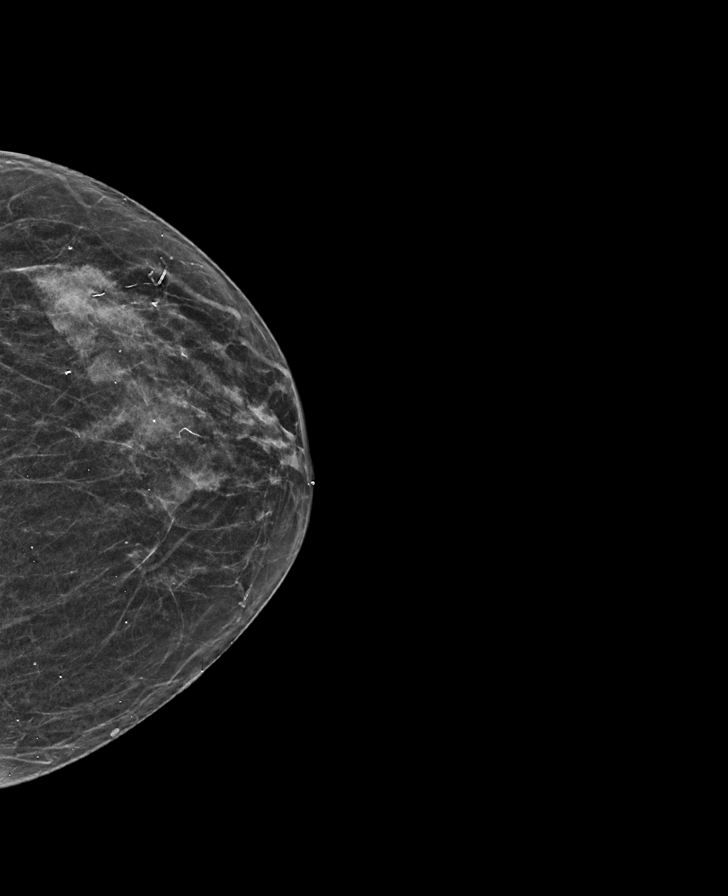

[L CC tomo · tomo slice 25/48.0]
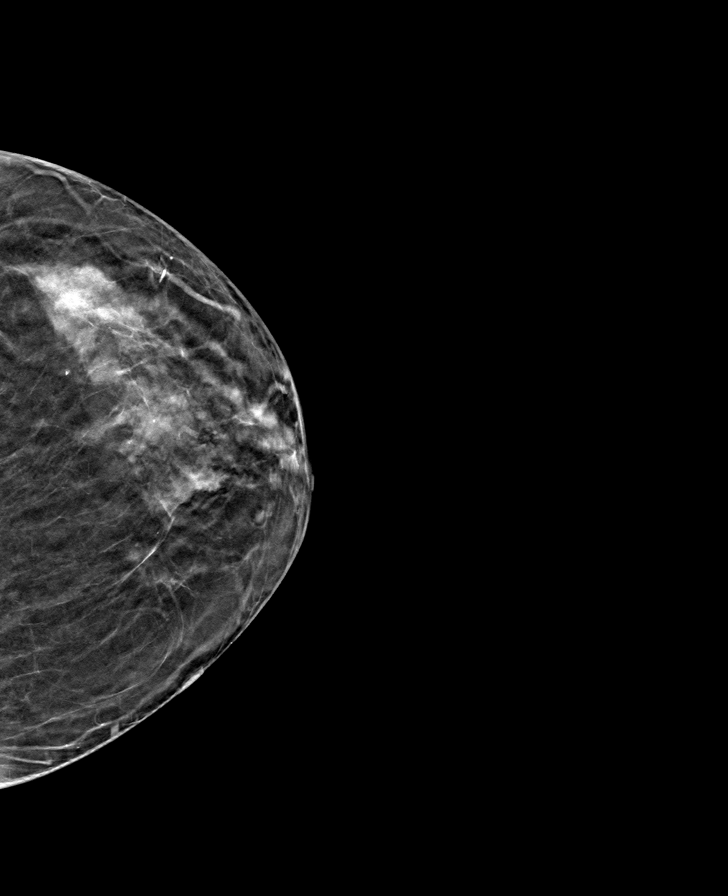

[L MLO tomo · tomo slice 29/57.0]
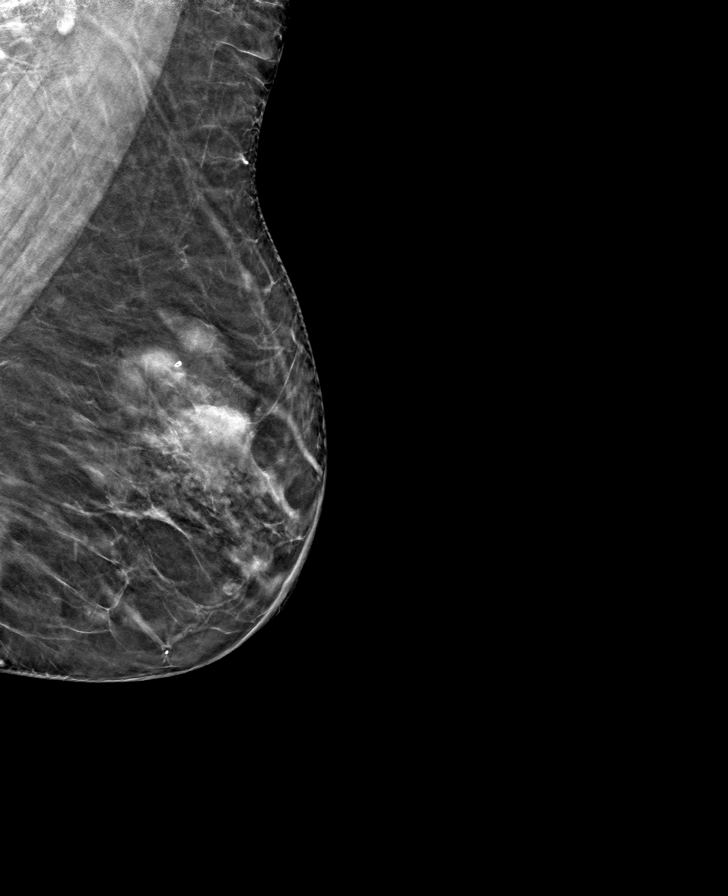

[R MLO tomo · tomo slice 31/61.0]
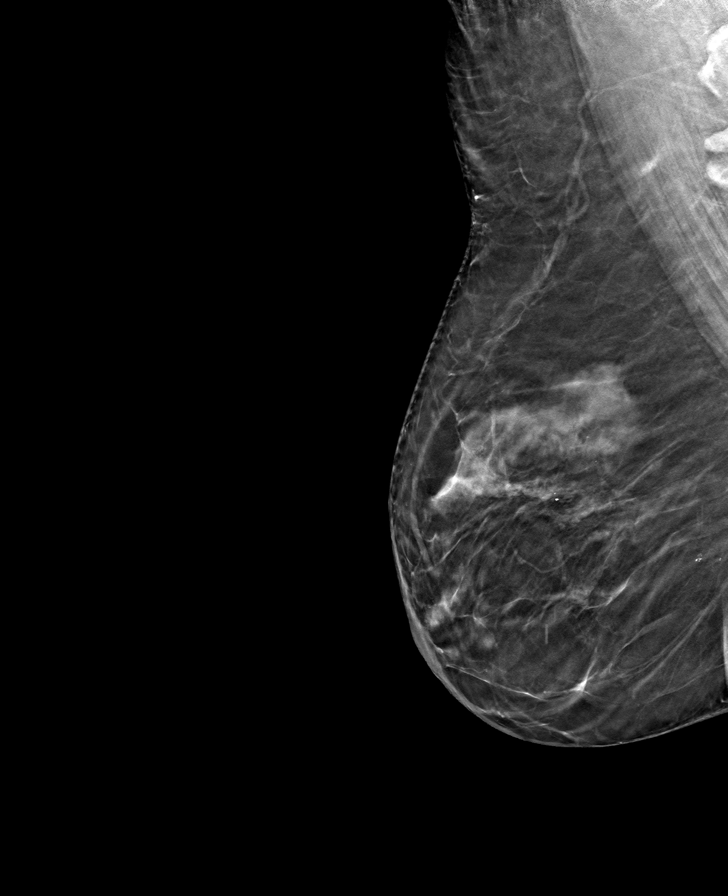

[R CC tomo · tomo slice 25/49.0]
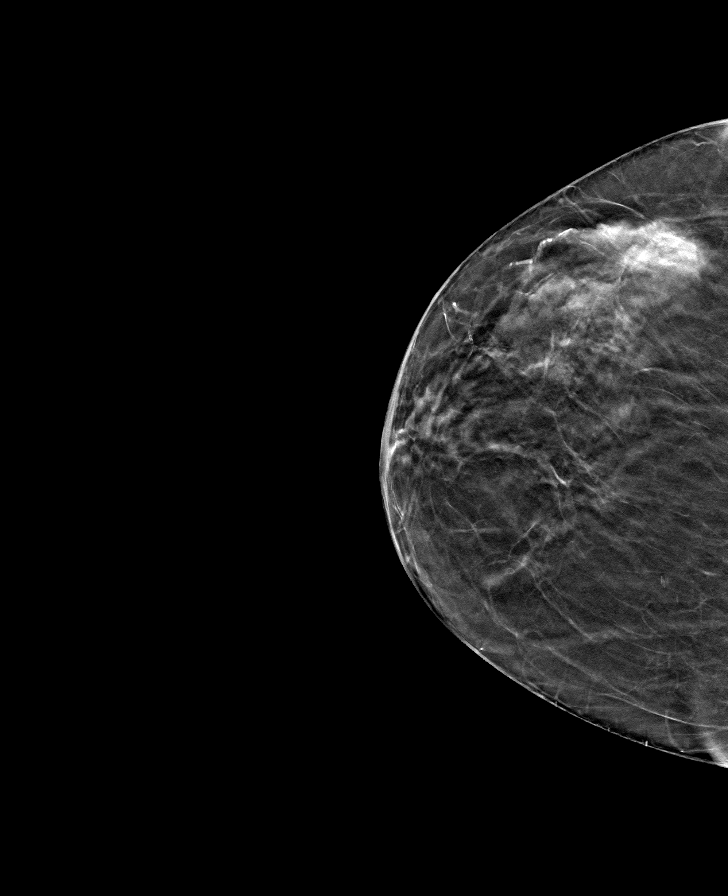

[8 of 24 positions shown; findings below may reference images not displayed]

ACR Breast Density Category b: There are scattered areas of
fibroglandular density.
FINDINGS: There are no findings suspicious for malignancy.
IMPRESSION: No mammographic evidence of malignancy. A result letter of this
screening mammogram will be mailed directly to the patient.

RECOMMENDATION:
Screening mammogram in one year. (Code:51-O-LD2)

BI-RADS CATEGORY  1: Negative.

## 2023-06-24 ENCOUNTER — Telehealth: Payer: Self-pay | Admitting: Family Medicine

## 2023-06-24 NOTE — Telephone Encounter (Signed)
Pt called stating that she saw a Dr.Fipps for her hearing. He suggested that she get a saunogram of her thyroid. She stated that she has an appt at [facility] for a saunogram that was ordered by Dr.Fipps. She's not sure if she should keep this appt or go through [facility] for her saunogram. She is asking if someone could please call her.

## 2023-06-27 NOTE — Telephone Encounter (Signed)
Regina Houston states she will stay with [facility].

## 2023-06-27 NOTE — Telephone Encounter (Signed)
We can do either way. We can schedule in our building if she would like but OK to keep the appt with [facility] if would like

## 2023-06-29 DIAGNOSIS — E049 Nontoxic goiter, unspecified: Secondary | ICD-10-CM | POA: Diagnosis not present

## 2023-07-01 ENCOUNTER — Other Ambulatory Visit: Payer: Self-pay | Admitting: Family Medicine

## 2023-07-01 DIAGNOSIS — E119 Type 2 diabetes mellitus without complications: Secondary | ICD-10-CM

## 2023-07-01 DIAGNOSIS — N1831 Chronic kidney disease, stage 3a: Secondary | ICD-10-CM

## 2023-07-01 DIAGNOSIS — I10 Essential (primary) hypertension: Secondary | ICD-10-CM

## 2023-07-01 DIAGNOSIS — Z6841 Body Mass Index (BMI) 40.0 and over, adult: Secondary | ICD-10-CM

## 2023-07-01 MED ORDER — TIRZEPATIDE 7.5 MG/0.5ML ~~LOC~~ SOAJ: 7.5000 mg | SUBCUTANEOUS | 1 refills | Status: DC

## 2023-07-01 NOTE — Telephone Encounter (Signed)
Does want to stay on the same dose or does she want to go up to the 7.5 mg?

## 2023-07-01 NOTE — Telephone Encounter (Signed)
Meds ordered this encounter  Medications   tirzepatide (MOUNJARO) 7.5 MG/0.5ML Pen    Sig: Inject 7.5 mg into the skin once a week.    Dispense:  2 mL    Refill:  1

## 2023-07-16 ENCOUNTER — Other Ambulatory Visit: Payer: Self-pay | Admitting: Family Medicine

## 2023-07-18 ENCOUNTER — Encounter: Payer: Self-pay | Admitting: Family Medicine

## 2023-07-18 ENCOUNTER — Other Ambulatory Visit: Payer: Self-pay | Admitting: Family Medicine

## 2023-07-18 ENCOUNTER — Ambulatory Visit (INDEPENDENT_AMBULATORY_CARE_PROVIDER_SITE_OTHER): Payer: Medicare Other | Admitting: Family Medicine

## 2023-07-18 VITALS — BP 115/54 | HR 62 | Ht 64.0 in | Wt 271.0 lb

## 2023-07-18 DIAGNOSIS — I10 Essential (primary) hypertension: Secondary | ICD-10-CM

## 2023-07-18 DIAGNOSIS — E042 Nontoxic multinodular goiter: Secondary | ICD-10-CM

## 2023-07-18 DIAGNOSIS — Z23 Encounter for immunization: Secondary | ICD-10-CM | POA: Diagnosis not present

## 2023-07-18 DIAGNOSIS — N1831 Chronic kidney disease, stage 3a: Secondary | ICD-10-CM | POA: Diagnosis not present

## 2023-07-18 DIAGNOSIS — E119 Type 2 diabetes mellitus without complications: Secondary | ICD-10-CM | POA: Diagnosis not present

## 2023-07-18 DIAGNOSIS — E039 Hypothyroidism, unspecified: Secondary | ICD-10-CM

## 2023-07-18 LAB — POCT UA - MICROALBUMIN
Albumin/Creatinine Ratio, Urine, POC: <30
Creatinine, POC: 10 mg/dL
Microalbumin Ur, POC: 10 mg/L

## 2023-07-18 LAB — POCT GLYCOSYLATED HEMOGLOBIN (HGB A1C): Hemoglobin A1C: 5.6 % (ref 4.0–5.6)

## 2023-07-18 NOTE — Progress Notes (Unsigned)
Established Patient Office Visit  Subjective   Patient ID: Regina Houston, female    DOB: 1951-06-17  Age: 72 y.o. MRN: [id number]  Chief Complaint  Patient presents with   Diabetes         HPI  Diabetes - no hypoglycemic events. No wounds or sores that are not healing well. No increased thirst or urination. Checking glucose at home. Taking medications as prescribed without any side effects. She id down 3 more lbs.    F/U CKD - 3 -no recent changes.  Hypertension- Pt denies chest pain, SOB, dizziness, or heart palpitations.  Taking meds as directed w/o problems.  Denies medication side effects.    She also wanted to let me know that she recently had an ultrasound on her thyroid she had gone to see Dr. Marisa Sprinkles her ENT and he had noted that her thyroid gland felt more nodular.  They found 4 nodules and they have scheduled her for fine-needle aspiration biopsy on 2 of them.    Let me know that she had her hearing tested again lately and everything was stable which is great.  She also ended up following up with Dr. Marisa Sprinkles.    ROS    Objective:     BP (!) 115/54   Pulse 62   Ht 5\' 4"  (1.626 m)   Wt 271 lb (122.9 kg)   SpO2 97%   BMI 46.52 kg/m    Physical Exam Vitals and nursing note reviewed.  Constitutional:      Appearance: Normal appearance.  HENT:     Head: Normocephalic and atraumatic.  Eyes:     Conjunctiva/sclera: Conjunctivae normal.  Neck:     Comments: Thyromegaly with a nodule palpable on the left lower lobe.  Cardiovascular:     Rate and Rhythm: Normal rate and regular rhythm.  Pulmonary:     Effort: Pulmonary effort is normal.     Breath sounds: Normal breath sounds.  Musculoskeletal:     Cervical back: Neck supple.  Skin:    General: Skin is warm and dry.  Neurological:     Mental Status: She is alert.  Psychiatric:        Mood and Affect: Mood normal.      Results for orders placed or performed in visit on 07/18/23  POCT HgB A1C  Result  Value Ref Range   Hemoglobin A1C 5.6 4.0 - 5.6 %   HbA1c POC (<> result, manual entry)     HbA1c, POC (prediabetic range)     HbA1c, POC (controlled diabetic range)    POCT UA - Microalbumin  Result Value Ref Range   Microalbumin Ur, POC 10 mg/L   Creatinine, POC 10 mg/dL   Albumin/Creatinine Ratio, Urine, POC <30       The 10-year ASCVD risk score (Arnett DK, et al., 2019) is: 27.6%    Assessment & Plan:   Problem List Items Addressed This Visit       Cardiovascular and Mediastinum   Essential hypertension    Pressures are running a little lower after starting Mounjaro.  Will have her go ahead and cut her blood pressure pill in half and monitor.  Call if any concerns her blood pressures are going too high.        Endocrine   Multiple thyroid nodules    Is scheduled for biopsy      Diabetes mellitus (HCC) - Primary    1C looks great today at 5.6.  Even better than  last time.  Continue to work on [employer] and regular Bason activity as she is able to tolerate.  She had just increase the Mounjaro to 7.5 mg about 2 weeks ago so we will probably stick with that for about 2 months but then okay to go up to 10 mg if desired at that point.  Metformin and discontinued.      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      Other Visit Diagnoses     Encounter for immunization       Relevant Orders   Flu Vaccine Trivalent High Dose (Fluad) (Completed)       Return in about 3 months (around 10/18/2023) for Diabetes follow-up, and cholesterol check .    Nani Gasser, MD

## 2023-07-18 NOTE — Patient Instructions (Signed)
Ok to cut the lisinopril in half.

## 2023-07-19 DIAGNOSIS — E042 Nontoxic multinodular goiter: Secondary | ICD-10-CM | POA: Insufficient documentation

## 2023-07-19 NOTE — Assessment & Plan Note (Signed)
Is scheduled for biopsy

## 2023-07-19 NOTE — Assessment & Plan Note (Addendum)
1C looks great today at 5.6.  Even better than last time.  Continue to work on [employer] and regular Bason activity as she is able to tolerate.  She had just increase the Mounjaro to 7.5 mg about 2 weeks ago so we will probably stick with that for about 2 months but then okay to go up to 10 mg if desired at that point.  Metformin and discontinued.

## 2023-07-19 NOTE — Assessment & Plan Note (Signed)
Pressures are running a little lower after starting Mounjaro.  Will have her go ahead and cut her blood pressure pill in half and monitor.  Call if any concerns her blood pressures are going too high.

## 2023-07-19 NOTE — Progress Notes (Signed)
Hi Regina Houston, no excess protein in the urine which is great.  An A1c looks phenomenal at 5.6.

## 2023-07-19 NOTE — Assessment & Plan Note (Signed)
Continue to follow renal function every 6 months. 

## 2023-07-20 ENCOUNTER — Other Ambulatory Visit: Payer: Self-pay | Admitting: Family Medicine

## 2023-07-31 ENCOUNTER — Other Ambulatory Visit: Payer: Self-pay | Admitting: Family Medicine

## 2023-07-31 DIAGNOSIS — E119 Type 2 diabetes mellitus without complications: Secondary | ICD-10-CM

## 2023-08-08 ENCOUNTER — Other Ambulatory Visit: Payer: Self-pay | Admitting: Family Medicine

## 2023-08-08 DIAGNOSIS — M47816 Spondylosis without myelopathy or radiculopathy, lumbar region: Secondary | ICD-10-CM

## 2023-08-23 HISTORY — PX: BIOPSY THYROID: PRO38

## 2023-08-28 ENCOUNTER — Other Ambulatory Visit: Payer: Self-pay | Admitting: Family Medicine

## 2023-09-01 DIAGNOSIS — Z23 Encounter for immunization: Secondary | ICD-10-CM | POA: Diagnosis not present

## 2023-09-13 DIAGNOSIS — B0052 Herpesviral keratitis: Secondary | ICD-10-CM | POA: Diagnosis not present

## 2023-09-13 DIAGNOSIS — H2513 Age-related nuclear cataract, bilateral: Secondary | ICD-10-CM | POA: Diagnosis not present

## 2023-09-13 DIAGNOSIS — E119 Type 2 diabetes mellitus without complications: Secondary | ICD-10-CM | POA: Diagnosis not present

## 2023-09-13 DIAGNOSIS — H05242 Constant exophthalmos, left eye: Secondary | ICD-10-CM | POA: Diagnosis not present

## 2023-09-13 DIAGNOSIS — H04123 Dry eye syndrome of bilateral lacrimal glands: Secondary | ICD-10-CM | POA: Diagnosis not present

## 2023-09-13 DIAGNOSIS — H35371 Puckering of macula, right eye: Secondary | ICD-10-CM | POA: Diagnosis not present

## 2023-09-13 LAB — HM DIABETES EYE EXAM

## 2023-09-20 DIAGNOSIS — E042 Nontoxic multinodular goiter: Secondary | ICD-10-CM | POA: Diagnosis not present

## 2023-09-21 DIAGNOSIS — E042 Nontoxic multinodular goiter: Secondary | ICD-10-CM | POA: Diagnosis not present

## 2023-10-04 ENCOUNTER — Ambulatory Visit (INDEPENDENT_AMBULATORY_CARE_PROVIDER_SITE_OTHER): Payer: Medicare Other | Admitting: Family Medicine

## 2023-10-04 DIAGNOSIS — Z0000 Encounter for general adult medical examination without abnormal findings: Secondary | ICD-10-CM | POA: Diagnosis not present

## 2023-10-04 NOTE — Progress Notes (Signed)
MEDICARE ANNUAL WELLNESS VISIT  10/04/2023  Telephone Visit Disclaimer This Medicare AWV was conducted by telephone due to national recommendations for restrictions regarding the COVID-19 Pandemic (e.g. social distancing).  I verified, using two identifiers, that I am speaking with Regina Houston or their authorized healthcare agent. I discussed the limitations, risks, security, and privacy concerns of performing an evaluation and management service by telephone and the potential availability of an in-person appointment in the future. The patient expressed understanding and agreed to proceed.  Location of Patient: Home Location of Provider (nurse):  In the office.  Subjective:    Regina Houston is a 72 y.o. female patient of Metheney, Barbarann Ehlers, MD who had a Medicare Annual Wellness Visit today via telephone. Regina Houston is Retired and lives alone. she does not have any children. she reports that she is socially active and does interact with friends/family regularly. she is moderately physically active and enjoys crossword puzzles and playing the piano.  Patient Care Team: Agapito Games, MD as PCP - General Gabriel Carina, [facility] as Pharmacist (Pharmacist)     10/04/2023    8:55 AM 10/01/2022    9:03 AM 09/23/2021    9:05 AM 10/21/2020    9:36 AM 09/22/2020    9:04 AM 09/18/2019    9:09 AM 09/12/2018    9:03 AM  Advanced Directives  Does Patient Have a Medical Advance Directive? Yes Yes Yes Yes Yes Yes Yes  Type of Advance Directive Living will Living will Healthcare Power of [place];Living will Healthcare Power of [place];Living will Healthcare Power of [place];Living will;Out of facility DNR (pink MOST or yellow form) Healthcare Power of [place];Living will Healthcare Power of [place];Living will  Does patient want to make changes to medical advance directive? No - Patient declined No - Patient declined No - Patient declined No - Patient declined No - Patient declined No -  Patient declined No - Patient declined  Copy of Healthcare Power of Attorney in Chart?   Yes - validated most recent copy scanned in chart (See row information) No - copy requested Yes - validated most recent copy scanned in chart (See row information) No - copy requested No - copy requested    Hospital Utilization Over the Past 12 Months: # of hospitalizations or ER visits: 0 # of surgeries: 0  Review of Systems    Patient reports that her overall health is unchanged compared to last year.  History obtained from chart review and the patient  Patient Reported Readings (BP, Pulse, CBG, Weight, etc) CBG 107 Per patient no change in vitals since last visit, unable to obtain new vitals due to telehealth visit  Pain Assessment Pain : No/denies pain     Current Medications & Allergies (verified) Allergies as of 10/04/2023       Reactions   Latex Rash   Tape Rash        Medication List        Accurate as of October 04, 2023  9:05 AM. If you have any questions, ask your nurse or doctor.          AMBULATORY NON FORMULARY MEDICATION Medication Name: Glucometer.  STrips and lancets to test twice daily.   Dx 250.00.   Apple Cider Vinegar Diet Tabs Take 1 tablet by mouth daily.   calcium carbonate 600 MG Tabs tablet Commonly known as: OS-CAL Take 600 mg by mouth daily.   celecoxib 200 MG capsule Commonly known as: CELEBREX TAKE 1 TO 2 CAPSULES  BY MOUTH ONCE DAILY AS NEEDED FOR PAIN   fish oil-omega-3 fatty acids 1000 MG capsule Take 2 g by mouth daily.   furosemide 20 MG tablet Commonly known as: LASIX Take 1 tablet by mouth once daily   levothyroxine 100 MCG tablet Commonly known as: SYNTHROID Take 1 tablet by mouth once daily   lisinopril 20 MG tablet Commonly known as: ZESTRIL TAKE 1 TABLET BY MOUTH ONCE DAILY . APPOINTMENT REQUIRED FOR FUTURE REFILLS   Mounjaro 7.5 MG/0.5ML Pen Generic drug: tirzepatide INJECT 1 SYRINGE SUBCUTANEOUSLY ONCE A WEEK    OneTouch Ultra test strip Generic drug: glucose blood USE 1 STRIP TO CHECK GLUCOSE ONCE DAILY   pregabalin 75 MG capsule Commonly known as: LYRICA TAKE 1 CAPSULE BY MOUTH THREE TIMES DAILY   simvastatin 20 MG tablet Commonly known as: ZOCOR TAKE 1 TABLET BY MOUTH AT BEDTIME   Thera Tabs Take 1 tablet by mouth daily.        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,benign menigioma   Capsulotomy MPJ Right 12/31/2016   RT FOOT, 3rd MPJ   FOOT SURGERY  2004   hammer toe   Hammer Toe Repair Right 12/31/2016   RT #3, #5 toes   Family History  Problem Relation Age of Onset   Hypertension Mother    Stroke Mother    Diabetes Mother    Heart disease Mother 26   Arthritis Mother    Kidney disease Father        kidney failure   Heart disease Father 70   Heart disease Maternal Grandmother        MI   Diabetes Maternal Grandmother    Diabetes Paternal Grandmother    Heart disease Paternal Grandmother    Social History   Socioeconomic History   Marital status: Single    Spouse name: Not on file   Number of children: 0   Years of education: BA   Highest education level: Bachelor's degree (e.g., BA, AB, BS)  Occupational History   Occupation: retired    Comment: [employer]  Tobacco Use   Smoking status: Never   Smokeless tobacco: Never  Vaping Use   Vaping status: Never Used  Substance and Sexual Activity   Alcohol use: No   Drug use: No   Sexual activity: Not Currently    Birth control/protection: None  Other Topics Concern   Not on file  Social History Narrative    Lives alone with her dog. She enjoys doing crossword puzzles and playing the piano. She has a dog and walks her dog multiple times a day. She has a sister who lives in [place].    Social Determinants of Health   Financial Resource Strain: Low Risk  (09/30/2023)   Overall Financial Resource Strain (CARDIA)    Difficulty of Paying Living Expenses: Not very hard  Food Insecurity: No Food Insecurity (09/30/2023)   Hunger Vital Sign    Worried About Running Out of Food in the Last  Year: Never true    Ran Out of Food in the Last Year: Never true  Transportation Needs: No Transportation Needs (09/30/2023)   PRAPARE - Administrator, Civil Service (Medical): No    Lack of Transportation (Non-Medical): No  Physical Activity: Sufficiently Active (09/30/2023)   Exercise Vital Sign    Days of Exercise per Week: 7 days    Minutes of Exercise per Session: 60 min  Stress: No Stress Concern Present (09/30/2023)   [employer] of Occupational Health - Occupational Stress Questionnaire    Feeling of Stress : Only a little  Social Connections: Moderately Integrated (10/04/2023)   Social Connection and Isolation Panel [NHANES]    Frequency of Communication with Friends and Family: More than three times a week    Frequency of Social Gatherings with Friends and Family: Twice a week    Attends Religious Services: More than 4 times per year    Active Member of [employer] or Organizations: Yes    Attends Banker Meetings: More than 4 times per year    Marital Status: Never married    Activities of Daily Living    10/04/2023    8:59 AM 09/30/2023    8:52 AM  In your present state of health, do you have any difficulty performing the following activities:  Hearing?  0  Vision? 0   Difficulty concentrating or making decisions?  0  Walking or climbing stairs?  0  Dressing or bathing?  0  Doing errands, shopping?  0  Preparing Food and eating ? N   Using the Toilet?  N  In the  past six months, have you accidently leaked urine? N   Do you have problems with loss of bowel control?  N  Managing your Medications?  N  Managing your Finances?  N  Housekeeping or managing your Housekeeping?  N    Patient Education/ Literacy How often do you need to have someone help you when you read instructions, pamphlets, or other written materials from your doctor or pharmacy?: 1 - Never What is the last grade level you completed in school?: Bachelor's degree  Exercise    Diet Patient reports consuming 3 meals a day and 1 snack(s) a day Patient reports that her primary diet is: Regular Patient reports that she does have regular access to food.   Depression Screen    10/04/2023    8:55 AM 03/17/2023   10:02 AM 10/01/2022    9:04 AM 09/16/2022   11:03 AM 01/19/2022    9:47 AM 09/23/2021    9:06 AM 09/22/2020    9:05 AM  PHQ 2/9 Scores  PHQ - 2 Score 1 0 0 0 0 0 0     Fall Risk    10/04/2023    8:55 AM 09/30/2023    8:52 AM 07/18/2023    9:58 AM 03/17/2023   10:02 AM 10/01/2022    9:05 AM  Fall Risk   Falls in the past year? 1 1 1 1 1   Number falls in past yr: 0 0 0 0 1  Injury with Fall? 0 0 0 0 0  Risk for fall due to : History of fall(s)  No Fall Risks Other (Comment) History of fall(s)  Risk for fall due to: Comment    dog pulled her   Follow up Falls evaluation completed;Education provided;Falls prevention discussed  Falls evaluation completed Falls evaluation completed Falls evaluation completed;Education provided;Falls prevention discussed     Objective:  Regina Houston seemed alert and oriented and she participated appropriately during our telephone visit.  Blood Pressure Weight BMI  BP Readings from Last 3 Encounters:  07/18/23 (!) 115/54  04/28/23 136/77  03/17/23 (!) 121/52   Wt Readings from Last 3 Encounters:  07/18/23 271 lb (122.9 kg)  03/17/23 274 lb (124.3 kg)  09/16/22 265 lb (120.2 kg)   BMI Readings from Last 1 Encounters:  07/18/23 46.52  kg/m    *Unable to obtain current vital signs, weight, and BMI due to telephone visit type  Hearing/Vision  Regina Houston did not seem to have difficulty with hearing/understanding during the telephone conversation Reports that she has had a formal eye exam by an eye care professional within the past year Reports that she has had a formal hearing evaluation within the past year *Unable to fully assess hearing and vision during telephone visit type  Cognitive Function:    10/04/2023    9:01 AM 10/01/2022    9:10 AM 09/23/2021    9:17 AM 09/22/2020    9:06 AM 09/18/2019    9:14 AM  6CIT Screen  What Year? 0 points 0 points 0 points 0 points 0 points  What month? 0 points 0 points 0 points 0 points 0 points  What time? 0 points 0 points 0 points 0 points 0 points  Count back from 20 0 points 0 points 0 points 0 points 0 points  Months in reverse 0 points 0 points 0 points 0 points 0 points  Repeat phrase 0 points 0 points 0 points 0 points 0 points  Total Score 0 points 0 points 0 points 0 points 0 points   (Normal:0-7, Significant for Dysfunction: >8)  Normal Cognitive Function Screening: Yes   Immunization & Health Maintenance Record Immunization History  Administered Date(s) Administered   DTP 11/22/2006   Fluad Quad(high Dose 65+) 07/25/2019, 08/20/2020   Fluad Trivalent(High Dose 65+) 07/18/2023   Influenza Split 10/10/2012   Influenza Whole 11/22/2006, 10/01/2009, 08/22/2010   Influenza, High Dose Seasonal PF 09/02/2018, 08/14/2021, 09/02/2022   Influenza,inj,Quad PF,6+ Mos 08/28/2014, 08/26/2015, 08/10/2016   Influenza-Unspecified 10/01/2013, 09/02/2017, 09/02/2018   Moderna SARS-COV2 Booster Vaccination 04/28/2021   Moderna Sars-Covid-2 Vaccination 12/18/2019, 01/18/2020, 09/13/2020   PNEUMOCOCCAL CONJUGATE-20 09/16/2022   Pfizer Covid-19 Vaccine Bivalent Booster 8yrs & up 08/14/2021, 09/02/2022   Pfizer(Comirnaty)Fall Seasonal Vaccine 12 years and older 09/01/2023    Pneumococcal Conjugate-13 02/19/2014   Pneumococcal Polysaccharide-23 11/22/2005, 04/03/2015   Td 11/22/2006   Tdap 02/22/2017   Zoster Recombinant(Shingrix) 01/23/2019, 03/20/2019   Zoster, Live 05/04/2011    Health Maintenance  Topic Date Due   Medicare Annual Wellness (AWV)  10/02/2023   FOOT EXAM  10/16/2023 (Originally 05/19/2023)   COVID-19 Vaccine (7 - 2023-24 season) 10/27/2023   HEMOGLOBIN A1C  01/18/2024   Diabetic kidney evaluation - eGFR measurement  04/27/2024   Diabetic kidney evaluation - Urine ACR  07/17/2024   DEXA SCAN  07/24/2024   OPHTHALMOLOGY EXAM  09/12/2024   Colonoscopy  09/30/2024   MAMMOGRAM  11/17/2024   DTaP/Tdap/Td (4 - Td or Tdap) 02/23/2027   Pneumonia Vaccine 17+ Years old  Completed   INFLUENZA VACCINE  Completed   Hepatitis C Screening  Completed   Zoster Vaccines- Shingrix  Completed   HPV VACCINES  Aged Out       Assessment  This is a routine wellness examination for [place].  Health Maintenance: Due or Overdue Health Maintenance Due  Topic Date Due   Medicare Annual Wellness (  AWV)  10/02/2023    Regina Houston does not need a referral for Community Assistance: Care Management:   no Social Work:    no Prescription Assistance:  no Nutrition/Diabetes Education:  no   Plan:  Personalized Goals  Goals Addressed               This Visit's Progress     Patient Stated (pt-stated)        Patient stated that she would like to continue to work on losing weight.       Personalized Health Maintenance & Screening Recommendations  Foot exam Mammogram - patient said that she will schedule it  Lung Cancer Screening Recommended: no (Low Dose CT Chest recommended if Age 35-80 years, 20 pack-year currently smoking OR have quit w/in past 15 years) Hepatitis C Screening recommended: no HIV Screening recommended: no  Advanced Directives: Written information was not prepared per patient's request.  Referrals & Orders No orders of  the defined types were placed in this encounter.   Follow-up Plan Follow-up with Agapito Games, MD as planned Schedule mammogram in December.  Foot exam can be done at the next visit with PCP.  Medicare wellness visit in one year.  Patient will access AVS on my chart.   I have personally reviewed and noted the following in the patient's chart:   Medical and social history Use of alcohol, tobacco or illicit drugs  Current medications and supplements Functional ability and status Nutritional status Physical activity Advanced directives List of other physicians Hospitalizations, surgeries, and ER visits in previous 12 months Vitals Screenings to include cognitive, depression, and falls Referrals and appointments  In addition, I have reviewed and discussed with Regina Houston certain preventive protocols, quality metrics, and best practice recommendations. A written personalized care plan for preventive services as well as general preventive health recommendations is available and can be mailed to the patient at her request.      Modesto Charon, RN BSN  10/04/2023

## 2023-10-04 NOTE — Patient Instructions (Addendum)
MEDICARE ANNUAL WELLNESS VISIT Health Maintenance Summary and Written Plan of Care  [name] ,  Thank you for allowing me to perform your Medicare Annual Wellness Visit and for your ongoing commitment to your health.   Health Maintenance & Immunization History Health Maintenance  Topic Date Due  . FOOT EXAM  10/16/2023 (Originally 05/19/2023)  . COVID-19 Vaccine (7 - 2023-24 season) 10/27/2023  . HEMOGLOBIN A1C  01/18/2024  . Diabetic kidney evaluation - eGFR measurement  04/27/2024  . Diabetic kidney evaluation - Urine ACR  07/17/2024  . DEXA SCAN  07/24/2024  . OPHTHALMOLOGY EXAM  09/12/2024  . Colonoscopy  09/30/2024  . Medicare Annual Wellness (AWV)  10/03/2024  . MAMMOGRAM  11/17/2024  . DTaP/Tdap/Td (4 - Td or Tdap) 02/23/2027  . Pneumonia Vaccine 9+ Years old  Completed  . INFLUENZA VACCINE  Completed  . Hepatitis C Screening  Completed  . Zoster Vaccines- Shingrix  Completed  . HPV VACCINES  Aged Out   Immunization History  Administered Date(s) Administered  . DTP 11/22/2006  . Fluad Quad(high Dose 65+) 07/25/2019, 08/20/2020  . Fluad Trivalent(High Dose 65+) 07/18/2023  . Influenza Split 10/10/2012  . Influenza Whole 11/22/2006, 10/01/2009, 08/22/2010  . Influenza, High Dose Seasonal PF 09/02/2018, 08/14/2021, 09/02/2022  . Influenza,inj,Quad PF,6+ Mos 08/28/2014, 08/26/2015, 08/10/2016  . Influenza-Unspecified 10/01/2013, 09/02/2017, 09/02/2018  . Moderna SARS-COV2 Booster Vaccination 04/28/2021  . Moderna Sars-Covid-2 Vaccination 12/18/2019, 01/18/2020, 09/13/2020  . PNEUMOCOCCAL CONJUGATE-20 09/16/2022  . [employer] officer, trade [employer] 15yrs & up 08/14/2021, 09/02/2022  . Pfizer(Comirnaty)Fall Seasonal Vaccine 12 years and older 09/01/2023  . Pneumococcal Conjugate-13 02/19/2014  . Pneumococcal Polysaccharide-23 11/22/2005, 04/03/2015  . Td 11/22/2006  . Tdap 02/22/2017  . Zoster Recombinant(Shingrix) 01/23/2019, 03/20/2019  . Zoster, Live  05/04/2011    These are the patient goals that we discussed:  Goals Addressed              This Visit's Progress   .  Patient Stated (pt-stated)        Patient stated that she would like to continue to work on losing weight.        This is a list of Health Maintenance Items that are overdue or due now: Foot exam Mammogram - patient said that she will schedule it  Orders/Referrals Placed Today: No orders of the defined types were placed in this encounter.  (Contact our referral department at [phone] if you have not spoken with someone about your referral appointment within the next 5 days)    Follow-up Plan Follow-up with Agapito Games, MD as planned Schedule mammogram in December.  Foot exam can be done at the next visit with PCP.  Medicare wellness visit in one year.  Patient will access AVS on my chart.      Health Maintenance, Female Adopting a healthy lifestyle and getting preventive care are important in promoting health and wellness. Ask your health care provider about: The right schedule for you to have regular tests and exams. Things you can do on your own to prevent diseases and keep yourself healthy. What should I know about diet, weight, and exercise? Eat a healthy diet  Eat a diet that includes plenty of vegetables, fruits, low-fat dairy products, and lean protein. Do not eat a lot of foods that are high in solid fats, added sugars, or sodium. Maintain a healthy weight Body mass index (BMI) is used to identify weight problems. It estimates body fat based on height and weight. Your health  care provider can help determine your BMI and help you achieve or maintain a healthy weight. Get regular exercise Get regular exercise. This is one of the most important things you can do for your health. Most adults should: Exercise for at least 150 minutes each week. The exercise should increase your heart rate and make you sweat (moderate-intensity  exercise). Do strengthening exercises at least twice a week. This is in addition to the moderate-intensity exercise. Spend less time sitting. Even light physical activity can be beneficial. Watch cholesterol and blood lipids Have your blood tested for lipids and cholesterol at 74 years of age, then have this test every 5 years. Have your cholesterol levels checked more often if: Your lipid or cholesterol levels are high. You are older than 74 years of age. You are at high risk for heart disease. What should I know about cancer screening? Depending on your health history and family history, you may need to have cancer screening at various ages. This may include screening for: Breast cancer. Cervical cancer. Colorectal cancer. Skin cancer. Lung cancer. What should I know about heart disease, diabetes, and high blood pressure? Blood pressure and heart disease High blood pressure causes heart disease and increases the risk of stroke. This is more likely to develop in people who have high blood pressure readings or are overweight. Have your blood pressure checked: Every 3-5 years if you are 75-25 years of age. Every year if you are 38 years old or older. Diabetes Have regular diabetes screenings. This checks your fasting blood sugar level. Have the screening done: Once every three years after age 67 if you are at a normal weight and have a low risk for diabetes. More often and at a younger age if you are overweight or have a high risk for diabetes. What should I know about preventing infection? Hepatitis B If you have a higher risk for hepatitis B, you should be screened for this virus. Talk with your health care provider to find out if you are at risk for hepatitis B infection. Hepatitis C Testing is recommended for: Everyone born from [birth year] through 1967. Anyone with known risk factors for hepatitis C. Sexually transmitted infections (STIs) Get screened for STIs, including gonorrhea and  chlamydia, if: You are sexually active and are younger than 74 years of age. You are older than 74 years of age and your health care provider tells you that you are at risk for this type of infection. Your sexual activity has changed since you were last screened, and you are at increased risk for chlamydia or gonorrhea. Ask your health care provider if you are at risk. Ask your health care provider about whether you are at high risk for HIV. Your health care provider may recommend a prescription medicine to help prevent HIV infection. If you choose to take medicine to prevent HIV, you should first get tested for HIV. You should then be tested every 3 months for as long as you are taking the medicine. Pregnancy If you are about to stop having your period (premenopausal) and you may become pregnant, seek counseling before you get pregnant. Take 400 to 800 micrograms (mcg) of folic acid every day if you become pregnant. Ask for birth control (contraception) if you want to prevent pregnancy. Osteoporosis and menopause Osteoporosis is a disease in which the bones lose minerals and strength with aging. This can result in bone fractures. If you are 3 years old or older, or if you are at risk  for osteoporosis and fractures, ask your health care provider if you should: Be screened for bone loss. Take a calcium or vitamin D supplement to lower your risk of fractures. Be given hormone replacement therapy (HRT) to treat symptoms of menopause. Follow these instructions at home: Alcohol use Do not drink alcohol if: Your health care provider tells you not to drink. You are pregnant, may be pregnant, or are planning to become pregnant. If you drink alcohol: Limit how much you have to: 0-1 drink a day. Know how much alcohol is in your drink. In the [place] one drink equals one 12 oz bottle of beer (355 mL), one 5 oz glass of wine (148 mL), or one 1 oz glass of hard liquor (44 mL). Lifestyle Do not use any  products that contain nicotine or tobacco. These products include cigarettes, chewing tobacco, and vaping devices, such as e-cigarettes. If you need help quitting, ask your health care provider. Do not use street drugs. Do not share needles. Ask your health care provider for help if you need support or information about quitting drugs. General instructions Schedule regular health, dental, and eye exams. Stay current with your vaccines. Tell your health care provider if: You often feel depressed. You have ever been abused or do not feel safe at home. Summary Adopting a healthy lifestyle and getting preventive care are important in promoting health and wellness. Follow your health care provider's instructions about healthy diet, exercising, and getting tested or screened for diseases. Follow your health care provider's instructions on monitoring your cholesterol and blood pressure. This information is not intended to replace advice given to you by your health care provider. Make sure you discuss any questions you have with your health care provider. Document Revised: 03/30/2021 Document Reviewed: 03/30/2021 Elsevier Patient Education  2024 [employer].

## 2023-10-18 ENCOUNTER — Encounter: Payer: Self-pay | Admitting: Family Medicine

## 2023-10-18 ENCOUNTER — Ambulatory Visit (INDEPENDENT_AMBULATORY_CARE_PROVIDER_SITE_OTHER): Payer: Medicare Other | Admitting: Family Medicine

## 2023-10-18 VITALS — BP 128/84 | HR 70 | Resp 12 | Ht 64.0 in | Wt 271.0 lb

## 2023-10-18 DIAGNOSIS — E119 Type 2 diabetes mellitus without complications: Secondary | ICD-10-CM

## 2023-10-18 DIAGNOSIS — I10 Essential (primary) hypertension: Secondary | ICD-10-CM | POA: Diagnosis not present

## 2023-10-18 DIAGNOSIS — E039 Hypothyroidism, unspecified: Secondary | ICD-10-CM | POA: Diagnosis not present

## 2023-10-18 DIAGNOSIS — M170 Bilateral primary osteoarthritis of knee: Secondary | ICD-10-CM | POA: Diagnosis not present

## 2023-10-18 DIAGNOSIS — N1831 Chronic kidney disease, stage 3a: Secondary | ICD-10-CM

## 2023-10-18 MED ORDER — TIRZEPATIDE 10 MG/0.5ML ~~LOC~~ SOAJ: 10.0000 mg | SUBCUTANEOUS | 2 refills | Status: DC

## 2023-10-18 MED ORDER — LISINOPRIL 10 MG PO TABS
10.0000 mg | ORAL_TABLET | Freq: Every day | ORAL | 3 refills | Status: DC
Start: 2023-10-18 — End: 2024-10-10

## 2023-10-18 NOTE — Assessment & Plan Note (Signed)
To recheck TSH we can make adjustments if needed.

## 2023-10-18 NOTE — Assessment & Plan Note (Addendum)
Well controlled. Continue current regimen. Follow up in  3-4 mo has been splitting her lisinopril so we will rewrite medication for the 10 mg dose.

## 2023-10-18 NOTE — Assessment & Plan Note (Signed)
Currently on pregabalin

## 2023-10-18 NOTE — Assessment & Plan Note (Addendum)
She is doing well overall.  Due for A1c.  Will get this done on the blood work.  Will go ahead and bump up Mounjaro to 10 mg to take advantage of lowering her BMI.  Continue current regimen otherwise follow-up in 3 to 4 months.  Exam performed today.

## 2023-10-18 NOTE — Assessment & Plan Note (Signed)
Due to recheck renal function.

## 2023-10-18 NOTE — Progress Notes (Addendum)
Established Patient Office Visit  Subjective   Patient ID: Regina Houston, female    DOB: 1951-09-29  Age: 72 y.o. MRN: [id number]  Chief Complaint  Patient presents with   Diabetes   Hypertension    HPI  Diabetes - no hypoglycemic events. No wounds or sores that are not healing well. No increased thirst or urination. Checking glucose at home. Taking medications as prescribed without any side effects.  She has had her Medicare to With her Regina Houston so it has been quite expensive.  Hypertension- Pt denies chest pain, SOB, dizziness, or heart palpitations.  Taking meds as directed w/o problems.  Denies medication side effects.    She did have her hearing exam done for the year and it was stable compared to prior year they are not quite at the point of pulling the trigger on hearing aids but she could if she wants to but she is gena hold off and just plan to recheck again next year.  Did have the thyroid biopsy done on both lesions.  Everything came back benign which is great the had a little bit of complication with the one the left because it was calcified but she is recovering from that.  Continues to struggle with her back pain and is taking pregabalin.   ROS    Objective:     BP 128/84   Pulse 70   Resp 12   Ht 5\' 4"  (1.626 m)   Wt 271 lb 0.6 oz (122.9 kg)   SpO2 99%   BMI 46.52 kg/m    Physical Exam Vitals and nursing note reviewed.  Constitutional:      Appearance: Normal appearance.  HENT:     Head: Normocephalic and atraumatic.  Eyes:     Conjunctiva/sclera: Conjunctivae normal.  Cardiovascular:     Rate and Rhythm: Normal rate and regular rhythm.  Pulmonary:     Effort: Pulmonary effort is normal.     Breath sounds: Normal breath sounds.  Skin:    General: Skin is warm and dry.  Neurological:     Mental Status: She is alert.  Psychiatric:        Mood and Affect: Mood normal.      No results found for any visits on 10/18/23.    The 10-year ASCVD  risk score (Arnett DK, et al., 2019) is: 33%    Assessment & Plan:   Problem List Items Addressed This Visit       Cardiovascular and Mediastinum   Essential hypertension - Primary    Well controlled. Continue current regimen. Follow up in  3-4 mo has been splitting her lisinopril so we will rewrite medication for the 10 mg dose.      Relevant Medications   lisinopril (ZESTRIL) 10 MG tablet   Other Relevant Orders   TSH   CMP14+EGFR     Endocrine   Hypothyroidism    To recheck TSH we can make adjustments if needed.      Relevant Orders   TSH   Diabetes mellitus (HCC)    She is doing well overall.  Due for A1c.  Will get this done on the blood work.  Will go ahead and bump up Mounjaro to 10 mg to take advantage of lowering her BMI.  Continue current regimen otherwise follow-up in 3 to 4 months.  Exam performed today.      Relevant Medications   lisinopril (ZESTRIL) 10 MG tablet   tirzepatide (MOUNJARO) 10 MG/0.5ML Pen  Other Relevant Orders   HgB A1c   TSH   CMP14+EGFR     Musculoskeletal and Integument   Primary osteoarthritis of both knees    Currently on pregabalin.        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      Relevant Orders   TSH   CMP14+EGFR   Urged her to schedule her mammogram when she gets a chance.  Return in about 4 months (around 02/15/2024) for Diabetes follow-up.    Regina Gasser, MD

## 2023-10-19 ENCOUNTER — Other Ambulatory Visit: Payer: Self-pay | Admitting: Family Medicine

## 2023-10-19 DIAGNOSIS — Z1231 Encounter for screening mammogram for malignant neoplasm of breast: Secondary | ICD-10-CM

## 2023-10-19 LAB — CMP14+EGFR
ALT: 17 [IU]/L (ref 0–32)
AST: 25 [IU]/L (ref 0–40)
Albumin: 4.6 g/dL (ref 3.8–4.8)
Alkaline Phosphatase: 108 [IU]/L (ref 44–121)
BUN/Creatinine Ratio: 22 (ref 12–28)
BUN: 28 mg/dL — ABNORMAL HIGH (ref 8–27)
Bilirubin Total: 0.3 mg/dL (ref 0.0–1.2)
CO2: 23 mmol/L (ref 20–29)
Calcium: 9.7 mg/dL (ref 8.7–10.3)
Chloride: 106 mmol/L (ref 96–106)
Creatinine, Ser: 1.26 mg/dL — ABNORMAL HIGH (ref 0.57–1.00)
Globulin, Total: 1.9 g/dL (ref 1.5–4.5)
Glucose: 87 mg/dL (ref 70–99)
Potassium: 5 mmol/L (ref 3.5–5.2)
Sodium: 143 mmol/L (ref 134–144)
Total Protein: 6.5 g/dL (ref 6.0–8.5)
eGFR: 45 mL/min/{1.73_m2} — ABNORMAL LOW (ref >59)

## 2023-10-19 LAB — HEMOGLOBIN A1C
Est. average glucose Bld gHb Est-mCnc: 114 mg/dL
Hgb A1c MFr Bld: 5.6 % (ref 4.8–5.6)

## 2023-10-19 LAB — TSH: TSH: 1.06 u[IU]/mL (ref 0.450–4.500)

## 2023-10-19 NOTE — Progress Notes (Signed)
Hi Ambra, kidney function is still right around 1.2 which is similar to what it was about 5 months ago but she still look a little dry on the blood work.  Keep trying to stay hydrated.  You can also try decreasing the Lasix just a little bit as well maybe 1 every other day that sometimes can help as long as you do not feel like you are starting to swell too much.  Liver function looks great A1c looks great.  Thyroid looks perfect.

## 2023-10-21 ENCOUNTER — Other Ambulatory Visit: Payer: Self-pay | Admitting: Family Medicine

## 2023-10-21 DIAGNOSIS — E039 Hypothyroidism, unspecified: Secondary | ICD-10-CM

## 2023-11-03 ENCOUNTER — Other Ambulatory Visit: Payer: Self-pay | Admitting: Family Medicine

## 2023-11-03 DIAGNOSIS — M47816 Spondylosis without myelopathy or radiculopathy, lumbar region: Secondary | ICD-10-CM

## 2023-11-14 ENCOUNTER — Other Ambulatory Visit: Payer: Self-pay | Admitting: Family Medicine

## 2023-12-14 ENCOUNTER — Ambulatory Visit: Payer: Medicare Other

## 2023-12-14 DIAGNOSIS — Z1231 Encounter for screening mammogram for malignant neoplasm of breast: Secondary | ICD-10-CM

## 2023-12-16 ENCOUNTER — Encounter: Payer: Self-pay | Admitting: Family Medicine

## 2023-12-16 NOTE — Progress Notes (Signed)
Please call patient. Normal mammogram.  Repeat in 1 year.

## 2024-01-01 ENCOUNTER — Other Ambulatory Visit: Payer: Self-pay | Admitting: Sports Medicine

## 2024-01-06 ENCOUNTER — Other Ambulatory Visit: Payer: Self-pay | Admitting: Family Medicine

## 2024-01-06 DIAGNOSIS — E119 Type 2 diabetes mellitus without complications: Secondary | ICD-10-CM

## 2024-01-16 ENCOUNTER — Other Ambulatory Visit: Payer: Self-pay | Admitting: Family Medicine

## 2024-01-16 DIAGNOSIS — E039 Hypothyroidism, unspecified: Secondary | ICD-10-CM

## 2024-01-20 ENCOUNTER — Telehealth: Payer: Self-pay

## 2024-01-20 NOTE — Telephone Encounter (Signed)
 Copied from CRM [id number]. Topic: Clinical - Prescription Issue >> Jan 20, 2024  4:19 PM Ivette P wrote: Reason for CRM: Regina Houston called in to get a manufacture change for the pt prescription levothyroxine (SYNTHROID) 100 MCG tablet .  Callback Number [id number]

## 2024-01-20 NOTE — Telephone Encounter (Signed)
 Recheck labs in 6 -8weeks after start new pill

## 2024-01-23 NOTE — Telephone Encounter (Signed)
 Patient informed.

## 2024-02-09 ENCOUNTER — Other Ambulatory Visit: Payer: Self-pay | Admitting: Family Medicine

## 2024-02-09 DIAGNOSIS — M47816 Spondylosis without myelopathy or radiculopathy, lumbar region: Secondary | ICD-10-CM

## 2024-02-10 ENCOUNTER — Other Ambulatory Visit: Payer: Self-pay | Admitting: Family Medicine

## 2024-02-15 ENCOUNTER — Encounter: Payer: Self-pay | Admitting: Family Medicine

## 2024-02-15 ENCOUNTER — Ambulatory Visit: Payer: Medicare Other | Admitting: Family Medicine

## 2024-02-15 ENCOUNTER — Ambulatory Visit (INDEPENDENT_AMBULATORY_CARE_PROVIDER_SITE_OTHER): Payer: Medicare Other | Admitting: Family Medicine

## 2024-02-15 VITALS — BP 128/55 | HR 62 | Ht 64.0 in | Wt 267.0 lb

## 2024-02-15 DIAGNOSIS — L814 Other melanin hyperpigmentation: Secondary | ICD-10-CM | POA: Diagnosis not present

## 2024-02-15 DIAGNOSIS — E1122 Type 2 diabetes mellitus with diabetic chronic kidney disease: Secondary | ICD-10-CM

## 2024-02-15 DIAGNOSIS — D485 Neoplasm of uncertain behavior of skin: Secondary | ICD-10-CM | POA: Diagnosis not present

## 2024-02-15 DIAGNOSIS — I10 Essential (primary) hypertension: Secondary | ICD-10-CM

## 2024-02-15 DIAGNOSIS — N1831 Chronic kidney disease, stage 3a: Secondary | ICD-10-CM | POA: Diagnosis not present

## 2024-02-15 DIAGNOSIS — E119 Type 2 diabetes mellitus without complications: Secondary | ICD-10-CM | POA: Diagnosis not present

## 2024-02-15 DIAGNOSIS — L821 Other seborrheic keratosis: Secondary | ICD-10-CM | POA: Diagnosis not present

## 2024-02-15 DIAGNOSIS — E039 Hypothyroidism, unspecified: Secondary | ICD-10-CM

## 2024-02-15 DIAGNOSIS — D1801 Hemangioma of skin and subcutaneous tissue: Secondary | ICD-10-CM | POA: Diagnosis not present

## 2024-02-15 DIAGNOSIS — L578 Other skin changes due to chronic exposure to nonionizing radiation: Secondary | ICD-10-CM | POA: Diagnosis not present

## 2024-02-15 DIAGNOSIS — L820 Inflamed seborrheic keratosis: Secondary | ICD-10-CM | POA: Diagnosis not present

## 2024-02-15 DIAGNOSIS — L570 Actinic keratosis: Secondary | ICD-10-CM | POA: Diagnosis not present

## 2024-02-15 LAB — POCT GLYCOSYLATED HEMOGLOBIN (HGB A1C): Hemoglobin A1C: 5.5 % (ref 4.0–5.6)

## 2024-02-15 MED ORDER — ONETOUCH ULTRA VI STRP
ORAL_STRIP | 12 refills | Status: AC
Start: 2024-02-15 — End: ?

## 2024-02-15 NOTE — Assessment & Plan Note (Addendum)
 Well controlled. Continue current regimen. Follow up in  6 months.  Also mentions 1 episode where she was out in the yard mowing the lawn and started to feel like things were going to blackout she went in the house and checked her blood pressure and it was low.  She said eventually it came up.  We did discuss that she could certainly skip her diuretic on those days.  I really wanted her taking it a little less frequently anyway because her BUN and creatinine were elevated last visit.  Okay to cut lisinopril in half if blood pressures are running lower.

## 2024-02-15 NOTE — Assessment & Plan Note (Signed)
  Lab Results  Component Value Date   HGBA1C 5.5 02/15/2024   A1c looks phenomenal today.  Continue current regimen with Mounjaro.  We did discuss the possibility of going up on her dose if she would like.  She is down another 4 pounds since the last time I saw her.

## 2024-02-15 NOTE — Assessment & Plan Note (Signed)
Due to recheck thyroid level.  

## 2024-02-15 NOTE — Progress Notes (Signed)
 Established Patient Office Visit  Subjective  Patient ID: Regina Houston, female    DOB: 21-Aug-1951  Age: 73 y.o. MRN: [id number]  Chief Complaint  Patient presents with   Diabetes   Hypothyroidism   Hypertension    HPI Diabetes - no hypoglycemic events. No wounds or sores that are not healing well. No increased thirst or urination. Checking glucose at home. Taking medications as prescribed without any side effects.  Hypertension- Pt denies chest pain, SOB, dizziness, or heart palpitations.  Taking meds as directed w/o problems.  Denies medication side effects.  Try to be a little bit more active getting out walking her dog.  Hypothyroidism - Taking medication regularly in the AM away from food and vitamins, etc. No recent change to skin, hair, or energy levels.  BUN/Cr slightly elevated on labs in [place].   Saw derm today had several lesions that were frozen and a biopsy done on her left ear.    ROS    Objective:     BP (!) 128/55   Pulse 62   Ht 5\' 4"  (1.626 m)   Wt 267 lb (121.1 kg)   SpO2 98%   BMI 45.83 kg/m    Physical Exam Vitals and nursing note reviewed.  Constitutional:      Appearance: Normal appearance.  HENT:     Head: Normocephalic and atraumatic.  Eyes:     Conjunctiva/sclera: Conjunctivae normal.  Cardiovascular:     Rate and Rhythm: Normal rate and regular rhythm.  Pulmonary:     Effort: Pulmonary effort is normal.     Breath sounds: Normal breath sounds.  Skin:    General: Skin is warm and dry.  Neurological:     Mental Status: She is alert.  Psychiatric:        Mood and Affect: Mood normal.      Results for orders placed or performed in visit on 02/15/24  POCT HgB A1C  Result Value Ref Range   Hemoglobin A1C 5.5 4.0 - 5.6 %   HbA1c POC (<> result, manual entry)     HbA1c, POC (prediabetic range)     HbA1c, POC (controlled diabetic range)        The 10-year ASCVD risk score (Arnett DK, et al., 2019) is: 36.7%    Assessment &  Plan:   Problem List Items Addressed This Visit       Cardiovascular and Mediastinum   Essential hypertension - Primary   Well controlled. Continue current regimen. Follow up in  6 months.  Also mentions 1 episode where she was out in the yard mowing the lawn and started to feel like things were going to blackout she went in the house and checked her blood pressure and it was low.  She said eventually it came up.  We did discuss that she could certainly skip her diuretic on those days.  I really wanted her taking it a little less frequently anyway because her BUN and creatinine were elevated last visit.  Okay to cut lisinopril in half if blood pressures are running lower.        Endocrine   Hypothyroidism   Due to recheck thyroid level.      Diabetes mellitus (HCC)    Lab Results  Component Value Date   HGBA1C 5.5 02/15/2024   A1c looks phenomenal today.  Continue current regimen with Mounjaro.  We did discuss the possibility of going up on her dose if she would like.  She is down  another 4 pounds since the last time I saw her.      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  Continue lisinopril 10 mg daily.  We did discuss though that if blood pressures are low she could also decrease her dose to half a tab if needed.       Return in about 4 months (around 06/16/2024) for Diabetes follow-up.    Nani Gasser, MD

## 2024-02-15 NOTE — Assessment & Plan Note (Signed)
 Need to monitor renal function every 6 months.  Continue lisinopril 10 mg daily.  We did discuss though that if blood pressures are low she could also decrease her dose to half a tab if needed.

## 2024-03-06 ENCOUNTER — Other Ambulatory Visit: Payer: Self-pay | Admitting: Family Medicine

## 2024-03-06 DIAGNOSIS — E119 Type 2 diabetes mellitus without complications: Secondary | ICD-10-CM

## 2024-03-08 ENCOUNTER — Other Ambulatory Visit: Payer: Self-pay | Admitting: Family Medicine

## 2024-03-08 DIAGNOSIS — E119 Type 2 diabetes mellitus without complications: Secondary | ICD-10-CM | POA: Diagnosis not present

## 2024-03-08 DIAGNOSIS — E785 Hyperlipidemia, unspecified: Secondary | ICD-10-CM

## 2024-03-08 DIAGNOSIS — H35371 Puckering of macula, right eye: Secondary | ICD-10-CM | POA: Diagnosis not present

## 2024-03-08 DIAGNOSIS — H524 Presbyopia: Secondary | ICD-10-CM | POA: Diagnosis not present

## 2024-03-08 DIAGNOSIS — B0052 Herpesviral keratitis: Secondary | ICD-10-CM | POA: Diagnosis not present

## 2024-03-08 DIAGNOSIS — H2513 Age-related nuclear cataract, bilateral: Secondary | ICD-10-CM | POA: Diagnosis not present

## 2024-03-08 DIAGNOSIS — H04123 Dry eye syndrome of bilateral lacrimal glands: Secondary | ICD-10-CM | POA: Diagnosis not present

## 2024-03-08 DIAGNOSIS — H05242 Constant exophthalmos, left eye: Secondary | ICD-10-CM | POA: Diagnosis not present

## 2024-03-29 ENCOUNTER — Telehealth: Payer: Self-pay | Admitting: Family Medicine

## 2024-03-29 MED ORDER — TIRZEPATIDE 12.5 MG/0.5ML ~~LOC~~ SOAJ: 12.5000 mg | SUBCUTANEOUS | 0 refills | Status: DC

## 2024-03-29 NOTE — Telephone Encounter (Signed)
Meds ordered this encounter  Medications   tirzepatide (MOUNJARO) 12.5 MG/0.5ML Pen    Sig: Inject 12.5 mg into the skin once a week.    Dispense:  6 mL    Refill:  0

## 2024-03-29 NOTE — Addendum Note (Signed)
 Addended by: Azyriah Nevins D on: 03/29/2024 05:16 PM   Modules accepted: Orders

## 2024-03-29 NOTE — Telephone Encounter (Signed)
 Copied from CRM [id number]. Topic: Clinical - Medical Advice >> Mar 29, 2024 10:40 AM Regina Houston wrote: Reason for CRM: Patient is requesting a higher dosage in the MOUNJARO  10 MG/0.5ML Pen. Dr. Greer Leak and the patient discussed taking the 12 mg dosage and she's read to try this now.

## 2024-03-30 NOTE — Telephone Encounter (Signed)
 This request has been handled. No further action is required. Patient has been updated of dose change for Mounjaro .

## 2024-04-07 ENCOUNTER — Other Ambulatory Visit: Payer: Self-pay | Admitting: Sports Medicine

## 2024-04-17 ENCOUNTER — Other Ambulatory Visit: Payer: Self-pay | Admitting: Family Medicine

## 2024-04-17 DIAGNOSIS — E039 Hypothyroidism, unspecified: Secondary | ICD-10-CM

## 2024-04-17 MED ORDER — LEVOTHYROXINE SODIUM 100 MCG PO TABS: 100.0000 ug | ORAL_TABLET | Freq: Every day | ORAL | 0 refills | Status: DC

## 2024-05-18 ENCOUNTER — Other Ambulatory Visit: Payer: Self-pay | Admitting: Family Medicine

## 2024-06-19 ENCOUNTER — Ambulatory Visit: Admitting: Family Medicine

## 2024-06-21 ENCOUNTER — Ambulatory Visit: Admitting: Family Medicine

## 2024-06-21 ENCOUNTER — Encounter: Payer: Self-pay | Admitting: Family Medicine

## 2024-06-21 VITALS — BP 105/50 | HR 70 | Ht 64.0 in | Wt 257.1 lb

## 2024-06-21 DIAGNOSIS — N1831 Chronic kidney disease, stage 3a: Secondary | ICD-10-CM | POA: Diagnosis not present

## 2024-06-21 DIAGNOSIS — I890 Lymphedema, not elsewhere classified: Secondary | ICD-10-CM

## 2024-06-21 DIAGNOSIS — E1122 Type 2 diabetes mellitus with diabetic chronic kidney disease: Secondary | ICD-10-CM

## 2024-06-21 DIAGNOSIS — E039 Hypothyroidism, unspecified: Secondary | ICD-10-CM | POA: Diagnosis not present

## 2024-06-21 DIAGNOSIS — I10 Essential (primary) hypertension: Secondary | ICD-10-CM

## 2024-06-21 LAB — POCT GLYCOSYLATED HEMOGLOBIN (HGB A1C): Hemoglobin A1C: 5.4 % (ref 4.0–5.6)

## 2024-06-21 MED ORDER — TIRZEPATIDE 12.5 MG/0.5ML ~~LOC~~ SOAJ: 12.5000 mg | SUBCUTANEOUS | 1 refills | Status: DC

## 2024-06-21 NOTE — Progress Notes (Signed)
 Established Patient Office Visit  Subjective  Patient ID: Regina Houston, female    DOB: 1951/02/12  Age: 73 y.o. MRN: [id number]  Chief Complaint  Patient presents with   Diabetes   Hypertension    HPI Diabetes - no hypoglycemic events. No wounds or sores that are not healing well. No increased thirst or urination. Checking glucose at home. Taking medications as prescribed without any side effects.  She also is concerned because she is having some skin sensitivity particularly on her forearms that does not last for long period of time occasionally she will even notice it on her torso.  No recent changes.  Hypothyroidism-doing well and taking her medications regularly.  No recent changes.    ROS    Objective:     BP (!) 105/50   Pulse 70   Ht 5' 4 (1.626 m)   Wt 257 lb 1.9 oz (116.6 kg)   SpO2 100%   BMI 44.13 kg/m    Physical Exam Vitals and nursing note reviewed.  Constitutional:      Appearance: Normal appearance.  HENT:     Head: Normocephalic and atraumatic.  Eyes:     Conjunctiva/sclera: Conjunctivae normal.  Cardiovascular:     Rate and Rhythm: Normal rate and regular rhythm.  Pulmonary:     Effort: Pulmonary effort is normal.     Breath sounds: Normal breath sounds.  Musculoskeletal:     Comments: Lower extremities with lymphedema but she also has some pitting edema on the tops of her feet.  Skin:    General: Skin is warm and dry.  Neurological:     Mental Status: She is alert.  Psychiatric:        Mood and Affect: Mood normal.      Results for orders placed or performed in visit on 06/21/24  POCT HgB A1C  Result Value Ref Range   Hemoglobin A1C 5.4 4.0 - 5.6 %   HbA1c POC (<> result, manual entry)     HbA1c, POC (prediabetic range)     HbA1c, POC (controlled diabetic range)        The 10-year ASCVD risk score (Arnett DK, et al., 2019) is: 26.4%    Assessment & Plan:   Problem List Items Addressed This Visit       Cardiovascular  and Mediastinum   Essential hypertension - Primary   BP is low today she says at home she is getting a little bit more normal blood pressures when she checks it.  Monitor blood sugar at home.  If you are getting blood pressures pretty consistently under 112 please let me know so that we can cut your lisinopril  in half and decrease down to 5 mg.      Relevant Orders   CMP14+EGFR   Lipid panel   CBC   Urine Microalbumin w/creat. ratio   VITAMIN D 25 Hydroxy (Vit-D Deficiency, Fractures)   TSH     Endocrine   Hypothyroidism   Due to recheck thyroid  level today she is also having some skin sensitivity and she has lost a fair amount of weight since starting on the GLP-1.  So her dose may need to be adjusted.      Relevant Orders   CMP14+EGFR   Lipid panel   CBC   Urine Microalbumin w/creat. ratio   VITAMIN D 25 Hydroxy (Vit-D Deficiency, Fractures)   TSH   Diabetes mellitus (HCC)   A1C looks absolutely phenomenal at 5.4.  Continued current regimen she  is really doing fantastic continue to work on healthy diet regular exercise and weight loss.  She is actually down 10 pounds since I last saw her she is doing absolutely fantastic.  Continue low-dose ACE inhibitor that we could always decrease the dose further if her blood pressures continue to stay low.  Since she is doing well on the 12.5 mg Mounjaro  we will get that refilled today.      Relevant Medications   tirzepatide  (MOUNJARO ) 12.5 MG/0.5ML Pen   Other Relevant Orders   CMP14+EGFR   Lipid panel   CBC   Urine Microalbumin w/creat. 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      Relevant Orders   CMP14+EGFR   Lipid panel   CBC   Urine Microalbumin w/creat. ratio   VITAMIN D 25 Hydroxy (Vit-D Deficiency, Fractures)   TSH      Other   Lymph edema   Relevant Orders   CMP14+EGFR   Lipid panel   CBC   Urine Microalbumin w/creat. ratio   VITAMIN D 25 Hydroxy (Vit-D Deficiency, Fractures)   TSH   Lymphedema-we did discuss that if at some point she wants a referral to lymphedema clinic we can do that.  Compression stockings would help as well right now it is hot so she has not been wearing them consistently.  Return in about 4 months (around 10/21/2024) for Diabetes follow-up.    Dorothyann Byars, MD

## 2024-06-21 NOTE — Assessment & Plan Note (Addendum)
 A1C looks absolutely phenomenal at 5.4.  Continued current regimen she is really doing fantastic continue to work on healthy diet regular exercise and weight loss.  She is actually down 10 pounds since I last saw her she is doing absolutely fantastic.  Continue low-dose ACE inhibitor that we could always decrease the dose further if her blood pressures continue to stay low.  Since she is doing well on the 12.5 mg Mounjaro  we will get that refilled today.

## 2024-06-21 NOTE — Assessment & Plan Note (Signed)
 Get updated renal function and urine microalbumin.

## 2024-06-21 NOTE — Addendum Note (Signed)
 Addended by: Keyona Emrich D on: 06/21/2024 01:05 PM   Modules accepted: Level of Service

## 2024-06-21 NOTE — Assessment & Plan Note (Signed)
 BP is low today she says at home she is getting a little bit more normal blood pressures when she checks it.  Monitor blood sugar at home.  If you are getting blood pressures pretty consistently under 112 please let me know so that we can cut your lisinopril  in half and decrease down to 5 mg.

## 2024-06-21 NOTE — Assessment & Plan Note (Signed)
 Due to recheck thyroid  level today she is also having some skin sensitivity and she has lost a fair amount of weight since starting on the GLP-1.  So her dose may need to be adjusted.

## 2024-06-21 NOTE — Patient Instructions (Signed)
 Monitor blood sugar at home.  If you are getting blood pressures pretty consistently under 112 please let me know so that we can cut your lisinopril  in half and decrease down to 5 mg.

## 2024-06-22 LAB — LIPID PANEL
Chol/HDL Ratio: 2.6 ratio (ref 0.0–4.4)
Cholesterol, Total: 190 mg/dL (ref 100–199)
HDL: 72 mg/dL (ref >39)
LDL Chol Calc (NIH): 107 mg/dL — ABNORMAL HIGH (ref 0–99)
Triglycerides: 56 mg/dL (ref 0–149)
VLDL Cholesterol Cal: 11 mg/dL (ref 5–40)

## 2024-06-22 LAB — CBC
Hematocrit: 39.7 % (ref 34.0–46.6)
Hemoglobin: 13.3 g/dL (ref 11.1–15.9)
MCH: 30.8 pg (ref 26.6–33.0)
MCHC: 33.5 g/dL (ref 31.5–35.7)
MCV: 92 fL (ref 79–97)
Platelets: 244 x10E3/uL (ref 150–450)
RBC: 4.32 x10E6/uL (ref 3.77–5.28)
RDW: 13.1 % (ref 11.7–15.4)
WBC: 6.1 x10E3/uL (ref 3.4–10.8)

## 2024-06-22 LAB — CMP14+EGFR
ALT: 17 IU/L (ref 0–32)
AST: 21 IU/L (ref 0–40)
Albumin: 4.5 g/dL (ref 3.8–4.8)
Alkaline Phosphatase: 110 IU/L (ref 44–121)
BUN/Creatinine Ratio: 30 — ABNORMAL HIGH (ref 12–28)
BUN: 42 mg/dL — ABNORMAL HIGH (ref 8–27)
Bilirubin Total: 0.3 mg/dL (ref 0.0–1.2)
CO2: 20 mmol/L (ref 20–29)
Calcium: 9.5 mg/dL (ref 8.7–10.3)
Chloride: 104 mmol/L (ref 96–106)
Creatinine, Ser: 1.42 mg/dL — ABNORMAL HIGH (ref 0.57–1.00)
Globulin, Total: 2 g/dL (ref 1.5–4.5)
Glucose: 94 mg/dL (ref 70–99)
Potassium: 4.9 mmol/L (ref 3.5–5.2)
Sodium: 142 mmol/L (ref 134–144)
Total Protein: 6.5 g/dL (ref 6.0–8.5)
eGFR: 39 mL/min/1.73 — ABNORMAL LOW (ref >59)

## 2024-06-22 LAB — MICROALBUMIN / CREATININE URINE RATIO
Creatinine, Urine: 50.7 mg/dL
Microalb/Creat Ratio: <6 mg/g{creat} (ref 0–29)
Microalbumin, Urine: <3 ug/mL

## 2024-06-22 LAB — TSH: TSH: 1.56 u[IU]/mL (ref 0.450–4.500)

## 2024-06-22 LAB — VITAMIN D 25 HYDROXY (VIT D DEFICIENCY, FRACTURES): Vit D, 25-Hydroxy: 46.6 ng/mL (ref 30.0–100.0)

## 2024-06-23 ENCOUNTER — Ambulatory Visit: Payer: Self-pay | Admitting: Family Medicine

## 2024-06-23 DIAGNOSIS — E785 Hyperlipidemia, unspecified: Secondary | ICD-10-CM

## 2024-06-23 NOTE — Progress Notes (Signed)
 HI Regina Houston, your LDL is slightly elevated at 107. We need to look at changing your simvastatin  to one a little more powerful to get you numbers down. Would you be ok with that? Your kidney function is up a little but you look dry on your labwork.  Increase your water intake and then I would like to recheck you labs in 2 weeks.  Your blood count is OK and no excess protein in the urine so that is good. Vit D and thyroid  are good.   The 10-year ASCVD risk score (Arnett DK, et al., 2019) is: 26.4%   Values used to calculate the score:     Age: 73 years     Clincally relevant sex: Female     Is Non-Hispanic African American: No     Diabetic: Yes     Tobacco smoker: No     Systolic Blood Pressure: 105 mmHg     Is BP treated: Yes     HDL Cholesterol: 72 mg/dL     Total Cholesterol: 190 mg/dL

## 2024-06-25 MED ORDER — ROSUVASTATIN CALCIUM 20 MG PO TABS: 20.0000 mg | ORAL_TABLET | Freq: Every day | ORAL | 3 refills | Status: DC

## 2024-06-25 NOTE — Telephone Encounter (Signed)
 Meds ordered this encounter  Medications   rosuvastatin  (CRESTOR ) 20 MG tablet    Sig: Take 1 tablet (20 mg total) by mouth at bedtime.    Dispense:  90 tablet    Refill:  3    Orders Placed This Encounter  Procedures   CMP14+EGFR   Lipid panel

## 2024-06-27 DIAGNOSIS — H8110 Benign paroxysmal vertigo, unspecified ear: Secondary | ICD-10-CM | POA: Diagnosis not present

## 2024-06-27 DIAGNOSIS — E049 Nontoxic goiter, unspecified: Secondary | ICD-10-CM | POA: Diagnosis not present

## 2024-06-27 DIAGNOSIS — H903 Sensorineural hearing loss, bilateral: Secondary | ICD-10-CM | POA: Diagnosis not present

## 2024-06-27 DIAGNOSIS — H838X3 Other specified diseases of inner ear, bilateral: Secondary | ICD-10-CM | POA: Diagnosis not present

## 2024-07-03 ENCOUNTER — Telehealth: Payer: Self-pay

## 2024-07-03 MED ORDER — ROSUVASTATIN CALCIUM 20 MG PO TABS: 20.0000 mg | ORAL_TABLET | Freq: Every day | ORAL | 3 refills | Status: AC

## 2024-07-03 NOTE — Telephone Encounter (Signed)
 Copied from CRM [id number]. Topic: Clinical - Prescription Issue >> Jul 03, 2024  9:22 AM Cherylann RAMAN wrote: Reason for CRM: Patient's medication for rosuvastatin  (CRESTOR ) 20 MG tablet is being sent to the incorrect pharmacy ([facility]). Patient's preferred pharmacy has been [facility] - [place], [place] - [address] [place] [address] Phone: [phone] Fax: [phone] Hours: Not open 24 hours  Patient has never used [facility] and does not want her medication to be sent there. Please contact patient at [phone] for additional information. May leave detailed message as VM is confidential.

## 2024-07-03 NOTE — Telephone Encounter (Signed)
 Prescription moved to local pharmacy.

## 2024-07-11 ENCOUNTER — Other Ambulatory Visit: Payer: Self-pay | Admitting: Sports Medicine

## 2024-07-11 DIAGNOSIS — E049 Nontoxic goiter, unspecified: Secondary | ICD-10-CM | POA: Diagnosis not present

## 2024-07-11 DIAGNOSIS — E042 Nontoxic multinodular goiter: Secondary | ICD-10-CM | POA: Diagnosis not present

## 2024-07-12 NOTE — Telephone Encounter (Signed)
 It has been well over a year since I have seen her however she is currently stable on current regimen so PCP may be willing to take over Lyrica  refills.

## 2024-07-17 ENCOUNTER — Ambulatory Visit (INDEPENDENT_AMBULATORY_CARE_PROVIDER_SITE_OTHER)

## 2024-07-17 ENCOUNTER — Ambulatory Visit: Admitting: Sports Medicine

## 2024-07-17 VITALS — BP 130/70 | Ht 64.0 in | Wt 257.0 lb

## 2024-07-17 DIAGNOSIS — M545 Low back pain, unspecified: Secondary | ICD-10-CM | POA: Diagnosis not present

## 2024-07-17 DIAGNOSIS — G8929 Other chronic pain: Secondary | ICD-10-CM | POA: Diagnosis not present

## 2024-07-17 DIAGNOSIS — M47816 Spondylosis without myelopathy or radiculopathy, lumbar region: Secondary | ICD-10-CM

## 2024-07-17 MED ORDER — CELECOXIB 200 MG PO CAPS: ORAL_CAPSULE | ORAL | 0 refills | Status: DC

## 2024-07-17 NOTE — Progress Notes (Signed)
   Subjective:    Patient ID: Regina Houston, female    DOB: 73 y.o., 01/29/1951   MRN: [id number]  HPI  Chief Complaint: Chronic back pain  Patient presenting today for follow-up on chronic back pain Currently on Lyrica  75 mg 3 times daily and currently taking Celebrex  200 mg once daily and feels that this regimen is stable for her. No numbness or tingling extending down her extremities. No fevers, chills, unexpected weight loss, bladder or bowel incontinence, saddle anesthesia.     Objective:   Physical Exam Vitals:   07/17/24 1016  BP: 130/70   Lumbar Spine -Inspection: no swelling or skin changes -Palpation: TTP - midline, mild TTP in paraspinals -AROM/PROM: FROM in all planes of the low back -Strength: full hip flexion (L1/L2), knee extension (L3/4), ankle dorsiflexion (L4/5), hip extension (L5/S1), knee flexion (L5/S1/S2) plantarflexion (S1/2). -Sensation: intact sensation over the medial femoral condyle (L3), patella (L4), lateral femoral condyle (L5), lateral malleolus (S1). -Reflexes: normal patellar (L3/4), hamstring (L5/S1), achilles (S1/2) reflexes, equal bilaterally -Special tests: - Straight Leg Raise, -Slump test   Lab Results  Component Value Date   CREATININE 1.42 (H) 06/21/2024      Assessment & Plan:   Regina Houston is a very pleasant 73 year old female presenting for follow-up on chronic back pain.  She has a very benign exam today and reports that her regimen is stable.  I have not a discussion with her regarding additional therapeutic options that she can explore particularly with an emphasis on the nonpharmacologic interventions including heat, ice, physical therapy, acupuncture, OMT, walking, but also Voltaren  gel and lidocaine patches.  Overall, we elected to continue her current regimen but I issued several precautions regarding long-term use of Celebrex  including cardiovascular risks.  Refill provided for Celebrex  today and referral sent to PT.  Follow-up as needed  for now.  If pain persist and at next follow-up visit, consider transition from Celebrex  to scheduled Voltaren  gel.

## 2024-07-19 ENCOUNTER — Other Ambulatory Visit: Payer: Self-pay

## 2024-07-19 ENCOUNTER — Ambulatory Visit: Admitting: Physical Therapy

## 2024-07-19 ENCOUNTER — Encounter: Payer: Self-pay | Admitting: Physical Therapy

## 2024-07-19 DIAGNOSIS — M545 Low back pain, unspecified: Secondary | ICD-10-CM | POA: Insufficient documentation

## 2024-07-19 DIAGNOSIS — M6281 Muscle weakness (generalized): Secondary | ICD-10-CM | POA: Insufficient documentation

## 2024-07-19 DIAGNOSIS — M47816 Spondylosis without myelopathy or radiculopathy, lumbar region: Secondary | ICD-10-CM | POA: Insufficient documentation

## 2024-07-19 DIAGNOSIS — G8929 Other chronic pain: Secondary | ICD-10-CM | POA: Insufficient documentation

## 2024-07-19 DIAGNOSIS — M5459 Other low back pain: Secondary | ICD-10-CM | POA: Diagnosis not present

## 2024-07-19 NOTE — Therapy (Signed)
 OUTPATIENT PHYSICAL THERAPY THORACOLUMBAR EVALUATION   Patient Name: Annalicia Renfrew MRN: [id number] DOB:1951/07/03, 73 y.o., female Today's Date: 07/19/2024  END OF SESSION:  PT End of Session - 07/19/24 1616     Visit Number 1    Number of Visits 17    Date for PT Re-Evaluation 09/13/24    Authorization Type medicare    Progress Note Due on Visit 10    PT Start Time 1615    PT Stop Time 1705    PT Time Calculation (min) 50 min          Past Medical History:  Diagnosis Date   Allergy 2014   latex rash   Arthritis    Cataract    Diabetes mellitus 11/22/2005   Diverticulitis    Hyperlipidemia    Hypertension    Obesity    Thyroid  disease    Past Surgical History:  Procedure Laterality Date   ABDOMINAL HYSTERECTOMY  2008   fibroids   BIOPSY BREAST Left 2007   benign calcification   BIOPSY THYROID   08/2023   BRAIN SURGERY  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,benign menigioma   Capsulotomy MPJ Right 12/31/2016   RT FOOT, 3rd MPJ   FOOT SURGERY  2004   hammer toe   Hammer Toe Repair Right 12/31/2016   RT #3, #5 toes   Patient Active Problem List   Diagnosis Date Noted   Lymph edema 06/21/2024   Multiple thyroid  nodules 07/19/2023   Pulsatile visual field defect 04/28/2023   Hearing loss of left ear 10/06/2020   Encounter for chronic pain management 01/30/2020   CKD stage G3a/A1, GFR 45-59 and albumin creatinine ratio <30 mg/g (HCC) 10/02/2019   Left foot pain 01/10/2015   Severe obesity (BMI >= 40) (HCC) 08/28/2014   Cataract 08/12/2014   Hypothyroidism 05/28/2014   Lumbar spondylosis 02/26/2014   HLD (hyperlipidemia) 02/01/2014   Essential hypertension 09/20/2011   Primary osteoarthritis of both knees 09/20/2011   Asymptomatic postmenopausal status 04/22/2010   TROCHANTERIC BURSITIS, RIGHT 12/03/2009   Goiter 04/01/2009   Diabetes mellitus (HCC) 04/01/2009    PCP: Alvan Dorothyann BIRCH, MD  REFERRING PROVIDER: Charles Redell LABOR, DO  REFERRING DIAG: [phone]  (ICD-10-CM) - Lumbar spondylosis M54.50,G89.29 (ICD-10-CM) - Chronic bilateral low back pain without sciatica  Rationale for Evaluation and Treatment: Rehabilitation  THERAPY DIAG:  Other low back pain  Muscle weakness (generalized)  ONSET DATE: 10-15 years or more  SUBJECTIVE:  SUBJECTIVE STATEMENT: Reports chronic back pain 10-15 years or so. Was managing with injections. Has done PT in the past with some success. Feels like it is getting stiffer over the past year or so, no change in activity or MOI. Feels like familiar symptoms just more stiff. Difficulty sleeping, pain/stiffness after heavier activities. Will infrequently get pain referring into legs, but no recent changes with this. Also has BIL knee pain.   PERTINENT HISTORY:  DM, HTN, thyroid  disease  PAIN:  Are you having pain: 3/10 Location/description: central low back, aching/stiff Best-worst over past week: 2-9/10 - aggravating factors: mowing lawn, sleeping, vacuuming, any household activities involving stooped posture, walking dog  - Easing factors: injections, heating pad    PRECAUTIONS: None  RED FLAGS: None No bowel/bladder symptoms, no N/T, no saddle anesthesia, no buckling, no fevers/chills  WEIGHT BEARING RESTRICTIONS: No  FALLS:  Has patient fallen in last 6 months? No - reports remote fall history, tripping over dog leash  LIVING ENVIRONMENT: Lives alone with her dog, [place] 1-2 STE, 1 level  No AD use  OCCUPATION: retired - worked in Air[employer] pilot  PLOF: Independent - enjoys spending time with her dog, playing piano, choir at [employer], being out in yard   PATIENT GOALS: get more limber, get moving more   NEXT MD VISIT: PCP in November per epic review, sports med PRN  OBJECTIVE:  Note: Objective measures were completed at  Evaluation unless otherwise noted.  DIAGNOSTIC FINDINGS:  No recent spine imaging in chart - Lumbar MRI 2023, refer to EPIC for details  PATIENT SURVEYS:  ODI: 15/50; 30%  COGNITION: Overall cognitive status: Within functional limits for tasks assessed     SENSATION/NEURO: Light touch intact BIL LE   No clonus either LE Negative hoffmann and tromner sign BIL No ataxia with gait   LUMBAR ROM:   AROM eval  Flexion Distal shin *  Extension 75% initially relieving, then painful  Right lateral flexion   Left lateral flexion   Right rotation 75% s *  Left rotation WNL   (Blank rows = not tested) (Key: WFL = within functional limits not formally assessed, * = concordant pain, s = stiffness/stretching sensation, NT = not tested) Comment:   LOWER EXTREMITY ROM:      Right eval Left eval  Hip flexion    Hip extension    Hip internal rotation    Hip external rotation    Knee extension    Knee flexion    (Blank rows = not tested) (Key: WFL = within functional limits not formally assessed, * = concordant pain, s = stiffness/stretching sensation, NT = not tested)  Comments:    LOWER EXTREMITY MMT:    MMT Right eval Left eval  Hip flexion 4 4  Hip abduction (modified sitting) 4+ 4+  Hip internal rotation 4+ 4  Hip external rotation 4+ 4  Knee flexion 4 4-  Knee extension 4+ 4+  Ankle dorsiflexion 5 5   (Blank rows = not tested) (Key: WFL = within functional limits not formally assessed, * = concordant pain, s = stiffness/stretching sensation, NT = not tested)  Comments:      FUNCTIONAL TESTS:  5xSTS: 17.35sec gentle UE support from thighs : 386ft no AD, back pain from 3/10 initially to5/10  GAIT: Distance walked: within clinic Assistive device utilized: None Level of assistance: Complete Independence Comments: widened BOS, reduced truncal rotation and arm swing    TREATMENT DATE:  [facility] Adult PT Treatment:  DATE: 07/19/24 Therapeutic Exercise: STS practice reps, seated adduction isometric, LTR x8 BIL; HEP handout + education, relevant anatomy/physiology and rationale for interventions, modification PRN  Self Care: Education/discussion re: exam findings as they relate to symptom behavior, PT POC                                                                                                                                  PATIENT EDUCATION:  Education details: Pt education on PT impairments, prognosis, and POC. Informed consent. Rationale for interventions, safe/appropriate HEP performance Person educated: Patient Education method: Explanation, Demonstration, Tactile cues, Verbal cues Education comprehension: verbalized understanding, returned demonstration, verbal cues required, tactile cues required, and needs further education    HOME EXERCISE PROGRAM: Access Code: [email or web address] URL: [email or web address] Date: 07/19/2024 Prepared by: Alm Jenny  Exercises - Supine Lower Trunk Rotation  - 2-3 x daily - 1 sets - 8 reps - Sit to Stand with Armchair  - 2-3 x daily - 1 sets - 5-8 reps - Seated Hip Adduction Isometrics with Ball  - 2-3 x daily - 1 sets - 8-10 reps  ASSESSMENT:  CLINICAL IMPRESSION: Patient is a very pleasant 73 y.o. woman who was seen today for physical therapy evaluation and treatment for chronic back pain. Endorses difficulty w/ repetitive tasks, household tasks, and prolonged ambulation. No red flags on exam today. Demonstrates concordant limitations with lumbar mobility and LE strength (particularly L hip). Fall risk and reduced functional mobility as indicated by 5xSTS time. Tolerates exam/HEP well overall, no adverse events. Notes relief w/ LTR and adduction iso. Recommend trial of skilled PT to address aforementioned deficits with aim of improving functional tolerance and reducing pain with typical activities. Pt departs today's session in no acute distress,  all voiced concerns/questions addressed appropriately from PT perspective.    OBJECTIVE IMPAIRMENTS: Abnormal gait, decreased activity tolerance, decreased endurance, decreased mobility, difficulty walking, decreased ROM, decreased strength, impaired perceived functional ability, improper body mechanics, postural dysfunction, and pain.   ACTIVITY LIMITATIONS: carrying, lifting, bending, sitting, standing, squatting, transfers, and locomotion level  PARTICIPATION LIMITATIONS: meal prep, cleaning, laundry, and community activity  PERSONAL FACTORS: Age, Time since onset of injury/illness/exacerbation, and 3+ comorbidities: DM, HTN, thyroid  disease, CKD3 are also affecting patient's functional outcome.   REHAB POTENTIAL: Good  CLINICAL DECISION MAKING: Stable/uncomplicated  EVALUATION COMPLEXITY: Low   GOALS:  SHORT TERM GOALS: Target date: 08/16/2024  Pt will demonstrate appropriate understanding and performance of initially prescribed HEP in order to facilitate improved independence with management of symptoms.  Baseline: HEP established  Goal status: INITIAL   2. Pt will report at least 25% improvement in overall pain levels over past week in order to facilitate improved tolerance to typical daily activities.   Baseline: 2-9/10  Goal status: INITIAL    LONG TERM GOALS: Target date: 09/13/2024   Pt will improve at least 15% on ODI in order to demonstrate improved perception of functional status  due to symptoms.  Baseline: 30% Goal status: INITIAL  2.  Pt will demonstrate symmetrical lumbar rotation AROM in order to demonstrate improved tolerance to functional movement patterns.  Baseline: see ROM chart above Goal status: INITIAL  3.  Pt will demonstrate grossly symmetrical MMT of at least 4+/5 in tested groups in order to demonstrate improved strength for functional movements.  Baseline: see MMT chart above Goal status: INITIAL  4. Pt will perform 5xSTS in </=14 sec in order  to demonstrate reduced fall risk and improved functional independence. (MCID of 2.3sec)  Baseline: 17sec gentle UE support  Goal status: INITIAL   5. Pt will report at least 50% decrease in overall pain levels in past week in order to facilitate improved tolerance to basic ADLs/mobility.   Baseline: 2-9/10  Goal status: INITIAL    6. Pt will improve to at least 455ft w/ less than 3/10 pain on NPS in order to indicate improved functional tolerance.   Baseline: 360ft no AD, 5/10 pain  Goal status: INITIAL  PLAN:  PT FREQUENCY: 1-2x/week  PT DURATION: 8 weeks  PLANNED INTERVENTIONS: 97164- PT Re-evaluation, 97750- Physical Performance Testing, 97110-Therapeutic exercises, 97530- Therapeutic activity, 97112- Neuromuscular re-education, 97535- Self Care, [id number]- Manual therapy, [phone]- Gait training, [phone]- Aquatic Therapy, [phone]- Electrical stimulation (unattended), [phone] (1-2 muscles), [phone] (3+ muscles)- Dry Needling, Patient/Family education, Balance training, Stair training, Taping, Joint mobilization, Spinal mobilization, Cryotherapy, and Moist heat.  PLAN FOR NEXT SESSION: Review/update HEP PRN. Work on [employer] exercises as appropriate with emphasis on gentle lumbar mobility, core/LE strength and endurance, introductory walking program. Symptom modification strategies as indicated/appropriate.    Alm DELENA Jenny PT, DPT 07/19/2024 5:22 PM

## 2024-07-24 ENCOUNTER — Encounter: Payer: Self-pay | Admitting: Sports Medicine

## 2024-07-31 ENCOUNTER — Encounter: Payer: Self-pay | Admitting: Physical Therapy

## 2024-07-31 ENCOUNTER — Ambulatory Visit: Admitting: Physical Therapy

## 2024-07-31 DIAGNOSIS — M5459 Other low back pain: Secondary | ICD-10-CM | POA: Diagnosis not present

## 2024-07-31 DIAGNOSIS — M6281 Muscle weakness (generalized): Secondary | ICD-10-CM | POA: Insufficient documentation

## 2024-07-31 NOTE — Therapy (Signed)
 OUTPATIENT PHYSICAL THERAPY TREATMENT   Patient Name: Regina Houston MRN: [id number] DOB:1951-07-28, 73 y.o., female Today's Date: 07/31/2024  END OF SESSION:  PT End of Session - 07/31/24 1358     Visit Number 2    Number of Visits 17    Date for PT Re-Evaluation 09/13/24    Authorization Type medicare    Progress Note Due on Visit 10    PT Start Time 1400    PT Stop Time 1442    PT Time Calculation (min) 42 min           Past Medical History:  Diagnosis Date   Allergy 2014   latex rash   Arthritis    Cataract    Diabetes mellitus 11/22/2005   Diverticulitis    Hyperlipidemia    Hypertension    Obesity    Thyroid  disease    Past Surgical History:  Procedure Laterality Date   ABDOMINAL HYSTERECTOMY  2008   fibroids   BIOPSY BREAST Left 2007   benign calcification   BIOPSY THYROID   08/2023   BRAIN SURGERY  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,benign menigioma   Capsulotomy MPJ Right 12/31/2016   RT FOOT, 3rd MPJ   FOOT SURGERY  2004   hammer toe   Hammer Toe Repair Right 12/31/2016   RT #3, #5 toes   Patient Active Problem List   Diagnosis Date Noted   Lymph edema 06/21/2024   Multiple thyroid  nodules 07/19/2023   Pulsatile visual field defect 04/28/2023   Hearing loss of left ear 10/06/2020   Encounter for chronic pain management 01/30/2020   CKD stage G3a/A1, GFR 45-59 and albumin creatinine ratio <30 mg/g (HCC) 10/02/2019   Left foot pain 01/10/2015   Severe obesity (BMI >= 40) (HCC) 08/28/2014   Cataract 08/12/2014   Hypothyroidism 05/28/2014   Lumbar spondylosis 02/26/2014   HLD (hyperlipidemia) 02/01/2014   Essential hypertension 09/20/2011   Primary osteoarthritis of both knees 09/20/2011   Asymptomatic postmenopausal status 04/22/2010   TROCHANTERIC BURSITIS, RIGHT 12/03/2009   Goiter 04/01/2009   Diabetes mellitus (HCC) 04/01/2009    PCP: Alvan Dorothyann BIRCH, MD  REFERRING PROVIDER: Charles Redell LABOR, DO  REFERRING DIAG: [phone] (ICD-10-CM) - Lumbar  spondylosis M54.50,G89.29 (ICD-10-CM) - Chronic bilateral low back pain without sciatica  Rationale for Evaluation and Treatment: Rehabilitation  THERAPY DIAG:  Other low back pain  Muscle weakness (generalized)  ONSET DATE: 10-15 years or more  SUBJECTIVE:  Per eval: Reports chronic back pain 10-15 years or so. Was managing with injections. Has done PT in the past with some success. Feels like it is getting stiffer over the past year or so, no change in activity or MOI. Feels like familiar symptoms just more stiff. Difficulty sleeping, pain/stiffness after heavier activities. Will infrequently get pain referring into legs, but no recent changes with this. Also has BIL knee pain.   SUBJECTIVE STATEMENT: 07/31/2024: states her back has been bothering her a bit more. Was mowing lawn, has done HEP some. Feels good with LTR and adduction but has difficulty with STS.    PERTINENT HISTORY:  DM, HTN, thyroid  disease  PAIN:  Are you having pain: 5/10 R sided low back   Per eval:  Location/description: central low back, aching/stiff Best-worst over past week: 2-9/10 - aggravating factors: mowing lawn, sleeping, vacuuming, any household activities involving stooped posture, walking dog  - Easing factors: injections, heating pad    PRECAUTIONS: None  RED FLAGS: None No bowel/bladder symptoms, no N/T, no saddle anesthesia, no buckling, no fevers/chills  WEIGHT BEARING RESTRICTIONS: No  FALLS:  Has patient fallen in last 6 months? No - reports remote fall history, tripping over dog leash  LIVING ENVIRONMENT: Lives alone with her dog, [place] 1-2 STE, 1 level  No AD use  OCCUPATION: retired - worked in [employer] pilot  PLOF: Independent - enjoys spending time with her dog, playing piano, choir at [employer], being out  in yard   PATIENT GOALS: get more limber, get moving more   NEXT MD VISIT: PCP in November per epic review, sports med PRN  OBJECTIVE:  Note: Objective measures were completed at Evaluation unless otherwise noted.  DIAGNOSTIC FINDINGS:  No recent spine imaging in chart - Lumbar MRI 2023, refer to EPIC for details  PATIENT SURVEYS:  ODI: 15/50; 30%  COGNITION: Overall cognitive status: Within functional limits for tasks assessed     SENSATION/NEURO: Light touch intact BIL LE   No clonus either LE Negative hoffmann and tromner sign BIL No ataxia with gait   LUMBAR ROM:   AROM eval  Flexion Distal shin *  Extension 75% initially relieving, then painful  Right lateral flexion   Left lateral flexion   Right rotation 75% s *  Left rotation WNL   (Blank rows = not tested) (Key: WFL = within functional limits not formally assessed, * = concordant pain, s = stiffness/stretching sensation, NT = not tested) Comment:   LOWER EXTREMITY ROM:      Right eval Left eval  Hip flexion    Hip extension    Hip internal rotation    Hip external rotation    Knee extension    Knee flexion    (Blank rows = not tested) (Key: WFL = within functional limits not formally assessed, * = concordant pain, s = stiffness/stretching sensation, NT = not tested)  Comments:    LOWER EXTREMITY MMT:    MMT Right eval Left eval  Hip flexion 4 4  Hip abduction (modified sitting) 4+ 4+  Hip internal rotation 4+ 4  Hip external rotation 4+ 4  Knee flexion 4 4-  Knee extension 4+ 4+  Ankle dorsiflexion 5 5   (Blank rows = not tested) (Key: WFL = within functional limits not formally assessed, * = concordant pain, s = stiffness/stretching sensation, NT = not tested)  Comments:      FUNCTIONAL TESTS:  5xSTS: 17.35sec gentle UE support from thighs : 369ft no AD,  back pain from 3/10 initially to5/10  GAIT: Distance walked: within clinic Assistive device utilized: None Level of  assistance: Complete Independence Comments: widened BOS, reduced truncal rotation and arm swing    TREATMENT DATE:  [facility] Adult PT Treatment:                                                DATE: 07/31/24 Therapeutic Exercise: Seated adduction iso x10 cues for posture and breath control Seated GB hip abduction 2x8  LTR x12 BIL  Mini SLR x8 BIL   Therapeutic Activity: Mini squat at treadmill w/ UE support 2x8 cues for trunk mechanics  4x30sec ambulation w/ 1 min rest, education on introductory walking program    [facility] Adult PT Treatment:                                                DATE: 07/19/24 Therapeutic Exercise: STS practice reps, seated adduction isometric, LTR x8 BIL; HEP handout + education, relevant anatomy/physiology and rationale for interventions, modification PRN  Self Care: Education/discussion re: exam findings as they relate to symptom behavior, PT POC                                                                                                                                  PATIENT EDUCATION:  Education details: rationale for interventions, HEP  Person educated: Patient Education method: Explanation, Demonstration, Tactile cues, Verbal cues Education comprehension: verbalized understanding, returned demonstration, verbal cues required, tactile cues required, and needs further education     HOME EXERCISE PROGRAM: Access Code: [email or web address] URL: [email or web address] Date: 07/31/2024 Prepared by: Alm Jenny  Exercises - Supine Lower Trunk Rotation  - 2-3 x daily - 1 sets - 8 reps - Seated Hip Adduction Isometrics with Ball  - 2-3 x daily - 1 sets - 8-10 reps - Mini Squat with Counter Support  - 2-3 x daily - 1 sets - 6-8 reps  ASSESSMENT:  CLINICAL IMPRESSION: 07/31/2024: Pt arrives w/ 5/10 pain, reporting some recent increase in symptom irritability. Today we continue building program, expanding to include hip strengthening and core stability.  Some hip irritability with adduction isometrics but improves w/ hip abd strengthening. Also working on mini squat variation given irritability w/ STS, noted improvement in tolerance. No adverse events, initially requires increased rest breaks given symptom irritability but improves as session goes on, reports reduced pain to <3/10 on departure. Recommend continuing along current POC in order to address relevant deficits and improve functional tolerance. Pt departs today's session in no acute distress, all voiced questions/concerns addressed appropriately from PT perspective.    Per eval: Patient is a very pleasant 73 y.o.  woman who was seen today for physical therapy evaluation and treatment for chronic back pain. Endorses difficulty w/ repetitive tasks, household tasks, and prolonged ambulation. No red flags on exam today. Demonstrates concordant limitations with lumbar mobility and LE strength (particularly L hip). Fall risk and reduced functional mobility as indicated by 5xSTS time. Tolerates exam/HEP well overall, no adverse events. Notes relief w/ LTR and adduction iso. Recommend trial of skilled PT to address aforementioned deficits with aim of improving functional tolerance and reducing pain with typical activities. Pt departs today's session in no acute distress, all voiced concerns/questions addressed appropriately from PT perspective.    OBJECTIVE IMPAIRMENTS: Abnormal gait, decreased activity tolerance, decreased endurance, decreased mobility, difficulty walking, decreased ROM, decreased strength, impaired perceived functional ability, improper body mechanics, postural dysfunction, and pain.   ACTIVITY LIMITATIONS: carrying, lifting, bending, sitting, standing, squatting, transfers, and locomotion level  PARTICIPATION LIMITATIONS: meal prep, cleaning, laundry, and community activity  PERSONAL FACTORS: Age, Time since onset of injury/illness/exacerbation, and 3+ comorbidities: DM, HTN, thyroid   disease, CKD3 are also affecting patient's functional outcome.   REHAB POTENTIAL: Good  CLINICAL DECISION MAKING: Stable/uncomplicated  EVALUATION COMPLEXITY: Low   GOALS:  SHORT TERM GOALS: Target date: 08/16/2024  Pt will demonstrate appropriate understanding and performance of initially prescribed HEP in order to facilitate improved independence with management of symptoms.  Baseline: HEP established  Goal status: INITIAL   2. Pt will report at least 25% improvement in overall pain levels over past week in order to facilitate improved tolerance to typical daily activities.   Baseline: 2-9/10  Goal status: INITIAL    LONG TERM GOALS: Target date: 09/13/2024   Pt will improve at least 15% on ODI in order to demonstrate improved perception of functional status due to symptoms.  Baseline: 30% Goal status: INITIAL  2.  Pt will demonstrate symmetrical lumbar rotation AROM in order to demonstrate improved tolerance to functional movement patterns.  Baseline: see ROM chart above Goal status: INITIAL  3.  Pt will demonstrate grossly symmetrical MMT of at least 4+/5 in tested groups in order to demonstrate improved strength for functional movements.  Baseline: see MMT chart above Goal status: INITIAL  4. Pt will perform 5xSTS in </=14 sec in order to demonstrate reduced fall risk and improved functional independence. (MCID of 2.3sec)  Baseline: 17sec gentle UE support  Goal status: INITIAL   5. Pt will report at least 50% decrease in overall pain levels in past week in order to facilitate improved tolerance to basic ADLs/mobility.   Baseline: 2-9/10  Goal status: INITIAL    6. Pt will improve to at least 456ft w/ less than 3/10 pain on NPS in order to indicate improved functional tolerance.   Baseline: 371ft no AD, 5/10 pain  Goal status: INITIAL  PLAN:  PT FREQUENCY: 1-2x/week  PT DURATION: 8 weeks  PLANNED INTERVENTIONS: 97164- PT Re-evaluation, 97750- Physical  Performance Testing, 97110-Therapeutic exercises, 97530- Therapeutic activity, 97112- Neuromuscular re-education, 97535- Self Care, [id number]- Manual therapy, [phone]- Gait training, [phone]- Aquatic Therapy, [phone]- Electrical stimulation (unattended), [phone] (1-2 muscles), [phone] (3+ muscles)- Dry Needling, Patient/Family education, Balance training, Stair training, Taping, Joint mobilization, Spinal mobilization, Cryotherapy, and Moist heat.  PLAN FOR NEXT SESSION: Review/update HEP PRN. Work on [employer] exercises as appropriate with emphasis on gentle lumbar mobility, core/LE strength and endurance, introductory walking program. Symptom modification strategies as indicated/appropriate.    Alm DELENA Jenny PT, DPT 07/31/2024 2:45 PM

## 2024-08-03 ENCOUNTER — Ambulatory Visit: Admitting: Physical Therapy

## 2024-08-03 ENCOUNTER — Encounter: Payer: Self-pay | Admitting: Physical Therapy

## 2024-08-03 ENCOUNTER — Telehealth: Payer: Self-pay

## 2024-08-03 DIAGNOSIS — M6281 Muscle weakness (generalized): Secondary | ICD-10-CM

## 2024-08-03 DIAGNOSIS — Z23 Encounter for immunization: Secondary | ICD-10-CM

## 2024-08-03 DIAGNOSIS — M5459 Other low back pain: Secondary | ICD-10-CM | POA: Diagnosis not present

## 2024-08-03 MED ORDER — COVID-19MRNA BIVAL VACC PFIZER 30 MCG/0.3ML IM SUSP: 0.3000 mL | Freq: Once | INTRAMUSCULAR | 0 refills | Status: AC

## 2024-08-03 NOTE — Telephone Encounter (Signed)
 Patient came into office to get COVID vaccine prescription sent to her pharmacy, please advise, thanks.

## 2024-08-03 NOTE — Telephone Encounter (Signed)
 Rx sent to pt's pharmacy

## 2024-08-03 NOTE — Therapy (Signed)
 OUTPATIENT PHYSICAL THERAPY TREATMENT   Patient Name: Kalia Vahey MRN: [id number] DOB:01-May-1951, 73 y.o., female Today's Date: 08/03/2024  END OF SESSION:  PT End of Session - 08/03/24 0846     Visit Number 3    Number of Visits 17    Date for PT Re-Evaluation 09/13/24    Authorization Type medicare    Progress Note Due on Visit 10    PT Start Time 0846    PT Stop Time 0929    PT Time Calculation (min) 43 min            Past Medical History:  Diagnosis Date   Allergy 2014   latex rash   Arthritis    Cataract    Diabetes mellitus 11/22/2005   Diverticulitis    Hyperlipidemia    Hypertension    Obesity    Thyroid  disease    Past Surgical History:  Procedure Laterality Date   ABDOMINAL HYSTERECTOMY  2008   fibroids   BIOPSY BREAST Left 2007   benign calcification   BIOPSY THYROID   08/2023   BRAIN SURGERY  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,benign menigioma   Capsulotomy MPJ Right 12/31/2016   RT FOOT, 3rd MPJ   FOOT SURGERY  2004   hammer toe   Hammer Toe Repair Right 12/31/2016   RT #3, #5 toes   Patient Active Problem List   Diagnosis Date Noted   Lymph edema 06/21/2024   Multiple thyroid  nodules 07/19/2023   Pulsatile visual field defect 04/28/2023   Hearing loss of left ear 10/06/2020   Encounter for chronic pain management 01/30/2020   CKD stage G3a/A1, GFR 45-59 and albumin creatinine ratio <30 mg/g (HCC) 10/02/2019   Left foot pain 01/10/2015   Severe obesity (BMI >= 40) (HCC) 08/28/2014   Cataract 08/12/2014   Hypothyroidism 05/28/2014   Lumbar spondylosis 02/26/2014   HLD (hyperlipidemia) 02/01/2014   Essential hypertension 09/20/2011   Primary osteoarthritis of both knees 09/20/2011   Asymptomatic postmenopausal status 04/22/2010   TROCHANTERIC BURSITIS, RIGHT 12/03/2009   Goiter 04/01/2009   Diabetes mellitus (HCC) 04/01/2009    PCP: Alvan Dorothyann BIRCH, MD  REFERRING PROVIDER: Charles Redell LABOR, DO  REFERRING DIAG: [id number] (ICD-10-CM) -  Lumbar spondylosis M54.50,G89.29 (ICD-10-CM) - Chronic bilateral low back pain without sciatica  Rationale for Evaluation and Treatment: Rehabilitation  THERAPY DIAG:  Other low back pain  Muscle weakness (generalized)  ONSET DATE: 10-15 years or more  SUBJECTIVE:  Per eval: Reports chronic back pain 10-15 years or so. Was managing with injections. Has done PT in the past with some success. Feels like it is getting stiffer over the past year or so, no change in activity or MOI. Feels like familiar symptoms just more stiff. Difficulty sleeping, pain/stiffness after heavier activities. Will infrequently get pain referring into legs, but no recent changes with this. Also has BIL knee pain.   SUBJECTIVE STATEMENT: 08/03/2024: did well after last session, no issues. Feels STS modification was very helpful. No other new updates.    PERTINENT HISTORY:  DM, HTN, thyroid  disease  PAIN:  Are you having pain: 4/10 low back, belt line  Per eval:  Location/description: central low back, aching/stiff Best-worst over past week: 2-9/10 - aggravating factors: mowing lawn, sleeping, vacuuming, any household activities involving stooped posture, walking dog  - Easing factors: injections, heating pad    PRECAUTIONS: None  RED FLAGS: None No bowel/bladder symptoms, no N/T, no saddle anesthesia, no buckling, no fevers/chills  WEIGHT BEARING RESTRICTIONS: No  FALLS:  Has patient fallen in last 6 months? No - reports remote fall history, tripping over dog leash  LIVING ENVIRONMENT: Lives alone with her dog, [place] 1-2 STE, 1 level  No AD use  OCCUPATION: retired - worked in [employer]  PLOF: Independent - enjoys spending time with her dog, playing piano, choir at [employer], being out in yard   PATIENT GOALS: get more  limber, get moving more   NEXT MD VISIT: PCP in November per epic review, sports med PRN  OBJECTIVE:  Note: Objective measures were completed at Evaluation unless otherwise noted.  DIAGNOSTIC FINDINGS:  No recent spine imaging in chart - Lumbar MRI 2023, refer to EPIC for details  PATIENT SURVEYS:  ODI: 15/50; 30%  COGNITION: Overall cognitive status: Within functional limits for tasks assessed     SENSATION/NEURO: Light touch intact BIL LE   No clonus either LE Negative hoffmann and tromner sign BIL No ataxia with gait   LUMBAR ROM:   AROM eval  Flexion Distal shin *  Extension 75% initially relieving, then painful  Right lateral flexion   Left lateral flexion   Right rotation 75% s *  Left rotation WNL   (Blank rows = not tested) (Key: WFL = within functional limits not formally assessed, * = concordant pain, s = stiffness/stretching sensation, NT = not tested) Comment:   LOWER EXTREMITY ROM:      Right eval Left eval  Hip flexion    Hip extension    Hip internal rotation    Hip external rotation    Knee extension    Knee flexion    (Blank rows = not tested) (Key: WFL = within functional limits not formally assessed, * = concordant pain, s = stiffness/stretching sensation, NT = not tested)  Comments:    LOWER EXTREMITY MMT:    MMT Right eval Left eval  Hip flexion 4 4  Hip abduction (modified sitting) 4+ 4+  Hip internal rotation 4+ 4  Hip external rotation 4+ 4  Knee flexion 4 4-  Knee extension 4+ 4+  Ankle dorsiflexion 5 5   (Blank rows = not tested) (Key: WFL = within functional limits not formally assessed, * = concordant pain, s = stiffness/stretching sensation, NT = not tested)  Comments:      FUNCTIONAL TESTS:  5xSTS: 17.35sec gentle UE support from thighs : 356ft no AD, back pain from 3/10 initially to5/10  GAIT: Distance walked: within clinic  Assistive device utilized: None Level of assistance: Complete  Independence Comments: widened BOS, reduced truncal rotation and arm swing    TREATMENT DATE:  [facility] Adult PT Treatment:                                                DATE: 08/03/24 Therapeutic Exercise: Blue band hip abduction; seated; 2x8 cues for foot positioning/alignment  Mini SLR x10 BIL  Butterfly stretch, supine 4x15sec LTR x12 BIL  HEP update + education/handout  Neuromuscular re-ed: Seated adduction iso unsupported 2x10 cues for posture, breath control, core contraction Paloff iso walkout, RB x8 each way cues for posture and core contraction  Therapeutic Activity: Blue band leg press x8 cues for alignment, pushing form Ambulation; 2 laps w/ rest; 1 bout each for CW/CCW around gym, with concurrent  and subsequent education on application to home walking program      [facility] Adult PT Treatment:                                                DATE: 07/31/24 Therapeutic Exercise: Seated adduction iso x10 cues for posture and breath control Seated GB hip abduction 2x8  LTR x12 BIL  Mini SLR x8 BIL   Therapeutic Activity: Mini squat at treadmill w/ UE support 2x8 cues for trunk mechanics  4x30sec ambulation w/ 1 min rest, education on introductory walking program   PATIENT EDUCATION:  Education details: rationale for interventions, HEP  Person educated: Patient Education method: Explanation, Demonstration, Tactile cues, Verbal cues Education comprehension: verbalized understanding, returned demonstration, verbal cues required, tactile cues required, and needs further education     HOME EXERCISE PROGRAM: Access Code: [email or web address] URL: [email or web address] Date: 08/03/2024 Prepared by: Alm Jenny  Exercises - Supine Lower Trunk Rotation  - 2-3 x daily - 1 sets - 8 reps - Seated Hip Adduction Isometrics with Ball  - 2-3 x daily - 1 sets - 8-10 reps - Mini Squat with Counter Support  - 2-3 x daily - 1 sets - 6-8 reps - Small Range Straight Leg Raise  -  2-3 x daily - 1 sets - 5 reps  ASSESSMENT:  CLINICAL IMPRESSION: 08/03/2024: Pt arrives w/ 4/10 pain, good response to last session. Does endorse some ankle soreness that began yesterday evening, has improved w/ activity this morning, encouraged monitoring. Continuing to progress well with activity emphasizing hip/core strength and stability, continued progression with introductory walking program. Tolerates session well with some mild muscular fatigue but no increase in resting pain, no adverse events. Recommend continuing along current POC in order to address relevant deficits and improve functional tolerance. Pt departs today's session in no acute distress, all voiced questions/concerns addressed appropriately from PT perspective.     Per eval: Patient is a very pleasant 73 y.o. woman who was seen today for physical therapy evaluation and treatment for chronic back pain. Endorses difficulty w/ repetitive tasks, household tasks, and prolonged ambulation. No red flags on exam today. Demonstrates concordant limitations with lumbar mobility and LE strength (particularly L hip). Fall risk and reduced functional mobility as indicated by 5xSTS time. Tolerates exam/HEP well overall, no adverse events. Notes relief w/ LTR and adduction iso. Recommend trial of skilled PT to address  aforementioned deficits with aim of improving functional tolerance and reducing pain with typical activities. Pt departs today's session in no acute distress, all voiced concerns/questions addressed appropriately from PT perspective.    OBJECTIVE IMPAIRMENTS: Abnormal gait, decreased activity tolerance, decreased endurance, decreased mobility, difficulty walking, decreased ROM, decreased strength, impaired perceived functional ability, improper body mechanics, postural dysfunction, and pain.   ACTIVITY LIMITATIONS: carrying, lifting, bending, sitting, standing, squatting, transfers, and locomotion level  PARTICIPATION LIMITATIONS:  meal prep, cleaning, laundry, and community activity  PERSONAL FACTORS: Age, Time since onset of injury/illness/exacerbation, and 3+ comorbidities: DM, HTN, thyroid  disease, CKD3 are also affecting patient's functional outcome.   REHAB POTENTIAL: Good  CLINICAL DECISION MAKING: Stable/uncomplicated  EVALUATION COMPLEXITY: Low   GOALS:  SHORT TERM GOALS: Target date: 08/16/2024  Pt will demonstrate appropriate understanding and performance of initially prescribed HEP in order to facilitate improved independence with management of symptoms.  Baseline: HEP established  Goal status: INITIAL   2. Pt will report at least 25% improvement in overall pain levels over past week in order to facilitate improved tolerance to typical daily activities.   Baseline: 2-9/10  Goal status: INITIAL    LONG TERM GOALS: Target date: 09/13/2024   Pt will improve at least 15% on ODI in order to demonstrate improved perception of functional status due to symptoms.  Baseline: 30% Goal status: INITIAL  2.  Pt will demonstrate symmetrical lumbar rotation AROM in order to demonstrate improved tolerance to functional movement patterns.  Baseline: see ROM chart above Goal status: INITIAL  3.  Pt will demonstrate grossly symmetrical MMT of at least 4+/5 in tested groups in order to demonstrate improved strength for functional movements.  Baseline: see MMT chart above Goal status: INITIAL  4. Pt will perform 5xSTS in </=14 sec in order to demonstrate reduced fall risk and improved functional independence. (MCID of 2.3sec)  Baseline: 17sec gentle UE support  Goal status: INITIAL   5. Pt will report at least 50% decrease in overall pain levels in past week in order to facilitate improved tolerance to basic ADLs/mobility.   Baseline: 2-9/10  Goal status: INITIAL    6. Pt will improve to at least 441ft w/ less than 3/10 pain on NPS in order to indicate improved functional tolerance.   Baseline: 331ft no  AD, 5/10 pain  Goal status: INITIAL  PLAN:  PT FREQUENCY: 1-2x/week  PT DURATION: 8 weeks  PLANNED INTERVENTIONS: 97164- PT Re-evaluation, 97750- Physical Performance Testing, 97110-Therapeutic exercises, 97530- Therapeutic activity, 97112- Neuromuscular re-education, 97535- Self Care, [id number]- Manual therapy, [phone]- Gait training, [phone]- Aquatic Therapy, [phone]- Electrical stimulation (unattended), [phone] (1-2 muscles), [phone] (3+ muscles)- Dry Needling, Patient/Family education, Balance training, Stair training, Taping, Joint mobilization, Spinal mobilization, Cryotherapy, and Moist heat.  PLAN FOR NEXT SESSION: Review/update HEP PRN. Work on [employer] exercises as appropriate with emphasis on gentle lumbar mobility, core/LE strength and endurance, introductory walking program. Symptom modification strategies as indicated/appropriate.    Alm DELENA Jenny PT, DPT 08/03/2024 9:40 AM

## 2024-08-03 NOTE — Telephone Encounter (Signed)
 So would she like a prescription??

## 2024-08-06 NOTE — Therapy (Signed)
 OUTPATIENT PHYSICAL THERAPY TREATMENT   Patient Name: Meekah Math MRN: [id number] DOB:02-25-1951, 73 y.o., female Today's Date: 08/07/2024  END OF SESSION:  PT End of Session - 08/07/24 1152     Visit Number 4    Number of Visits 17    Date for PT Re-Evaluation 09/13/24    Authorization Type medicare    Progress Note Due on Visit 10    PT Start Time 1152    PT Stop Time 1232    PT Time Calculation (min) 40 min             Past Medical History:  Diagnosis Date   Allergy 2014   latex rash   Arthritis    Cataract    Diabetes mellitus 11/22/2005   Diverticulitis    Hyperlipidemia    Hypertension    Obesity    Thyroid  disease    Past Surgical History:  Procedure Laterality Date   ABDOMINAL HYSTERECTOMY  2008   fibroids   BIOPSY BREAST Left 2007   benign calcification   BIOPSY THYROID   08/2023   BRAIN SURGERY  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,benign menigioma   Capsulotomy MPJ Right 12/31/2016   RT FOOT, 3rd MPJ   FOOT SURGERY  2004   hammer toe   Hammer Toe Repair Right 12/31/2016   RT #3, #5 toes   Patient Active Problem List   Diagnosis Date Noted   Lymph edema 06/21/2024   Multiple thyroid  nodules 07/19/2023   Pulsatile visual field defect 04/28/2023   Hearing loss of left ear 10/06/2020   Encounter for chronic pain management 01/30/2020   CKD stage G3a/A1, GFR 45-59 and albumin creatinine ratio <30 mg/g (HCC) 10/02/2019   Left foot pain 01/10/2015   Severe obesity (BMI >= 40) (HCC) 08/28/2014   Cataract 08/12/2014   Hypothyroidism 05/28/2014   Lumbar spondylosis 02/26/2014   HLD (hyperlipidemia) 02/01/2014   Essential hypertension 09/20/2011   Primary osteoarthritis of both knees 09/20/2011   Asymptomatic postmenopausal status 04/22/2010   TROCHANTERIC BURSITIS, RIGHT 12/03/2009   Goiter 04/01/2009   Diabetes mellitus (HCC) 04/01/2009    PCP: Alvan Dorothyann BIRCH, MD  REFERRING PROVIDER: Charles Redell LABOR, DO  REFERRING DIAG: [phone] (ICD-10-CM) -  Lumbar spondylosis M54.50,G89.29 (ICD-10-CM) - Chronic bilateral low back pain without sciatica  Rationale for Evaluation and Treatment: Rehabilitation  THERAPY DIAG:  Other low back pain  Muscle weakness (generalized)  ONSET DATE: 10-15 years or more  SUBJECTIVE:  Per eval: Reports chronic back pain 10-15 years or so. Was managing with injections. Has done PT in the past with some success. Feels like it is getting stiffer over the past year or so, no change in activity or MOI. Feels like familiar symptoms just more stiff. Difficulty sleeping, pain/stiffness after heavier activities. Will infrequently get pain referring into legs, but no recent changes with this. Also has BIL knee pain.   SUBJECTIVE STATEMENT: 08/07/2024: states R side of low back and hip is bothering her a bit after last session, also notes some irritability with the weather and taking dog to vet over the weekend. Feels like mini squats are getting easier. Walking in [place] is getting easier as well.    PERTINENT HISTORY:  DM, HTN, thyroid  disease  PAIN:  Are you having pain: 4/10 R>L low back/hip  Per eval:  Location/description: central low back, aching/stiff Best-worst over past week: 2-9/10 - aggravating factors: mowing lawn, sleeping, vacuuming, any household activities involving stooped posture, walking dog  - Easing factors: injections, heating pad    PRECAUTIONS: None  RED FLAGS: None No bowel/bladder symptoms, no N/T, no saddle anesthesia, no buckling, no fevers/chills  WEIGHT BEARING RESTRICTIONS: No  FALLS:  Has patient fallen in last 6 months? No - reports remote fall history, tripping over dog leash  LIVING ENVIRONMENT: Lives alone with her dog, [place] 1-2 STE, 1 level  No AD use  OCCUPATION: retired - worked  in [employer]  PLOF: Independent - enjoys spending time with her dog, playing piano, choir at [employer], being out in yard   PATIENT GOALS: get more limber, get moving more   NEXT MD VISIT: PCP in November per epic review, sports med PRN  OBJECTIVE:  Note: Objective measures were completed at Evaluation unless otherwise noted.  DIAGNOSTIC FINDINGS:  No recent spine imaging in chart - Lumbar MRI 2023, refer to EPIC for details  PATIENT SURVEYS:  ODI: 15/50; 30%  COGNITION: Overall cognitive status: Within functional limits for tasks assessed     SENSATION/NEURO: Light touch intact BIL LE   No clonus either LE Negative hoffmann and tromner sign BIL No ataxia with gait   LUMBAR ROM:   AROM eval  Flexion Distal shin *  Extension 75% initially relieving, then painful  Right lateral flexion   Left lateral flexion   Right rotation 75% s *  Left rotation WNL   (Blank rows = not tested) (Key: WFL = within functional limits not formally assessed, * = concordant pain, s = stiffness/stretching sensation, NT = not tested) Comment:   LOWER EXTREMITY ROM:      Right eval Left eval  Hip flexion    Hip extension    Hip internal rotation    Hip external rotation    Knee extension    Knee flexion    (Blank rows = not tested) (Key: WFL = within functional limits not formally assessed, * = concordant pain, s = stiffness/stretching sensation, NT = not tested)  Comments:    LOWER EXTREMITY MMT:    MMT Right eval Left eval  Hip flexion 4 4  Hip abduction (modified sitting) 4+ 4+  Hip internal rotation 4+ 4  Hip external rotation 4+ 4  Knee flexion 4 4-  Knee extension 4+ 4+  Ankle dorsiflexion 5 5   (Blank rows = not tested) (Key: WFL = within functional limits not formally assessed, * = concordant pain, s = stiffness/stretching sensation, NT = not tested)  Comments:  FUNCTIONAL TESTS:  5xSTS: 17.35sec gentle UE support from thighs : 344ft no AD, back pain from  3/10 initially to5/10  GAIT: Distance walked: within clinic Assistive device utilized: None Level of assistance: Complete Independence Comments: widened BOS, reduced truncal rotation and arm swing    TREATMENT DATE:  [facility] Adult PT Treatment:                                                DATE: 08/07/24  Neuromuscular re-ed: Hooklying post pelvic tilt 2x10 cues for breath control Hooklying marches + TA activation x15 BIL alternating  Mini-bridge + ball squeeze x8 cues for form and comfortable ROM RB paloff iso walkout x8 BIL cues for posture   Therapeutic Activity: Ambulation; 2 laps w/ rest; 1 bout each for CW/CCW around gym farmers carry 5# BIL  Mini squat w/ UE support x10 (cues for hip/trunk mechanics)   Self Care: Education re; symptom behavior, activity modification, walking program   [facility] Adult PT Treatment:                                                DATE: 08/03/24 Therapeutic Exercise: Blue band hip abduction; seated; 2x8 cues for foot positioning/alignment  Mini SLR x10 BIL  Butterfly stretch, supine 4x15sec LTR x12 BIL  HEP update + education/handout  Neuromuscular re-ed: Seated adduction iso unsupported 2x10 cues for posture, breath control, core contraction Paloff iso walkout, RB x8 each way cues for posture and core contraction  Therapeutic Activity: Blue band leg press x8 cues for alignment, pushing form Ambulation; 2 laps w/ rest; 1 bout each for CW/CCW around gym, with concurrent  and subsequent education on application to home walking program      [facility] Adult PT Treatment:                                                DATE: 07/31/24 Therapeutic Exercise: Seated adduction iso x10 cues for posture and breath control Seated GB hip abduction 2x8  LTR x12 BIL  Mini SLR x8 BIL   Therapeutic Activity: Mini squat at treadmill w/ UE support 2x8 cues for trunk mechanics  4x30sec ambulation w/ 1 min rest, education on introductory walking  program   PATIENT EDUCATION:  Education details: rationale for interventions, HEP  Person educated: Patient Education method: Explanation, Demonstration, Tactile cues, Verbal cues Education comprehension: verbalized understanding, returned demonstration, verbal cues required, tactile cues required, and needs further education     HOME EXERCISE PROGRAM: Access Code: [email or web address] URL: [email or web address] Date: 08/03/2024 Prepared by: Alm Jenny  Exercises - Supine Lower Trunk Rotation  - 2-3 x daily - 1 sets - 8 reps - Seated Hip Adduction Isometrics with Ball  - 2-3 x daily - 1 sets - 8-10 reps - Mini Squat with Counter Support  - 2-3 x daily - 1 sets - 6-8 reps - Small Range Straight Leg Raise  - 2-3 x daily - 1 sets - 5 reps  ASSESSMENT:  CLINICAL IMPRESSION: 08/07/2024: Pt arrives w/ 4/10 pain, mild low back/hip irritability after last session. Today  continuing to work on functional movements and activity tolerance at beginning of session with report of gradually improving R sided symptoms. Also working on core stability work in hooklying/supine positioning to mitigate muscular fatigue and compensations. No adverse events, reports resolution of pain by end of session. Recommend continuing along current POC in order to address relevant deficits and improve functional tolerance. Pt departs today's session in no acute distress, all voiced questions/concerns addressed appropriately from PT perspective.     Per eval: Patient is a very pleasant 73 y.o. woman who was seen today for physical therapy evaluation and treatment for chronic back pain. Endorses difficulty w/ repetitive tasks, household tasks, and prolonged ambulation. No red flags on exam today. Demonstrates concordant limitations with lumbar mobility and LE strength (particularly L hip). Fall risk and reduced functional mobility as indicated by 5xSTS time. Tolerates exam/HEP well overall, no adverse events. Notes relief  w/ LTR and adduction iso. Recommend trial of skilled PT to address aforementioned deficits with aim of improving functional tolerance and reducing pain with typical activities. Pt departs today's session in no acute distress, all voiced concerns/questions addressed appropriately from PT perspective.    OBJECTIVE IMPAIRMENTS: Abnormal gait, decreased activity tolerance, decreased endurance, decreased mobility, difficulty walking, decreased ROM, decreased strength, impaired perceived functional ability, improper body mechanics, postural dysfunction, and pain.   ACTIVITY LIMITATIONS: carrying, lifting, bending, sitting, standing, squatting, transfers, and locomotion level  PARTICIPATION LIMITATIONS: meal prep, cleaning, laundry, and community activity  PERSONAL FACTORS: Age, Time since onset of injury/illness/exacerbation, and 3+ comorbidities: DM, HTN, thyroid  disease, CKD3 are also affecting patient's functional outcome.   REHAB POTENTIAL: Good  CLINICAL DECISION MAKING: Stable/uncomplicated  EVALUATION COMPLEXITY: Low   GOALS:  SHORT TERM GOALS: Target date: 08/16/2024  Pt will demonstrate appropriate understanding and performance of initially prescribed HEP in order to facilitate improved independence with management of symptoms.  Baseline: HEP established  Goal status: INITIAL   2. Pt will report at least 25% improvement in overall pain levels over past week in order to facilitate improved tolerance to typical daily activities.   Baseline: 2-9/10  Goal status: INITIAL    LONG TERM GOALS: Target date: 09/13/2024   Pt will improve at least 15% on ODI in order to demonstrate improved perception of functional status due to symptoms.  Baseline: 30% Goal status: INITIAL  2.  Pt will demonstrate symmetrical lumbar rotation AROM in order to demonstrate improved tolerance to functional movement patterns.  Baseline: see ROM chart above Goal status: INITIAL  3.  Pt will demonstrate  grossly symmetrical MMT of at least 4+/5 in tested groups in order to demonstrate improved strength for functional movements.  Baseline: see MMT chart above Goal status: INITIAL  4. Pt will perform 5xSTS in </=14 sec in order to demonstrate reduced fall risk and improved functional independence. (MCID of 2.3sec)  Baseline: 17sec gentle UE support  Goal status: INITIAL   5. Pt will report at least 50% decrease in overall pain levels in past week in order to facilitate improved tolerance to basic ADLs/mobility.   Baseline: 2-9/10  Goal status: INITIAL    6. Pt will improve to at least 472ft w/ less than 3/10 pain on NPS in order to indicate improved functional tolerance.   Baseline: 39ft no AD, 5/10 pain  Goal status: INITIAL  PLAN:  PT FREQUENCY: 1-2x/week  PT DURATION: 8 weeks  PLANNED INTERVENTIONS: [id number]- PT Re-evaluation, 97750- Physical Performance Testing, 97110-Therapeutic exercises, 97530- Therapeutic activity, [id number]- Neuromuscular re-education, 97535-  Self Care, [id number]- Manual therapy, [id number]- Gait training, [phone]- Aquatic Therapy, [phone]- Electrical stimulation (unattended), [phone] (1-2 muscles), [phone] (3+ muscles)- Dry Needling, Patient/Family education, Balance training, Stair training, Taping, Joint mobilization, Spinal mobilization, Cryotherapy, and Moist heat.  PLAN FOR NEXT SESSION: Review/update HEP PRN. Work on [employer] exercises as appropriate with emphasis on gentle lumbar mobility, core/LE strength and endurance, introductory walking program. Symptom modification strategies as indicated/appropriate.    Alm DELENA Jenny PT, DPT 08/07/2024 12:40 PM

## 2024-08-07 ENCOUNTER — Encounter: Payer: Self-pay | Admitting: Physical Therapy

## 2024-08-07 ENCOUNTER — Ambulatory Visit: Admitting: Physical Therapy

## 2024-08-07 DIAGNOSIS — M5459 Other low back pain: Secondary | ICD-10-CM | POA: Diagnosis not present

## 2024-08-07 DIAGNOSIS — M6281 Muscle weakness (generalized): Secondary | ICD-10-CM

## 2024-08-09 ENCOUNTER — Encounter: Payer: Self-pay | Admitting: Physical Therapy

## 2024-08-09 ENCOUNTER — Ambulatory Visit: Admitting: Physical Therapy

## 2024-08-09 DIAGNOSIS — M5459 Other low back pain: Secondary | ICD-10-CM | POA: Diagnosis not present

## 2024-08-09 DIAGNOSIS — M6281 Muscle weakness (generalized): Secondary | ICD-10-CM

## 2024-08-09 NOTE — Therapy (Signed)
 OUTPATIENT PHYSICAL THERAPY TREATMENT   Patient Name: Regina Houston MRN: [id number] DOB:November 22, 1951, 73 y.o., female Today's Date: 08/09/2024  END OF SESSION:  PT End of Session - 08/09/24 1142     Visit Number 5    Number of Visits 17    Date for Recertification  09/13/24    Authorization Type medicare    Progress Note Due on Visit 10    PT Start Time 1143    PT Stop Time 1224    PT Time Calculation (min) 41 min              Past Medical History:  Diagnosis Date   Allergy 2014   latex rash   Arthritis    Cataract    Diabetes mellitus 11/22/2005   Diverticulitis    Hyperlipidemia    Hypertension    Obesity    Thyroid  disease    Past Surgical History:  Procedure Laterality Date   ABDOMINAL HYSTERECTOMY  2008   fibroids   BIOPSY BREAST Left 2007   benign calcification   BIOPSY THYROID   08/2023   BRAIN SURGERY  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,benign menigioma   Capsulotomy MPJ Right 12/31/2016   RT FOOT, 3rd MPJ   FOOT SURGERY  2004   hammer toe   Hammer Toe Repair Right 12/31/2016   RT #3, #5 toes   Patient Active Problem List   Diagnosis Date Noted   Lymph edema 06/21/2024   Multiple thyroid  nodules 07/19/2023   Pulsatile visual field defect 04/28/2023   Hearing loss of left ear 10/06/2020   Encounter for chronic pain management 01/30/2020   CKD stage G3a/A1, GFR 45-59 and albumin creatinine ratio <30 mg/g (HCC) 10/02/2019   Left foot pain 01/10/2015   Severe obesity (BMI >= 40) (HCC) 08/28/2014   Cataract 08/12/2014   Hypothyroidism 05/28/2014   Lumbar spondylosis 02/26/2014   HLD (hyperlipidemia) 02/01/2014   Essential hypertension 09/20/2011   Primary osteoarthritis of both knees 09/20/2011   Asymptomatic postmenopausal status 04/22/2010   TROCHANTERIC BURSITIS, RIGHT 12/03/2009   Goiter 04/01/2009   Diabetes mellitus (HCC) 04/01/2009    PCP: Alvan Dorothyann BIRCH, MD  REFERRING PROVIDER: Charles Redell LABOR, DO  REFERRING DIAG: [phone] (ICD-10-CM) -  Lumbar spondylosis M54.50,G89.29 (ICD-10-CM) - Chronic bilateral low back pain without sciatica  Rationale for Evaluation and Treatment: Rehabilitation  THERAPY DIAG:  Other low back pain  Muscle weakness (generalized)  ONSET DATE: 10-15 years or more  SUBJECTIVE:  Per eval: Reports chronic back pain 10-15 years or so. Was managing with injections. Has done PT in the past with some success. Feels like it is getting stiffer over the past year or so, no change in activity or MOI. Feels like familiar symptoms just more stiff. Difficulty sleeping, pain/stiffness after heavier activities. Will infrequently get pain referring into legs, but no recent changes with this. Also has BIL knee pain.   SUBJECTIVE STATEMENT: 08/09/2024: states she feels a little bit better than last session. R sided symptoms settled back down, overall about 2/10 at present R>L. Felt good after last session.     PERTINENT HISTORY:  DM, HTN, thyroid  disease  PAIN:  Are you having pain: 2/10 R>L low back   Per eval:  Location/description: central low back, aching/stiff Best-worst over past week: 2-9/10 - aggravating factors: mowing lawn, sleeping, vacuuming, any household activities involving stooped posture, walking dog  - Easing factors: injections, heating pad    PRECAUTIONS: None  RED FLAGS: None No bowel/bladder symptoms, no N/T, no saddle anesthesia, no buckling, no fevers/chills  WEIGHT BEARING RESTRICTIONS: No  FALLS:  Has patient fallen in last 6 months? No - reports remote fall history, tripping over dog leash  LIVING ENVIRONMENT: Lives alone with her dog, [place] 1-2 STE, 1 level  No AD use  OCCUPATION: retired - worked in [employer] pilot  PLOF: Independent - enjoys spending time with her dog, playing piano, choir at  [employer], being out in yard   PATIENT GOALS: get more limber, get moving more   NEXT MD VISIT: PCP in November per epic review, sports med PRN  OBJECTIVE:  Note: Objective measures were completed at Evaluation unless otherwise noted.  DIAGNOSTIC FINDINGS:  No recent spine imaging in chart - Lumbar MRI 2023, refer to EPIC for details  PATIENT SURVEYS:  ODI: 15/50; 30%  COGNITION: Overall cognitive status: Within functional limits for tasks assessed     SENSATION/NEURO: Light touch intact BIL LE   No clonus either LE Negative hoffmann and tromner sign BIL No ataxia with gait   LUMBAR ROM:   AROM eval  Flexion Distal shin *  Extension 75% initially relieving, then painful  Right lateral flexion   Left lateral flexion   Right rotation 75% s *  Left rotation WNL   (Blank rows = not tested) (Key: WFL = within functional limits not formally assessed, * = concordant pain, s = stiffness/stretching sensation, NT = not tested) Comment:   LOWER EXTREMITY ROM:      Right eval Left eval  Hip flexion    Hip extension    Hip internal rotation    Hip external rotation    Knee extension    Knee flexion    (Blank rows = not tested) (Key: WFL = within functional limits not formally assessed, * = concordant pain, s = stiffness/stretching sensation, NT = not tested)  Comments:    LOWER EXTREMITY MMT:    MMT Right eval Left eval  Hip flexion 4 4  Hip abduction (modified sitting) 4+ 4+  Hip internal rotation 4+ 4  Hip external rotation 4+ 4  Knee flexion 4 4-  Knee extension 4+ 4+  Ankle dorsiflexion 5 5   (Blank rows = not tested) (Key: WFL = within functional limits not formally assessed, * = concordant pain, s = stiffness/stretching sensation, NT = not tested)  Comments:      FUNCTIONAL TESTS:  5xSTS: 17.35sec gentle UE support from thighs : 316ft no AD, back  pain from 3/10 initially to5/10  08/09/24  - 372ft no AD, no increase in back pain (remains  2/10)  GAIT: Distance walked: within clinic Assistive device utilized: None Level of assistance: Complete Independence Comments: widened BOS, reduced truncal rotation and arm swing    TREATMENT DATE:  [facility] Adult PT Treatment:                                                DATE: 08/09/24 Therapeutic Exercise: SKTC 3x30sec BIL cues for comfortable ROM and breath control Seated hamstring stretch w/ heel prop 2x30sec BIL HEP update + education/handout   Neuromuscular re-ed: Hooklying TA march + red band 2x8 BIL cues for breath control and sequencing  SL hip abduction; RB; hooklying; 2x8 BIL   Therapeutic Activity: as warm up  2x158ft w 90 sec rest, 33sec for each rep, moderate effort Pacing of activities and rest breaks between walking bouts to mitigate symptoms and muscular fatigue Education on walking program progression given improved tolerance, recommending incorporating 1-2 quicker bouts of about 30 sec per day, discussed modification PRN based on response    [facility] Adult PT Treatment:                                                DATE: 08/07/24  Neuromuscular re-ed: Hooklying post pelvic tilt 2x10 cues for breath control Hooklying marches + TA activation x15 BIL alternating  Mini-bridge + ball squeeze x8 cues for form and comfortable ROM RB paloff iso walkout x8 BIL cues for posture   Therapeutic Activity: Ambulation; 2 laps w/ rest; 1 bout each for CW/CCW around gym farmers carry 5# BIL  Mini squat w/ UE support x10 (cues for hip/trunk mechanics)   Self Care: Education re; symptom behavior, activity modification, walking program   [facility] Adult PT Treatment:                                                DATE: 08/03/24 Therapeutic Exercise: Blue band hip abduction; seated; 2x8 cues for foot positioning/alignment  Mini SLR x10 BIL  Butterfly stretch, supine 4x15sec LTR x12 BIL  HEP update + education/handout  Neuromuscular re-ed: Seated adduction iso  unsupported 2x10 cues for posture, breath control, core contraction Paloff iso walkout, RB x8 each way cues for posture and core contraction  Therapeutic Activity: Blue band leg press x8 cues for alignment, pushing form Ambulation; 2 laps w/ rest; 1 bout each for CW/CCW around gym, with concurrent  and subsequent education on application to home walking program    PATIENT EDUCATION:  Education details: rationale for interventions, HEP  Person educated: Patient Education method: Explanation, Demonstration, Tactile cues, Verbal cues Education comprehension: verbalized understanding, returned demonstration, verbal cues required, tactile cues required, and needs further education     HOME EXERCISE PROGRAM: Access Code: [email or web address] URL: [email or web address] Date: 08/09/2024 Prepared by: Alm Jenny  Exercises - Supine Lower Trunk Rotation  - 2-3 x daily - 1 sets - 8 reps - Seated Hip Adduction Isometrics with Ball  - 2-3 x daily - 1 sets -  8-10 reps - Mini Squat with Counter Support  - 2-3 x daily - 1 sets - 6-8 reps - Small Range Straight Leg Raise  - 2-3 x daily - 1 sets - 5 reps - Supine March with Resistance Band  - 2-3 x daily - 1 sets - 8 reps - Hooklying Single Leg Bent Knee Fallouts with Resistance  - 2-3 x daily - 1 sets - 8 reps  ASSESSMENT:  CLINICAL IMPRESSION: 08/09/2024: Pt arrives w/ 2/10 pain. as warm up today with similar distance to eval but no increase in symptoms. Also incorporating higher effort walking for shorter distance which she does well with, education on incorporating into home program. Also progressing core/hip stability work and posterior chain mobility. Tolerates well with pain <3/10 on departure, no adverse events, HEP update as above. Recommend continuing along current POC in order to address relevant deficits and improve functional tolerance. Pt departs today's session in no acute distress, all voiced questions/concerns addressed  appropriately from PT perspective.      Per eval: Patient is a very pleasant 73 y.o. woman who was seen today for physical therapy evaluation and treatment for chronic back pain. Endorses difficulty w/ repetitive tasks, household tasks, and prolonged ambulation. No red flags on exam today. Demonstrates concordant limitations with lumbar mobility and LE strength (particularly L hip). Fall risk and reduced functional mobility as indicated by 5xSTS time. Tolerates exam/HEP well overall, no adverse events. Notes relief w/ LTR and adduction iso. Recommend trial of skilled PT to address aforementioned deficits with aim of improving functional tolerance and reducing pain with typical activities. Pt departs today's session in no acute distress, all voiced concerns/questions addressed appropriately from PT perspective.    OBJECTIVE IMPAIRMENTS: Abnormal gait, decreased activity tolerance, decreased endurance, decreased mobility, difficulty walking, decreased ROM, decreased strength, impaired perceived functional ability, improper body mechanics, postural dysfunction, and pain.   ACTIVITY LIMITATIONS: carrying, lifting, bending, sitting, standing, squatting, transfers, and locomotion level  PARTICIPATION LIMITATIONS: meal prep, cleaning, laundry, and community activity  PERSONAL FACTORS: Age, Time since onset of injury/illness/exacerbation, and 3+ comorbidities: DM, HTN, thyroid  disease, CKD3 are also affecting patient's functional outcome.   REHAB POTENTIAL: Good  CLINICAL DECISION MAKING: Stable/uncomplicated  EVALUATION COMPLEXITY: Low   GOALS:  SHORT TERM GOALS: Target date: 08/16/2024  Pt will demonstrate appropriate understanding and performance of initially prescribed HEP in order to facilitate improved independence with management of symptoms.  Baseline: HEP established  Goal status: INITIAL   2. Pt will report at least 25% improvement in overall pain levels over past week in order to  facilitate improved tolerance to typical daily activities.   Baseline: 2-9/10  Goal status: INITIAL    LONG TERM GOALS: Target date: 09/13/2024   Pt will improve at least 15% on ODI in order to demonstrate improved perception of functional status due to symptoms.  Baseline: 30% Goal status: INITIAL  2.  Pt will demonstrate symmetrical lumbar rotation AROM in order to demonstrate improved tolerance to functional movement patterns.  Baseline: see ROM chart above Goal status: INITIAL  3.  Pt will demonstrate grossly symmetrical MMT of at least 4+/5 in tested groups in order to demonstrate improved strength for functional movements.  Baseline: see MMT chart above Goal status: INITIAL  4. Pt will perform 5xSTS in </=14 sec in order to demonstrate reduced fall risk and improved functional independence. (MCID of 2.3sec)  Baseline: 17sec gentle UE support  Goal status: INITIAL   5. Pt will report at  least 50% decrease in overall pain levels in past week in order to facilitate improved tolerance to basic ADLs/mobility.   Baseline: 2-9/10  Goal status: INITIAL    6. Pt will improve to at least 461ft w/ less than 3/10 pain on NPS in order to indicate improved functional tolerance.   Baseline: 373ft no AD, 5/10 pain  Goal status: INITIAL  PLAN:  PT FREQUENCY: 1-2x/week  PT DURATION: 8 weeks  PLANNED INTERVENTIONS: 97164- PT Re-evaluation, 97750- Physical Performance Testing, 97110-Therapeutic exercises, 97530- Therapeutic activity, 97112- Neuromuscular re-education, 97535- Self Care, [id number]- Manual therapy, [phone]- Gait training, [phone]- Aquatic Therapy, [phone]- Electrical stimulation (unattended), [phone] (1-2 muscles), [phone] (3+ muscles)- Dry Needling, Patient/Family education, Balance training, Stair training, Taping, Joint mobilization, Spinal mobilization, Cryotherapy, and Moist heat.  PLAN FOR NEXT SESSION: Review/update HEP PRN. Work on [employer] exercises as appropriate with  emphasis on gentle lumbar mobility, core/LE strength and endurance, introductory walking program. Symptom modification strategies as indicated/appropriate.    Alm DELENA Jenny PT, DPT 08/09/2024 12:29 PM

## 2024-08-13 NOTE — Therapy (Signed)
 OUTPATIENT PHYSICAL THERAPY TREATMENT   Patient Name: Regina Houston MRN: [id number] DOB:Apr 13, 1951, 73 y.o., female Today's Date: 08/14/2024  END OF SESSION:  PT End of Session - 08/14/24 1404     Visit Number 6    Number of Visits 17    Date for Recertification  09/13/24    Authorization Type medicare    Progress Note Due on Visit 10    PT Start Time 1404    PT Stop Time 1444    PT Time Calculation (min) 40 min               Past Medical History:  Diagnosis Date   Allergy 2014   latex rash   Arthritis    Cataract    Diabetes mellitus 11/22/2005   Diverticulitis    Hyperlipidemia    Hypertension    Obesity    Thyroid  disease    Past Surgical History:  Procedure Laterality Date   ABDOMINAL HYSTERECTOMY  2008   fibroids   BIOPSY BREAST Left 2007   benign calcification   BIOPSY THYROID   08/2023   BRAIN SURGERY  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,benign menigioma   Capsulotomy MPJ Right 12/31/2016   RT FOOT, 3rd MPJ   FOOT SURGERY  2004   hammer toe   Hammer Toe Repair Right 12/31/2016   RT #3, #5 toes   Patient Active Problem List   Diagnosis Date Noted   Lymph edema 06/21/2024   Multiple thyroid  nodules 07/19/2023   Pulsatile visual field defect 04/28/2023   Hearing loss of left ear 10/06/2020   Encounter for chronic pain management 01/30/2020   CKD stage G3a/A1, GFR 45-59 and albumin creatinine ratio <30 mg/g (HCC) 10/02/2019   Left foot pain 01/10/2015   Severe obesity (BMI >= 40) (HCC) 08/28/2014   Cataract 08/12/2014   Hypothyroidism 05/28/2014   Lumbar spondylosis 02/26/2014   HLD (hyperlipidemia) 02/01/2014   Essential hypertension 09/20/2011   Primary osteoarthritis of both knees 09/20/2011   Asymptomatic postmenopausal status 04/22/2010   TROCHANTERIC BURSITIS, RIGHT 12/03/2009   Goiter 04/01/2009   Diabetes mellitus (HCC) 04/01/2009    PCP: Alvan Dorothyann BIRCH, MD  REFERRING PROVIDER: Charles Redell LABOR, DO  REFERRING DIAG: [phone] (ICD-10-CM) -  Lumbar spondylosis M54.50,G89.29 (ICD-10-CM) - Chronic bilateral low back pain without sciatica  Rationale for Evaluation and Treatment: Rehabilitation  THERAPY DIAG:  Other low back pain  Muscle weakness (generalized)  ONSET DATE: 10-15 years or more  SUBJECTIVE:  Per eval: Reports chronic back pain 10-15 years or so. Was managing with injections. Has done PT in the past with some success. Feels like it is getting stiffer over the past year or so, no change in activity or MOI. Feels like familiar symptoms just more stiff. Difficulty sleeping, pain/stiffness after heavier activities. Will infrequently get pain referring into legs, but no recent changes with this. Also has BIL knee pain.   SUBJECTIVE STATEMENT: 08/14/2024: was feeling pretty good today but did have some increased pain while putting together a raised bed for her dog. Felt a bit of strain on R side, about a 3/10 at present. Felt good after last session. Notes since starting PT she still has about the same amount of pain but feels like she can do more before she flares it up. Feels like walking is improving.     PERTINENT HISTORY:  DM, HTN, thyroid  disease  PAIN:  Are you having pain: 3/10 R>L low back, worst 4/10 in past week   Per eval:  Location/description: central low back, aching/stiff Best-worst over past week: 2-9/10 - aggravating factors: mowing lawn, sleeping, vacuuming, any household activities involving stooped posture, walking dog  - Easing factors: injections, heating pad    PRECAUTIONS: None  RED FLAGS: None No bowel/bladder symptoms, no N/T, no saddle anesthesia, no buckling, no fevers/chills  WEIGHT BEARING RESTRICTIONS: No  FALLS:  Has patient fallen in last 6 months? No - reports remote fall history, tripping over  dog leash  LIVING ENVIRONMENT: Lives alone with her dog, [place] 1-2 STE, 1 level  No AD use  OCCUPATION: retired - worked in [employer] pilot  PLOF: Independent - enjoys spending time with her dog, playing piano, choir at [employer], being out in yard   PATIENT GOALS: get more limber, get moving more   NEXT MD VISIT: PCP in November per epic review, sports med PRN  OBJECTIVE:  Note: Objective measures were completed at Evaluation unless otherwise noted.  DIAGNOSTIC FINDINGS:  No recent spine imaging in chart - Lumbar MRI 2023, refer to EPIC for details  PATIENT SURVEYS:  ODI: 15/50; 30%  COGNITION: Overall cognitive status: Within functional limits for tasks assessed     SENSATION/NEURO: Light touch intact BIL LE   No clonus either LE Negative hoffmann and tromner sign BIL No ataxia with gait   LUMBAR ROM:   AROM eval  Flexion Distal shin *  Extension 75% initially relieving, then painful  Right lateral flexion   Left lateral flexion   Right rotation 75% s *  Left rotation WNL   (Blank rows = not tested) (Key: WFL = within functional limits not formally assessed, * = concordant pain, s = stiffness/stretching sensation, NT = not tested) Comment:   LOWER EXTREMITY ROM:      Right eval Left eval  Hip flexion    Hip extension    Hip internal rotation    Hip external rotation    Knee extension    Knee flexion    (Blank rows = not tested) (Key: WFL = within functional limits not formally assessed, * = concordant pain, s = stiffness/stretching sensation, NT = not tested)  Comments:    LOWER EXTREMITY MMT:    MMT Right eval Left eval  Hip flexion 4 4  Hip abduction (modified sitting) 4+ 4+  Hip internal rotation 4+ 4  Hip external rotation 4+ 4  Knee flexion 4 4-  Knee extension 4+ 4+  Ankle dorsiflexion 5 5   (Blank rows =  not tested) (Key: WFL = within functional limits not formally assessed, * = concordant pain, s = stiffness/stretching sensation, NT = not  tested)  Comments:      FUNCTIONAL TESTS:  5xSTS: 17.35sec gentle UE support from thighs : 326ft no AD, back pain from 3/10 initially to5/10  08/09/24  - 35ft no AD, no increase in back pain (remains 2/10)  GAIT: Distance walked: within clinic Assistive device utilized: None Level of assistance: Complete Independence Comments: widened BOS, reduced truncal rotation and arm swing    TREATMENT DATE:  [facility] Adult PT Treatment:                                                DATE: 08/14/24 Therapeutic Exercise: Swiss ball rollout, seated x8 cues for trunk mechanics and comfortable ROM  Red band hip three way reach x5 BIL cues for form and sequencing  HEP update + education/handout  Neuromuscular re-ed: Dynadisc seated 3# chest press 2x10 cues for posture and core activation Dynadisc seated march x12 BIL  Therapeutic Activity: 5# farmer carry ; 8# farmer carry 2x68min cues for pacing, 1 min break between each set  Continued education/discussion re: progression of walking program within tolerance     [facility] Adult PT Treatment:                                                DATE: 08/09/24 Therapeutic Exercise: SKTC 3x30sec BIL cues for comfortable ROM and breath control Seated hamstring stretch w/ heel prop 2x30sec BIL HEP update + education/handout   Neuromuscular re-ed: Hooklying TA march + red band 2x8 BIL cues for breath control and sequencing  SL hip abduction; RB; hooklying; 2x8 BIL   Therapeutic Activity: as warm up  2x176ft w 90 sec rest, 33sec for each rep, moderate effort Pacing of activities and rest breaks between walking bouts to mitigate symptoms and muscular fatigue Education on walking program progression given improved tolerance, recommending incorporating 1-2 quicker bouts of about 30 sec per day, discussed modification PRN based on response    [facility] Adult PT Treatment:                                                DATE:  08/07/24  Neuromuscular re-ed: Hooklying post pelvic tilt 2x10 cues for breath control Hooklying marches + TA activation x15 BIL alternating  Mini-bridge + ball squeeze x8 cues for form and comfortable ROM RB paloff iso walkout x8 BIL cues for posture   Therapeutic Activity: Ambulation; 2 laps w/ rest; 1 bout each for CW/CCW around gym farmers carry 5# BIL  Mini squat w/ UE support x10 (cues for hip/trunk mechanics)   Self Care: Education re; symptom behavior, activity modification, walking program   [facility] Adult PT Treatment:                                                DATE: 08/03/24 Therapeutic Exercise: [place]  band hip abduction; seated; 2x8 cues for foot positioning/alignment  Mini SLR x10 BIL  Butterfly stretch, supine 4x15sec LTR x12 BIL  HEP update + education/handout  Neuromuscular re-ed: Seated adduction iso unsupported 2x10 cues for posture, breath control, core contraction Paloff iso walkout, RB x8 each way cues for posture and core contraction  Therapeutic Activity: Blue band leg press x8 cues for alignment, pushing form Ambulation; 2 laps w/ rest; 1 bout each for CW/CCW around gym, with concurrent  and subsequent education on application to home walking program    PATIENT EDUCATION:  Education details: rationale for interventions, HEP  Person educated: Patient Education method: Explanation, Demonstration, Tactile cues, Verbal cues Education comprehension: verbalized understanding, returned demonstration, verbal cues required, tactile cues required, and needs further education     HOME EXERCISE PROGRAM: Access Code: [email or web address] URL: [email or web address] Date: 08/14/2024 Prepared by: Alm Jenny  Exercises - Supine Lower Trunk Rotation  - 2-3 x daily - 1 sets - 8 reps - Seated Hip Adduction Isometrics with Ball  - 2-3 x daily - 1 sets - 8-10 reps - Mini Squat with Counter Support  - 2-3 x daily - 1 sets - 6-8 reps - Small Range  Straight Leg Raise  - 2-3 x daily - 1 sets - 5 reps - Standing 3-Way Leg Reach with Resistance at Ankles and Counter Support  - 2-3 x daily - 1 sets - 5 reps - Seated March  - 2-3 x daily - 1 sets - 10 reps  ASSESSMENT:  CLINICAL IMPRESSION: 08/14/2024: Pt arrives w/ report of improving pain and functional tolerance overall since start of care. Today continuing to progress functional carries and standing activities which she does well with. Most fatigue with progression to three way hip reach. Overall tolerates session well but does endorse mild increase in pain from 3/10 on arrival to 4/10 by end of session. No adverse events. Recommend continuing along current POC in order to address relevant deficits and improve functional tolerance. Pt departs today's session in no acute distress, all voiced questions/concerns addressed appropriately from PT perspective.     Per eval: Patient is a very pleasant 73 y.o. woman who was seen today for physical therapy evaluation and treatment for chronic back pain. Endorses difficulty w/ repetitive tasks, household tasks, and prolonged ambulation. No red flags on exam today. Demonstrates concordant limitations with lumbar mobility and LE strength (particularly L hip). Fall risk and reduced functional mobility as indicated by 5xSTS time. Tolerates exam/HEP well overall, no adverse events. Notes relief w/ LTR and adduction iso. Recommend trial of skilled PT to address aforementioned deficits with aim of improving functional tolerance and reducing pain with typical activities. Pt departs today's session in no acute distress, all voiced concerns/questions addressed appropriately from PT perspective.    OBJECTIVE IMPAIRMENTS: Abnormal gait, decreased activity tolerance, decreased endurance, decreased mobility, difficulty walking, decreased ROM, decreased strength, impaired perceived functional ability, improper body mechanics, postural dysfunction, and pain.   ACTIVITY  LIMITATIONS: carrying, lifting, bending, sitting, standing, squatting, transfers, and locomotion level  PARTICIPATION LIMITATIONS: meal prep, cleaning, laundry, and community activity  PERSONAL FACTORS: Age, Time since onset of injury/illness/exacerbation, and 3+ comorbidities: DM, HTN, thyroid  disease, CKD3 are also affecting patient's functional outcome.   REHAB POTENTIAL: Good  CLINICAL DECISION MAKING: Stable/uncomplicated  EVALUATION COMPLEXITY: Low   GOALS:  SHORT TERM GOALS: Target date: 08/16/2024  Pt will demonstrate appropriate understanding and performance of initially prescribed HEP in order to facilitate improved independence with management of  symptoms.  Baseline: HEP established  08/14/24: reports good HEP performance Goal status: MET  2. Pt will report at least 25% improvement in overall pain levels over past week in order to facilitate improved tolerance to typical daily activities.   Baseline: 2-9/10 08/14/24: 0-4/10 Goal status: MET  LONG TERM GOALS: Target date: 09/13/2024   Pt will improve at least 15% on ODI in order to demonstrate improved perception of functional status due to symptoms.  Baseline: 30% Goal status: INITIAL  2.  Pt will demonstrate symmetrical lumbar rotation AROM in order to demonstrate improved tolerance to functional movement patterns.  Baseline: see ROM chart above Goal status: INITIAL  3.  Pt will demonstrate grossly symmetrical MMT of at least 4+/5 in tested groups in order to demonstrate improved strength for functional movements.  Baseline: see MMT chart above Goal status: INITIAL  4. Pt will perform 5xSTS in </=14 sec in order to demonstrate reduced fall risk and improved functional independence. (MCID of 2.3sec)  Baseline: 17sec gentle UE support  Goal status: INITIAL   5. Pt will report at least 50% decrease in overall pain levels in past week in order to facilitate improved tolerance to basic ADLs/mobility.   Baseline:  2-9/10  Goal status: INITIAL    6. Pt will improve to at least 411ft w/ less than 3/10 pain on NPS in order to indicate improved functional tolerance.   Baseline: 32ft no AD, 5/10 pain  Goal status: INITIAL  PLAN:  PT FREQUENCY: 1-2x/week  PT DURATION: 8 weeks  PLANNED INTERVENTIONS: 97164- PT Re-evaluation, 97750- Physical Performance Testing, 97110-Therapeutic exercises, 97530- Therapeutic activity, 97112- Neuromuscular re-education, 97535- Self Care, [id number]- Manual therapy, [phone]- Gait training, [phone]- Aquatic Therapy, [phone]- Electrical stimulation (unattended), [phone] (1-2 muscles), [phone] (3+ muscles)- Dry Needling, Patient/Family education, Balance training, Stair training, Taping, Joint mobilization, Spinal mobilization, Cryotherapy, and Moist heat.  PLAN FOR NEXT SESSION: Review/update HEP PRN. Work on [employer] exercises as appropriate with emphasis on gentle lumbar mobility, core/LE strength and endurance, introductory walking program. Symptom modification strategies as indicated/appropriate.    Alm DELENA Jenny PT, DPT 08/14/2024 2:48 PM

## 2024-08-14 ENCOUNTER — Ambulatory Visit: Admitting: Physical Therapy

## 2024-08-14 ENCOUNTER — Encounter: Payer: Self-pay | Admitting: Physical Therapy

## 2024-08-14 DIAGNOSIS — M6281 Muscle weakness (generalized): Secondary | ICD-10-CM

## 2024-08-14 DIAGNOSIS — M5459 Other low back pain: Secondary | ICD-10-CM | POA: Diagnosis not present

## 2024-08-16 ENCOUNTER — Encounter: Payer: Self-pay | Admitting: Physical Therapy

## 2024-08-16 ENCOUNTER — Ambulatory Visit: Admitting: Physical Therapy

## 2024-08-16 DIAGNOSIS — M5459 Other low back pain: Secondary | ICD-10-CM

## 2024-08-16 DIAGNOSIS — M6281 Muscle weakness (generalized): Secondary | ICD-10-CM | POA: Diagnosis not present

## 2024-08-16 NOTE — Therapy (Signed)
 OUTPATIENT PHYSICAL THERAPY TREATMENT   Patient Name: Regina Houston MRN: [id number] DOB:Apr 04, 1951, 73 y.o., female Today's Date: 08/16/2024  END OF SESSION:  PT End of Session - 08/16/24 1356     Visit Number 7    Number of Visits 17    Date for Recertification  09/13/24    Authorization Type medicare    Progress Note Due on Visit 10    PT Start Time 1357    PT Stop Time 1445    PT Time Calculation (min) 48 min                Past Medical History:  Diagnosis Date   Allergy 2014   latex rash   Arthritis    Cataract    Diabetes mellitus 11/22/2005   Diverticulitis    Hyperlipidemia    Hypertension    Obesity    Thyroid  disease    Past Surgical History:  Procedure Laterality Date   ABDOMINAL HYSTERECTOMY  2008   fibroids   BIOPSY BREAST Left 2007   benign calcification   BIOPSY THYROID   08/2023   BRAIN SURGERY  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,benign menigioma   Capsulotomy MPJ Right 12/31/2016   RT FOOT, 3rd MPJ   FOOT SURGERY  2004   hammer toe   Hammer Toe Repair Right 12/31/2016   RT #3, #5 toes   Patient Active Problem List   Diagnosis Date Noted   Lymph edema 06/21/2024   Multiple thyroid  nodules 07/19/2023   Pulsatile visual field defect 04/28/2023   Hearing loss of left ear 10/06/2020   Encounter for chronic pain management 01/30/2020   CKD stage G3a/A1, GFR 45-59 and albumin creatinine ratio <30 mg/g (HCC) 10/02/2019   Left foot pain 01/10/2015   Severe obesity (BMI >= 40) (HCC) 08/28/2014   Cataract 08/12/2014   Hypothyroidism 05/28/2014   Lumbar spondylosis 02/26/2014   HLD (hyperlipidemia) 02/01/2014   Essential hypertension 09/20/2011   Primary osteoarthritis of both knees 09/20/2011   Asymptomatic postmenopausal status 04/22/2010   TROCHANTERIC BURSITIS, RIGHT 12/03/2009   Goiter 04/01/2009   Diabetes mellitus (HCC) 04/01/2009    PCP: Alvan Dorothyann BIRCH, MD  REFERRING PROVIDER: Charles Redell LABOR, DO  REFERRING DIAG: [phone] (ICD-10-CM)  - Lumbar spondylosis M54.50,G89.29 (ICD-10-CM) - Chronic bilateral low back pain without sciatica  Rationale for Evaluation and Treatment: Rehabilitation  THERAPY DIAG:  Other low back pain  Muscle weakness (generalized)  ONSET DATE: 10-15 years or more  SUBJECTIVE:  Per eval: Reports chronic back pain 10-15 years or so. Was managing with injections. Has done PT in the past with some success. Feels like it is getting stiffer over the past year or so, no change in activity or MOI. Feels like familiar symptoms just more stiff. Difficulty sleeping, pain/stiffness after heavier activities. Will infrequently get pain referring into legs, but no recent changes with this. Also has BIL knee pain.   SUBJECTIVE STATEMENT: 08/16/2024: back has been pretty good but notes that she push-mowed the lawn yesterday and so it is cranky today. A bit more painful beltline BIL, R>L. Did well after last session, a bit of soreness but not significant.      PERTINENT HISTORY:  DM, HTN, thyroid  disease  PAIN:  Are you having pain: 3/10 R>L low back  Per eval:  Location/description: central low back, aching/stiff Best-worst over past week: 2-9/10 - aggravating factors: mowing lawn, sleeping, vacuuming, any household activities involving stooped posture, walking dog  - Easing factors: injections, heating pad    PRECAUTIONS: None  RED FLAGS: None No bowel/bladder symptoms, no N/T, no saddle anesthesia, no buckling, no fevers/chills  WEIGHT BEARING RESTRICTIONS: No  FALLS:  Has patient fallen in last 6 months? No - reports remote fall history, tripping over dog leash  LIVING ENVIRONMENT: Lives alone with her dog, [place] 1-2 STE, 1 level  No AD use  OCCUPATION: retired - worked in [employer] pilot  PLOF: Independent - enjoys  spending time with her dog, playing piano, choir at [employer], being out in yard   PATIENT GOALS: get more limber, get moving more   NEXT MD VISIT: PCP in November per epic review, sports med PRN  OBJECTIVE:  Note: Objective measures were completed at Evaluation unless otherwise noted.  DIAGNOSTIC FINDINGS:  No recent spine imaging in chart - Lumbar MRI 2023, refer to EPIC for details  PATIENT SURVEYS:  ODI: 15/50; 30%  COGNITION: Overall cognitive status: Within functional limits for tasks assessed     SENSATION/NEURO: Light touch intact BIL LE   No clonus either LE Negative hoffmann and tromner sign BIL No ataxia with gait   LUMBAR ROM:   AROM eval  Flexion Distal shin *  Extension 75% initially relieving, then painful  Right lateral flexion   Left lateral flexion   Right rotation 75% s *  Left rotation WNL   (Blank rows = not tested) (Key: WFL = within functional limits not formally assessed, * = concordant pain, s = stiffness/stretching sensation, NT = not tested) Comment:   LOWER EXTREMITY ROM:      Right eval Left eval  Hip flexion    Hip extension    Hip internal rotation    Hip external rotation    Knee extension    Knee flexion    (Blank rows = not tested) (Key: WFL = within functional limits not formally assessed, * = concordant pain, s = stiffness/stretching sensation, NT = not tested)  Comments:    LOWER EXTREMITY MMT:    MMT Right eval Left eval  Hip flexion 4 4  Hip abduction (modified sitting) 4+ 4+  Hip internal rotation 4+ 4  Hip external rotation 4+ 4  Knee flexion 4 4-  Knee extension 4+ 4+  Ankle dorsiflexion 5 5   (Blank rows = not tested) (Key: WFL = within functional limits not formally assessed, * = concordant pain, s = stiffness/stretching sensation, NT = not tested)  Comments:      FUNCTIONAL TESTS:  5xSTS:  17.35sec gentle UE support from thighs : 342ft no AD, back pain from 3/10 initially to5/10  08/09/24  -  333ft no AD, no increase in back pain (remains 2/10)  GAIT: Distance walked: within clinic Assistive device utilized: None Level of assistance: Complete Independence Comments: widened BOS, reduced truncal rotation and arm swing    TREATMENT:   [facility] Adult PT Treatment:                                                DATE: 08/16/24 Therapeutic Exercise: RB 3 way reach x5 BIL cues for posture and sequencing Blue band row 2x12 cues for form  Swiss ball rollout fwd, x10 cues for trunk mechanics HEP discussion/education  Neuromuscular re-ed: Airex shoulder ext GB 3x8 cues for core activation Dynadisc 3# OH press, seated; 2x10 cues for posture and short lever   Therapeutic Activity: 1:30 8# BIL farmer carry, 1 min rest between (got nearly 4 laps on the second)  5# KB pick up from 12 inch step cues for hinge and form x10  Education/discussion re: walking program progression as indicated    [facility] Adult PT Treatment:                                                DATE: 08/14/24 Therapeutic Exercise: Swiss ball rollout, seated x8 cues for trunk mechanics and comfortable ROM  Red band hip three way reach x5 BIL cues for form and sequencing  HEP update + education/handout  Neuromuscular re-ed: Dynadisc seated 3# chest press 2x10 cues for posture and core activation Dynadisc seated march x12 BIL  Therapeutic Activity: 5# farmer carry ; 8# farmer carry 2x39min cues for pacing, 1 min break between each set  Continued education/discussion re: progression of walking program within tolerance     [facility] Adult PT Treatment:                                                DATE: 08/09/24 Therapeutic Exercise: SKTC 3x30sec BIL cues for comfortable ROM and breath control Seated hamstring stretch w/ heel prop 2x30sec BIL HEP update + education/handout   Neuromuscular re-ed: Hooklying TA march + red band 2x8 BIL cues for breath control and sequencing  SL hip abduction; RB; hooklying; 2x8 BIL    Therapeutic Activity: as warm up  2x171ft w 90 sec rest, 33sec for each rep, moderate effort Pacing of activities and rest breaks between walking bouts to mitigate symptoms and muscular fatigue Education on walking program progression given improved tolerance, recommending incorporating 1-2 quicker bouts of about 30 sec per day, discussed modification PRN based on response    [facility] Adult PT Treatment:                                                DATE: 08/07/24  Neuromuscular re-ed: Hooklying post pelvic tilt 2x10 cues for breath control Hooklying marches + TA activation x15 BIL alternating  Mini-bridge +  ball squeeze x8 cues for form and comfortable ROM RB paloff iso walkout x8 BIL cues for posture   Therapeutic Activity: Ambulation; 2 laps w/ rest; 1 bout each for CW/CCW around gym farmers carry 5# BIL  Mini squat w/ UE support x10 (cues for hip/trunk mechanics)   Self Care: Education re; symptom behavior, activity modification, walking program   PATIENT EDUCATION:  Education details: rationale for interventions, HEP  Person educated: Patient Education method: Explanation, Demonstration, Tactile cues, Verbal cues Education comprehension: verbalized understanding, returned demonstration, verbal cues required, tactile cues required, and needs further education     HOME EXERCISE PROGRAM: Access Code: [email or web address] URL: [email or web address] Date: 08/14/2024 Prepared by: Alm Jenny  Exercises - Supine Lower Trunk Rotation  - 2-3 x daily - 1 sets - 8 reps - Seated Hip Adduction Isometrics with Ball  - 2-3 x daily - 1 sets - 8-10 reps - Mini Squat with Counter Support  - 2-3 x daily - 1 sets - 6-8 reps - Small Range Straight Leg Raise  - 2-3 x daily - 1 sets - 5 reps - Standing 3-Way Leg Reach with Resistance at Ankles and Counter Support  - 2-3 x daily - 1 sets - 5 reps - Seated March  - 2-3 x daily - 1 sets - 10 reps  ASSESSMENT:  CLINICAL  IMPRESSION: 08/16/2024: Pt arrives w/ report of continued progress. Today continuing to work on functional strengthening with carries and introduction of KB pickup from raised surface. Also working on core endurance/stability. Tolerates well with report of some fatigue by end of session which is relieved w/ swiss ball rollout. No adverse events. Recommend continuing along current POC in order to address relevant deficits and improve functional tolerance. Pt departs today's session in no acute distress, all voiced questions/concerns addressed appropriately from PT perspective.     Per eval: Patient is a very pleasant 73 y.o. woman who was seen today for physical therapy evaluation and treatment for chronic back pain. Endorses difficulty w/ repetitive tasks, household tasks, and prolonged ambulation. No red flags on exam today. Demonstrates concordant limitations with lumbar mobility and LE strength (particularly L hip). Fall risk and reduced functional mobility as indicated by 5xSTS time. Tolerates exam/HEP well overall, no adverse events. Notes relief w/ LTR and adduction iso. Recommend trial of skilled PT to address aforementioned deficits with aim of improving functional tolerance and reducing pain with typical activities. Pt departs today's session in no acute distress, all voiced concerns/questions addressed appropriately from PT perspective.    OBJECTIVE IMPAIRMENTS: Abnormal gait, decreased activity tolerance, decreased endurance, decreased mobility, difficulty walking, decreased ROM, decreased strength, impaired perceived functional ability, improper body mechanics, postural dysfunction, and pain.   ACTIVITY LIMITATIONS: carrying, lifting, bending, sitting, standing, squatting, transfers, and locomotion level  PARTICIPATION LIMITATIONS: meal prep, cleaning, laundry, and community activity  PERSONAL FACTORS: Age, Time since onset of injury/illness/exacerbation, and 3+ comorbidities: DM, HTN, thyroid   disease, CKD3 are also affecting patient's functional outcome.   REHAB POTENTIAL: Good  CLINICAL DECISION MAKING: Stable/uncomplicated  EVALUATION COMPLEXITY: Low   GOALS:  SHORT TERM GOALS: Target date: 08/16/2024  Pt will demonstrate appropriate understanding and performance of initially prescribed HEP in order to facilitate improved independence with management of symptoms.  Baseline: HEP established  08/14/24: reports good HEP performance Goal status: MET  2. Pt will report at least 25% improvement in overall pain levels over past week in order to facilitate improved tolerance to typical daily activities.   Baseline:  2-9/10 08/14/24: 0-4/10 Goal status: MET  LONG TERM GOALS: Target date: 09/13/2024   Pt will improve at least 15% on ODI in order to demonstrate improved perception of functional status due to symptoms.  Baseline: 30% Goal status: INITIAL  2.  Pt will demonstrate symmetrical lumbar rotation AROM in order to demonstrate improved tolerance to functional movement patterns.  Baseline: see ROM chart above Goal status: INITIAL  3.  Pt will demonstrate grossly symmetrical MMT of at least 4+/5 in tested groups in order to demonstrate improved strength for functional movements.  Baseline: see MMT chart above Goal status: INITIAL  4. Pt will perform 5xSTS in </=14 sec in order to demonstrate reduced fall risk and improved functional independence. (MCID of 2.3sec)  Baseline: 17sec gentle UE support  Goal status: INITIAL   5. Pt will report at least 50% decrease in overall pain levels in past week in order to facilitate improved tolerance to basic ADLs/mobility.   Baseline: 2-9/10  Goal status: INITIAL    6. Pt will improve to at least 464ft w/ less than 3/10 pain on NPS in order to indicate improved functional tolerance.   Baseline: 332ft no AD, 5/10 pain  Goal status: INITIAL  PLAN:  PT FREQUENCY: 1-2x/week  PT DURATION: 8 weeks  PLANNED INTERVENTIONS:  [id number]- PT Re-evaluation, 97750- Physical Performance Testing, 97110-Therapeutic exercises, 97530- Therapeutic activity, 97112- Neuromuscular re-education, 97535- Self Care, [id number]- Manual therapy, [phone]- Gait training, [phone]- Aquatic Therapy, [phone]- Electrical stimulation (unattended), [phone] (1-2 muscles), [phone] (3+ muscles)- Dry Needling, Patient/Family education, Balance training, Stair training, Taping, Joint mobilization, Spinal mobilization, Cryotherapy, and Moist heat.  PLAN FOR NEXT SESSION: Review/update HEP PRN. Work on [employer] exercises as appropriate with emphasis on gentle lumbar mobility, core/LE strength and endurance, introductory walking program. Symptom modification strategies as indicated/appropriate. Interested in working nustep.    Alm DELENA Jenny PT, DPT 08/16/2024 2:48 PM

## 2024-08-17 ENCOUNTER — Other Ambulatory Visit: Payer: Self-pay | Admitting: Family Medicine

## 2024-08-20 ENCOUNTER — Other Ambulatory Visit: Payer: Self-pay | Admitting: Family Medicine

## 2024-08-20 NOTE — Therapy (Signed)
 OUTPATIENT PHYSICAL THERAPY TREATMENT   Patient Name: Regina Houston MRN: [id number] DOB:10-25-1951, 73 y.o., female Today's Date: 08/21/2024  END OF SESSION:  PT End of Session - 08/21/24 1534     Visit Number 8    Number of Visits 17    Date for Recertification  09/13/24    Authorization Type medicare    Progress Note Due on Visit 10    PT Start Time 1533    PT Stop Time 1615    PT Time Calculation (min) 42 min                 Past Medical History:  Diagnosis Date   Allergy 2014   latex rash   Arthritis    Cataract    Diabetes mellitus 11/22/2005   Diverticulitis    Hyperlipidemia    Hypertension    Obesity    Thyroid  disease    Past Surgical History:  Procedure Laterality Date   ABDOMINAL HYSTERECTOMY  2008   fibroids   BIOPSY BREAST Left 2007   benign calcification   BIOPSY THYROID   08/2023   BRAIN SURGERY  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,benign menigioma   Capsulotomy MPJ Right 12/31/2016   RT FOOT, 3rd MPJ   FOOT SURGERY  2004   hammer toe   Hammer Toe Repair Right 12/31/2016   RT #3, #5 toes   Patient Active Problem List   Diagnosis Date Noted   Lymph edema 06/21/2024   Multiple thyroid  nodules 07/19/2023   Pulsatile visual field defect 04/28/2023   Hearing loss of left ear 10/06/2020   Encounter for chronic pain management 01/30/2020   CKD stage G3a/A1, GFR 45-59 and albumin creatinine ratio <30 mg/g (HCC) 10/02/2019   Left foot pain 01/10/2015   Severe obesity (BMI >= 40) (HCC) 08/28/2014   Cataract 08/12/2014   Hypothyroidism 05/28/2014   Lumbar spondylosis 02/26/2014   HLD (hyperlipidemia) 02/01/2014   Essential hypertension 09/20/2011   Primary osteoarthritis of both knees 09/20/2011   Asymptomatic postmenopausal status 04/22/2010   TROCHANTERIC BURSITIS, RIGHT 12/03/2009   Goiter 04/01/2009   Diabetes mellitus (HCC) 04/01/2009    PCP: Alvan Dorothyann BIRCH, MD  REFERRING PROVIDER: Charles Redell LABOR, DO  REFERRING DIAG: [phone]  (ICD-10-CM) - Lumbar spondylosis M54.50,G89.29 (ICD-10-CM) - Chronic bilateral low back pain without sciatica  Rationale for Evaluation and Treatment: Rehabilitation  THERAPY DIAG:  Other low back pain  Muscle weakness (generalized)  ONSET DATE: 10-15 years or more  SUBJECTIVE:  Per eval: Reports chronic back pain 10-15 years or so. Was managing with injections. Has done PT in the past with some success. Feels like it is getting stiffer over the past year or so, no change in activity or MOI. Feels like familiar symptoms just more stiff. Difficulty sleeping, pain/stiffness after heavier activities. Will infrequently get pain referring into legs, but no recent changes with this. Also has BIL knee pain.   SUBJECTIVE STATEMENT: 08/21/2024: felt pretty good Thursday and Friday, but did a lot of sitting Friday afternoon (about 6 hours straight) and felt more pain on Saturday. Sunday felt a bit better, today has been a bit more active and beginning to crank back up again. Wants to work on climbing stairs.     PERTINENT HISTORY:  DM, HTN, thyroid  disease  PAIN:  Are you having pain: 4/10 low back, worst 6/10 in past week (08/21/24)  Per eval:  Location/description: central low back, aching/stiff Best-worst over past week: 2-9/10 - aggravating factors: mowing lawn, sleeping, vacuuming, any household activities involving stooped posture, walking dog  - Easing factors: injections, heating pad    PRECAUTIONS: None  RED FLAGS: None No bowel/bladder symptoms, no N/T, no saddle anesthesia, no buckling, no fevers/chills  WEIGHT BEARING RESTRICTIONS: No  FALLS:  Has patient fallen in last 6 months? No - reports remote fall history, tripping over dog leash  LIVING ENVIRONMENT: Lives alone with her dog,  [place] 1-2 STE, 1 level  No AD use  OCCUPATION: retired - worked in [employer]  PLOF: Independent - enjoys spending time with her dog, playing piano, choir at [employer], being out in yard   PATIENT GOALS: get more limber, get moving more   NEXT MD VISIT: PCP in November per epic review, sports med PRN  OBJECTIVE:  Note: Objective measures were completed at Evaluation unless otherwise noted.  DIAGNOSTIC FINDINGS:  No recent spine imaging in chart - Lumbar MRI 2023, refer to EPIC for details  PATIENT SURVEYS:  ODI: 15/50; 30%  COGNITION: Overall cognitive status: Within functional limits for tasks assessed     SENSATION/NEURO: Light touch intact BIL LE   No clonus either LE Negative hoffmann and tromner sign BIL No ataxia with gait   LUMBAR ROM:   AROM eval  Flexion Distal shin *  Extension 75% initially relieving, then painful  Right lateral flexion   Left lateral flexion   Right rotation 75% s *  Left rotation WNL   (Blank rows = not tested) (Key: WFL = within functional limits not formally assessed, * = concordant pain, s = stiffness/stretching sensation, NT = not tested) Comment:   LOWER EXTREMITY ROM:      Right eval Left eval  Hip flexion    Hip extension    Hip internal rotation    Hip external rotation    Knee extension    Knee flexion    (Blank rows = not tested) (Key: WFL = within functional limits not formally assessed, * = concordant pain, s = stiffness/stretching sensation, NT = not tested)  Comments:    LOWER EXTREMITY MMT:    MMT Right eval Left eval  Hip flexion 4 4  Hip abduction (modified sitting) 4+ 4+  Hip internal rotation 4+ 4  Hip external rotation 4+ 4  Knee flexion 4 4-  Knee extension 4+ 4+  Ankle dorsiflexion 5 5   (Blank rows = not tested) (Key: WFL = within functional limits not formally assessed, * = concordant pain, s = stiffness/stretching sensation,  NT = not tested)  Comments:      FUNCTIONAL TESTS:  5xSTS: 17.35sec  gentle UE support from thighs : 336ft no AD, back pain from 3/10 initially to5/10  08/09/24  - 368ft no AD, no increase in back pain (remains 2/10)  GAIT: Distance walked: within clinic Assistive device utilized: None Level of assistance: Complete Independence Comments: widened BOS, reduced truncal rotation and arm swing    TREATMENT:   [facility] Adult PT Treatment:                                                DATE: 08/21/24  Neuromuscular re-ed: Superset: 4 inch step up x5 BIL, dynadisc step up x5 BIL; 4 rounds  Therapeutic Activity: 1:30 8# farmer carry 2 laps w 1 min rest ambulating after carries, cues for pacing 5# KB pickup x12 from 12 inch step, cues for hinge mechanics, keeping weight close to body Fwd swiss ball rollout x12 for bending tolerance      [facility] Adult PT Treatment:                                                DATE: 08/16/24 Therapeutic Exercise: RB 3 way reach x5 BIL cues for posture and sequencing Blue band row 2x12 cues for form  Swiss ball rollout fwd, x10 cues for trunk mechanics HEP discussion/education  Neuromuscular re-ed: Airex shoulder ext GB 3x8 cues for core activation Dynadisc 3# OH press, seated; 2x10 cues for posture and short lever   Therapeutic Activity: 1:30 8# BIL farmer carry, 1 min rest between (got nearly 4 laps on the second)  5# KB pick up from 12 inch step cues for hinge and form x10  Education/discussion re: walking program progression as indicated    [facility] Adult PT Treatment:                                                DATE: 08/14/24 Therapeutic Exercise: Swiss ball rollout, seated x8 cues for trunk mechanics and comfortable ROM  Red band hip three way reach x5 BIL cues for form and sequencing  HEP update + education/handout  Neuromuscular re-ed: Dynadisc seated 3# chest press 2x10 cues for posture and core activation Dynadisc seated march x12 BIL  Therapeutic Activity: 5# farmer carry ; 8# farmer carry  2x61min cues for pacing, 1 min break between each set  Continued education/discussion re: progression of walking program within tolerance     PATIENT EDUCATION:  Education details: rationale for interventions, HEP  Person educated: Patient Education method: Explanation, Demonstration, Tactile cues, Verbal cues Education comprehension: verbalized understanding, returned demonstration, verbal cues required, tactile cues required, and needs further education     HOME EXERCISE PROGRAM: Access Code: [email or web address] URL: [email or web address] Date: 08/14/2024 Prepared by: Alm Jenny  Exercises - Supine Lower Trunk Rotation  - 2-3 x daily - 1 sets - 8 reps - Seated Hip Adduction Isometrics with Ball  - 2-3 x daily - 1 sets - 8-10 reps - Mini Squat with Counter Support  - 2-3 x daily - 1 sets - 6-8  reps - Small Range Straight Leg Raise  - 2-3 x daily - 1 sets - 5 reps - Standing 3-Way Leg Reach with Resistance at Ankles and Counter Support  - 2-3 x daily - 1 sets - 5 reps - Seated March  - 2-3 x daily - 1 sets - 10 reps  ASSESSMENT:  CLINICAL IMPRESSION: 08/21/2024: Pt arrives w/ report of fluctuating symptoms over weekend, mostly related to increased sitting on Friday. Increasing volume w/ carrying/activity tolerance tasks which she tolerates quite well, minimal fatigue and no increase in pain noted. Pt also notes wanted to work on stairs as they will aggravate her symptoms - introducing superset as above to work on motor control and improved closed chain stability. Some fatigue/irritation that improves w/ swiss ball rollout at end of session. No adverse events, tolerates session well without increase in pain on departure. Recommend continuing along current POC in order to address relevant deficits and improve functional tolerance. Pt departs today's session in no acute distress, all voiced questions/concerns addressed appropriately from PT perspective.     Per eval: Patient is a very  pleasant 73 y.o. woman who was seen today for physical therapy evaluation and treatment for chronic back pain. Endorses difficulty w/ repetitive tasks, household tasks, and prolonged ambulation. No red flags on exam today. Demonstrates concordant limitations with lumbar mobility and LE strength (particularly L hip). Fall risk and reduced functional mobility as indicated by 5xSTS time. Tolerates exam/HEP well overall, no adverse events. Notes relief w/ LTR and adduction iso. Recommend trial of skilled PT to address aforementioned deficits with aim of improving functional tolerance and reducing pain with typical activities. Pt departs today's session in no acute distress, all voiced concerns/questions addressed appropriately from PT perspective.    OBJECTIVE IMPAIRMENTS: Abnormal gait, decreased activity tolerance, decreased endurance, decreased mobility, difficulty walking, decreased ROM, decreased strength, impaired perceived functional ability, improper body mechanics, postural dysfunction, and pain.   ACTIVITY LIMITATIONS: carrying, lifting, bending, sitting, standing, squatting, transfers, and locomotion level  PARTICIPATION LIMITATIONS: meal prep, cleaning, laundry, and community activity  PERSONAL FACTORS: Age, Time since onset of injury/illness/exacerbation, and 3+ comorbidities: DM, HTN, thyroid  disease, CKD3 are also affecting patient's functional outcome.   REHAB POTENTIAL: Good  CLINICAL DECISION MAKING: Stable/uncomplicated  EVALUATION COMPLEXITY: Low   GOALS:  SHORT TERM GOALS: Target date: 08/16/2024  Pt will demonstrate appropriate understanding and performance of initially prescribed HEP in order to facilitate improved independence with management of symptoms.  Baseline: HEP established  08/14/24: reports good HEP performance Goal status: MET  2. Pt will report at least 25% improvement in overall pain levels over past week in order to facilitate improved tolerance to typical  daily activities.   Baseline: 2-9/10 08/14/24: 0-4/10 Goal status: MET  LONG TERM GOALS: Target date: 09/13/2024   Pt will improve at least 15% on ODI in order to demonstrate improved perception of functional status due to symptoms.  Baseline: 30% Goal status: INITIAL  2.  Pt will demonstrate symmetrical lumbar rotation AROM in order to demonstrate improved tolerance to functional movement patterns.  Baseline: see ROM chart above Goal status: INITIAL  3.  Pt will demonstrate grossly symmetrical MMT of at least 4+/5 in tested groups in order to demonstrate improved strength for functional movements.  Baseline: see MMT chart above Goal status: INITIAL  4. Pt will perform 5xSTS in </=14 sec in order to demonstrate reduced fall risk and improved functional independence. (MCID of 2.3sec)  Baseline: 17sec gentle UE support  Goal status: INITIAL   5. Pt will report at least 50% decrease in overall pain levels in past week in order to facilitate improved tolerance to basic ADLs/mobility.   Baseline: 2-9/10  Goal status: INITIAL    6. Pt will improve to at least 455ft w/ less than 3/10 pain on NPS in order to indicate improved functional tolerance.   Baseline: 340ft no AD, 5/10 pain  Goal status: INITIAL  PLAN:  PT FREQUENCY: 1-2x/week  PT DURATION: 8 weeks  PLANNED INTERVENTIONS: 97164- PT Re-evaluation, 97750- Physical Performance Testing, 97110-Therapeutic exercises, 97530- Therapeutic activity, 97112- Neuromuscular re-education, 97535- Self Care, [id number]- Manual therapy, [phone]- Gait training, [phone]- Aquatic Therapy, [phone]- Electrical stimulation (unattended), [phone] (1-2 muscles), [id number] (3+ muscles)- Dry Needling, Patient/Family education, Balance training, Stair training, Taping, Joint mobilization, Spinal mobilization, Cryotherapy, and Moist heat.  PLAN FOR NEXT SESSION: Review/update HEP PRN. Work on [employer] exercises as appropriate with emphasis on gentle lumbar mobility,  core/LE strength and endurance, introductory walking program. Symptom modification strategies as indicated/appropriate. Interested in working nustep.    Alm DELENA Jenny PT, DPT 08/21/2024 4:19 PM

## 2024-08-21 ENCOUNTER — Ambulatory Visit: Admitting: Physical Therapy

## 2024-08-21 ENCOUNTER — Encounter: Payer: Self-pay | Admitting: Physical Therapy

## 2024-08-21 DIAGNOSIS — M5459 Other low back pain: Secondary | ICD-10-CM | POA: Diagnosis not present

## 2024-08-21 DIAGNOSIS — M6281 Muscle weakness (generalized): Secondary | ICD-10-CM

## 2024-08-22 NOTE — Therapy (Signed)
 OUTPATIENT PHYSICAL THERAPY TREATMENT   Patient Name: Regina Houston MRN: [id number] DOB:September 28, 1951, 73 y.o., female Today's Date: 08/23/2024  END OF SESSION:  PT End of Session - 08/23/24 1539     Visit Number 9    Number of Visits 17    Date for Recertification  09/13/24    Authorization Type medicare    Progress Note Due on Visit 10    PT Start Time 1539    PT Stop Time 1621    PT Time Calculation (min) 42 min                  Past Medical History:  Diagnosis Date   Allergy 2014   latex rash   Arthritis    Cataract    Diabetes mellitus 11/22/2005   Diverticulitis    Hyperlipidemia    Hypertension    Obesity    Thyroid  disease    Past Surgical History:  Procedure Laterality Date   ABDOMINAL HYSTERECTOMY  2008   fibroids   BIOPSY BREAST Left 2007   benign calcification   BIOPSY THYROID   08/2023   BRAIN SURGERY  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,benign menigioma   Capsulotomy MPJ Right 12/31/2016   RT FOOT, 3rd MPJ   FOOT SURGERY  2004   hammer toe   Hammer Toe Repair Right 12/31/2016   RT #3, #5 toes   Patient Active Problem List   Diagnosis Date Noted   Lymph edema 06/21/2024   Multiple thyroid  nodules 07/19/2023   Pulsatile visual field defect 04/28/2023   Hearing loss of left ear 10/06/2020   Encounter for chronic pain management 01/30/2020   CKD stage G3a/A1, GFR 45-59 and albumin creatinine ratio <30 mg/g (HCC) 10/02/2019   Left foot pain 01/10/2015   Severe obesity (BMI >= 40) (HCC) 08/28/2014   Cataract 08/12/2014   Hypothyroidism 05/28/2014   Lumbar spondylosis 02/26/2014   HLD (hyperlipidemia) 02/01/2014   Essential hypertension 09/20/2011   Primary osteoarthritis of both knees 09/20/2011   Asymptomatic postmenopausal status 04/22/2010   TROCHANTERIC BURSITIS, RIGHT 12/03/2009   Goiter 04/01/2009   Diabetes mellitus (HCC) 04/01/2009    PCP: Alvan Dorothyann BIRCH, MD  REFERRING PROVIDER: Charles Redell LABOR, DO  REFERRING DIAG: [phone]  (ICD-10-CM) - Lumbar spondylosis M54.50,G89.29 (ICD-10-CM) - Chronic bilateral low back pain without sciatica  Rationale for Evaluation and Treatment: Rehabilitation  THERAPY DIAG:  Other low back pain  Muscle weakness (generalized)  ONSET DATE: 10-15 years or more  SUBJECTIVE:  Per eval: Reports chronic back pain 10-15 years or so. Was managing with injections. Has done PT in the past with some success. Feels like it is getting stiffer over the past year or so, no change in activity or MOI. Feels like familiar symptoms just more stiff. Difficulty sleeping, pain/stiffness after heavier activities. Will infrequently get pain referring into legs, but no recent changes with this. Also has BIL knee pain.   SUBJECTIVE STATEMENT: 08/23/2024: feeling a bit achier with the weather changes. Also notes having to hold her dog on the leash Tuesday evening and that may have flared things up a bit. Felt okay after last session. No other new updates.     PERTINENT HISTORY:  DM, HTN, thyroid  disease  PAIN:  Are you having pain: 4/10 low back, worst 6/10 in past week (08/21/24)  Per eval:  Location/description: central low back, aching/stiff Best-worst over past week: 2-9/10 - aggravating factors: mowing lawn, sleeping, vacuuming, any household activities involving stooped posture, walking dog  - Easing factors: injections, heating pad    PRECAUTIONS: None  RED FLAGS: None No bowel/bladder symptoms, no N/T, no saddle anesthesia, no buckling, no fevers/chills  WEIGHT BEARING RESTRICTIONS: No  FALLS:  Has patient fallen in last 6 months? No - reports remote fall history, tripping over dog leash  LIVING ENVIRONMENT: Lives alone with her dog, [place] 1-2 STE, 1 level  No AD use  OCCUPATION: retired - worked in  [employer]  PLOF: Independent - enjoys spending time with her dog, playing piano, choir at [employer], being out in yard   PATIENT GOALS: get more limber, get moving more   NEXT MD VISIT: PCP in November per epic review, sports med PRN  OBJECTIVE:  Note: Objective measures were completed at Evaluation unless otherwise noted.  DIAGNOSTIC FINDINGS:  No recent spine imaging in chart - Lumbar MRI 2023, refer to EPIC for details  PATIENT SURVEYS:  ODI: 15/50; 30%  COGNITION: Overall cognitive status: Within functional limits for tasks assessed     SENSATION/NEURO: Light touch intact BIL LE   No clonus either LE Negative hoffmann and tromner sign BIL No ataxia with gait   LUMBAR ROM:   AROM eval  Flexion Distal shin *  Extension 75% initially relieving, then painful  Right lateral flexion   Left lateral flexion   Right rotation 75% s *  Left rotation WNL   (Blank rows = not tested) (Key: WFL = within functional limits not formally assessed, * = concordant pain, s = stiffness/stretching sensation, NT = not tested) Comment:   LOWER EXTREMITY ROM:      Right eval Left eval  Hip flexion    Hip extension    Hip internal rotation    Hip external rotation    Knee extension    Knee flexion    (Blank rows = not tested) (Key: WFL = within functional limits not formally assessed, * = concordant pain, s = stiffness/stretching sensation, NT = not tested)  Comments:    LOWER EXTREMITY MMT:    MMT Right eval Left eval  Hip flexion 4 4  Hip abduction (modified sitting) 4+ 4+  Hip internal rotation 4+ 4  Hip external rotation 4+ 4  Knee flexion 4 4-  Knee extension 4+ 4+  Ankle dorsiflexion 5 5   (Blank rows = not tested) (Key: WFL = within functional limits not formally assessed, * = concordant pain, s = stiffness/stretching sensation, NT = not tested)  Comments:  FUNCTIONAL TESTS:  5xSTS: 17.35sec gentle UE support from thighs : 328ft no AD, back pain from 3/10  initially to5/10  08/09/24  - 322ft no AD, no increase in back pain (remains 2/10)  GAIT: Distance walked: within clinic Assistive device utilized: None Level of assistance: Complete Independence Comments: widened BOS, reduced truncal rotation and arm swing    TREATMENT:   [facility] Adult PT Treatment:                                                DATE: 08/23/24 Therapeutic Exercise: Seated swiss ball three way rollout 2x8 cues for comfortable ROM  Seated hamstring stretch 3x30sec BIL cues for setup, w/ strap  Short lever open book x8 BIL cues for positioning and comfortable ROM  HEP update + education/discussion  Neuromuscular re-ed: Blue band paloff 2x8  Blue band kickstand row 2x8  Strong band 5# KB suitcase carry 2 laps BIL     [facility] Adult PT Treatment:                                                DATE: 08/21/24  Neuromuscular re-ed: Superset: 4 inch step up x5 BIL, dynadisc step up x5 BIL; 4 rounds  Therapeutic Activity: 1:30 8# farmer carry 2 laps w 1 min rest ambulating after carries, cues for pacing 5# KB pickup x12 from 12 inch step, cues for hinge mechanics, keeping weight close to body Fwd swiss ball rollout x12 for bending tolerance      [facility] Adult PT Treatment:                                                DATE: 08/16/24 Therapeutic Exercise: RB 3 way reach x5 BIL cues for posture and sequencing Blue band row 2x12 cues for form  Swiss ball rollout fwd, x10 cues for trunk mechanics HEP discussion/education  Neuromuscular re-ed: Airex shoulder ext GB 3x8 cues for core activation Dynadisc 3# OH press, seated; 2x10 cues for posture and short lever   Therapeutic Activity: 1:30 8# BIL farmer carry, 1 min rest between (got nearly 4 laps on the second)  5# KB pick up from 12 inch step cues for hinge and form x10  Education/discussion re: walking program progression as indicated    PATIENT EDUCATION:  Education details: rationale for interventions,  HEP  Person educated: Patient Education method: Explanation, Demonstration, Tactile cues, Verbal cues Education comprehension: verbalized understanding, returned demonstration, verbal cues required, tactile cues required, and needs further education     HOME EXERCISE PROGRAM: Access Code: [email or web address] URL: [email or web address] Date: 08/23/2024 Prepared by: Alm Jenny  Program Notes - with single arm row, please do split stance as done in clinic, please do not do single leg  Exercises - Supine Lower Trunk Rotation  - 2-3 x daily - 1 sets - 8 reps - Small Range Straight Leg Raise  - 2-3 x daily - 1 sets - 5 reps - Standing 3-Way Leg Reach with Resistance at Ankles and Counter Support  - 2-3 x daily - 1 sets - 5 reps -  Standing Anti-Rotation Press with Anchored Resistance  - 2-3 x daily - 1 sets - 8-10 reps - Single Arm Row on One Leg with Resistance  - 2-3 x daily - 1 sets - 8 reps  ASSESSMENT:  CLINICAL IMPRESSION: 08/23/2024: pt arrives w/ report of increased irritability, 5/10 which she attributes to weather and pulling dog on leash, no issues after last session. Today focusing initially on mobility work for symptom modification which she does well with, reporting relief particularly w/ swiss ball rollout. Following with core stability work which she tolerates well. No adverse events, reports improved symptoms (~4/10) on departure. Recommend continuing along current POC in order to address relevant deficits and improve functional tolerance. Pt departs today's session in no acute distress, all voiced questions/concerns addressed appropriately from PT perspective.      Per eval: Patient is a very pleasant 73 y.o. woman who was seen today for physical therapy evaluation and treatment for chronic back pain. Endorses difficulty w/ repetitive tasks, household tasks, and prolonged ambulation. No red flags on exam today. Demonstrates concordant limitations with lumbar mobility and  LE strength (particularly L hip). Fall risk and reduced functional mobility as indicated by 5xSTS time. Tolerates exam/HEP well overall, no adverse events. Notes relief w/ LTR and adduction iso. Recommend trial of skilled PT to address aforementioned deficits with aim of improving functional tolerance and reducing pain with typical activities. Pt departs today's session in no acute distress, all voiced concerns/questions addressed appropriately from PT perspective.    OBJECTIVE IMPAIRMENTS: Abnormal gait, decreased activity tolerance, decreased endurance, decreased mobility, difficulty walking, decreased ROM, decreased strength, impaired perceived functional ability, improper body mechanics, postural dysfunction, and pain.   ACTIVITY LIMITATIONS: carrying, lifting, bending, sitting, standing, squatting, transfers, and locomotion level  PARTICIPATION LIMITATIONS: meal prep, cleaning, laundry, and community activity  PERSONAL FACTORS: Age, Time since onset of injury/illness/exacerbation, and 3+ comorbidities: DM, HTN, thyroid  disease, CKD3 are also affecting patient's functional outcome.   REHAB POTENTIAL: Good  CLINICAL DECISION MAKING: Stable/uncomplicated  EVALUATION COMPLEXITY: Low   GOALS:  SHORT TERM GOALS: Target date: 08/16/2024  Pt will demonstrate appropriate understanding and performance of initially prescribed HEP in order to facilitate improved independence with management of symptoms.  Baseline: HEP established  08/14/24: reports good HEP performance Goal status: MET  2. Pt will report at least 25% improvement in overall pain levels over past week in order to facilitate improved tolerance to typical daily activities.   Baseline: 2-9/10 08/14/24: 0-4/10 Goal status: MET  LONG TERM GOALS: Target date: 09/13/2024   Pt will improve at least 15% on ODI in order to demonstrate improved perception of functional status due to symptoms.  Baseline: 30% Goal status: INITIAL  2.  Pt  will demonstrate symmetrical lumbar rotation AROM in order to demonstrate improved tolerance to functional movement patterns.  Baseline: see ROM chart above Goal status: INITIAL  3.  Pt will demonstrate grossly symmetrical MMT of at least 4+/5 in tested groups in order to demonstrate improved strength for functional movements.  Baseline: see MMT chart above Goal status: INITIAL  4. Pt will perform 5xSTS in </=14 sec in order to demonstrate reduced fall risk and improved functional independence. (MCID of 2.3sec)  Baseline: 17sec gentle UE support  Goal status: INITIAL   5. Pt will report at least 50% decrease in overall pain levels in past week in order to facilitate improved tolerance to basic ADLs/mobility.   Baseline: 2-9/10  Goal status: INITIAL    6. Pt will  improve to at least 444ft w/ less than 3/10 pain on NPS in order to indicate improved functional tolerance.   Baseline: 361ft no AD, 5/10 pain  Goal status: INITIAL  PLAN:  PT FREQUENCY: 1-2x/week  PT DURATION: 8 weeks  PLANNED INTERVENTIONS: [id number]- PT Re-evaluation, 97750- Physical Performance Testing, 97110-Therapeutic exercises, 97530- Therapeutic activity, 97112- Neuromuscular re-education, 97535- Self Care, [id number]- Manual therapy, [phone]- Gait training, [phone]- Aquatic Therapy, [phone]- Electrical stimulation (unattended), [phone] (1-2 muscles), [phone] (3+ muscles)- Dry Needling, Patient/Family education, Balance training, Stair training, Taping, Joint mobilization, Spinal mobilization, Cryotherapy, and Moist heat.  PLAN FOR NEXT SESSION: Review/update HEP PRN. Work on [employer] exercises as appropriate with emphasis on gentle lumbar mobility, core/LE strength and endurance, introductory walking program. Symptom modification strategies as indicated/appropriate. Interested in working nustep. Progress note next visit.    Alm DELENA Jenny PT, DPT 08/23/2024 4:32 PM

## 2024-08-23 ENCOUNTER — Ambulatory Visit: Admitting: Physical Therapy

## 2024-08-23 ENCOUNTER — Encounter: Payer: Self-pay | Admitting: Physical Therapy

## 2024-08-23 DIAGNOSIS — M5459 Other low back pain: Secondary | ICD-10-CM | POA: Insufficient documentation

## 2024-08-23 DIAGNOSIS — M6281 Muscle weakness (generalized): Secondary | ICD-10-CM | POA: Diagnosis not present

## 2024-08-27 NOTE — Therapy (Signed)
 OUTPATIENT PHYSICAL THERAPY TREATMENT + PROGRESS NOTE   Patient Name: Regina Houston MRN: [id number] DOB:November 02, 1951, 73 y.o., female Today's Date: 08/28/2024  Progress Note Reporting Period 07/19/24 to 08/28/24  See note below for Objective Data and Assessment of Progress/Goals.      END OF SESSION:  PT End of Session - 08/28/24 1313     Visit Number 10    Number of Visits 17    Date for Recertification  09/13/24    Authorization Type medicare    Progress Note Due on Visit 10    PT Start Time 1315    PT Stop Time 1358    PT Time Calculation (min) 43 min             Past Medical History:  Diagnosis Date   Allergy 2014   latex rash   Arthritis    Cataract    Diabetes mellitus 11/22/2005   Diverticulitis    Hyperlipidemia    Hypertension    Obesity    Thyroid  disease    Past Surgical History:  Procedure Laterality Date   ABDOMINAL HYSTERECTOMY  2008   fibroids   BIOPSY BREAST Left 2007   benign calcification   BIOPSY THYROID   08/2023   BRAIN SURGERY  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,benign menigioma   Capsulotomy MPJ Right 12/31/2016   RT FOOT, 3rd MPJ   FOOT SURGERY  2004   hammer toe   Hammer Toe Repair Right 12/31/2016   RT #3, #5 toes   Patient Active Problem List   Diagnosis Date Noted   Lymph edema 06/21/2024   Multiple thyroid  nodules 07/19/2023   Pulsatile visual field defect 04/28/2023   Hearing loss of left ear 10/06/2020   Encounter for chronic pain management 01/30/2020   CKD stage G3a/A1, GFR 45-59 and albumin creatinine ratio <30 mg/g (HCC) 10/02/2019   Left foot pain 01/10/2015   Severe obesity (BMI >= 40) (HCC) 08/28/2014   Cataract 08/12/2014   Hypothyroidism 05/28/2014   Lumbar spondylosis 02/26/2014   HLD (hyperlipidemia) 02/01/2014   Essential hypertension 09/20/2011   Primary osteoarthritis of both knees 09/20/2011   Asymptomatic postmenopausal status 04/22/2010   TROCHANTERIC BURSITIS, RIGHT 12/03/2009   Goiter 04/01/2009   Diabetes  mellitus (HCC) 04/01/2009    PCP: Alvan Dorothyann BIRCH, MD  REFERRING PROVIDER: Charles Redell LABOR, DO  REFERRING DIAG: [phone] (ICD-10-CM) - Lumbar spondylosis M54.50,G89.29 (ICD-10-CM) - Chronic bilateral low back pain without sciatica  Rationale for Evaluation and Treatment: Rehabilitation  THERAPY DIAG:  Other low back pain  Muscle weakness (generalized)  ONSET DATE: 10-15 years or more  SUBJECTIVE:  Per eval: Reports chronic back pain 10-15 years or so. Was managing with injections. Has done PT in the past with some success. Feels like it is getting stiffer over the past year or so, no change in activity or MOI. Feels like familiar symptoms just more stiff. Difficulty sleeping, pain/stiffness after heavier activities. Will infrequently get pain referring into legs, but no recent changes with this. Also has BIL knee pain.   SUBJECTIVE STATEMENT: 08/28/2024: states she had a fall over the weekend - was pulling weeds in garden and fell, was able to get back up and ambulate independently. Did have a little bit of knee/back pain afterwards but has improved since onset, did not hit head, no new symptoms or red flags. States since starting PT she feels less stiffness, walking getting a lot better. Still having difficulty with standing for prolonged periods     PERTINENT HISTORY:  DM, HTN, thyroid  disease  PAIN:  Are you having pain: 3/10 low back, worst 4/10 in past week (08/28/24)    Per eval:  Location/description: central low back, aching/stiff Best-worst over past week: 2-9/10 - aggravating factors: mowing lawn, sleeping, vacuuming, any household activities involving stooped posture, walking dog  - Easing factors: injections, heating pad    PRECAUTIONS: None  RED FLAGS: None No bowel/bladder  symptoms, no N/T, no saddle anesthesia, no buckling, no fevers/chills  WEIGHT BEARING RESTRICTIONS: No  FALLS:  Has patient fallen in last 6 months? No - reports remote fall history, tripping over dog leash  LIVING ENVIRONMENT: Lives alone with her dog, [place] 1-2 STE, 1 level  No AD use  OCCUPATION: retired - worked in [employer] pilot  PLOF: Independent - enjoys spending time with her dog, playing piano, choir at [employer], being out in yard   PATIENT GOALS: get more limber, get moving more   NEXT MD VISIT: PCP in November per epic review, sports med PRN  OBJECTIVE:  Note: Objective measures were completed at Evaluation unless otherwise noted.  DIAGNOSTIC FINDINGS:  No recent spine imaging in chart - Lumbar MRI 2023, refer to EPIC for details  PATIENT SURVEYS:  ODI: 15/50; 30%  08/28/24 ODI: 9/50; 18%   COGNITION: Overall cognitive status: Within functional limits for tasks assessed     SENSATION/NEURO: Light touch intact BIL LE   No clonus either LE Negative hoffmann and tromner sign BIL No ataxia with gait   LUMBAR ROM:   AROM eval 08/28/24  Flexion Distal shin * Ankles *  Extension 75% initially relieving, then painful 100%  Right lateral flexion    Left lateral flexion    Right rotation 75% s * WNL *  Left rotation WNL WNL    (Blank rows = not tested) (Key: WFL = within functional limits not formally assessed, * = concordant pain, s = stiffness/stretching sensation, NT = not tested) Comment:   LOWER EXTREMITY ROM:      Right eval Left eval  Hip flexion    Hip extension    Hip internal rotation    Hip external rotation    Knee extension    Knee flexion    (Blank rows = not tested) (Key: WFL = within functional limits not formally assessed, * = concordant pain, s = stiffness/stretching sensation, NT = not tested)  Comments:    LOWER EXTREMITY MMT:    MMT Right eval Left eval R/L 08/28/24    Hip flexion 4 4 4/4  Hip abduction (modified sitting) 4+ 4+ 4+/4+   Hip internal rotation 4+  4 4+/4+  Hip external rotation 4+ 4 4+/4+  Knee flexion 4 4- 4+/4-  Knee extension 4+ 4+ 4+/4+  Ankle dorsiflexion 5 5    (Blank rows = not tested) (Key: WFL = within functional limits not formally assessed, * = concordant pain, s = stiffness/stretching sensation, NT = not tested)  Comments:      FUNCTIONAL TESTS:  5xSTS: 17.35sec gentle UE support from thighs : 373ft no AD, back pain from 3/10 initially to5/10  08/09/24  - 330ft no AD, no increase in back pain (remains 2/10)  08/28/24: - 5xSTS 12.75sec gentle UE support from thighs - 361ft no AD, 3/10 pain, muscular fatigue    GAIT: Distance walked: within clinic Assistive device utilized: None Level of assistance: Complete Independence Comments: widened BOS, reduced truncal rotation and arm swing    TREATMENT:   [facility] Adult PT Treatment:                                                DATE: 08/28/24 Therapeutic Exercise: 3 way swiss ball rollout x30sec BIL Swiss ball core press down x12 HEP discussion/education  Therapeutic Activity: Nu step 5 min L3 LE/UE for activity tolerance MSK assessment + education 5xSTS + education + education Education/discussion re: progress with PT, symptom behavior as it affects activity tolerance, PT goals/POC     [facility] Adult PT Treatment:                                                DATE: 08/23/24 Therapeutic Exercise: Seated swiss ball three way rollout 2x8 cues for comfortable ROM  Seated hamstring stretch 3x30sec BIL cues for setup, w/ strap  Short lever open book x8 BIL cues for positioning and comfortable ROM  HEP update + education/discussion  Neuromuscular re-ed: Blue band paloff 2x8  Blue band kickstand row 2x8  Strong band 5# KB suitcase carry 2 laps BIL     [facility] Adult PT Treatment:                                                DATE: 08/21/24  Neuromuscular re-ed: Superset: 4 inch step up x5 BIL, dynadisc step up x5 BIL;  4 rounds  Therapeutic Activity: 1:30 8# farmer carry 2 laps w 1 min rest ambulating after carries, cues for pacing 5# KB pickup x12 from 12 inch step, cues for hinge mechanics, keeping weight close to body Fwd swiss ball rollout x12 for bending tolerance      PATIENT EDUCATION:  Education details: rationale for interventions, HEP  Person educated: Patient Education method: Explanation, Demonstration, Tactile cues, Verbal cues Education comprehension: verbalized understanding, returned demonstration, verbal cues required, tactile cues required, and needs further education     HOME EXERCISE PROGRAM: Access Code: [email or web address] URL: [email or web address] Date: 08/23/2024 Prepared by: Alm Jenny  Program Notes - with single arm row, please do split stance as done in clinic, please do not do single leg  Exercises - Supine Lower Trunk Rotation  - 2-3 x daily - 1 sets - 8 reps - Small Range Straight  Leg Raise  - 2-3 x daily - 1 sets - 5 reps - Standing 3-Way Leg Reach with Resistance at Ankles and Counter Support  - 2-3 x daily - 1 sets - 5 reps - Standing Anti-Rotation Press with Anchored Resistance  - 2-3 x daily - 1 sets - 8-10 reps - Single Arm Row on One Leg with Resistance  - 2-3 x daily - 1 sets - 8 reps  ASSESSMENT:  CLINICAL IMPRESSION: 08/28/2024: Pt arrives w/ report of symptom flare after a fall Friday - no red flags, symptoms improved. Overall she reports good progress since starting PT in terms of stiffness, overall pain levels, and walking tolerance. She also demonstrates improvements in 5xSTS, , and MMT. Deficits persist with focal MMT and lumbar mobility, and has not quite met goal. At this point recommend continuing along current POC in order to address remaining deficits with aim of maximizing functional tolerance and minimizing fall risk. Pt departs today's session in no acute distress, all voiced questions/concerns addressed appropriately from  PT perspective.      Per eval: Patient is a very pleasant 73 y.o. woman who was seen today for physical therapy evaluation and treatment for chronic back pain. Endorses difficulty w/ repetitive tasks, household tasks, and prolonged ambulation. No red flags on exam today. Demonstrates concordant limitations with lumbar mobility and LE strength (particularly L hip). Fall risk and reduced functional mobility as indicated by 5xSTS time. Tolerates exam/HEP well overall, no adverse events. Notes relief w/ LTR and adduction iso. Recommend trial of skilled PT to address aforementioned deficits with aim of improving functional tolerance and reducing pain with typical activities. Pt departs today's session in no acute distress, all voiced concerns/questions addressed appropriately from PT perspective.    OBJECTIVE IMPAIRMENTS: Abnormal gait, decreased activity tolerance, decreased endurance, decreased mobility, difficulty walking, decreased ROM, decreased strength, impaired perceived functional ability, improper body mechanics, postural dysfunction, and pain.   ACTIVITY LIMITATIONS: carrying, lifting, bending, sitting, standing, squatting, transfers, and locomotion level  PARTICIPATION LIMITATIONS: meal prep, cleaning, laundry, and community activity  PERSONAL FACTORS: Age, Time since onset of injury/illness/exacerbation, and 3+ comorbidities: DM, HTN, thyroid  disease, CKD3 are also affecting patient's functional outcome.   REHAB POTENTIAL: Good  CLINICAL DECISION MAKING: Stable/uncomplicated  EVALUATION COMPLEXITY: Low   GOALS:  SHORT TERM GOALS: Target date: 08/16/2024  Pt will demonstrate appropriate understanding and performance of initially prescribed HEP in order to facilitate improved independence with management of symptoms.  Baseline: HEP established  08/14/24: reports good HEP performance Goal status: MET  2. Pt will report at least 25% improvement in overall pain levels over past week in  order to facilitate improved tolerance to typical daily activities.   Baseline: 2-9/10 08/14/24: 0-4/10 Goal status: MET  LONG TERM GOALS: Target date: 09/13/2024   Pt will improve at least 15% on ODI in order to demonstrate improved perception of functional status due to symptoms.  Baseline: 30% 08/28/24: 9/50; 18%  Goal status: PROGRESSING  2.  Pt will demonstrate symmetrical lumbar rotation AROM in order to demonstrate improved tolerance to functional movement patterns.  Baseline: see ROM chart above 08/28/24: symmetrical, painful  Goal status: PARTIALLY MET  3.  Pt will demonstrate grossly symmetrical MMT of at least 4+/5 in tested groups in order to demonstrate improved strength for functional movements.  Baseline: see MMT chart above 08/28/24: see MMT chart above  Goal status: PROGRESSING  4. Pt will perform 5xSTS in </=14 sec in order to demonstrate reduced fall  risk and improved functional independence. (MCID of 2.3sec)  Baseline: 17sec gentle UE support  08/28/24: 12.75sec gentle UE support  Goal status: MET  5. Pt will report at least 50% decrease in overall pain levels in past week in order to facilitate improved tolerance to basic ADLs/mobility.   Baseline: 2-9/10  08/28/24: 2-4/10  Goal status: PARTIALLY MET    6. Pt will improve to at least 455ft w/ less than 3/10 pain on NPS in order to indicate improved functional tolerance.   Baseline: 328ft no AD, 5/10 pain  08/28/24: 360ft no AD   Goal status: PROGRESSING  PLAN:  PT FREQUENCY: 1-2x/week  PT DURATION: 8 weeks  PLANNED INTERVENTIONS: 97164- PT Re-evaluation, 97750- Physical Performance Testing, 97110-Therapeutic exercises, 97530- Therapeutic activity, 97112- Neuromuscular re-education, 97535- Self Care, [id number]- Manual therapy, [phone]- Gait training, [phone]- Aquatic Therapy, [phone]- Electrical stimulation (unattended), [phone] (1-2 muscles), [phone] (3+ muscles)- Dry Needling, Patient/Family education, Balance  training, Stair training, Taping, Joint mobilization, Spinal mobilization, Cryotherapy, and Moist heat.  PLAN FOR NEXT SESSION: Review/update HEP PRN. Work on [employer] exercises as appropriate with emphasis on gentle lumbar mobility, core/LE strength and endurance, introductory walking program. Symptom modification strategies as indicated/appropriate.   Alm DELENA Jenny PT, DPT 08/28/2024 2:47 PM

## 2024-08-28 ENCOUNTER — Encounter: Payer: Self-pay | Admitting: Physical Therapy

## 2024-08-28 ENCOUNTER — Ambulatory Visit: Admitting: Physical Therapy

## 2024-08-28 DIAGNOSIS — M6281 Muscle weakness (generalized): Secondary | ICD-10-CM | POA: Diagnosis not present

## 2024-08-28 DIAGNOSIS — M5459 Other low back pain: Secondary | ICD-10-CM | POA: Diagnosis not present

## 2024-08-29 NOTE — Therapy (Unsigned)
 OUTPATIENT PHYSICAL THERAPY TREATMENT    Patient Name: Regina Houston MRN: [id number] DOB:1951/11/06, 73 y.o., female Today's Date: 08/30/2024     END OF SESSION:  PT End of Session - 08/30/24 1356     Visit Number 11    Number of Visits 17    Date for Recertification  09/13/24    Authorization Type medicare    Progress Note Due on Visit 10    PT Start Time 1400    PT Stop Time 1443    PT Time Calculation (min) 43 min    Activity Tolerance Patient tolerated treatment well    Behavior During Therapy [facility] for tasks assessed/performed              Past Medical History:  Diagnosis Date   Allergy 2014   latex rash   Arthritis    Cataract    Diabetes mellitus 11/22/2005   Diverticulitis    Hyperlipidemia    Hypertension    Obesity    Thyroid  disease    Past Surgical History:  Procedure Laterality Date   ABDOMINAL HYSTERECTOMY  2008   fibroids   BIOPSY BREAST Left 2007   benign calcification   BIOPSY THYROID   08/2023   BRAIN SURGERY  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,benign menigioma   Capsulotomy MPJ Right 12/31/2016   RT FOOT, 3rd MPJ   FOOT SURGERY  2004   hammer toe   Hammer Toe Repair Right 12/31/2016   RT #3, #5 toes   Patient Active Problem List   Diagnosis Date Noted   Lymph edema 06/21/2024   Multiple thyroid  nodules 07/19/2023   Pulsatile visual field defect 04/28/2023   Hearing loss of left ear 10/06/2020   Encounter for chronic pain management 01/30/2020   CKD stage G3a/A1, GFR 45-59 and albumin creatinine ratio <30 mg/g (HCC) 10/02/2019   Left foot pain 01/10/2015   Severe obesity (BMI >= 40) (HCC) 08/28/2014   Cataract 08/12/2014   Hypothyroidism 05/28/2014   Lumbar spondylosis 02/26/2014   HLD (hyperlipidemia) 02/01/2014   Essential hypertension 09/20/2011   Primary osteoarthritis of both knees 09/20/2011   Asymptomatic postmenopausal status 04/22/2010   TROCHANTERIC BURSITIS, RIGHT 12/03/2009   Goiter 04/01/2009   Diabetes mellitus (HCC) 04/01/2009     PCP: Alvan Dorothyann BIRCH, MD  REFERRING PROVIDER: Charles Redell LABOR, DO  REFERRING DIAG: [phone] (ICD-10-CM) - Lumbar spondylosis M54.50,G89.29 (ICD-10-CM) - Chronic bilateral low back pain without sciatica  Rationale for Evaluation and Treatment: Rehabilitation  THERAPY DIAG:  No diagnosis found.  ONSET DATE: 10-15 years or more  SUBJECTIVE:  Per eval: Reports chronic back pain 10-15 years or so. Was managing with injections. Has done PT in the past with some success. Feels like it is getting stiffer over the past year or so, no change in activity or MOI. Feels like familiar symptoms just more stiff. Difficulty sleeping, pain/stiffness after heavier activities. Will infrequently get pain referring into legs, but no recent changes with this. Also has BIL knee pain.   SUBJECTIVE STATEMENT: Pt reports worse pain on the right side of hip. She [place] the [place], walking for about 4 hours. She reports she is doing her HEP.      PERTINENT HISTORY:  DM, HTN, thyroid  disease  PAIN:  Are you having pain: 5/10 low back, worst 4/10 in past week (08/28/24)    Per eval:  Location/description: central low back, aching/stiff Best-worst over past week: 2-9/10 - aggravating factors: mowing lawn, sleeping, vacuuming, any household activities involving stooped posture, walking dog  - Easing factors: injections, heating pad    PRECAUTIONS: None  RED FLAGS: None No bowel/bladder symptoms, no N/T, no saddle anesthesia, no buckling, no fevers/chills  WEIGHT BEARING RESTRICTIONS: No  FALLS:  Has patient fallen in last 6 months? No - reports remote fall history, tripping over dog leash  LIVING ENVIRONMENT: Lives alone with her dog, [place] 1-2 STE, 1 level  No AD use  OCCUPATION: retired - worked  in [employer]  PLOF: Independent - enjoys spending time with her dog, playing piano, choir at [employer], being out in yard   PATIENT GOALS: get more limber, get moving more   NEXT MD VISIT: PCP in November per epic review, sports med PRN  OBJECTIVE:  Note: Objective measures were completed at Evaluation unless otherwise noted.  DIAGNOSTIC FINDINGS:  No recent spine imaging in chart - Lumbar MRI 2023, refer to EPIC for details  PATIENT SURVEYS:  ODI: 15/50; 30%  08/28/24 ODI: 9/50; 18%   COGNITION: Overall cognitive status: Within functional limits for tasks assessed     SENSATION/NEURO: Light touch intact BIL LE   No clonus either LE Negative hoffmann and tromner sign BIL No ataxia with gait   LUMBAR ROM:   AROM eval 08/28/24  Flexion Distal shin * Ankles *  Extension 75% initially relieving, then painful 100%  Right lateral flexion    Left lateral flexion    Right rotation 75% s * WNL *  Left rotation WNL WNL    (Blank rows = not tested) (Key: WFL = within functional limits not formally assessed, * = concordant pain, s = stiffness/stretching sensation, NT = not tested) Comment:   LOWER EXTREMITY ROM:      Right eval Left eval  Hip flexion    Hip extension    Hip internal rotation    Hip external rotation    Knee extension    Knee flexion    (Blank rows = not tested) (Key: WFL = within functional limits not formally assessed, * = concordant pain, s = stiffness/stretching sensation, NT = not tested)  Comments:    LOWER EXTREMITY MMT:    MMT Right eval Left eval R/L 08/28/24    Hip flexion 4 4 4/4  Hip abduction (modified sitting) 4+ 4+ 4+/4+  Hip internal rotation 4+ 4 4+/4+  Hip external rotation 4+ 4 4+/4+  Knee flexion 4 4- 4+/4-  Knee extension 4+ 4+ 4+/4+  Ankle dorsiflexion 5 5    (Blank rows = not tested) (Key: WFL = within functional limits not formally  assessed, * = concordant pain, s = stiffness/stretching sensation, NT = not tested)  Comments:       FUNCTIONAL TESTS:  5xSTS: 17.35sec gentle UE support from thighs : 369ft no AD, back pain from 3/10 initially to5/10  08/09/24  - 381ft no AD, no increase in back pain (remains 2/10)  08/28/24: - 5xSTS 12.75sec gentle UE support from thighs - 375ft no AD, 3/10 pain, muscular fatigue    GAIT: Distance walked: within clinic Assistive device utilized: None Level of assistance: Complete Independence Comments: widened BOS, reduced truncal rotation and arm swing    TREATMENT:   [facility] Adult PT Treatment:                                                DATE: 08/30/24 Therapeutic Exercise: Nu step 5 min L3 LE/UE for activity tolerance 3 way swiss ball rollout x30sec BIL Swiss ball core press down x12 Manual Therapy: STM to right hip near greater trochanter region, and bilateral low back Neuromuscular re-ed: Blue band paloff 2x10 Blue band kickstand row 2x8  Strong band 5# KB suitcase carry 2 laps BIL    [facility] Adult PT Treatment:                                                DATE: 08/28/24 Therapeutic Exercise: 3 way swiss ball rollout x30sec BIL Swiss ball core press down x12 HEP discussion/education  Therapeutic Activity: Nu step 5 min L3 LE/UE for activity tolerance MSK assessment + education 5xSTS + education + education Education/discussion re: progress with PT, symptom behavior as it affects activity tolerance, PT goals/POC     [facility] Adult PT Treatment:                                                DATE: 08/23/24 Therapeutic Exercise: Seated swiss ball three way rollout 2x8 cues for comfortable ROM  Seated hamstring stretch 3x30sec BIL cues for setup, w/ strap  Short lever open book x8 BIL cues for positioning and comfortable ROM  HEP update + education/discussion  Neuromuscular re-ed: Blue band paloff 2x8  Blue band kickstand row 2x8  Strong band 5# KB suitcase carry 2 laps BIL     [facility] Adult PT Treatment:                                                 DATE: 08/21/24  Neuromuscular re-ed: Superset: 4 inch step up x5 BIL, dynadisc step up x5 BIL; 4 rounds  Therapeutic Activity: 1:30 8# farmer carry 2 laps w 1 min rest ambulating after carries, cues for pacing 5# KB pickup x12 from 12 inch step, cues for hinge mechanics, keeping weight close to body Fwd swiss ball rollout x12 for bending tolerance      PATIENT EDUCATION:  Education details: rationale for interventions, HEP  Person educated: Patient Education method: Explanation, Demonstration, Tactile cues, Verbal cues Education comprehension: verbalized understanding,  returned demonstration, verbal cues required, tactile cues required, and needs further education     HOME EXERCISE PROGRAM: Access Code: [email or web address] URL: [email or web address] Date: 08/23/2024 Prepared by: Alm Jenny  Program Notes - with single arm row, please do split stance as done in clinic, please do not do single leg  Exercises - Supine Lower Trunk Rotation  - 2-3 x daily - 1 sets - 8 reps - Small Range Straight Leg Raise  - 2-3 x daily - 1 sets - 5 reps - Standing 3-Way Leg Reach with Resistance at Ankles and Counter Support  - 2-3 x daily - 1 sets - 5 reps - Standing Anti-Rotation Press with Anchored Resistance  - 2-3 x daily - 1 sets - 8-10 reps - Single Arm Row on One Leg with Resistance  - 2-3 x daily - 1 sets - 8 reps  ASSESSMENT:  CLINICAL IMPRESSION:    Progressed core strengthening exercises to promote overall endurance for walking further distances. Demonstrated good carryover from last session for proper form during banded exercises, requiring few cues. Manual therapy applied based on pt complaint of pain in right hip, she had tissue tightness especially in greater trochanteric region of right thigh. Pt left with significantly less pain and felt confident grocery shopping after skilled PT.   Per eval: Patient is a very pleasant 73 y.o. woman who was seen today  for physical therapy evaluation and treatment for chronic back pain. Endorses difficulty w/ repetitive tasks, household tasks, and prolonged ambulation. No red flags on exam today. Demonstrates concordant limitations with lumbar mobility and LE strength (particularly L hip). Fall risk and reduced functional mobility as indicated by 5xSTS time. Tolerates exam/HEP well overall, no adverse events. Notes relief w/ LTR and adduction iso. Recommend trial of skilled PT to address aforementioned deficits with aim of improving functional tolerance and reducing pain with typical activities. Pt departs today's session in no acute distress, all voiced concerns/questions addressed appropriately from PT perspective.    OBJECTIVE IMPAIRMENTS: Abnormal gait, decreased activity tolerance, decreased endurance, decreased mobility, difficulty walking, decreased ROM, decreased strength, impaired perceived functional ability, improper body mechanics, postural dysfunction, and pain.   ACTIVITY LIMITATIONS: carrying, lifting, bending, sitting, standing, squatting, transfers, and locomotion level  PARTICIPATION LIMITATIONS: meal prep, cleaning, laundry, and community activity  PERSONAL FACTORS: Age, Time since onset of injury/illness/exacerbation, and 3+ comorbidities: DM, HTN, thyroid  disease, CKD3 are also affecting patient's functional outcome.   REHAB POTENTIAL: Good  CLINICAL DECISION MAKING: Stable/uncomplicated  EVALUATION COMPLEXITY: Low   GOALS:  SHORT TERM GOALS: Target date: 08/16/2024  Pt will demonstrate appropriate understanding and performance of initially prescribed HEP in order to facilitate improved independence with management of symptoms.  Baseline: HEP established  08/14/24: reports good HEP performance Goal status: MET  2. Pt will report at least 25% improvement in overall pain levels over past week in order to facilitate improved tolerance to typical daily activities.   Baseline:  2-9/10 08/14/24: 0-4/10 Goal status: MET  LONG TERM GOALS: Target date: 09/13/2024   Pt will improve at least 15% on ODI in order to demonstrate improved perception of functional status due to symptoms.  Baseline: 30% 08/28/24: 9/50; 18%  Goal status: PROGRESSING  2.  Pt will demonstrate symmetrical lumbar rotation AROM in order to demonstrate improved tolerance to functional movement patterns.  Baseline: see ROM chart above 08/28/24: symmetrical, painful  Goal status: PARTIALLY MET  3.  Pt will demonstrate grossly symmetrical MMT of at least 4+/5 in tested groups  in order to demonstrate improved strength for functional movements.  Baseline: see MMT chart above 08/28/24: see MMT chart above  Goal status: PROGRESSING  4. Pt will perform 5xSTS in </=14 sec in order to demonstrate reduced fall risk and improved functional independence. (MCID of 2.3sec)  Baseline: 17sec gentle UE support  08/28/24: 12.75sec gentle UE support  Goal status: MET  5. Pt will report at least 50% decrease in overall pain levels in past week in order to facilitate improved tolerance to basic ADLs/mobility.   Baseline: 2-9/10  08/28/24: 2-4/10  Goal status: PARTIALLY MET    6. Pt will improve to at least 486ft w/ less than 3/10 pain on NPS in order to indicate improved functional tolerance.   Baseline: 374ft no AD, 5/10 pain  08/28/24: 367ft no AD   Goal status: PROGRESSING  PLAN:  PT FREQUENCY: 1-2x/week  PT DURATION: 8 weeks  PLANNED INTERVENTIONS: 97164- PT Re-evaluation, 97750- Physical Performance Testing, 97110-Therapeutic exercises, 97530- Therapeutic activity, 97112- Neuromuscular re-education, 97535- Self Care, [id number]- Manual therapy, [phone]- Gait training, [phone]- Aquatic Therapy, [phone]- Electrical stimulation (unattended), [phone] (1-2 muscles), [phone] (3+ muscles)- Dry Needling, Patient/Family education, Balance training, Stair training, Taping, Joint mobilization, Spinal mobilization, Cryotherapy,  and Moist heat.  PLAN FOR NEXT SESSION: Review/update HEP PRN. Work on [employer] exercises as appropriate with emphasis on gentle lumbar mobility, core/LE strength and endurance, introductory walking program. Symptom modification strategies as indicated/appropriate.   Lavanda Cleverly, SPT 08/30/24 3:56 PM

## 2024-08-30 ENCOUNTER — Encounter: Payer: Self-pay | Admitting: Physical Therapy

## 2024-08-30 ENCOUNTER — Ambulatory Visit: Admitting: Physical Therapy

## 2024-08-30 DIAGNOSIS — M6281 Muscle weakness (generalized): Secondary | ICD-10-CM | POA: Diagnosis not present

## 2024-08-30 DIAGNOSIS — M5459 Other low back pain: Secondary | ICD-10-CM

## 2024-09-01 DIAGNOSIS — Z23 Encounter for immunization: Secondary | ICD-10-CM | POA: Diagnosis not present

## 2024-09-04 ENCOUNTER — Ambulatory Visit: Admitting: Physical Therapy

## 2024-09-04 ENCOUNTER — Encounter: Payer: Self-pay | Admitting: Physical Therapy

## 2024-09-04 DIAGNOSIS — M6281 Muscle weakness (generalized): Secondary | ICD-10-CM | POA: Diagnosis not present

## 2024-09-04 DIAGNOSIS — M5459 Other low back pain: Secondary | ICD-10-CM

## 2024-09-04 NOTE — Therapy (Signed)
 OUTPATIENT PHYSICAL THERAPY TREATMENT    Patient Name: Regina Houston MRN: [id number] DOB:04-May-1951, 73 y.o., female Today's Date: 09/04/2024     END OF SESSION:  PT End of Session - 09/04/24 0927     Visit Number 12    Number of Visits 17    Date for Recertification  09/13/24    Authorization Type medicare    Progress Note Due on Visit 20    PT Start Time 0930    PT Stop Time 1013    PT Time Calculation (min) 43 min               Past Medical History:  Diagnosis Date   Allergy 2014   latex rash   Arthritis    Cataract    Diabetes mellitus 11/22/2005   Diverticulitis    Hyperlipidemia    Hypertension    Obesity    Thyroid  disease    Past Surgical History:  Procedure Laterality Date   ABDOMINAL HYSTERECTOMY  2008   fibroids   BIOPSY BREAST Left 2007   benign calcification   BIOPSY THYROID   08/2023   BRAIN SURGERY  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,benign menigioma   Capsulotomy MPJ Right 12/31/2016   RT FOOT, 3rd MPJ   FOOT SURGERY  2004   hammer toe   Hammer Toe Repair Right 12/31/2016   RT #3, #5 toes   Patient Active Problem List   Diagnosis Date Noted   Lymph edema 06/21/2024   Multiple thyroid  nodules 07/19/2023   Pulsatile visual field defect 04/28/2023   Hearing loss of left ear 10/06/2020   Encounter for chronic pain management 01/30/2020   CKD stage G3a/A1, GFR 45-59 and albumin creatinine ratio <30 mg/g (HCC) 10/02/2019   Left foot pain 01/10/2015   Severe obesity (BMI >= 40) (HCC) 08/28/2014   Cataract 08/12/2014   Hypothyroidism 05/28/2014   Lumbar spondylosis 02/26/2014   HLD (hyperlipidemia) 02/01/2014   Essential hypertension 09/20/2011   Primary osteoarthritis of both knees 09/20/2011   Asymptomatic postmenopausal status 04/22/2010   TROCHANTERIC BURSITIS, RIGHT 12/03/2009   Goiter 04/01/2009   Diabetes mellitus (HCC) 04/01/2009    PCP: Alvan Dorothyann BIRCH, MD  REFERRING PROVIDER: Charles Redell LABOR, DO  REFERRING DIAG: [phone]  (ICD-10-CM) - Lumbar spondylosis M54.50,G89.29 (ICD-10-CM) - Chronic bilateral low back pain without sciatica  Rationale for Evaluation and Treatment: Rehabilitation  THERAPY DIAG:  Other low back pain  Muscle weakness (generalized)  ONSET DATE: 10-15 years or more  SUBJECTIVE:  Per eval: Reports chronic back pain 10-15 years or so. Was managing with injections. Has done PT in the past with some success. Feels like it is getting stiffer over the past year or so, no change in activity or MOI. Feels like familiar symptoms just more stiff. Difficulty sleeping, pain/stiffness after heavier activities. Will infrequently get pain referring into legs, but no recent changes with this. Also has BIL knee pain.   SUBJECTIVE STATEMENT: 09/04/2024: states she got a little relief from manual last session but symptoms returned later that day. Symptoms back to baseline over weekend. Back doing okay. Walked the dog this morning. No other new updates.     PERTINENT HISTORY:  DM, HTN, thyroid  disease  PAIN:  Are you having pain: no pain at present  Per eval:  Location/description: central low back, aching/stiff Best-worst over past week: 2-9/10 - aggravating factors: mowing lawn, sleeping, vacuuming, any household activities involving stooped posture, walking dog  - Easing factors: injections, heating pad    PRECAUTIONS: None  RED FLAGS: None No bowel/bladder symptoms, no N/T, no saddle anesthesia, no buckling, no fevers/chills  WEIGHT BEARING RESTRICTIONS: No  FALLS:  Has patient fallen in last 6 months? No - reports remote fall history, tripping over dog leash  LIVING ENVIRONMENT: Lives alone with her dog, [place] 1-2 STE, 1 level  No AD use  OCCUPATION: retired - worked in [employer]  PLOF: Independent -  enjoys spending time with her dog, playing piano, choir at [employer], being out in yard   PATIENT GOALS: get more limber, get moving more   NEXT MD VISIT: PCP in November per epic review, sports med PRN  OBJECTIVE:  Note: Objective measures were completed at Evaluation unless otherwise noted.  DIAGNOSTIC FINDINGS:  No recent spine imaging in chart - Lumbar MRI 2023, refer to EPIC for details  PATIENT SURVEYS:  ODI: 15/50; 30%  08/28/24 ODI: 9/50; 18%   COGNITION: Overall cognitive status: Within functional limits for tasks assessed     SENSATION/NEURO: Light touch intact BIL LE   No clonus either LE Negative hoffmann and tromner sign BIL No ataxia with gait   LUMBAR ROM:   AROM eval 08/28/24  Flexion Distal shin * Ankles *  Extension 75% initially relieving, then painful 100%  Right lateral flexion    Left lateral flexion    Right rotation 75% s * WNL *  Left rotation WNL WNL    (Blank rows = not tested) (Key: WFL = within functional limits not formally assessed, * = concordant pain, s = stiffness/stretching sensation, NT = not tested) Comment:   LOWER EXTREMITY ROM:      Right eval Left eval  Hip flexion    Hip extension    Hip internal rotation    Hip external rotation    Knee extension    Knee flexion    (Blank rows = not tested) (Key: WFL = within functional limits not formally assessed, * = concordant pain, s = stiffness/stretching sensation, NT = not tested)  Comments:    LOWER EXTREMITY MMT:    MMT Right eval Left eval R/L 08/28/24    Hip flexion 4 4 4/4  Hip abduction (modified sitting) 4+ 4+ 4+/4+  Hip internal rotation 4+ 4 4+/4+  Hip external rotation 4+ 4 4+/4+  Knee flexion 4 4- 4+/4-  Knee extension 4+ 4+ 4+/4+  Ankle dorsiflexion 5 5    (Blank rows = not tested) (Key: WFL = within functional limits not formally assessed, * =  concordant pain, s = stiffness/stretching sensation, NT = not tested)  Comments:      FUNCTIONAL TESTS:   5xSTS: 17.35sec gentle UE support from thighs : 334ft no AD, back pain from 3/10 initially to5/10  08/09/24  - 337ft no AD, no increase in back pain (remains 2/10)  08/28/24: - 5xSTS 12.75sec gentle UE support from thighs - 344ft no AD, 3/10 pain, muscular fatigue    GAIT: Distance walked: within clinic Assistive device utilized: None Level of assistance: Complete Independence Comments: widened BOS, reduced truncal rotation and arm swing    TREATMENT:   [facility] Adult PT Treatment:                                                DATE: 09/04/24  Neuromuscular re-ed: 10# strongband KB suitcase carry 1 lap each way Blue band paloff 2x12 Bil cues for core engagement  Sidelying hip abduction 2x10 BIL cues for alignment   Therapeutic Activity: 10# KB suitcase carry 1 lap each way 5# STS + fwd chest press from lowest mat 2x5 cues for trunk mechanics BW hinge w/ UE support 2x8 cues for trunk mechanics  Education on squat vs hinge mechanics, rationale for each, appropriate performance Continued education re: walking program progression based on tolerance    [facility] Adult PT Treatment:                                                DATE: 08/30/24 Therapeutic Exercise: Nu step 5 min L3 LE/UE for activity tolerance 3 way swiss ball rollout x30sec BIL Swiss ball core press down x12 Manual Therapy: STM to right hip near greater trochanter region, and bilateral low back Neuromuscular re-ed: Blue band paloff 2x10 Blue band kickstand row 2x8  Strong band 5# KB suitcase carry 2 laps BIL    [facility] Adult PT Treatment:                                                DATE: 08/28/24 Therapeutic Exercise: 3 way swiss ball rollout x30sec BIL Swiss ball core press down x12 HEP discussion/education  Therapeutic Activity: Nu step 5 min L3 LE/UE for activity tolerance MSK assessment + education 5xSTS + education + education Education/discussion re: progress with PT, symptom  behavior as it affects activity tolerance, PT goals/POC     [facility] Adult PT Treatment:                                                DATE: 08/23/24 Therapeutic Exercise: Seated swiss ball three way rollout 2x8 cues for comfortable ROM  Seated hamstring stretch 3x30sec BIL cues for setup, w/ strap  Short lever open book x8 BIL cues for positioning and comfortable ROM  HEP update + education/discussion  Neuromuscular re-ed: Blue band paloff 2x8  Blue band kickstand row 2x8  Strong band 5# KB suitcase carry 2 laps BIL      PATIENT EDUCATION:  Education  details: rationale for interventions, HEP  Person educated: Patient Education method: Explanation, Demonstration, Tactile cues, Verbal cues Education comprehension: verbalized understanding, returned demonstration, verbal cues required, tactile cues required, and needs further education     HOME EXERCISE PROGRAM: Access Code: [email or web address] URL: [email or web address] Date: 09/04/2024 Prepared by: Alm Jenny  Program Notes - with single arm row, please do split stance as done in clinic, please do not do single leg  Exercises - Supine Lower Trunk Rotation  - 2-3 x daily - 1 sets - 8 reps - Small Range Straight Leg Raise  - 2-3 x daily - 1 sets - 5 reps - Standing 3-Way Leg Reach with Resistance at Ankles and Counter Support  - 2-3 x daily - 1 sets - 5 reps - Standing Anti-Rotation Press with Anchored Resistance  - 2-3 x daily - 1 sets - 8-10 reps - Single Arm Row on One Leg with Resistance  - 2-3 x daily - 1 sets - 8 reps - Sidelying Hip Abduction  - 2-3 x daily - 1 sets - 10 reps  ASSESSMENT:  CLINICAL IMPRESSION:   09/04/2024: Pt arrives w/ report of baseline symptoms. Today continuing to progress complexity for carries, increased volume with core strength/stability work. Cues as above throughout session.  Notes positive response to hinging, describes as feeling comparable to flexion stretches from past sessions.  Reports 3/10 aching/fatigue on departure, no adverse events. Recommend continuing along current POC in order to address relevant deficits and improve functional tolerance. Pt departs today's session in no acute distress, all voiced questions/concerns addressed appropriately from PT perspective.      Per eval: Patient is a very pleasant 73 y.o. woman who was seen today for physical therapy evaluation and treatment for chronic back pain. Endorses difficulty w/ repetitive tasks, household tasks, and prolonged ambulation. No red flags on exam today. Demonstrates concordant limitations with lumbar mobility and LE strength (particularly L hip). Fall risk and reduced functional mobility as indicated by 5xSTS time. Tolerates exam/HEP well overall, no adverse events. Notes relief w/ LTR and adduction iso. Recommend trial of skilled PT to address aforementioned deficits with aim of improving functional tolerance and reducing pain with typical activities. Pt departs today's session in no acute distress, all voiced concerns/questions addressed appropriately from PT perspective.    OBJECTIVE IMPAIRMENTS: Abnormal gait, decreased activity tolerance, decreased endurance, decreased mobility, difficulty walking, decreased ROM, decreased strength, impaired perceived functional ability, improper body mechanics, postural dysfunction, and pain.   ACTIVITY LIMITATIONS: carrying, lifting, bending, sitting, standing, squatting, transfers, and locomotion level  PARTICIPATION LIMITATIONS: meal prep, cleaning, laundry, and community activity  PERSONAL FACTORS: Age, Time since onset of injury/illness/exacerbation, and 3+ comorbidities: DM, HTN, thyroid  disease, CKD3 are also affecting patient's functional outcome.   REHAB POTENTIAL: Good  CLINICAL DECISION MAKING: Stable/uncomplicated  EVALUATION COMPLEXITY: Low   GOALS:  SHORT TERM GOALS: Target date: 08/16/2024  Pt will demonstrate appropriate understanding and  performance of initially prescribed HEP in order to facilitate improved independence with management of symptoms.  Baseline: HEP established  08/14/24: reports good HEP performance Goal status: MET  2. Pt will report at least 25% improvement in overall pain levels over past week in order to facilitate improved tolerance to typical daily activities.   Baseline: 2-9/10 08/14/24: 0-4/10 Goal status: MET  LONG TERM GOALS: Target date: 09/13/2024   Pt will improve at least 15% on ODI in order to demonstrate improved perception of functional status due to symptoms.  Baseline: 30% 08/28/24: 9/50; 18%  Goal status: PROGRESSING  2.  Pt will demonstrate symmetrical lumbar rotation AROM in order to demonstrate improved tolerance to functional movement patterns.  Baseline: see ROM chart above 08/28/24: symmetrical, painful  Goal status: PARTIALLY MET  3.  Pt will demonstrate grossly symmetrical MMT of at least 4+/5 in tested groups in order to demonstrate improved strength for functional movements.  Baseline: see MMT chart above 08/28/24: see MMT chart above  Goal status: PROGRESSING  4. Pt will perform 5xSTS in </=14 sec in order to demonstrate reduced fall risk and improved functional independence. (MCID of 2.3sec)  Baseline: 17sec gentle UE support  08/28/24: 12.75sec gentle UE support  Goal status: MET  5. Pt will report at least 50% decrease in overall pain levels in past week in order to facilitate improved tolerance to basic ADLs/mobility.   Baseline: 2-9/10  08/28/24: 2-4/10  Goal status: PARTIALLY MET    6. Pt will improve to at least 437ft w/ less than 3/10 pain on NPS in order to indicate improved functional tolerance.   Baseline: 350ft no AD, 5/10 pain  08/28/24: 359ft no AD   Goal status: PROGRESSING  PLAN:  PT FREQUENCY: 1-2x/week  PT DURATION: 8 weeks  PLANNED INTERVENTIONS: [id number]- PT Re-evaluation, 97750- Physical Performance Testing, 97110-Therapeutic exercises,  97530- Therapeutic activity, 97112- Neuromuscular re-education, 97535- Self Care, [id number]- Manual therapy, [phone]- Gait training, [phone]- Aquatic Therapy, [phone]- Electrical stimulation (unattended), [phone] (1-2 muscles), [phone] (3+ muscles)- Dry Needling, Patient/Family education, Balance training, Stair training, Taping, Joint mobilization, Spinal mobilization, Cryotherapy, and Moist heat.  PLAN FOR NEXT SESSION: Review/update HEP PRN. Work on [employer] exercises as appropriate with emphasis on gentle lumbar mobility, core/LE strength and endurance, introductory walking program. Symptom modification strategies as indicated/appropriate.   Alm DELENA Jenny PT, DPT 09/04/2024 10:16 AM

## 2024-09-05 NOTE — Therapy (Signed)
 OUTPATIENT PHYSICAL THERAPY TREATMENT    Patient Name: Regina Houston MRN: [id number] DOB:[birth year], 73 y.o., female Today's Date: 09/06/2024     END OF SESSION:  PT End of Session - 09/06/24 1403     Visit Number 13    Number of Visits 17    Date for Recertification  09/13/24    Authorization Type medicare    Progress Note Due on Visit 20    PT Start Time 1403    PT Stop Time 1442    PT Time Calculation (min) 39 min                Past Medical History:  Diagnosis Date   Allergy 2014   latex rash   Arthritis    Cataract    Diabetes mellitus 11/22/2005   Diverticulitis    Hyperlipidemia    Hypertension    Obesity    Thyroid  disease    Past Surgical History:  Procedure Laterality Date   ABDOMINAL HYSTERECTOMY  2008   fibroids   BIOPSY BREAST Left 2007   benign calcification   BIOPSY THYROID   08/2023   BRAIN SURGERY  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,benign menigioma   Capsulotomy MPJ Right 12/31/2016   RT FOOT, 3rd MPJ   FOOT SURGERY  2004   hammer toe   Hammer Toe Repair Right 12/31/2016   RT #3, #5 toes   Patient Active Problem List   Diagnosis Date Noted   Lymph edema 06/21/2024   Multiple thyroid  nodules 07/19/2023   Pulsatile visual field defect 04/28/2023   Hearing loss of left ear 10/06/2020   Encounter for chronic pain management 01/30/2020   CKD stage G3a/A1, GFR 45-59 and albumin creatinine ratio <30 mg/g (HCC) 10/02/2019   Left foot pain 01/10/2015   Severe obesity (BMI >= 40) (HCC) 08/28/2014   Cataract 08/12/2014   Hypothyroidism 05/28/2014   Lumbar spondylosis 02/26/2014   HLD (hyperlipidemia) 02/01/2014   Essential hypertension 09/20/2011   Primary osteoarthritis of both knees 09/20/2011   Asymptomatic postmenopausal status 04/22/2010   TROCHANTERIC BURSITIS, RIGHT 12/03/2009   Goiter 04/01/2009   Diabetes mellitus (HCC) 04/01/2009    PCP: Alvan Dorothyann BIRCH, MD  REFERRING PROVIDER: Charles Redell LABOR, DO  REFERRING DIAG: [id number]  (ICD-10-CM) - Lumbar spondylosis M54.50,G89.29 (ICD-10-CM) - Chronic bilateral low back pain without sciatica  Rationale for Evaluation and Treatment: Rehabilitation  THERAPY DIAG:  Other low back pain  Muscle weakness (generalized)  ONSET DATE: 10-15 years or more  SUBJECTIVE:  Per eval: Reports chronic back pain 10-15 years or so. Was managing with injections. Has done PT in the past with some success. Feels like it is getting stiffer over the past year or so, no change in activity or MOI. Feels like familiar symptoms just more stiff. Difficulty sleeping, pain/stiffness after heavier activities. Will infrequently get pain referring into legs, but no recent changes with this. Also has BIL knee pain.   SUBJECTIVE STATEMENT: 09/06/2024: hasn't been able to do HEP as much d/t being busy, but has been walking quite a bit. Back has been doing pretty well with it overall, but does note some pain when walking/twisting and walking her dog. No issues after last session. No other new updates    PERTINENT HISTORY:  DM, HTN, thyroid  disease  PAIN:  Are you having pain: 2/10 R sided low back   Per eval:  Location/description: central low back, aching/stiff Best-worst over past week: 2-9/10 - aggravating factors: mowing lawn, sleeping, vacuuming, any household activities involving stooped posture, walking dog  - Easing factors: injections, heating pad    PRECAUTIONS: None  RED FLAGS: None No bowel/bladder symptoms, no N/T, no saddle anesthesia, no buckling, no fevers/chills  WEIGHT BEARING RESTRICTIONS: No  FALLS:  Has patient fallen in last 6 months? No - reports remote fall history, tripping over dog leash  LIVING ENVIRONMENT: Lives alone with her dog, [place] 1-2 STE, 1 level  No AD use  OCCUPATION:  retired - worked in [employer]  PLOF: Independent - enjoys spending time with her dog, playing piano, choir at [employer], being out in yard   PATIENT GOALS: get more limber, get moving more   NEXT MD VISIT: PCP in November per epic review, sports med PRN  OBJECTIVE:  Note: Objective measures were completed at Evaluation unless otherwise noted.  DIAGNOSTIC FINDINGS:  No recent spine imaging in chart - Lumbar MRI 2023, refer to EPIC for details  PATIENT SURVEYS:  ODI: 15/50; 30%  08/28/24 ODI: 9/50; 18%   COGNITION: Overall cognitive status: Within functional limits for tasks assessed     SENSATION/NEURO: Light touch intact BIL LE   No clonus either LE Negative hoffmann and tromner sign BIL No ataxia with gait   LUMBAR ROM:   AROM eval 08/28/24  Flexion Distal shin * Ankles *  Extension 75% initially relieving, then painful 100%  Right lateral flexion    Left lateral flexion    Right rotation 75% s * WNL *  Left rotation WNL WNL    (Blank rows = not tested) (Key: WFL = within functional limits not formally assessed, * = concordant pain, s = stiffness/stretching sensation, NT = not tested) Comment:   LOWER EXTREMITY ROM:      Right eval Left eval  Hip flexion    Hip extension    Hip internal rotation    Hip external rotation    Knee extension    Knee flexion    (Blank rows = not tested) (Key: WFL = within functional limits not formally assessed, * = concordant pain, s = stiffness/stretching sensation, NT = not tested)  Comments:    LOWER EXTREMITY MMT:    MMT Right eval Left eval R/L 08/28/24    Hip flexion 4 4 4/4  Hip abduction (modified sitting) 4+ 4+ 4+/4+  Hip internal rotation 4+ 4 4+/4+  Hip external rotation 4+ 4 4+/4+  Knee flexion 4 4- 4+/4-  Knee extension 4+ 4+ 4+/4+  Ankle dorsiflexion 5 5    (Blank  rows = not tested) (Key: WFL = within functional limits not formally assessed, * = concordant pain, s = stiffness/stretching sensation, NT = not  tested)  Comments:      FUNCTIONAL TESTS:  5xSTS: 17.35sec gentle UE support from thighs : 327ft no AD, back pain from 3/10 initially to5/10  08/09/24  - 387ft no AD, no increase in back pain (remains 2/10)  08/28/24: - 5xSTS 12.75sec gentle UE support from thighs - 323ft no AD, 3/10 pain, muscular fatigue    GAIT: Distance walked: within clinic Assistive device utilized: None Level of assistance: Complete Independence Comments: widened BOS, reduced truncal rotation and arm swing    TREATMENT:   [facility] Adult PT Treatment:                                                DATE: 09/06/24  Neuromuscular re-ed: Sidelying hip abd 2x12 BIL  Kickstand paloff [place] band x10 BIL  Staggered stance drawing the bow (short lever) 2x10 BIL  Therapeutic Activity: BW hinge 2x12 cues for posterior chain activation   Paced walking (slow turns, quick straightaways) 2 laps CW/CCW Education/discussion re: pacing, walking program at home, PT goals/progression and POC   [facility] Adult PT Treatment:                                                DATE: 09/04/24  Neuromuscular re-ed: 10# strongband KB suitcase carry 1 lap each way Blue band paloff 2x12 Bil cues for core engagement  Sidelying hip abduction 2x10 BIL cues for alignment   Therapeutic Activity: 10# KB suitcase carry 1 lap each way 5# STS + fwd chest press from lowest mat 2x5 cues for trunk mechanics BW hinge w/ UE support 2x8 cues for trunk mechanics  Education on squat vs hinge mechanics, rationale for each, appropriate performance Continued education re: walking program progression based on tolerance    [facility] Adult PT Treatment:                                                DATE: 08/30/24 Therapeutic Exercise: Nu step 5 min L3 LE/UE for activity tolerance 3 way swiss ball rollout x30sec BIL Swiss ball core press down x12 Manual Therapy: STM to right hip near greater trochanter region, and bilateral low  back Neuromuscular re-ed: Blue band paloff 2x10 Blue band kickstand row 2x8  Strong band 5# KB suitcase carry 2 laps BIL    [facility] Adult PT Treatment:                                                DATE: 08/28/24 Therapeutic Exercise: 3 way swiss ball rollout x30sec BIL Swiss ball core press down x12 HEP discussion/education  Therapeutic Activity: Nu step 5 min L3 LE/UE for activity tolerance MSK assessment + education 5xSTS + education + education Education/discussion re: progress with PT, symptom behavior as it affects activity tolerance, PT goals/POC  [facility] Adult PT Treatment:                                                DATE: 08/23/24 Therapeutic Exercise: Seated swiss ball three way rollout 2x8 cues for comfortable ROM  Seated hamstring stretch 3x30sec BIL cues for setup, w/ strap  Short lever open book x8 BIL cues for positioning and comfortable ROM  HEP update + education/discussion  Neuromuscular re-ed: Blue band paloff 2x8  Blue band kickstand row 2x8  Strong band 5# KB suitcase carry 2 laps BIL      PATIENT EDUCATION:  Education details: rationale for interventions, HEP  Person educated: Patient Education method: Explanation, Demonstration, Tactile cues, Verbal cues Education comprehension: verbalized understanding, returned demonstration, verbal cues required, tactile cues required, and needs further education     HOME EXERCISE PROGRAM: Access Code: [email or web address] URL: [email or web address] Date: 09/04/2024 Prepared by: Alm Jenny  Program Notes - with single arm row, please do split stance as done in clinic, please do not do single leg  Exercises - Supine Lower Trunk Rotation  - 2-3 x daily - 1 sets - 8 reps - Small Range Straight Leg Raise  - 2-3 x daily - 1 sets - 5 reps - Standing 3-Way Leg Reach with Resistance at Ankles and Counter Support  - 2-3 x daily - 1 sets - 5 reps - Standing Anti-Rotation Press with Anchored Resistance   - 2-3 x daily - 1 sets - 8-10 reps - Single Arm Row on One Leg with Resistance  - 2-3 x daily - 1 sets - 8 reps - Sidelying Hip Abduction  - 2-3 x daily - 1 sets - 10 reps  ASSESSMENT:  CLINICAL IMPRESSION:   09/06/2024: Pt arrives w/ 2/10 R sided low back pain, continuing to endorse gradual progress. Today continuing to work on lumbar and hip endurance/strength which she does well with. Increased emphasis on rotational core stability given report of continued difficulty twisting (dog on leash). Tolerates well with muscular fatigue as expected but no increase in pain, no adverse events. Does require increased rest breaks with hinging and paced walking. Cues as above. Recommend continuing along current POC in order to address relevant deficits and improve functional tolerance. Pt departs today's session in no acute distress, all voiced questions/concerns addressed appropriately from PT perspective.         Per eval: Patient is a very pleasant 73 y.o. woman who was seen today for physical therapy evaluation and treatment for chronic back pain. Endorses difficulty w/ repetitive tasks, household tasks, and prolonged ambulation. No red flags on exam today. Demonstrates concordant limitations with lumbar mobility and LE strength (particularly L hip). Fall risk and reduced functional mobility as indicated by 5xSTS time. Tolerates exam/HEP well overall, no adverse events. Notes relief w/ LTR and adduction iso. Recommend trial of skilled PT to address aforementioned deficits with aim of improving functional tolerance and reducing pain with typical activities. Pt departs today's session in no acute distress, all voiced concerns/questions addressed appropriately from PT perspective.    OBJECTIVE IMPAIRMENTS: Abnormal gait, decreased activity tolerance, decreased endurance, decreased mobility, difficulty walking, decreased ROM, decreased strength, impaired perceived functional ability, improper body mechanics,  postural dysfunction, and pain.   ACTIVITY LIMITATIONS: carrying, lifting, bending, sitting, standing, squatting, transfers, and locomotion level  PARTICIPATION LIMITATIONS: meal prep, cleaning, laundry, and community activity  PERSONAL FACTORS: Age, Time since onset of injury/illness/exacerbation, and 3+ comorbidities: DM, HTN, thyroid  disease, CKD3 are also affecting patient's functional outcome.   REHAB POTENTIAL: Good  CLINICAL DECISION MAKING: Stable/uncomplicated  EVALUATION COMPLEXITY: Low   GOALS:  SHORT TERM GOALS: Target date: 08/16/2024  Pt will demonstrate appropriate understanding and performance of initially prescribed HEP in order to facilitate improved independence with management of symptoms.  Baseline: HEP established  08/14/24: reports good HEP performance Goal status: MET  2. Pt will report at least 25% improvement in overall pain levels over past week in order to facilitate improved tolerance to typical daily activities.   Baseline: 2-9/10 08/14/24: 0-4/10 Goal status: MET  LONG TERM GOALS: Target date: 09/13/2024   Pt will improve at least 15% on ODI in order to demonstrate improved perception of functional status due to symptoms.  Baseline: 30% 08/28/24: 9/50; 18%  Goal status: PROGRESSING  2.  Pt will demonstrate symmetrical lumbar rotation AROM in order to demonstrate improved tolerance to functional movement patterns.  Baseline: see ROM chart above 08/28/24: symmetrical, painful  Goal status: PARTIALLY MET  3.  Pt will demonstrate grossly symmetrical MMT of at least 4+/5 in tested groups in order to demonstrate improved strength for functional movements.  Baseline: see MMT chart above 08/28/24: see MMT chart above  Goal status: PROGRESSING  4. Pt will perform 5xSTS in </=14 sec in order to demonstrate reduced fall risk and improved functional independence. (MCID of 2.3sec)  Baseline: 17sec gentle UE support  08/28/24: 12.75sec gentle UE  support  Goal status: MET  5. Pt will report at least 50% decrease in overall pain levels in past week in order to facilitate improved tolerance to basic ADLs/mobility.   Baseline: 2-9/10  08/28/24: 2-4/10  Goal status: PARTIALLY MET    6. Pt will improve to at least 446ft w/ less than 3/10 pain on NPS in order to indicate improved functional tolerance.   Baseline: 370ft no AD, 5/10 pain  08/28/24: 374ft no AD   Goal status: PROGRESSING  PLAN:  PT FREQUENCY: 1-2x/week  PT DURATION: 8 weeks  PLANNED INTERVENTIONS: [id number]- PT Re-evaluation, 97750- Physical Performance Testing, 97110-Therapeutic exercises, 97530- Therapeutic activity, 97112- Neuromuscular re-education, 97535- Self Care, [id number]- Manual therapy, [phone]- Gait training, [phone]- Aquatic Therapy, [phone]- Electrical stimulation (unattended), [phone] (1-2 muscles), [phone] (3+ muscles)- Dry Needling, Patient/Family education, Balance training, Stair training, Taping, Joint mobilization, Spinal mobilization, Cryotherapy, and Moist heat.  PLAN FOR NEXT SESSION: Review/update HEP PRN. Work on [employer] exercises as appropriate with emphasis on gentle lumbar mobility, core/LE strength and endurance, introductory walking program. Symptom modification strategies as indicated/appropriate.   Alm DELENA Jenny PT, DPT 09/06/2024 2:43 PM

## 2024-09-06 ENCOUNTER — Ambulatory Visit: Admitting: Physical Therapy

## 2024-09-06 ENCOUNTER — Encounter: Payer: Self-pay | Admitting: Physical Therapy

## 2024-09-06 DIAGNOSIS — M6281 Muscle weakness (generalized): Secondary | ICD-10-CM | POA: Diagnosis not present

## 2024-09-06 DIAGNOSIS — M5459 Other low back pain: Secondary | ICD-10-CM

## 2024-09-11 ENCOUNTER — Encounter: Payer: Self-pay | Admitting: Physical Therapy

## 2024-09-11 ENCOUNTER — Ambulatory Visit: Payer: Self-pay | Admitting: Physical Therapy

## 2024-09-11 DIAGNOSIS — M5459 Other low back pain: Secondary | ICD-10-CM | POA: Diagnosis not present

## 2024-09-11 DIAGNOSIS — M6281 Muscle weakness (generalized): Secondary | ICD-10-CM

## 2024-09-11 NOTE — Therapy (Signed)
 OUTPATIENT PHYSICAL THERAPY TREATMENT    Patient Name: Regina Houston MRN: [id number] DOB:06-01-1951, 73 y.o., female Today's Date: 09/11/2024     END OF SESSION:  PT End of Session - 09/11/24 1449     Visit Number 14    Number of Visits 17    Date for Recertification  09/13/24    Authorization Type medicare    Progress Note Due on Visit 20    PT Start Time 1449    PT Stop Time 1530    PT Time Calculation (min) 41 min                 Past Medical History:  Diagnosis Date   Allergy 2014   latex rash   Arthritis    Cataract    Diabetes mellitus 11/22/2005   Diverticulitis    Hyperlipidemia    Hypertension    Obesity    Thyroid  disease    Past Surgical History:  Procedure Laterality Date   ABDOMINAL HYSTERECTOMY  2008   fibroids   BIOPSY BREAST Left 2007   benign calcification   BIOPSY THYROID   08/2023   BRAIN SURGERY  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,benign menigioma   Capsulotomy MPJ Right 12/31/2016   RT FOOT, 3rd MPJ   FOOT SURGERY  2004   hammer toe   Hammer Toe Repair Right 12/31/2016   RT #3, #5 toes   Patient Active Problem List   Diagnosis Date Noted   Lymph edema 06/21/2024   Multiple thyroid  nodules 07/19/2023   Pulsatile visual field defect 04/28/2023   Hearing loss of left ear 10/06/2020   Encounter for chronic pain management 01/30/2020   CKD stage G3a/A1, GFR 45-59 and albumin creatinine ratio <30 mg/g (HCC) 10/02/2019   Left foot pain 01/10/2015   Severe obesity (BMI >= 40) (HCC) 08/28/2014   Cataract 08/12/2014   Hypothyroidism 05/28/2014   Lumbar spondylosis 02/26/2014   HLD (hyperlipidemia) 02/01/2014   Essential hypertension 09/20/2011   Primary osteoarthritis of both knees 09/20/2011   Asymptomatic postmenopausal status 04/22/2010   TROCHANTERIC BURSITIS, RIGHT 12/03/2009   Goiter 04/01/2009   Diabetes mellitus (HCC) 04/01/2009    PCP: Alvan Dorothyann BIRCH, MD  REFERRING PROVIDER: Charles Redell LABOR, DO  REFERRING DIAG: [phone]  (ICD-10-CM) - Lumbar spondylosis M54.50,G89.29 (ICD-10-CM) - Chronic bilateral low back pain without sciatica  Rationale for Evaluation and Treatment: Rehabilitation  THERAPY DIAG:  Other low back pain  Muscle weakness (generalized)  ONSET DATE: 10-15 years or more  SUBJECTIVE:  Per eval: Reports chronic back pain 10-15 years or so. Was managing with injections. Has done PT in the past with some success. Feels like it is getting stiffer over the past year or so, no change in activity or MOI. Feels like familiar symptoms just more stiff. Difficulty sleeping, pain/stiffness after heavier activities. Will infrequently get pain referring into legs, but no recent changes with this. Also has BIL knee pain.   SUBJECTIVE STATEMENT: 09/11/2024: mowed the lawn this weekend - did irritate the back a bit but notes improved tolerance compared to start of care, also recovered a bit more quickly. Felt good after last session. States she feels like she may be ready to discharge in coming visits.     PERTINENT HISTORY:  DM, HTN, thyroid  disease  PAIN:  Are you having pain: 2.5/10 R low back   Per eval:  Location/description: central low back, aching/stiff Best-worst over past week: 2-9/10 - aggravating factors: mowing lawn, sleeping, vacuuming, any household activities involving stooped posture, walking dog  - Easing factors: injections, heating pad    PRECAUTIONS: None  RED FLAGS: None No bowel/bladder symptoms, no N/T, no saddle anesthesia, no buckling, no fevers/chills  WEIGHT BEARING RESTRICTIONS: No  FALLS:  Has patient fallen in last 6 months? No - reports remote fall history, tripping over dog leash  LIVING ENVIRONMENT: Lives alone with her dog, [place] 1-2 STE, 1 level  No AD use  OCCUPATION: retired  - worked in [employer]  PLOF: Independent - enjoys spending time with her dog, playing piano, choir at [employer], being out in yard   PATIENT GOALS: get more limber, get moving more   NEXT MD VISIT: PCP in November per epic review, sports med PRN  OBJECTIVE:  Note: Objective measures were completed at Evaluation unless otherwise noted.  DIAGNOSTIC FINDINGS:  No recent spine imaging in chart - Lumbar MRI 2023, refer to EPIC for details  PATIENT SURVEYS:  ODI: 15/50; 30%  08/28/24 ODI: 9/50; 18%   COGNITION: Overall cognitive status: Within functional limits for tasks assessed     SENSATION/NEURO: Light touch intact BIL LE   No clonus either LE Negative hoffmann and tromner sign BIL No ataxia with gait   LUMBAR ROM:   AROM eval 08/28/24  Flexion Distal shin * Ankles *  Extension 75% initially relieving, then painful 100%  Right lateral flexion    Left lateral flexion    Right rotation 75% s * WNL *  Left rotation WNL WNL    (Blank rows = not tested) (Key: WFL = within functional limits not formally assessed, * = concordant pain, s = stiffness/stretching sensation, NT = not tested) Comment:   LOWER EXTREMITY ROM:      Right eval Left eval  Hip flexion    Hip extension    Hip internal rotation    Hip external rotation    Knee extension    Knee flexion    (Blank rows = not tested) (Key: WFL = within functional limits not formally assessed, * = concordant pain, s = stiffness/stretching sensation, NT = not tested)  Comments:    LOWER EXTREMITY MMT:    MMT Right eval Left eval R/L 08/28/24    Hip flexion 4 4 4/4  Hip abduction (modified sitting) 4+ 4+ 4+/4+  Hip internal rotation 4+ 4 4+/4+  Hip external rotation 4+ 4 4+/4+  Knee flexion 4 4- 4+/4-  Knee extension 4+ 4+ 4+/4+  Ankle dorsiflexion 5 5    (Blank rows =  not tested) (Key: WFL = within functional limits not formally assessed, * = concordant pain, s = stiffness/stretching sensation, NT = not tested)   Comments:      FUNCTIONAL TESTS:  5xSTS: 17.35sec gentle UE support from thighs : 365ft no AD, back pain from 3/10 initially to5/10  08/09/24  - 367ft no AD, no increase in back pain (remains 2/10)  08/28/24: - 5xSTS 12.75sec gentle UE support from thighs - 374ft no AD, 3/10 pain, muscular fatigue    GAIT: Distance walked: within clinic Assistive device utilized: None Level of assistance: Complete Independence Comments: widened BOS, reduced truncal rotation and arm swing    TREATMENT:   [facility] Adult PT Treatment:                                                DATE: 09/11/24  Neuromuscular re-ed: Blue band paloff 2x10 BIL Staggered stance high>low unilat row blue band 2x10 BIL Solana band hip 3 way reach w/ UE support x10   Therapeutic Activity: 10# suitcase carry 2 laps each direction Paced walking (4 laps, 2 CW 2 CCW) quick straightaways, 3.8sec to 4.5 sec Seated swiss ball reach x10  Education/discussion re: walking program, activity progression/modification, progress since starting PT, education re: rationale for interventions to maximize self efficacy w/ managing activities     [facility] Adult PT Treatment:                                                DATE: 09/06/24  Neuromuscular re-ed: Sidelying hip abd 2x12 BIL  Kickstand paloff Lenn[place] band x10 BIL  Staggered stance drawing the bow (short lever) 2x10 BIL  Therapeutic Activity: BW hinge 2x12 cues for posterior chain activation   Paced walking (slow turns, quick straightaways) 2 laps CW/CCW Education/discussion re: pacing, walking program at home, PT goals/progression and POC   [facility] Adult PT Treatment:                                                DATE: 09/04/24  Neuromuscular re-ed: 10# strongband KB suitcase carry 1 lap each way Blue band paloff 2x12 Bil cues for core engagement  Sidelying hip abduction 2x10 BIL cues for alignment   Therapeutic Activity: 10# KB suitcase carry 1 lap each  way 5# STS + fwd chest press from lowest mat 2x5 cues for trunk mechanics BW hinge w/ UE support 2x8 cues for trunk mechanics  Education on squat vs hinge mechanics, rationale for each, appropriate performance Continued education re: walking program progression based on tolerance    PATIENT EDUCATION:  Education details: rationale for interventions, HEP  Person educated: Patient Education method: Explanation, Demonstration, Tactile cues, Verbal cues Education comprehension: verbalized understanding, returned demonstration, verbal cues required, tactile cues required, and needs further education     HOME EXERCISE PROGRAM: Access Code: [email or web address] URL: [email or web address] Date: 09/11/2024 Prepared by: Alm Jenny  Program Notes - with single arm row, please do split stance as done in clinic, please do not do single leg  Exercises - Standing 3-Way Leg Reach with Resistance at Ankles and  Counter Support  - 2-3 x daily - 1 sets - 5 reps - Standing Anti-Rotation Press with Anchored Resistance  - 2-3 x daily - 1 sets - 8-10 reps - Single Arm Row on One Leg with Resistance  - 2-3 x daily - 1 sets - 8 reps - Seated Flexion Stretch with Swiss Ball  - 2-3 x daily - 1 sets - 8 reps  ASSESSMENT:  CLINICAL IMPRESSION:   09/11/2024: Pt arrives w/ baseline back pain, notes improved tolerance to yardwork over the weekend compared to start of care. Today focusing on maximizing self efficacy w/ exercise program focusing on functional strengthening (hinge, carries) and core/hip stability. HEP updated accordingly. Tolerates session well without increase in pain, no adverse events, muscular fatigue as expected. Recommend continuing along current POC in order to address relevant deficits and improve functional tolerance. Pt departs today's session in no acute distress, all voiced questions/concerns addressed appropriately from PT perspective.      Per eval: Patient is a very pleasant 73  y.o. woman who was seen today for physical therapy evaluation and treatment for chronic back pain. Endorses difficulty w/ repetitive tasks, household tasks, and prolonged ambulation. No red flags on exam today. Demonstrates concordant limitations with lumbar mobility and LE strength (particularly L hip). Fall risk and reduced functional mobility as indicated by 5xSTS time. Tolerates exam/HEP well overall, no adverse events. Notes relief w/ LTR and adduction iso. Recommend trial of skilled PT to address aforementioned deficits with aim of improving functional tolerance and reducing pain with typical activities. Pt departs today's session in no acute distress, all voiced concerns/questions addressed appropriately from PT perspective.    OBJECTIVE IMPAIRMENTS: Abnormal gait, decreased activity tolerance, decreased endurance, decreased mobility, difficulty walking, decreased ROM, decreased strength, impaired perceived functional ability, improper body mechanics, postural dysfunction, and pain.   ACTIVITY LIMITATIONS: carrying, lifting, bending, sitting, standing, squatting, transfers, and locomotion level  PARTICIPATION LIMITATIONS: meal prep, cleaning, laundry, and community activity  PERSONAL FACTORS: Age, Time since onset of injury/illness/exacerbation, and 3+ comorbidities: DM, HTN, thyroid  disease, CKD3 are also affecting patient's functional outcome.   REHAB POTENTIAL: Good  CLINICAL DECISION MAKING: Stable/uncomplicated  EVALUATION COMPLEXITY: Low   GOALS:  SHORT TERM GOALS: Target date: 08/16/2024  Pt will demonstrate appropriate understanding and performance of initially prescribed HEP in order to facilitate improved independence with management of symptoms.  Baseline: HEP established  08/14/24: reports good HEP performance Goal status: MET  2. Pt will report at least 25% improvement in overall pain levels over past week in order to facilitate improved tolerance to typical daily  activities.   Baseline: 2-9/10 08/14/24: 0-4/10 Goal status: MET  LONG TERM GOALS: Target date: 09/13/2024   Pt will improve at least 15% on ODI in order to demonstrate improved perception of functional status due to symptoms.  Baseline: 30% 08/28/24: 9/50; 18%  Goal status: PROGRESSING  2.  Pt will demonstrate symmetrical lumbar rotation AROM in order to demonstrate improved tolerance to functional movement patterns.  Baseline: see ROM chart above 08/28/24: symmetrical, painful  Goal status: PARTIALLY MET  3.  Pt will demonstrate grossly symmetrical MMT of at least 4+/5 in tested groups in order to demonstrate improved strength for functional movements.  Baseline: see MMT chart above 08/28/24: see MMT chart above  Goal status: PROGRESSING  4. Pt will perform 5xSTS in </=14 sec in order to demonstrate reduced fall risk and improved functional independence. (MCID of 2.3sec)  Baseline: 17sec gentle UE support  08/28/24: 12.75sec  gentle UE support  Goal status: MET  5. Pt will report at least 50% decrease in overall pain levels in past week in order to facilitate improved tolerance to basic ADLs/mobility.   Baseline: 2-9/10  08/28/24: 2-4/10  Goal status: PARTIALLY MET    6. Pt will improve to at least 454ft w/ less than 3/10 pain on NPS in order to indicate improved functional tolerance.   Baseline: 345ft no AD, 5/10 pain  08/28/24: 314ft no AD   Goal status: PROGRESSING  PLAN:  PT FREQUENCY: 1-2x/week  PT DURATION: 8 weeks  PLANNED INTERVENTIONS: 97164- PT Re-evaluation, 97750- Physical Performance Testing, 97110-Therapeutic exercises, 97530- Therapeutic activity, 97112- Neuromuscular re-education, 97535- Self Care, [id number]- Manual therapy, [phone]- Gait training, [phone]- Aquatic Therapy, [phone]- Electrical stimulation (unattended), [phone] (1-2 muscles), [phone] (3+ muscles)- Dry Needling, Patient/Family education, Balance training, Stair training, Taping, Joint mobilization, Spinal  mobilization, Cryotherapy, and Moist heat.  PLAN FOR NEXT SESSION: Review/update HEP PRN. Work on [employer] exercises as appropriate with emphasis on gentle lumbar mobility, core/LE strength and endurance, introductory walking program. Symptom modification strategies as indicated/appropriate.   Alm DELENA Jenny PT, DPT 09/11/2024 3:51 PM

## 2024-09-13 ENCOUNTER — Ambulatory Visit: Payer: Self-pay | Admitting: Physical Therapy

## 2024-09-13 ENCOUNTER — Encounter: Payer: Self-pay | Admitting: Physical Therapy

## 2024-09-13 DIAGNOSIS — M5459 Other low back pain: Secondary | ICD-10-CM | POA: Diagnosis not present

## 2024-09-13 DIAGNOSIS — M6281 Muscle weakness (generalized): Secondary | ICD-10-CM | POA: Diagnosis not present

## 2024-09-13 NOTE — Therapy (Signed)
 OUTPATIENT PHYSICAL THERAPY TREATMENT  + DISCHARGE   Patient Name: Regina Houston MRN: [id number] DOB:Sep 02, 1951, 73 y.o., female Today's Date: 09/13/2024   PHYSICAL THERAPY DISCHARGE SUMMARY  Visits from Start of Care: 15  Current functional level related to goals / functional outcomes: Reports ability to perform usual household/community tasks without limitation, mild pain   Remaining deficits: Pain, reduced mobility, reduced LE strength, reduced activity tolerance   Education / Equipment: HEP, discharge education, follow up with providers as indicated, activity modification and walking program    Patient agrees to discharge. Patient goals were partially met. Patient is being discharged due to being pleased with the current functional level, partially/nearly meeting majority of rehab goals.      END OF SESSION:  PT End of Session - 09/13/24 1444     Visit Number 15    Number of Visits 17    Date for Recertification  09/13/24    Authorization Type medicare    PT Start Time 1444    PT Stop Time 1523    PT Time Calculation (min) 39 min              Past Medical History:  Diagnosis Date   Allergy 2014   latex rash   Arthritis    Cataract    Diabetes mellitus 11/22/2005   Diverticulitis    Hyperlipidemia    Hypertension    Obesity    Thyroid  disease    Past Surgical History:  Procedure Laterality Date   ABDOMINAL HYSTERECTOMY  2008   fibroids   BIOPSY BREAST Left 2007   benign calcification   BIOPSY THYROID   08/2023   BRAIN SURGERY  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,benign menigioma   Capsulotomy MPJ Right 12/31/2016   RT FOOT, 3rd MPJ   FOOT SURGERY  2004   hammer toe   Hammer Toe Repair Right 12/31/2016   RT #3, #5 toes   Patient Active Problem List   Diagnosis Date Noted   Lymph edema 06/21/2024   Multiple thyroid  nodules 07/19/2023   Pulsatile visual field defect 04/28/2023   Hearing loss of left ear 10/06/2020   Encounter for chronic pain management  01/30/2020   CKD stage G3a/A1, GFR 45-59 and albumin creatinine ratio <30 mg/g (HCC) 10/02/2019   Left foot pain 01/10/2015   Severe obesity (BMI >= 40) (HCC) 08/28/2014   Cataract 08/12/2014   Hypothyroidism 05/28/2014   Lumbar spondylosis 02/26/2014   HLD (hyperlipidemia) 02/01/2014   Essential hypertension 09/20/2011   Primary osteoarthritis of both knees 09/20/2011   Asymptomatic postmenopausal status 04/22/2010   TROCHANTERIC BURSITIS, RIGHT 12/03/2009   Goiter 04/01/2009   Diabetes mellitus (HCC) 04/01/2009    PCP: Alvan Dorothyann BIRCH, MD  REFERRING PROVIDER: Charles Redell LABOR, DO  REFERRING DIAG: [phone] (ICD-10-CM) - Lumbar spondylosis M54.50,G89.29 (ICD-10-CM) - Chronic bilateral low back pain without sciatica  Rationale for Evaluation and Treatment: Rehabilitation  THERAPY DIAG:  Other low back pain  Muscle weakness (generalized)  ONSET DATE: 10-15 years or more  SUBJECTIVE:  Per eval: Reports chronic back pain 10-15 years or so. Was managing with injections. Has done PT in the past with some success. Feels like it is getting stiffer over the past year or so, no change in activity or MOI. Feels like familiar symptoms just more stiff. Difficulty sleeping, pain/stiffness after heavier activities. Will infrequently get pain referring into legs, but no recent changes with this. Also has BIL knee pain.   SUBJECTIVE STATEMENT: 09/13/2024: 2/10 R sided low back pain at present. Still has some pain with mowing lawn, vacuuming, bending forward. No trouble sleeping at this point, not having much trouble walking dog at this point. States she feels confident discharging today.   PERTINENT HISTORY:  DM, HTN, thyroid  disease  PAIN:  Are you having pain: 2/10 R sided low back, no more than 3/10 in  past week  Per eval:  Location/description: central low back, aching/stiff Best-worst over past week: 2-9/10 - aggravating factors: mowing lawn, sleeping, vacuuming, any household activities involving stooped posture, walking dog  - Easing factors: injections, heating pad    PRECAUTIONS: None  RED FLAGS: None No bowel/bladder symptoms, no N/T, no saddle anesthesia, no buckling, no fevers/chills  WEIGHT BEARING RESTRICTIONS: No  FALLS:  Has patient fallen in last 6 months? No - reports remote fall history, tripping over dog leash  LIVING ENVIRONMENT: Lives alone with her dog, [place] 1-2 STE, 1 level  No AD use  OCCUPATION: retired - worked in Air[employer] pilot  PLOF: Independent - enjoys spending time with her dog, playing piano, choir at [employer], being out in yard   PATIENT GOALS: get more limber, get moving more   NEXT MD VISIT: PCP in November per epic review, sports med PRN  OBJECTIVE:  Note: Objective measures were completed at Evaluation unless otherwise noted.  DIAGNOSTIC FINDINGS:  No recent spine imaging in chart - Lumbar MRI 2023, refer to EPIC for details  PATIENT SURVEYS:  ODI: 15/50; 30%  08/28/24 ODI: 9/50; 18%   09/13/24 ODI: 3/50; 6%   COGNITION: Overall cognitive status: Within functional limits for tasks assessed     SENSATION/NEURO: Light touch intact BIL LE   No clonus either LE Negative hoffmann and tromner sign BIL No ataxia with gait   LUMBAR ROM:   AROM eval 08/28/24 09/13/24  Flexion Distal shin * Ankles * Ankles not bad   Extension 75% initially relieving, then painful 100%   Right lateral flexion     Left lateral flexion     Right rotation 75% s * WNL * WNL mild twitch  Left rotation WNL WNL  WNL   (Blank rows = not tested) (Key: WFL = within functional limits not formally assessed, * = concordant pain, s = stiffness/stretching sensation, NT = not tested) Comment:   LOWER EXTREMITY ROM:      Right eval Left eval  Hip flexion     Hip extension    Hip internal rotation    Hip external rotation    Knee extension    Knee flexion    (Blank rows = not tested) (Key: WFL = within functional limits not formally assessed, * = concordant pain, s = stiffness/stretching sensation, NT = not tested)  Comments:    LOWER EXTREMITY MMT:    MMT Right eval Left eval R/L 08/28/24   R/L 09/13/24  Hip flexion 4 4 4/4 4+/4+  Hip abduction (modified sitting) 4+ 4+ 4+/4+ 5/5  Hip internal rotation 4+ 4 4+/4+ 5/4+  Hip external rotation 4+ 4 4+/4+  5/5  Knee flexion 4 4- 4+/4- 5/4+  Knee extension 4+ 4+ 4+/4+ 5/+  Ankle dorsiflexion 5 5     (Blank rows = not tested) (Key: WFL = within functional limits not formally assessed, * = concordant pain, s = stiffness/stretching sensation, NT = not tested)  Comments:      FUNCTIONAL TESTS:  5xSTS: 17.35sec gentle UE support from thighs : 348ft no AD, back pain from 3/10 initially to5/10  08/09/24  - 330ft no AD, no increase in back pain (remains 2/10)  08/28/24: - 5xSTS 12.75sec gentle UE support from thighs - 386ft no AD, 3/10 pain, muscular fatigue     09/13/24: - 5xSTS 12.36sec - 315ft, no AD; 2/10 pain (baseline)   GAIT: Distance walked: within clinic Assistive device utilized: None Level of assistance: Complete Independence Comments: widened BOS, reduced truncal rotation and arm swing    TREATMENT:   [facility] Adult PT Treatment:                                                DATE: 09/13/24 Therapeutic Exercise: HEP handout, pt politely declines performance in clinic  Therapeutic Activity: MSK assessment + education + education 5xSTS + education Education/discussion re: progress with PT, symptom behavior as it affects activity tolerance, PT goals/POC, discharge education, follow up with providers as indicated Activity modification, walking program progression/modification as indicated   PATIENT EDUCATION:  Education details: PT POC, PT  goals, progress with PT thus far, discharge planning, HEP, follow up with provider as indicated Person educated: Patient Education method: Explanation, Demonstration, Verbal cues Education comprehension: verbalized understanding, returned demonstration   HOME EXERCISE PROGRAM: Access Code: [email or web address] URL: [email or web address] Date: 09/11/2024 Prepared by: Alm Jenny  Program Notes - with single arm row, please do split stance as done in clinic, please do not do single leg  Exercises - Standing 3-Way Leg Reach with Resistance at Ankles and Counter Support  - 2-3 x daily - 1 sets - 5 reps - Standing Anti-Rotation Press with Anchored Resistance  - 2-3 x daily - 1 sets - 8-10 reps - Single Arm Row on One Leg with Resistance  - 2-3 x daily - 1 sets - 8 reps - Seated Flexion Stretch with Swiss Ball  - 2-3 x daily - 1 sets - 8 reps  ASSESSMENT:  CLINICAL IMPRESSION:   09/13/2024: Pt arrives w/ baseline back pain, states she feels confident discharging today. 5xSTS much improved and no longer within fall risk category. MMT with some mild L knee/hip weakness compared to R but improved compared to eval and nonpainful. (322ft today) not quite at long term goal of 478ft but does not increase baseline pain and is improved compared to eval. She denies overt functional limitations d/t back and reports improved activity tolerance with reduced pain levels compared to start of care. States she feels confident discharging to independent HEP today, discharge education is provided and encouraging follow up with providers PRN. HEP handout, politely declines performance in clinic today as she states she feels confident performing at home. No adverse events, tolerates assessment well without increase in resting pain. Pt departs today's session in no acute distress, all voiced questions/concerns addressed appropriately from PT perspective.     Per eval: Patient is a very pleasant 73 y.o. woman  who was seen today for physical therapy  evaluation and treatment for chronic back pain. Endorses difficulty w/ repetitive tasks, household tasks, and prolonged ambulation. No red flags on exam today. Demonstrates concordant limitations with lumbar mobility and LE strength (particularly L hip). Fall risk and reduced functional mobility as indicated by 5xSTS time. Tolerates exam/HEP well overall, no adverse events. Notes relief w/ LTR and adduction iso. Recommend trial of skilled PT to address aforementioned deficits with aim of improving functional tolerance and reducing pain with typical activities. Pt departs today's session in no acute distress, all voiced concerns/questions addressed appropriately from PT perspective.    OBJECTIVE IMPAIRMENTS: Abnormal gait, decreased activity tolerance, decreased endurance, decreased mobility, difficulty walking, decreased ROM, decreased strength, impaired perceived functional ability, improper body mechanics, postural dysfunction, and pain.   ACTIVITY LIMITATIONS: carrying, lifting, bending, sitting, standing, squatting, transfers, and locomotion level  PARTICIPATION LIMITATIONS: meal prep, cleaning, laundry, and community activity  PERSONAL FACTORS: Age, Time since onset of injury/illness/exacerbation, and 3+ comorbidities: DM, HTN, thyroid  disease, CKD3 are also affecting patient's functional outcome.   REHAB POTENTIAL: Good  CLINICAL DECISION MAKING: Stable/uncomplicated  EVALUATION COMPLEXITY: Low   GOALS:  SHORT TERM GOALS: Target date: 08/16/2024  Pt will demonstrate appropriate understanding and performance of initially prescribed HEP in order to facilitate improved independence with management of symptoms.  Baseline: HEP established  08/14/24: reports good HEP performance Goal status: MET  2. Pt will report at least 25% improvement in overall pain levels over past week in order to facilitate improved tolerance to typical daily activities.    Baseline: 2-9/10 08/14/24: 0-4/10 Goal status: MET  LONG TERM GOALS: Target date: 09/13/2024   Pt will improve at least 15% on ODI in order to demonstrate improved perception of functional status due to symptoms.  Baseline: 30% 08/28/24: 9/50; 18%  09/13/24: 3/50; 6% Goal status: MET  2.  Pt will demonstrate symmetrical lumbar rotation AROM in order to demonstrate improved tolerance to functional movement patterns.  Baseline: see ROM chart above 08/28/24: symmetrical, painful  09/13/24: symmetrical, mild twitch R side Goal status: PARTIALLY MET  3.  Pt will demonstrate grossly symmetrical MMT of at least 4+/5 in tested groups in order to demonstrate improved strength for functional movements.  Baseline: see MMT chart above 08/28/24: see MMT chart above  09/13/24: see MMT chart above Goal status: PARTIALLY MET  4. Pt will perform 5xSTS in </=14 sec in order to demonstrate reduced fall risk and improved functional independence. (MCID of 2.3sec)  Baseline: 17sec gentle UE support  08/28/24: 12.75sec gentle UE support  09/13/24: 12.36sec no UE support  Goal status: MET  5. Pt will report at least 50% decrease in overall pain levels in past week in order to facilitate improved tolerance to basic ADLs/mobility.   Baseline: 2-9/10  08/28/24: 2-4/10  09/13/24: 0-3/10  Goal status: MET  6. Pt will improve to at least 462ft w/ less than 3/10 pain on NPS in order to indicate improved functional tolerance.   Baseline: 335ft no AD, 5/10 pain  08/28/24: 333ft no AD   09/13/24: 384ft no AD, baseline pain  Goal status: NEARLY/PARTIALLY MET  PLAN: (DISCHARGE 09/13/24)    Alm DELENA Jenny PT, DPT 09/13/2024 3:31 PM

## 2024-10-02 DIAGNOSIS — H35371 Puckering of macula, right eye: Secondary | ICD-10-CM | POA: Diagnosis not present

## 2024-10-02 LAB — OPHTHALMOLOGY REPORT-SCANNED

## 2024-10-04 ENCOUNTER — Other Ambulatory Visit: Payer: Self-pay | Admitting: Physician Assistant

## 2024-10-04 DIAGNOSIS — E039 Hypothyroidism, unspecified: Secondary | ICD-10-CM

## 2024-10-10 ENCOUNTER — Other Ambulatory Visit: Payer: Self-pay | Admitting: Family Medicine

## 2024-10-10 DIAGNOSIS — M47816 Spondylosis without myelopathy or radiculopathy, lumbar region: Secondary | ICD-10-CM

## 2024-10-10 DIAGNOSIS — I10 Essential (primary) hypertension: Secondary | ICD-10-CM

## 2024-10-11 ENCOUNTER — Encounter

## 2024-10-12 ENCOUNTER — Telehealth: Payer: Self-pay

## 2024-10-12 DIAGNOSIS — Z780 Asymptomatic menopausal state: Secondary | ICD-10-CM

## 2024-10-12 NOTE — Telephone Encounter (Signed)
Please let her know that the order has been placed

## 2024-10-12 NOTE — Telephone Encounter (Signed)
 Copied from CRM [id number]. Topic: Clinical - Request for Lab/Test Order >> Oct 12, 2024 11:57 AM Delon DASEN wrote: Reason for CRM: Patient requesting bone density scan- [phone]

## 2024-10-12 NOTE — Telephone Encounter (Signed)
 Patient advised.

## 2024-10-16 ENCOUNTER — Encounter: Payer: Self-pay | Admitting: Family Medicine

## 2024-10-16 ENCOUNTER — Ambulatory Visit: Admitting: Family Medicine

## 2024-10-16 VITALS — BP 124/57 | HR 67 | Ht 64.0 in | Wt 253.1 lb

## 2024-10-16 DIAGNOSIS — E1122 Type 2 diabetes mellitus with diabetic chronic kidney disease: Secondary | ICD-10-CM | POA: Diagnosis not present

## 2024-10-16 DIAGNOSIS — Z7985 Long-term (current) use of injectable non-insulin antidiabetic drugs: Secondary | ICD-10-CM

## 2024-10-16 DIAGNOSIS — Z6841 Body Mass Index (BMI) 40.0 and over, adult: Secondary | ICD-10-CM

## 2024-10-16 DIAGNOSIS — E785 Hyperlipidemia, unspecified: Secondary | ICD-10-CM | POA: Diagnosis not present

## 2024-10-16 DIAGNOSIS — I10 Essential (primary) hypertension: Secondary | ICD-10-CM | POA: Diagnosis not present

## 2024-10-16 DIAGNOSIS — E7800 Pure hypercholesterolemia, unspecified: Secondary | ICD-10-CM

## 2024-10-16 DIAGNOSIS — N1831 Chronic kidney disease, stage 3a: Secondary | ICD-10-CM

## 2024-10-16 LAB — POCT GLYCOSYLATED HEMOGLOBIN (HGB A1C): Hemoglobin A1C: 5.4 % (ref 4.0–5.6)

## 2024-10-16 MED ORDER — LISINOPRIL 5 MG PO TABS: 5.0000 mg | ORAL_TABLET | Freq: Every day | ORAL | 1 refills | Status: AC

## 2024-10-16 NOTE — Assessment & Plan Note (Signed)
 Essential Hypertension Blood pressure controlled at 124/57 mmHg with lisinopril . - Prescribed lisinopril  5 mg tablets.

## 2024-10-16 NOTE — Assessment & Plan Note (Signed)
 Down 4 more lbs. Doing well. Discussed having plan for HOliday around exercise and eating.

## 2024-10-16 NOTE — Assessment & Plan Note (Signed)
 Hypercholesterolemia Switched to rosuvastatin  to achieve LDL goal <100 mg/dL. No adverse effects reported. - Ordered blood work for cholesterol levels.

## 2024-10-16 NOTE — Assessment & Plan Note (Signed)
 Lab Results  Component Value Date   HGBA1C 5.4 10/16/2024   Looks awesome today!!! Continue mounjaro  12.5mg 

## 2024-10-16 NOTE — Progress Notes (Signed)
 Established Patient Office Visit  Patient ID: Regina Houston, female    DOB: 12-06-1950  Age: 73 y.o. MRN: [id number] PCP: Alvan Dorothyann BIRCH, MD  Chief Complaint  Patient presents with   Diabetes   Hypertension    Subjective:     HPI  Discussed the use of AI scribe software for clinical note transcription with the patient, who gave verbal consent to proceed.  History of Present Illness Regina Houston is a 73 year old female with hypertension and hyperlipidemia who presents for medication management and follow-up.  Hypertension - Blood pressure measured at 124/57 mmHg this morning after taking half a tablet of lisinopril . - Currently adjusting lisinopril  dose by splitting tablets; inquires about obtaining a 5 mg dose to avoid pill splitting.  Hyperlipidemia - A1c remains stable at 5.4. - Recently switched from simvastatin  to rosuvastatin  for cholesterol management. - No adverse effects from rosuvastatin .  Musculoskeletal stiffness and pain - Experiencing stiffness and difficulty with movement. - Improvement noted with physical therapy exercises. - Recent onset of mid-back pain on the right side after twisting while walking her dog; pain radiates slightly but is not directly over the spine. - Knee stiffness present, previously managed with gel injections which were effective for several years. - Increased knee stiffness with colder weather.  Colonoscopy preparation - Preparing for colonoscopy next Friday. - Medication and supplement intake adjusted in anticipation of procedure.     ROS    Objective:     BP (!) 124/57   Pulse 67   Ht 5' 4 (1.626 m)   Wt 253 lb 1.3 oz (114.8 kg)   SpO2 100%   BMI 43.44 kg/m    Physical Exam Vitals and nursing note reviewed.  Constitutional:      Appearance: Normal appearance.  HENT:     Head: Normocephalic and atraumatic.  Eyes:     Conjunctiva/sclera: Conjunctivae normal.  Cardiovascular:     Rate and Rhythm: Normal  rate and regular rhythm.  Pulmonary:     Effort: Pulmonary effort is normal.     Breath sounds: Normal breath sounds.  Skin:    General: Skin is warm and dry.  Neurological:     Mental Status: She is alert.  Psychiatric:        Mood and Affect: Mood normal.      Results for orders placed or performed in visit on 10/16/24  POCT HgB A1C  Result Value Ref Range   Hemoglobin A1C 5.4 4.0 - 5.6 %   HbA1c POC (<> result, manual entry)     HbA1c, POC (prediabetic range)     HbA1c, POC (controlled diabetic range)        The 10-year ASCVD risk score (Arnett DK, et al., 2019) is: 34.9%    Assessment & Plan:   Problem List Items Addressed This Visit       Cardiovascular and Mediastinum   Essential hypertension   Essential Hypertension Blood pressure controlled at 124/57 mmHg with lisinopril . - Prescribed lisinopril  5 mg tablets.      Relevant Medications   lisinopril  (ZESTRIL ) 5 MG tablet     Endocrine   Type 2 diabetes mellitus with stage 3a chronic kidney disease, without long-term current use of insulin (HCC) - Primary   Lab Results  Component Value Date   HGBA1C 5.4 10/16/2024   Looks awesome today!!! Continue mounjaro  12.5mg       Relevant Medications   lisinopril  (ZESTRIL ) 5 MG tablet   Other Relevant Orders   POCT HgB  A1C (Completed)     Other   Severe obesity (BMI >= 40) (HCC)   Down 4 more lbs. Doing well. Discussed having plan for HOliday around exercise and eating.       HLD (hyperlipidemia)   Hypercholesterolemia Switched to rosuvastatin  to achieve LDL goal <100 mg/dL. No adverse effects reported. - Ordered blood work for cholesterol levels.      Relevant Medications   lisinopril  (ZESTRIL ) 5 MG tablet    Assessment and Plan Assessment & Plan   Right mid back pain Pain likely from twisting injury, localized to right side, radiates slightly. Previous therapy beneficial. - Provided additional stretches. - Continue current exercise  regimen.  Bilateral knee osteoarthritis Previous gel injections effective for five years. Current stiffness possibly due to weather and age. - Consider repeat gel injections if symptoms worsen.  General Health Maintenance A1c stable at 5.4%. Bone density test and colonoscopy scheduled. - Proceed with scheduled tests.    Return in about 4 months (around 02/13/2025) for Diabetes follow-up.    Dorothyann Byars, MD [facility] at [facility]

## 2024-10-17 LAB — CMP14+EGFR
ALT: 20 IU/L (ref 0–32)
AST: 26 IU/L (ref 0–40)
Albumin: 4.4 g/dL (ref 3.8–4.8)
Alkaline Phosphatase: 90 IU/L (ref 49–135)
BUN/Creatinine Ratio: 25 (ref 12–28)
BUN: 34 mg/dL — ABNORMAL HIGH (ref 8–27)
Bilirubin Total: 0.4 mg/dL (ref 0.0–1.2)
CO2: 22 mmol/L (ref 20–29)
Calcium: 9.7 mg/dL (ref 8.7–10.3)
Chloride: 104 mmol/L (ref 96–106)
Creatinine, Ser: 1.36 mg/dL — ABNORMAL HIGH (ref 0.57–1.00)
Globulin, Total: 2 g/dL (ref 1.5–4.5)
Glucose: 89 mg/dL (ref 70–99)
Potassium: 4.7 mmol/L (ref 3.5–5.2)
Sodium: 140 mmol/L (ref 134–144)
Total Protein: 6.4 g/dL (ref 6.0–8.5)
eGFR: 41 mL/min/1.73 — ABNORMAL LOW (ref >59)

## 2024-10-17 LAB — LIPID PANEL
Chol/HDL Ratio: 2.1 ratio (ref 0.0–4.4)
Cholesterol, Total: 168 mg/dL (ref 100–199)
HDL: 79 mg/dL (ref >39)
LDL Chol Calc (NIH): 77 mg/dL (ref 0–99)
Triglycerides: 58 mg/dL (ref 0–149)
VLDL Cholesterol Cal: 12 mg/dL (ref 5–40)

## 2024-10-17 NOTE — Progress Notes (Signed)
 Hi Regina Houston, kidney function is a little better at 1.3 it had jumped up to 1.4 back in July.  You still look a little dry on your blood work but a little bit better hydrated.  So that is good.  Liver enzymes are normal.  Just keep working on increasing your fluid intake.  Your LDL looks much better it is down to 77.

## 2024-10-31 ENCOUNTER — Ambulatory Visit

## 2024-10-31 VITALS — BP 124/48 | HR 72 | Ht 64.0 in | Wt 257.0 lb

## 2024-10-31 DIAGNOSIS — Z0000 Encounter for general adult medical examination without abnormal findings: Secondary | ICD-10-CM | POA: Diagnosis not present

## 2024-10-31 NOTE — Patient Instructions (Signed)
 Ms. Veiga,  Thank you for taking the time for your Medicare Wellness Visit. I appreciate your continued commitment to your health goals. Please review the care plan we discussed, and feel free to reach out if I can assist you further.  Please note that Annual Wellness Visits do not include a physical exam. Some assessments may be limited, especially if the visit was conducted virtually. If needed, we may recommend an in-person follow-up with your provider.  Ongoing Care Seeing your primary care provider every 3 to 6 months helps us  monitor your health and provide consistent, personalized care.   Referrals If a referral was made during today's visit and you haven't received any updates within two weeks, please contact the referred provider directly to check on the status.  Recommended Screenings:  Health Maintenance  Topic Date Due   Osteoporosis screening with Bone Density Scan  07/24/2024   Colon Cancer Screening  09/30/2024   Medicare Annual Wellness Visit  10/03/2024   COVID-19 Vaccine (8 - Moderna risk 2025-26 season) 03/02/2025   Hemoglobin A1C  04/15/2025   Yearly kidney health urinalysis for diabetes  06/21/2025   Eye exam for diabetics  10/02/2025   Yearly kidney function blood test for diabetes  10/16/2025   Complete foot exam   10/16/2025   DTaP/Tdap/Td vaccine (4 - Td or Tdap) 02/23/2027   Pneumococcal Vaccine for age over 54  Completed   Flu Shot  Completed   Hepatitis C Screening  Completed   Zoster (Shingles) Vaccine  Completed   Meningitis B Vaccine  Aged Out   Breast Cancer Screening  Discontinued       10/31/2024    8:42 AM  Advanced Directives  Does Patient Have a Medical Advance Directive? Yes  Type of Advance Directive Living will;Healthcare Power of Attorney  Does patient want to make changes to medical advance directive? No - Patient declined    Vision: Annual vision screenings are recommended for early detection of glaucoma, cataracts, and diabetic  retinopathy. These exams can also reveal signs of chronic conditions such as diabetes and high blood pressure.  Dental: Annual dental screenings help detect early signs of oral cancer, gum disease, and other conditions linked to overall health, including heart disease and diabetes.  Please see the attached documents for additional preventive care recommendations.

## 2024-10-31 NOTE — Progress Notes (Signed)
 Chief Complaint  Patient presents with   Medicare Wellness     Subjective:   Pachia Strum is a 73 y.o. female who presents for a Medicare Annual Wellness Visit.  Visit info / Clinical Intake: Medicare Wellness Visit Type:: Subsequent Annual Wellness Visit Persons participating in visit and providing information:: patient Medicare Wellness Visit Mode:: In-person (required for WTM) Interpreter Needed?: No Pre-visit prep was completed: yes AWV questionnaire completed by patient prior to visit?: yes Date:: 10/27/24 Living arrangements:: (!) lives alone Patient's Overall Health Status Rating: very good Typical amount of pain: some Does pain affect daily life?: no Are you currently prescribed opioids?: no  Dietary Habits and Nutritional Risks How many meals a day?: 3 Eats fruit and vegetables daily?: yes Most meals are obtained by: preparing own meals In the last 2 weeks, have you had any of the following?: none Diabetic:: (!) yes Any non-healing wounds?: no How often do you check your BS?: 1 Would you like to be referred to a Nutritionist or for Diabetic Management? : no  Functional Status Activities of Daily Living (to include ambulation/medication): Independent Ambulation: Independent Medication Administration: Independent Home Management (perform basic housework or laundry): Independent Manage your own finances?: yes Primary transportation is: driving Concerns about vision?: no *vision screening is required for WTM* Concerns about hearing?: no  Fall Screening Falls in the past year?: 1 Number of falls in past year: 1 Was there an injury with Fall?: 0 Fall Risk Category Calculator: 2 Patient Fall Risk Level: Moderate Fall Risk  Fall Risk Patient at Risk for Falls Due to: Impaired balance/gait Fall risk Follow up: Falls evaluation completed  Home and Transportation Safety: All rugs have non-skid backing?: yes All stairs or steps have railings?: N/A, no  stairs Grab bars in the bathtub or shower?: yes Have non-skid surface in bathtub or shower?: yes Good home lighting?: yes Regular seat belt use?: yes Hospital stays in the last year:: no  Cognitive Assessment Difficulty concentrating, remembering, or making decisions? : no Will 6CIT or Mini Cog be Completed: yes What year is it?: 0 points What month is it?: 0 points Give patient an address phrase to remember (5 components): [address] [place], [place] [address] About what time is it?: 0 points Count backwards from 20 to 1: 0 points Say the months of the year in reverse: 0 points Repeat the address phrase from earlier: 0 points 6 CIT Score: 0 points  Advance Directives (For Healthcare) Does Patient Have a Medical Advance Directive?: Yes Does patient want to make changes to medical advance directive?: No - Patient declined Type of Advance Directive: Living will; Healthcare Power of Attorney  Reviewed/Updated  Reviewed/Updated: Reviewed All (Medical, Surgical, Family, Medications, Allergies, Care Teams, Patient Goals)    Allergies (verified) Latex and Tape   Current Medications (verified) Outpatient Encounter Medications as of 10/31/2024  Medication Sig   AMBULATORY NON FORMULARY MEDICATION Medication Name: Glucometer.  STrips and lancets to test twice daily.   Dx 250.00.   calcium  carbonate (OS-CAL) 600 MG TABS Take 600 mg by mouth daily.   celecoxib  (CELEBREX ) 200 MG capsule TAKE 1 TO 2 CAPSULES BY MOUTH ONCE DAILY AS NEEDED FOR PAIN   fish oil-omega-3 fatty acids 1000 MG capsule Take 2 g by mouth daily.   furosemide  (LASIX ) 20 MG tablet Take 1 tablet by mouth once daily   glucose blood (ONETOUCH ULTRA) test strip USE 1 STRIP TO CHECK GLUCOSE ONCE DAILY   levothyroxine  (SYNTHROID ) 100 MCG tablet Take  1 tablet by mouth once daily   lisinopril  (ZESTRIL ) 5 MG tablet Take 1 tablet (5 mg total) by mouth daily.   Misc Natural Products (APPLE CIDER VINEGAR DIET) TABS Take 1 tablet  by mouth daily.   Multiple Vitamin (THERA) TABS Take 1 tablet by mouth daily.   pregabalin  (LYRICA ) 75 MG capsule TAKE 1 CAPSULE BY MOUTH THREE TIMES DAILY   rosuvastatin  (CRESTOR ) 20 MG tablet Take 1 tablet (20 mg total) by mouth at bedtime.   tirzepatide  (MOUNJARO ) 12.5 MG/0.5ML Pen Inject 12.5 mg into the skin once a week.   No facility-administered encounter medications on file as of 10/31/2024.    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,benign menigioma   Capsulotomy MPJ Right 12/31/2016   RT FOOT, 3rd MPJ   FOOT SURGERY  2004   hammer toe   Hammer Toe Repair Right 12/31/2016   RT #3, #5 toes   Family History  Problem Relation Age of Onset   Hypertension Mother    Stroke Mother    Diabetes Mother    Heart disease Mother 9   Arthritis Mother    Kidney disease Father        kidney failure   Heart disease Father 67   Heart disease Maternal Grandmother        MI   Diabetes Maternal Grandmother    Diabetes Paternal Grandmother    Heart disease Paternal Grandmother    Social History   Occupational History   Occupation: retired    Comment: [employer]  Tobacco Use   Smoking status: Never   Smokeless tobacco: Never  Vaping Use   Vaping status: Never Used  Substance and Sexual Activity   Alcohol use: No   Drug use: No   Sexual activity: Not Currently    Birth control/protection: None   Tobacco Counseling Counseling given: Not Answered  SDOH Screenings   Food Insecurity: No Food Insecurity (10/31/2024)  Housing: Low Risk  (10/31/2024)  Transportation Needs: No Transportation  Needs (10/31/2024)  Utilities: Not At Risk (10/31/2024)  Alcohol Screen: Low Risk  (10/04/2024)  Depression (PHQ2-9): Low Risk  (10/31/2024)  Financial Resource Strain: Low Risk  (10/27/2024)  Physical Activity: Sufficiently Active (10/31/2024)  Social Connections: Moderately Integrated (10/31/2024)  Stress: No Stress Concern Present (10/31/2024)  Tobacco Use: Low Risk  (10/31/2024)  Health Literacy: Adequate Health Literacy (10/31/2024)   See flowsheets for full screening details  Depression Screen PHQ 2 & 9 Depression Scale- Over the past 2 weeks, how often have you been bothered by any of the following problems? Little interest or pleasure in doing things: 0 Feeling down, depressed, or hopeless (PHQ Adolescent also includes...irritable): 0 PHQ-2 Total Score: 0 Trouble falling or staying asleep, or sleeping too much: 0 Feeling tired or having little energy: 1 Poor appetite or overeating (PHQ Adolescent also includes...weight loss): 0 Feeling bad about yourself - or that you are a failure or have let yourself or your family down: 0 Trouble concentrating on things, such as  reading the newspaper or watching television [facility] Adolescent also includes...like school work): 0 Moving or speaking so slowly that other people could have noticed. Or the opposite - being so fidgety or restless that you have been moving around a lot more than usual: 0 Thoughts that you would be better off dead, or of hurting yourself in some way: 0 PHQ-9 Total Score: 1 If you checked off any problems, how difficult have these problems made it for you to do your work, take care of things at home, or get along with other people?: Not difficult at all  Depression Treatment Depression Interventions/Treatment : [id number] Score <4 Follow-up Not Indicated     Goals Addressed             This Visit's Progress    Patient Stated       Patient states she would like to lose weight.              Objective:     Today's Vitals   10/31/24 0837  BP: (!) 124/48  Pulse: 72  SpO2: 100%  Weight: 257 lb (116.6 kg)  Height: 5' 4 (1.626 m)   Body mass index is 44.11 kg/m.  Hearing/Vision screen No results found. Immunizations and Health Maintenance Health Maintenance  Topic Date Due   Bone Density Scan  07/24/2024   Colonoscopy  09/30/2024   COVID-19 Vaccine (8 - Moderna risk 2025-26 season) 03/02/2025   HEMOGLOBIN A1C  04/15/2025   Diabetic kidney evaluation - Urine ACR  06/21/2025   OPHTHALMOLOGY EXAM  10/02/2025   Diabetic kidney evaluation - eGFR measurement  10/16/2025   FOOT EXAM  10/16/2025   Medicare Annual Wellness (AWV)  10/31/2025   DTaP/Tdap/Td (4 - Td or Tdap) 02/23/2027   Pneumococcal Vaccine: 50+ Years  Completed   Influenza Vaccine  Completed   Hepatitis C Screening  Completed   Zoster Vaccines- Shingrix  Completed   Meningococcal B Vaccine  Aged Out   Mammogram  Discontinued        Assessment/Plan:  This is a routine wellness examination for Carlisa.  Patient Care Team: Alvan Dorothyann BIRCH, MD as PCP - General Beverli Cara PARAS, [facility] (Inactive) as Pharmacist (Pharmacist)  I have personally reviewed and noted the following in the patients chart:   Medical and social history Use of alcohol, tobacco or illicit drugs  Current medications and supplements including opioid prescriptions. Functional ability and status Nutritional status Physical activity Advanced directives List of other physicians Hospitalizations, surgeries, and ER visits in previous 12 months Vitals Screenings to include cognitive, depression, and falls Referrals and appointments  No orders of the defined types were placed in this encounter.  In addition, I have reviewed and discussed with patient certain preventive protocols, quality metrics, and best practice recommendations. A written personalized care plan for preventive services as well as general preventive health recommendations were  provided to patient.   Bonny Jon Mayor, CMA   10/31/2024   Return in 1 year (on 10/31/2025).  After Visit Summary: (In Person-Declined) Patient declined AVS at this time.  Nurse Notes:   Jahaira Earnhart is a 73 y.o. female patient of Metheney, Dorothyann BIRCH, MD who had a Medicare Annual Wellness Visit today via telephone. Nicolas is Retired and lives alone. She does not have any children. She reports that she is socially active and does interact with friends/family regularly. She is minimally physically active and enjoys doing crossword puzzles and playing the piano.

## 2024-11-17 ENCOUNTER — Other Ambulatory Visit: Payer: Self-pay | Admitting: Family Medicine

## 2024-11-20 ENCOUNTER — Other Ambulatory Visit: Payer: Self-pay | Admitting: Family Medicine

## 2024-12-03 ENCOUNTER — Other Ambulatory Visit: Payer: Self-pay | Admitting: Family Medicine

## 2024-12-26 ENCOUNTER — Ambulatory Visit

## 2024-12-26 ENCOUNTER — Other Ambulatory Visit: Payer: Self-pay | Admitting: Family Medicine

## 2024-12-26 DIAGNOSIS — Z780 Asymptomatic menopausal state: Secondary | ICD-10-CM

## 2024-12-26 DIAGNOSIS — Z1231 Encounter for screening mammogram for malignant neoplasm of breast: Secondary | ICD-10-CM

## 2024-12-27 ENCOUNTER — Ambulatory Visit: Payer: Self-pay | Admitting: Family Medicine

## 2024-12-27 NOTE — Progress Notes (Signed)
 Hi Regina Houston, your T-score is -1.4.  This is in the osteopenia range.   The current recommendation for osteopenia (mildly thin bones) treatment includes:   #1 calcium -total of 1200 mg of calcium  daily.  If you eat a very calcium  rich diet you may be able to obtain that without a supplement.  If not, then I recommend calcium  500 mg twice a day.  There are several products over-the-counter such as Caltrate D and Viactiv chews which are great options that contain calcium  and vitamin D . #2 vitamin D -recommend 800 international units daily. #3 exercise-recommend 30 minutes of weightbearing exercise 3 days a week.  Resistance training ,such as doing bands and light weights, can be particularly helpful.

## 2025-02-01 ENCOUNTER — Ambulatory Visit

## 2025-02-13 ENCOUNTER — Ambulatory Visit: Admitting: Family Medicine

## 2025-11-05 ENCOUNTER — Ambulatory Visit
